# Patient Record
Sex: Male | Born: 1950
Health system: Southern US, Community
[De-identification: ages and names within clinical notes are randomized; demographics above are authoritative.]

## PROBLEM LIST (undated history)

## (undated) DIAGNOSIS — E785 Hyperlipidemia, unspecified: Secondary | ICD-10-CM

## (undated) DIAGNOSIS — G4733 Obstructive sleep apnea (adult) (pediatric): Secondary | ICD-10-CM

## (undated) DIAGNOSIS — Z9989 Dependence on other enabling machines and devices: Secondary | ICD-10-CM

## (undated) DIAGNOSIS — M5030 Other cervical disc degeneration, unspecified cervical region: Secondary | ICD-10-CM

## (undated) DIAGNOSIS — R739 Hyperglycemia, unspecified: Secondary | ICD-10-CM

## (undated) DIAGNOSIS — M1990 Unspecified osteoarthritis, unspecified site: Secondary | ICD-10-CM

## (undated) DIAGNOSIS — M48061 Spinal stenosis, lumbar region without neurogenic claudication: Secondary | ICD-10-CM

## (undated) DIAGNOSIS — I10 Essential (primary) hypertension: Secondary | ICD-10-CM

## (undated) DIAGNOSIS — R7303 Prediabetes: Secondary | ICD-10-CM

## (undated) HISTORY — DX: Unspecified osteoarthritis, unspecified site: M19.90

## (undated) HISTORY — DX: Hyperglycemia, unspecified: R73.9

## (undated) HISTORY — DX: Essential (primary) hypertension: I10

## (undated) HISTORY — DX: Spinal stenosis, lumbar region without neurogenic claudication: M48.061

## (undated) HISTORY — DX: Obstructive sleep apnea (adult) (pediatric): G47.33

## (undated) HISTORY — DX: Obstructive sleep apnea (adult) (pediatric): Z99.89

## (undated) HISTORY — DX: Hyperlipidemia, unspecified: E78.5

## (undated) HISTORY — DX: Other cervical disc degeneration, unspecified cervical region: M50.30

---
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
## 1995-08-17 HISTORY — PX: CT HEAD LIMITED W/CM: HXRAD128

## 1995-08-17 HISTORY — PX: OTHER SURGICAL HISTORY: SHX169

## 1996-08-16 HISTORY — PX: ESOPHAGOGASTRODUODENOSCOPY: SHX1529

## 1996-08-16 HISTORY — PX: COLONOSCOPY: SHX174

## 1998-08-16 HISTORY — PX: OTHER SURGICAL HISTORY: SHX169

## 2001-07-22 ENCOUNTER — Emergency Department (HOSPITAL_COMMUNITY): Admission: EM | Admit: 2001-07-22 | Discharge: 2001-07-22 | Payer: Self-pay | Admitting: Emergency Medicine

## 2004-04-16 ENCOUNTER — Encounter: Payer: Self-pay | Admitting: Family Medicine

## 2004-04-16 DIAGNOSIS — E118 Type 2 diabetes mellitus with unspecified complications: Secondary | ICD-10-CM

## 2004-04-16 DIAGNOSIS — R7303 Prediabetes: Secondary | ICD-10-CM | POA: Insufficient documentation

## 2004-04-16 DIAGNOSIS — E1169 Type 2 diabetes mellitus with other specified complication: Secondary | ICD-10-CM | POA: Insufficient documentation

## 2004-04-16 LAB — CONVERTED CEMR LAB: PSA: 0.5 ng/mL

## 2004-06-19 ENCOUNTER — Ambulatory Visit: Payer: Self-pay | Admitting: Family Medicine

## 2004-06-23 ENCOUNTER — Ambulatory Visit: Payer: Self-pay | Admitting: Family Medicine

## 2005-09-02 ENCOUNTER — Ambulatory Visit: Payer: Self-pay | Admitting: Family Medicine

## 2006-02-02 ENCOUNTER — Ambulatory Visit: Payer: Self-pay | Admitting: Family Medicine

## 2007-04-25 ENCOUNTER — Ambulatory Visit: Payer: Self-pay | Admitting: Family Medicine

## 2007-04-25 DIAGNOSIS — I10 Essential (primary) hypertension: Secondary | ICD-10-CM | POA: Insufficient documentation

## 2007-05-03 ENCOUNTER — Encounter: Payer: Self-pay | Admitting: Family Medicine

## 2007-05-04 DIAGNOSIS — R42 Dizziness and giddiness: Secondary | ICD-10-CM | POA: Insufficient documentation

## 2007-06-01 ENCOUNTER — Ambulatory Visit: Payer: Self-pay | Admitting: Family Medicine

## 2007-11-23 ENCOUNTER — Ambulatory Visit: Payer: Self-pay | Admitting: Family Medicine

## 2007-11-23 LAB — CONVERTED CEMR LAB
ALT: 36 units/L (ref 0–53)
AST: 28 units/L (ref 0–37)
Alkaline Phosphatase: 56 units/L (ref 39–117)
Basophils Absolute: 0 10*3/uL (ref 0.0–0.1)
Basophils Relative: 0 % (ref 0.0–1.0)
Bilirubin, Direct: 0.1 mg/dL (ref 0.0–0.3)
CO2: 30 meq/L (ref 19–32)
Chloride: 107 meq/L (ref 96–112)
Cholesterol: 170 mg/dL (ref 0–200)
LDL Cholesterol: 121 mg/dL — ABNORMAL HIGH (ref 0–99)
Lymphocytes Relative: 30.5 % (ref 12.0–46.0)
MCHC: 33.4 g/dL (ref 30.0–36.0)
Neutrophils Relative %: 51.7 % (ref 43.0–77.0)
Potassium: 4.3 meq/L (ref 3.5–5.1)
RBC: 4.65 M/uL (ref 4.22–5.81)
RDW: 12.5 % (ref 11.5–14.6)
Sodium: 142 meq/L (ref 135–145)
Total Bilirubin: 0.8 mg/dL (ref 0.3–1.2)
VLDL: 23 mg/dL (ref 0–40)

## 2007-11-27 ENCOUNTER — Ambulatory Visit: Payer: Self-pay | Admitting: Family Medicine

## 2008-05-02 ENCOUNTER — Ambulatory Visit: Payer: Self-pay | Admitting: Family Medicine

## 2008-09-03 ENCOUNTER — Ambulatory Visit: Payer: Self-pay | Admitting: Family Medicine

## 2008-09-03 DIAGNOSIS — M25579 Pain in unspecified ankle and joints of unspecified foot: Secondary | ICD-10-CM | POA: Insufficient documentation

## 2008-11-18 ENCOUNTER — Emergency Department (HOSPITAL_COMMUNITY): Admission: EM | Admit: 2008-11-18 | Discharge: 2008-11-18 | Payer: Self-pay | Admitting: Emergency Medicine

## 2008-11-18 ENCOUNTER — Encounter: Payer: Self-pay | Admitting: Family Medicine

## 2008-11-19 ENCOUNTER — Ambulatory Visit: Payer: Self-pay | Admitting: Family Medicine

## 2008-11-22 ENCOUNTER — Telehealth: Payer: Self-pay | Admitting: Family Medicine

## 2008-11-25 ENCOUNTER — Ambulatory Visit: Payer: Self-pay | Admitting: Family Medicine

## 2008-11-27 ENCOUNTER — Telehealth: Payer: Self-pay | Admitting: Family Medicine

## 2008-11-28 ENCOUNTER — Ambulatory Visit: Payer: Self-pay | Admitting: Family Medicine

## 2008-11-28 LAB — CONVERTED CEMR LAB: HDL goal, serum: 40 mg/dL

## 2008-12-03 ENCOUNTER — Encounter: Payer: Self-pay | Admitting: Family Medicine

## 2009-01-07 ENCOUNTER — Telehealth: Payer: Self-pay | Admitting: Family Medicine

## 2009-01-20 ENCOUNTER — Encounter: Admission: RE | Admit: 2009-01-20 | Discharge: 2009-04-20 | Payer: Self-pay | Admitting: Neurology

## 2009-03-03 ENCOUNTER — Ambulatory Visit: Payer: Self-pay | Admitting: Family Medicine

## 2009-04-01 ENCOUNTER — Encounter: Payer: Self-pay | Admitting: Family Medicine

## 2009-09-24 ENCOUNTER — Ambulatory Visit: Payer: Self-pay | Admitting: Family Medicine

## 2009-09-24 DIAGNOSIS — M109 Gout, unspecified: Secondary | ICD-10-CM | POA: Insufficient documentation

## 2009-09-26 ENCOUNTER — Encounter: Payer: Self-pay | Admitting: Family Medicine

## 2010-01-22 ENCOUNTER — Telehealth: Payer: Self-pay | Admitting: Family Medicine
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
## 2010-04-22 ENCOUNTER — Ambulatory Visit (HOSPITAL_BASED_OUTPATIENT_CLINIC_OR_DEPARTMENT_OTHER): Admission: RE | Admit: 2010-04-22 | Discharge: 2010-04-22 | Payer: Self-pay | Admitting: Otolaryngology

## 2010-04-25 ENCOUNTER — Ambulatory Visit: Payer: Self-pay | Admitting: Internal Medicine

## 2010-05-12 ENCOUNTER — Ambulatory Visit: Payer: Self-pay | Admitting: Internal Medicine

## 2010-05-12 DIAGNOSIS — R609 Edema, unspecified: Secondary | ICD-10-CM | POA: Insufficient documentation

## 2010-05-25 ENCOUNTER — Ambulatory Visit: Payer: Self-pay | Admitting: Internal Medicine

## 2010-05-25 ENCOUNTER — Encounter: Payer: Self-pay | Admitting: Family Medicine

## 2010-05-25 LAB — CONVERTED CEMR LAB
ALT: 40 units/L (ref 0–53)
Alkaline Phosphatase: 60 units/L (ref 39–117)
Bilirubin, Direct: 0.1 mg/dL (ref 0.0–0.3)
CO2: 28 meq/L (ref 19–32)
Chloride: 103 meq/L (ref 96–112)
Direct LDL: 108.4 mg/dL
Sodium: 140 meq/L (ref 135–145)
Total Bilirubin: 0.8 mg/dL (ref 0.3–1.2)
Total CHOL/HDL Ratio: 7
Total Protein: 6.7 g/dL (ref 6.0–8.3)
Triglycerides: 218 mg/dL — ABNORMAL HIGH (ref 0.0–149.0)
VLDL: 43.6 mg/dL — ABNORMAL HIGH (ref 0.0–40.0)

## 2010-05-26 ENCOUNTER — Ambulatory Visit: Payer: Self-pay | Admitting: Family Medicine

## 2010-06-15 ENCOUNTER — Ambulatory Visit: Payer: Self-pay | Admitting: Family Medicine

## 2010-06-15 ENCOUNTER — Telehealth: Payer: Self-pay | Admitting: Family Medicine

## 2010-06-15 LAB — CONVERTED CEMR LAB
Basophils Absolute: 0 10*3/uL (ref 0.0–0.1)
Basophils Relative: 0.5 % (ref 0.0–3.0)
Eosinophils Absolute: 0.2 10*3/uL (ref 0.0–0.7)
Lymphocytes Relative: 21.6 % (ref 12.0–46.0)
MCHC: 34.7 g/dL (ref 30.0–36.0)
Monocytes Absolute: 0.8 10*3/uL (ref 0.1–1.0)
Neutrophils Relative %: 62.3 % (ref 43.0–77.0)
Platelets: 190 10*3/uL (ref 150.0–400.0)
RBC: 4.39 M/uL (ref 4.22–5.81)
Uric Acid, Serum: 9.7 mg/dL — ABNORMAL HIGH (ref 4.0–7.8)
WBC: 6.8 10*3/uL (ref 4.5–10.5)

## 2010-06-16 HISTORY — PX: COLONOSCOPY: SHX174

## 2010-06-19 ENCOUNTER — Telehealth: Payer: Self-pay | Admitting: Family Medicine

## 2010-06-25 ENCOUNTER — Encounter: Payer: Self-pay | Admitting: Internal Medicine

## 2010-06-26 ENCOUNTER — Encounter (INDEPENDENT_AMBULATORY_CARE_PROVIDER_SITE_OTHER): Payer: Self-pay

## 2010-06-30 ENCOUNTER — Ambulatory Visit: Payer: Self-pay | Admitting: Internal Medicine

## 2010-07-14 ENCOUNTER — Ambulatory Visit: Payer: Self-pay | Admitting: Internal Medicine

## 2010-07-14 LAB — HM COLONOSCOPY

## 2010-07-27 ENCOUNTER — Telehealth: Payer: Self-pay | Admitting: Family Medicine
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
## 2010-09-15 NOTE — Progress Notes (Signed)
Summary: colcrys is causing diarrhea  Phone Note Call from Patient   Caller: Patient Call For:   Summary of Call: Pt states he has had diarrhea for the last few days, thinks caused by colcrys.  He has not had this in last 2 days but was up 4 times last night with diarrhea.  Please advise on what he should do.  Gout flare up is better, but still there.  [facility]. Initial call taken by: Lowella Petties CMA, AAMA,  June 19, 2010 10:57 AM  Follow-up for Phone Call        likely from colchicine - can cause GI upset.  would recommend back off.  stop colchicine if able to, otherwise if still with gout flare would recommend back down to daily colchicine.  could try immodium for diarrhea as long as no blood.  back off colchicine.  ensure stays well hydrated.  called and left message to call us back. Follow-up by: [name]  MD,  June 19, 2010 11:07 AM  Additional Follow-up for Phone Call Additional follow up Details #1::        Spoke with patient. He will back down to 1 pill once daily over the weekend due to still having some redness and swelling and then stop altogether on Monday. He was instructed to take immodium if needed and to stay well hydrated. He said he has not noticed any blood in stool.  Additional Follow-up by: [name] CMA Duncan Dull),  June 19, 2010 3:13 PM

## 2010-09-15 NOTE — Consult Note (Signed)
Summary: Ronald Bullock,DPM,[facility],Note  Ronald Bullock,DPM,[facility],Note   Imported By: Beau Fanny 10/01/2009 16:12:46  _____________________________________________________________________  External Attachment:    Type:   Image     Comment:   External Document
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
## 2010-09-15 NOTE — Assessment & Plan Note (Signed)
Summary: HURT FOOT, BAD PAIN/JRR   Vital Signs:  Patient profile:   60 year old male Weight:      250.50 pounds Temp:     97.8 degrees F oral Pulse rate:   88 / minute Pulse rhythm:   regular BP sitting:   134 / 70  (left arm) Cuff size:   large  Vitals Entered By: Selena Batten Dance CMA (AAMA) (June 15, 2010 9:02 AM) CC: Left foot pain   History of Present Illness: CC: L foot pain  3wk h/o L foot pain.  Sore all over.  pain concentrated at head of 1st MT.  + severe pain even to touch, heat, swelling.  Voltaren gel sometimes helps, not often.  Elevating leg doesn't help.  Last night was first night couldn't sleep.  When walking, walks on heel or side of foot, this causing knee and hip to hurt as well.  taking advil which helps some but ankles swell.  Tylenol doesn't help.  Denies injury, trauma.  no fevers/chills.  Has had foot problems in past - needed injections in both feet in past by podiatrist in [place] and another in [place].  Injections helped.  Saw Dr. Patsy Lager who [facility] feet and told bursitis in past, prescribed voltaren gel.  h/o gout 12 years ago.  In R hand and R foot.  h/o bad feet.  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  no ankle pain.  + loss of longitudinal arch, some loss of  transverse arch, 2nd toe very straight and stiff Pulses:  2+ DP/PT pulses Extremities:  minimal edema at ankles   Impression & Recommendations:  Problem # 1:  FOOT PAIN, LEFT, PLANTAR MTP AREA (ICD-729.5) suspicious for podagra although duration of pain points against this.  treat as such with colchicine and NSAIDs, ice, check uric acid level, CBC.  RTC 1 wk, sooner if not improving.  To call if continued pain despite colchicine, would consider vicodin script and referral to podiatry.  Orders: TLB-Uric Acid, Blood (84550-URIC) TLB-CBC Platelet - w/Differential (85025-CBCD)  Complete Medication List: 1)  Omega 3-6-9/fish Oil/flax Seed 1200 Mg  .... 2 by mouth once daily 2)  Tylenol Extra Strength 500 Mg Tabs (Acetaminophen) .... 2 by mouth three times a day 3)  Ibuprofen 800 Mg Tabs (Ibuprofen) .... Take one three times a day x 3 days then as needed 4)  Colcrys 0.6 Mg Tabs (Colchicine) .... Take as directed, may take 2 at first  Patient Instructions: 1)  For foot - ice foot at leat 3 times a day, max 20 min a day. 2)  Take colchicine 2 pills first then one every 6 hours as needed for pain. 3)  Take ibuprofen 800mg  tid for next few days then as needed.  continue voltaren.  4)  Return in 1-2 weeks for  follow up.  call us sooner if not helping. Prescriptions: COLCRYS 0.6 MG TABS (COLCHICINE) take as directed, may take 2 at first  #30 x 0   Entered and Authorized by:   [name]  MD   Signed by:   [name]  MD on 06/15/2010   Method used:   [facility]        [id number]       [address]       [place], [place]  [address]       Ph: [phone] or [phone]       Fax: [phone]   RxID:   [id number] IBUPROFEN 800 MG TABS (IBUPROFEN) take one three times a day x 3 days then as needed  #30 x 0   Entered and Authorized by:   [name]  MD   Signed by:   [name]  MD on 06/15/2010   Method used:   [facility]         [id number]       [address]       [place], [place]  [address]       Ph: [phone] or [phone]       Fax: [phone]   RxID:   [id number]    Orders Added: 1)  TLB-Uric Acid, Blood [84550-URIC] 2)  TLB-CBC Platelet - w/Differential [85025-CBCD] 3)  Est. Patient Level III [42706]    Current Allergies (reviewed today): No known allergies

## 2010-09-15 NOTE — Assessment & Plan Note (Signed)
Summary: CPX/W LABS/[facility]   Vital Signs:  Patient profile:   60 year old male Weight:      246.75 pounds Temp:     98.4 degrees F oral Pulse rate:   78 / minute Pulse rhythm:   regular BP sitting:   130 / 80  (left arm) Cuff size:   large  Vitals Entered By: Selena Batten Dance CMA Duncan Dull) (May 25, 2010 8:32 AM) CC: CPx, Lipid Management   History of Present Illness: CC: CPE  ankle swelling improving when changed from ibuprofen/advil to tylenol.  hip pain and knee pain not as well controlled on tylenol.  h/o arthritis in past, mainly in hip and knee.  to get replacement, awaiting for time and mony for surgery, also considering changing ortho - currently sees Dr. Yisroel Ramming.  declines other pain med for the time being.  down 9 lbs since last visit since change off ibuprofen and watching salt, more water.  had sleep study, found to have sleep apnea.  to have company come home to do mask fitting.  UTD tetanus, declines flu.  requests iFOB today.  s/p colonoscopy  ~1998.  prostate WNL, last checked 2009, strong stream, no nocturia.  Lipid Management History:      Positive NCEP/ATP III risk factors include male age 36 years old or older and HDL cholesterol less than 40.  Negative NCEP/ATP III risk factors include non-diabetic, no family history for ischemic heart disease, non-tobacco-user status, non-hypertensive, no ASHD (atherosclerotic heart disease), no prior stroke/TIA, no peripheral vascular disease, and no history of aortic aneurysm.    -  Date:  11/18/2008    TD booster Td  Current Medications (verified): 1)  Omega 3-6-9/fish Oil/flax Seed 1200 Mg .... 2 By Mouth Once Daily 2)  Tylenol Extra Strength 500 Mg Tabs (Acetaminophen) .... 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 ( Lives with wife)  4 children  Past Medical History: Hyperlipidemia (low HDL) bad arthritis (L knee cartilage gone) (cracked R hip 2006) OSA  Past Surgical History: arthroscopy L knee, cartilage removal 1968 episodes of dizzyness/lightheadedness 2/97 episodes CP nml cardiolyte head CT nml 4/97 cardiolite nml x/ increased BP 6/97 carotid US nml 9/97 endoscopy gastric polyps benign 9/98 colonoscopy nml 9/98 MRI l/s mild bulge L3-4, mild foraminal narrowing 4-5, L5-S1small disc herniation 05/14/99 (x-rays knees R) hip L/S degen changes throughout 02/02/06 PMH-FH-SH reviewed for relevance  Social History: No smoking, rare EtOH, no rec drugs Occupation: Music therapist, [employer]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  no bruits Lungs:  Normal respiratory effort, chest expands symmetrically. Lungs are clear to auscultation, no crackles or wheezes. Heart:  Normal rate and regular rhythm. S1 and S2 normal without gallop, murmur, click, rub or other extra sounds. Abdomen:  Bowel sounds positive,abdomen soft and non-tender without masses, organomegaly or hernias noted. Rectal:  No external abnormalities noted. Normal sphincter tone. No rectal masses or tenderness.  guaiac neg Prostate:  Prostate gland firm and smooth, no enlargement, nodularity, tenderness, mass, asymmetry or induration. 30gm Pulses:  2+ radial  pulses Extremities:  improved edema, still mild pitting   Impression & Recommendations:  Problem # 1:  HEALTH MAINTENANCE EXAM (ICD-V70.0) Reviewed preventive care protocols, scheduled due services, and updated immunizations.  declines flu shot.  utd tetanus.  iFOB and  PSA today.  Problem # 2:  SPECIAL SCREENING MALIGNANT NEOPLASM OF PROSTATE (ICD-V76.44) DRE reassuring, await PSA.  Orders: TLB-PSA (Prostate Specific Antigen) (84153-PSA)  Problem # 3:  HYPERLIPIDEMIA (ICD-272.4) goal LDL would be 130.  recheck.  h/o low HDL.  on fish oil.  Orders: TLB-BMP (Basic Metabolic Panel-BMET) (80048-METABOL) TLB-Hepatic/Liver Function Pnl (80076-HEPATIC) TLB-Lipid Panel (80061-LIPID)  Labs Reviewed: SGOT: 28 (11/23/2007)   SGPT: 36 (11/23/2007)  Lipid Goals: Chol Goal: 200 (11/28/2008)   HDL Goal: 40 (11/28/2008)   LDL Goal: 130 (11/28/2008)   TG Goal: 150 (11/28/2008)  Prior 10 Yr Risk Heart Disease: 18 % (11/28/2008)   HDL:26.3 (11/23/2007)  LDL:121 (11/23/2007)  Chol:170 (11/23/2007)  Trig:116 (11/23/2007)  Problem # 4:  HYPERGLYCEMIA (ICD-790.29) check glu fasting today.  stable.  Labs Reviewed: Creat: 1.2 (11/23/2007)     Problem # 5:  PERIPHERAL EDEMA (ICD-782.3) improved since decreased salt, actually lost 9 lbs.  congratulated.  Problem # 6:  ELEVATED BLOOD PRESSURE WITHOUT DIAGNOSIS OF HYPERTENSION (ICD-796.2) improved since off NSAIDs and watching salt.  BP today: 130/80 Prior BP: 142/80 (05/12/2010)  Prior 10 Yr Risk Heart Disease: 18 % (11/28/2008)  Labs Reviewed: Creat: 1.2 (11/23/2007) Chol: 170 (11/23/2007)   HDL: 26.3 (11/23/2007)   LDL: 121 (11/23/2007)   TG: 116 (11/23/2007)  Instructed in low sodium diet (DASH Handout) and behavior modification.    Complete Medication List: 1)  Omega 3-6-9/fish Oil/flax Seed 1200 Mg  .... 2 by mouth once daily 2)  Tylenol Extra Strength 500 Mg Tabs (Acetaminophen) .... 2 by mouth three times a day  Other Orders: Hemoccult Guaiac-1 spec.(in office) (82270)  Lipid Assessment/Plan:      Based on NCEP/ATP III, the patient's risk factor category is "2 or more risk factors and a calculated 10 year CAD risk of < 20%".  The patient's lipid goals are as follows: Total cholesterol goal is  200; LDL cholesterol goal is 130; HDL cholesterol goal is 40; Triglyceride goal is 150.  His LDL cholesterol goal has been met.     Patient Instructions: 1)  stool study sent home today. 2)  blood work today. 3)  Call clinic with questions.  Good to see you today.  Current Allergies (reviewed today): No known allergies    Prevention & Chronic Care Immunizations   Influenza vaccine: Not documented   Influenza vaccine deferral: Refused  (05/25/2010)    Tetanus booster: 11/18/2008: Td    Pneumococcal vaccine: Not documented    H. zoster vaccine: Not documented  Colorectal Screening   Hemoccult: Negative  (04/16/2004)    Colonoscopy: Not documented  Other Screening   PSA: 0.61  (11/23/2007)   PSA ordered.   Smoking status: never  (11/28/2008)  Lipids   Total Cholesterol: 170  (11/23/2007)   LDL: 121  (11/23/2007)   LDL Direct: Not documented   HDL: 26.3  (11/23/2007)   Triglycerides: 116  (11/23/2007)    SGOT (AST): 28  (11/23/2007)   SGPT (ALT): 36  (11/23/2007)   Alkaline phosphatase: 56  (11/23/2007)   Total bilirubin: 0.8  (11/23/2007)  Self-Management Support :    Lipid self-management support: Not documented

## 2010-09-15 NOTE — Progress Notes (Signed)
Summary: ? About medication  Phone Note Call from Patient Call back at [phone]   Caller: Patient Call For: Ronald Boyden  MD Summary of Call: Patient was in earlier today. Patient wants to know if he is to continue to take the Tylenol along with the  medications that he was given today? Initial call taken by: Sydell Axon LPN,  June 15, 2010 10:34 AM  Follow-up for Phone Call        yes he can take tylenol with ibuprofen and colchicine Follow-up by: Ronald Boyden  MD,  June 15, 2010 11:12 AM  Additional Follow-up for Phone Call Additional follow up Details #1::        PAtient notified Additional Follow-up by: [name] CMA Duncan Dull),  June 15, 2010 11:55 AM

## 2010-09-15 NOTE — Miscellaneous (Signed)
Summary: Lec previsit  Clinical Lists Changes  Medications: Added new medication of MOVIPREP 100 GM  SOLR (PEG-KCL-NACL-NASULF-NA ASC-C) As per prep instructions. - Signed Rx of MOVIPREP 100 GM  SOLR (PEG-KCL-NACL-NASULF-NA ASC-C) As per prep instructions.;  #1 x 0;  Signed;  Entered by: Ulis Rias RN;  Authorized by: Hilarie Fredrickson MD;  Method used: [facility] [place], [place]  [address], Ph: [phone] or [phone], Fax: [phone] Observations: Added new observation of NKA: T (06/30/2010 9:39)    Prescriptions: MOVIPREP 100 GM  SOLR (PEG-KCL-NACL-NASULF-NA ASC-C) As per prep instructions.  #1 x 0   Entered by:   Ulis Rias RN   Authorized by:   Hilarie Fredrickson MD   Signed by:   Ulis Rias RN on 06/30/2010   Method used:   [facility]        [facility] (retail)       [address]       [place], [place]  [address]       Ph: [phone] or [phone]       Fax: [phone]   RxID:   [id number]
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
## 2010-09-15 NOTE — Letter (Signed)
Summary: Moviprep Instructions  [facility]  [address]   [place], [place] [address]   Phone: [phone]  Fax: [phone]       Ronald Bullock    [birth year]    MRN: [id number]        Procedure Day Dorna Bloom: Tuesday, 07-14-10     Arrival Time: 9:00 a.m.     Procedure Time: 10:00 a.m.     Location of Procedure:                    x   [facility] (4th Floor)                        PREPARATION FOR COLONOSCOPY WITH MOVIPREP   Starting 5 days prior to your procedure 07-09-10 do not eat nuts, seeds, popcorn, corn, beans, peas,  salads, or any raw vegetables.  Do not take any fiber supplements (e.g. Metamucil, Citrucel, and Benefiber).  THE DAY BEFORE YOUR PROCEDURE         DATE: 07-13-10  DAY: Monday  1.  Drink clear liquids the entire day-NO SOLID FOOD  2.  Do not drink anything colored red or purple.  Avoid juices with pulp.  No orange juice.  3.  Drink at least 64 oz. (8 glasses) of fluid/clear liquids during the day to prevent dehydration and help the prep work efficiently.  CLEAR LIQUIDS INCLUDE: Water Jello Ice Popsicles Tea (sugar ok, no milk/cream) Powdered fruit flavored drinks Coffee (sugar ok, no milk/cream) Gatorade Juice: apple, white grape, white cranberry  Lemonade Clear bullion, consomm, broth Carbonated beverages (any kind) Strained chicken noodle soup Hard Candy                             4.  In the morning, mix first dose of MoviPrep solution:    Empty 1 Pouch A and 1 Pouch B into the disposable container    Add lukewarm drinking water to the top line of the container. Mix to dissolve    Refrigerate (mixed solution should be used within 24 hrs)  5.  Begin drinking the prep at 5:00 p.m. The MoviPrep container is divided by 4 marks.   Every 15 minutes drink the solution down to the next mark (approximately 8 oz) until the full liter is complete.   6.  Follow completed prep with 16 oz of clear liquid of your  choice (Nothing red or purple).  Continue to drink clear liquids until bedtime.  7.  Before going to bed, mix second dose of MoviPrep solution:    Empty 1 Pouch A and 1 Pouch B into the disposable container    Add lukewarm drinking water to the top line of the container. Mix to dissolve    Refrigerate  THE DAY OF YOUR PROCEDURE      DATE: 07-14-10  DAY: Tuesday  Beginning at 5:00 a.m. (5 hours before procedure):         1. Every 15 minutes, drink the solution down to the next mark (approx 8 oz) until the full liter is complete.  2. Follow completed prep with 16 oz. of clear liquid of your choice.    3. You may drink clear liquids until  8:00 a.m.  (2 HOURS BEFORE PROCEDURE).   MEDICATION INSTRUCTIONS  Unless otherwise instructed, you should take regular prescription medications with a small sip of water   as early as possible  the morning of your procedure.         OTHER INSTRUCTIONS  You will need a responsible adult at least 62 years of age to accompany you and drive you home.   This person must remain in the waiting room during your procedure.  Wear loose fitting clothing that is easily removed.  Leave jewelry and other valuables at home.  However, you may wish to bring a book to read or  an iPod/MP3 player to listen to music as you wait for your procedure to start.  Remove all body piercing jewelry and leave at home.  Total time from sign-in until discharge is approximately 2-3 hours.  You should go home directly after your procedure and rest.  You can resume normal activities the  day after your procedure.  The day of your procedure you should not:   Drive   Make legal decisions   Operate machinery   Drink alcohol   Return to work  You will receive specific instructions about eating, activities and medications before you leave.    The above instructions have been reviewed and explained to me by   Ulis Rias RN  June 30, 2010 10:12 AM     I  fully understand and can verbalize these instructions _____________________________ Date _________

## 2010-09-15 NOTE — Letter (Signed)
Summary: Pre Visit Letter Revised  [facility]  [address] [place], [place] [address]   Phone: [phone]  Fax: [phone]        06/25/2010 MRN: [id number]  Ronald Bullock [address] [place], [place]  [address]             Procedure Date:  11-29 at 10am           Dr Justin Mend to the Gastroenterology Division at [facility].    You are scheduled to see a nurse for your pre-procedure visit on 06-30-10 at 8am on the 3rd floor at [facility], [address]  We ask that you try to arrive at our office 15 minutes prior to your appointment time to allow for check-in.  Please take a minute to review the attached form.  If you answer "Yes" to one or more of the questions on the first page, we ask that you call the person listed at your earliest opportunity.  If you answer "No" to all of the questions, please complete the rest of the form and bring it to your appointment.    Your nurse visit will consist of discussing your medical and surgical history, your immediate family medical history, and your medications.   If you are unable to list all of your medications on the form, please bring the medication bottles to your appointment and we will list them.  We will need to be aware of both prescribed and over the counter drugs.  We will need to know exact dosage information as well.    Please be prepared to read and sign documents such as consent forms, a financial agreement, and acknowledgement forms.  If necessary, and with your consent, a friend or relative is welcome to sit-in on the nurse visit with you.  Please bring your insurance card so that we may make a copy of it.  If your insurance requires a referral to see a specialist, please bring your referral form from your primary care physician.  No co-pay is required for this nurse visit.     If you cannot keep your appointment, please call [phone] to cancel or reschedule prior to your  appointment date.  This allows [place] the opportunity to schedule an appointment for another patient in need of care.    Thank you for choosing [facility] for your medical needs.  We appreciate the opportunity to care for you.  Please visit [place] at our website  to learn more about our practice.  Sincerely, The Gastroenterology Division

## 2010-09-15 NOTE — Letter (Signed)
Summary: [facility]: Immunoassay Fecal Occult Blood (iFOB) Order Form  [facility]  [address] [place], [place] [address]   Phone: [phone]  Fax: [phone]      [facility]: Immunoassay Fecal Occult Blood (iFOB) Order Form   May 25, 2010 MRN: [id number]   Ronald Bullock Sep 02, 1950   Physicican Name:_________________________  Diagnosis Code:_______V76.49___________________      [name]  MD
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
## 2010-09-15 NOTE — Procedures (Signed)
Summary: Colonoscopy = WNL, rpt 10 years  Patient: Ronald Bullock Note: All result statuses are Final unless otherwise noted.  Tests: (1) Colonoscopy (COL)   COL Colonoscopy           DONE     [facility]     [address]     [place], [place]  [address]           COLONOSCOPY PROCEDURE REPORT           PATIENT:  [name], [name]  MR#:  [id number]     BIRTHDATE:  Dec 19, 1950, 58 yrs. old  GENDER:  male     ENDOSCOPIST:  Wilhemina Bonito. Eda Keys, MD     REF. BY:  [name], M.D.     PROCEDURE DATE:  07/14/2010     PROCEDURE:  Average-risk screening colonoscopy     [id number]     ASA CLASS:  Class II     INDICATIONS:  screening, heme positive stool ; NORMAL COLONOSCOPY     1998 (RK), NORMAL HG 06-15-10 = 13.9     MEDICATIONS:   Fentanyl 75 mcg IV, Versed 8 mg IV           DESCRIPTION OF PROCEDURE:   After the risks benefits and     alternatives of the procedure were thoroughly explained, informed     consent was obtained.  Digital rectal exam was performed and     revealed no abnormalities.   The LB 180AL [id number] endoscope was     introduced through the anus and advanced to the cecum, which was     identified by both the appendix and ileocecal valve, without     limitations.Time to cecum = 2:32 min.  The quality of the prep was     excellent, using Nulytley.  The instrument was then slowly     withdrawn (time = 10:36 min) as the colon was fully examined.     <<PROCEDUREIMAGES>>           FINDINGS:  A normal appearing cecum, ileocecal valve, and     appendiceal orifice were identified. The ascending, hepatic     flexure, transverse, splenic flexure, descending, sigmoid colon,     and rectum appeared unremarkable.  No polyps or cancers were seen.     Retroflexed views in the rectum revealed internal hemorrhoids.     The scope was then withdrawn from the patient and the procedure     completed.           COMPLICATIONS:  None     ENDOSCOPIC IMPRESSION:     1) Normal  colon     2) No polyps or cancers     3) Internal hemorrhoids     RECOMMENDATIONS:     1) Continue current colorectal screening recommendations for     "routine risk" patients with a repeat colonoscopy in 10 years.     2) Return to the care of Dr. Sharen Hones           ______________________________     Wilhemina Bonito. Eda Keys, MD           CC:  [name] MD; The Patient           n.     eSIGNED:   Wilhemina Bonito. Eda Keys at 07/14/2010 10:46 AM           Arley Phenix, [id number]  Note: An exclamation mark (!) indicates a result that was not dispersed  into the flowsheet. Document Creation Date: 07/14/2010 10:46 AM _______________________________________________________________________  (1) Order result status: Final Collection or observation date-time: 07/14/2010 10:39 Requested date-time:  Receipt date-time:  Reported date-time:  Referring Physician:   Ordering Physician: Fransico Setters [id number]) Specimen Source:  Source: Launa Grill Order Number: [id number] Lab site:   Appended Document: Colonoscopy    Clinical Lists Changes  Observations: Added new observation of COLONNXTDUE: 06/2020 (07/14/2010 13:26)

## 2010-09-17 NOTE — Progress Notes (Signed)
Summary: should pt continue mobic  Phone Note Call from Patient Call back at Home Phone [phone]   Caller: Patient Call For: Ronald Boyden  MD Summary of Call: Pt was told to call you after he had his colonoscopy.  He has finished colchicine and is almost finished with mobic.  He says the mobic has helped the pain in his knees and hips.  He is asking if you want him to continue with the mobic.  Uses [facility]. Initial call taken by: Lowella Petties CMA, AAMA,  July 27, 2010 3:44 PM  Follow-up for Phone Call        may continue mobic, try to use sparingly if possible.  will need to monitor blood pressure and swelling. Follow-up by: Ronald Boyden  MD,  July 27, 2010 4:59 PM  Additional Follow-up for Phone Call Additional follow up Details #1::        Left detailed message notifying patient. Advised him of refills sent to pharmacy. Instructed him to call with any questions. Additional Follow-up by: [name] CMA Duncan Dull),  July 28, 2010 8:13 AM    New/Updated Medications: MOBIC 15 MG TABS (MELOXICAM) take one daily as needed pain, take with food Prescriptions: MOBIC 15 MG TABS (MELOXICAM) take one daily as needed pain, take with food  #30 x 1   Entered and Authorized by:   Ronald Boyden  MD   Signed by:   Ronald Boyden  MD on 07/27/2010   Method used:   [facility]  [address] (retail)       [address]       [place], [place]  [address]       Ph: [phone] or [phone]       Fax: [phone]   RxID:   [id number]

## 2010-09-30 ENCOUNTER — Ambulatory Visit (INDEPENDENT_AMBULATORY_CARE_PROVIDER_SITE_OTHER): Payer: BC Managed Care – PPO | Admitting: Family Medicine

## 2010-09-30 ENCOUNTER — Encounter: Payer: Self-pay | Admitting: Family Medicine

## 2010-09-30 DIAGNOSIS — E663 Overweight: Secondary | ICD-10-CM

## 2010-09-30 DIAGNOSIS — R030 Elevated blood-pressure reading, without diagnosis of hypertension: Secondary | ICD-10-CM

## 2010-09-30 DIAGNOSIS — E669 Obesity, unspecified: Secondary | ICD-10-CM | POA: Insufficient documentation

## 2010-09-30 DIAGNOSIS — R609 Edema, unspecified: Secondary | ICD-10-CM

## 2010-09-30 DIAGNOSIS — M109 Gout, unspecified: Secondary | ICD-10-CM

## 2010-10-07 NOTE — Assessment & Plan Note (Signed)
Summary: 3 month f/u LFW   Vital Signs:  Patient profile:   60 year old male Weight:      252.25 pounds Temp:     99.0 degrees F oral Pulse rate:   72 / minute Pulse rhythm:   regular BP sitting:   148 / 80  (left arm) Cuff size:   large  Vitals Entered By: Selena Batten Dance CMA (AAMA) (September 30, 2010 8:25 AM) CC: 3 month follow up   History of Present Illness: CC: 3 mo f/u  1. HTN - staying elevated.  swelling better but still present.  no HA, vision changes, chest pain, tightness, urinary changes.  off ibuprofen but taking mobic intermittently.  2. obesity - weight up 6 lbs. trying to watch diet.  walking on treadmill.  not drinking much water.    3. hip and ankle/knee pains - no gout problems since finishing medicine in January.  still having pain (h/o L knee arthriits "bone on bone" and R hip cracked), but feels manageable on mobic.  also uses tylenol sparingly.  cortisone shot didn't help knee in past.  considering second opinion for L knee surgery.  4. stye - noticed for last 1-2 days.  using warm compresses for this.  5. OSA - on CPAP nightly.  feeling better on this and was hoping for weight loss but hasn't noticed.  Current Medications (verified): 1)  Omega 3-6-9/fish Oil/flax Seed 1200 Mg .... 2 By Mouth Once Daily 2)  Tylenol Extra Strength 500 Mg Tabs (Acetaminophen) .... 2 By Mouth Three Times A Day 3)  Mobic 15 Mg Tabs (Meloxicam) .... 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  Teeth in good repair. Neck:  No deformities, masses, or tenderness noted.  no bruits Lungs:  Normal respiratory effort, chest expands symmetrically. Lungs are clear to auscultation, no crackles or wheezes. Heart:  Normal rate and regular rhythm. S1 and S2 normal without gallop, murmur, click, rub or other extra sounds. Abdomen:  Bowel sounds positive,abdomen soft and non-tender without masses, organomegaly or hernias noted.  no abd/renal bruits Pulses:  2+ DP/PT pulses Extremities:  mild edema Skin:  Intact without suspicious lesions or rashes   Impression & Recommendations:  Problem # 1:  ELEVATED BLOOD PRESSURE WITHOUT DIAGNOSIS OF HYPERTENSION (ICD-796.2) likely does have HTN - held off on BP meds for now.  self proclaimed white coat hypertensive.  advised to update me if running high at home for consideration of new med (no HCTZ given h/o gout).  Problem # 2:  PERIPHERAL EDEMA (ICD-782.3) likely weight gain and less water contributing.  Problem # 3:  OVERWEIGHT (ICD-278.02) wt up, discussed healthy eating.  Ht: 71.75 (06/30/2010)   Wt: 252.25 (09/30/2010)   BMI: 33.96 (06/30/2010)  Problem # 4:  SPECIAL SCREENING FOR MALIGNANT NEOPLASMS COLON (ICD-V76.51) colonoscopy 2011 WNL after positive hemoccult.  rec rpt  10 years  Problem # 5:  GOUT, UNSPECIFIED (ICD-274.9)  stable off urate lowering meds.  His updated medication list for this problem includes:    Mobic 15 Mg Tabs (Meloxicam) .Marland Kitchen... Take one daily as needed pain, take with food  Problem # 6:  STYE, INTERNAL (ICD-373.12) warm compresses.  Complete Medication List: 1)  Omega 3-6-9/fish Oil/flax Seed 1200 Mg  .... 2 by mouth once daily 2)  Tylenol Extra Strength 500 Mg Tabs (Acetaminophen) .... 2 by mouth three times a day 3)  Mobic 15 Mg Tabs (Meloxicam) .... Take  one daily as needed pain, take with food  Patient Instructions: 1)  Increase water daily to help with stools. 2)  Call Dr. Yisroel Ramming to set up appointment to evaluate the L knee and R hip. 3)  Good to see you today, return in 6 months.  Keep an eye on blood pressure, if staying elevated >140/80 let us know.   Orders Added: 1)  Est. Patient Level IV [[address]]    Current Allergies (reviewed today): No known allergies

## 2010-10-21 ENCOUNTER — Telehealth: Payer: Self-pay | Admitting: Family Medicine

## 2010-10-23 ENCOUNTER — Encounter: Payer: Self-pay | Admitting: Family Medicine

## 2010-10-23 ENCOUNTER — Ambulatory Visit (INDEPENDENT_AMBULATORY_CARE_PROVIDER_SITE_OTHER): Payer: BC Managed Care – PPO | Admitting: Family Medicine

## 2010-10-23 DIAGNOSIS — I10 Essential (primary) hypertension: Secondary | ICD-10-CM

## 2010-10-27 NOTE — Assessment & Plan Note (Signed)
Summary: F/U ON B/P MEDICATION   Vital Signs:  Patient profile:   60 year old male Weight:      248.50 pounds Temp:     98.4 degrees F oral Pulse rate:   68 / minute Pulse rhythm:   regular BP sitting:   128 / 80  (left arm) Cuff size:   large  Vitals Entered By: Selena Batten Dance CMA Duncan Dull) (October 23, 2010 8:42 AM) CC: Followup on BP med/?HTN   History of Present Illness: CC: HTN  BP elevated recently, brings log with bp ranging from 140-179/80-104.    Pulse staying low 60s.   Notices pain and pork and salted cashews increase blood pressure.  bp better today, but did take lisinopril (started a few days ago).  Talked with ortho doctor - looking at November 2012 for possible replacement.  continue L hip pain, needs replaced, takes mobic.  no HA, vision changes, chest pain, tightness, urinary changes, LE swelling.    Current Medications (verified): 1)  Omega 3-6-9/fish Oil/flax Seed 1200 Mg .... 2 By Mouth Once Daily 2)  Tylenol Extra Strength 500 Mg Tabs (Acetaminophen) .... 2 By Mouth Three Times A Day 3)  Mobic 15 Mg Tabs (Meloxicam) .... Take One Daily As Needed Pain, Take With Food 4)  Lisinopril 10 Mg Tabs (Lisinopril) .... Take One Tabe By Mouth Daily  Allergies (verified): No Known Drug Allergies  Past History:  Past Medical History: Last updated: 06/30/2010 Hyperlipidemia (low HDL) bad arthritis (L knee cartilage gone) (cracked R hip 2006) OSA Gout hyperglycemia  Past Surgical History: Last updated: 05/25/2010 arthroscopy L knee, cartilage removal 1968 episodes of dizzyness/lightheadedness 2/97 episodes CP nml cardiolyte head CT nml 4/97 cardiolite nml x/ increased BP 6/97 carotid US nml 9/97 endoscopy gastric polyps benign 9/98 colonoscopy nml 9/98 MRI l/s mild bulge L3-4, mild foraminal narrowing 4-5, L5-S1small disc herniation 05/14/99 (x-rays knees R) hip L/S degen changes throughout 02/02/06  Social History: Last updated: 05/25/2010 No smoking, rare  EtOH, no rec drugs Occupation: Music therapist, [employer] in [employer] Married Estranged ( Lives with wife)  4 children PMH-FH-SH reviewed for relevance  Review of Systems       per HPI  Physical Exam  General:  WDWN, NAD Mouth:  Oral mucosa and oropharynx without lesions or exudates.  Teeth in good repair. Neck:  No deformities, masses, or tenderness noted.  no bruits Lungs:  Normal respiratory effort, chest expands symmetrically. Lungs are clear to auscultation, no crackles or wheezes. Heart:  Normal rate and regular rhythm. S1 and S2 normal without gallop, murmur, click, rub or other extra sounds. Abdomen:  Bowel sounds positive,abdomen soft and non-tender without masses, organomegaly or hernias noted.  no abd/renal bruits Pulses:  2+ DP/PT pulses Extremities:  no pedal edema   Impression & Recommendations:  Problem # 1:  HYPERTENSION, ESSENTIAL (ICD-401.9) start ACEI.  return in 2-3 wks for Cr and 1 mo for f/u.  discussed dietary changes including low sodium, increased potassium and water.  discussed stay away fro pork as much as able.  states does get good vegetables in.  His updated medication list for this problem includes:    Lisinopril 10 Mg Tabs (Lisinopril) .Marland Kitchen... Take one tabe by mouth daily  BP today: 128/80 Prior BP: 148/80 (09/30/2010)  Prior 10 Yr Risk Heart Disease: 18 % (11/28/2008)  Labs Reviewed: K+: 4.7 (05/25/2010) Creat: : 1.1 (05/25/2010)   Chol: 171 (05/25/2010)   HDL: 26.00 (05/25/2010)   LDL: 121 (  11/23/2007)   TG: 218.0 (05/25/2010)  Complete Medication List: 1)  Omega 3-6-9/fish Oil/flax Seed 1200 Mg  .... 2 by mouth once daily 2)  Tylenol Extra Strength 500 Mg Tabs (Acetaminophen) .... 2 by mouth three times a day 3)  Mobic 15 Mg Tabs (Meloxicam) .... Take one daily as needed pain, take with food 4)  Lisinopril 10 Mg Tabs (Lisinopril) .... Take one tabe by mouth daily  Patient Instructions: 1)  Ok to take Mobic when need  to. 2)  Pork is high in sodium/salt.  pain will also raise blood pressure. 3)  minimize salt (goal 1500mg /day) and continue to push water. 4)  Continue blood pressure medicine.  return in 2-3 weeks for blood work [BMP, 401.9]. 5)  Good job with weight! 6)  Return to see me in 1 month for follow up. Prescriptions: MOBIC 15 MG TABS (MELOXICAM) take one daily as needed pain, take with food  #30 x 3   Entered and Authorized by:   [name]  MD   Signed by:   [name]  MD on 10/23/2010   Method used:   [facility]        [facility]  [address] [id number]       [address]       [place], [place]  [address]       Ph: [phone] or [phone]       Fax: [phone]   RxID:   [id number]    Orders Added: 1)  Est. Patient Level III [36644]    Current Allergies (reviewed today): No known allergies

## 2010-10-27 NOTE — Progress Notes (Signed)
Summary: elevated blood pressure   Phone Note Call from Patient Call back at Home Phone [phone]   Caller: Patient Call For: Ronald Boyden  MD / Dr. Milinda Antis  Summary of Call: Patient is out of town for work and will not be back in until tomorrow afternoon. He says that his BP has been elevated for the past three days or so. He feels completely fine, has not feld dizzy, no headache, etc. Today when he [facility] where [facility] it his BP was 170/100. He has scheduled an appt to see DR. Doryce Mcgregory for Firday. He also says that if something needs to be called in, to call it in to [facility] on [address]. [place].  Initial call taken by: Melody Comas,  October 21, 2010 4:17 PM  Follow-up for Phone Call        plz call in lisinopril 10mg  one by mouth once daily for htn #30, RF :0 to above pharmacy. Follow-up by: Ronald Boyden  MD,  October 21, 2010 4:48 PM    New/Updated Medications: LISINOPRIL 10 MG TABS (LISINOPRIL) take one tabe by mouth daily Prescriptions: LISINOPRIL 10 MG TABS (LISINOPRIL) take one tabe by mouth daily  #30 x 0   Entered by:   Melody Comas   Authorized by:   Ronald Boyden  MD   Signed by:   Melody Comas on 10/21/2010   Method used:   Telephoned to ...       [facility]  [address]       [address]       [place], [place]  [address]       Ph: [phone] or [phone]       Fax: [phone]   RxID:   [id number]   Rx phoned to pharmacy to [facility] on [address] [place]. [phone]. Melody Comas  October 21, 2010 5:10 PM       Prior Medications: OMEGA 3-6-9/FISH OIL/FLAX SEED 1200 MG () 2 by mouth once daily TYLENOL EXTRA STRENGTH 500 MG TABS (ACETAMINOPHEN) 2 by mouth three times a day MOBIC 15 MG TABS (MELOXICAM) take one daily as needed pain, take with food LISINOPRIL 10 MG TABS (LISINOPRIL) take one tabe by mouth daily Current Allergies: No known allergies

## 2010-10-29 ENCOUNTER — Encounter: Payer: Self-pay | Admitting: Family Medicine

## 2010-10-29 DIAGNOSIS — M1990 Unspecified osteoarthritis, unspecified site: Secondary | ICD-10-CM

## 2010-10-29 DIAGNOSIS — G4733 Obstructive sleep apnea (adult) (pediatric): Secondary | ICD-10-CM | POA: Insufficient documentation

## 2010-10-29 DIAGNOSIS — E785 Hyperlipidemia, unspecified: Secondary | ICD-10-CM | POA: Insufficient documentation

## 2010-10-29 DIAGNOSIS — M159 Polyosteoarthritis, unspecified: Secondary | ICD-10-CM | POA: Insufficient documentation

## 2010-10-29 DIAGNOSIS — R739 Hyperglycemia, unspecified: Secondary | ICD-10-CM

## 2010-10-30 ENCOUNTER — Ambulatory Visit (INDEPENDENT_AMBULATORY_CARE_PROVIDER_SITE_OTHER): Payer: BC Managed Care – PPO | Admitting: Family Medicine

## 2010-10-30 ENCOUNTER — Encounter: Payer: Self-pay | Admitting: Family Medicine

## 2010-10-30 ENCOUNTER — Other Ambulatory Visit (INDEPENDENT_AMBULATORY_CARE_PROVIDER_SITE_OTHER): Payer: BC Managed Care – PPO

## 2010-10-30 ENCOUNTER — Other Ambulatory Visit: Payer: Self-pay | Admitting: Family Medicine

## 2010-10-30 ENCOUNTER — Encounter: Payer: Self-pay | Admitting: *Deleted

## 2010-10-30 DIAGNOSIS — I10 Essential (primary) hypertension: Secondary | ICD-10-CM

## 2010-10-30 LAB — BASIC METABOLIC PANEL
BUN: 23 mg/dL (ref 6–23)
Chloride: 105 mEq/L (ref 96–112)
GFR: 65.36 mL/min (ref >60.00)
Glucose, Bld: 113 mg/dL — ABNORMAL HIGH (ref 70–99)
Potassium: 4.8 mEq/L (ref 3.5–5.1)
Sodium: 138 mEq/L (ref 135–145)

## 2010-11-03 NOTE — Assessment & Plan Note (Signed)
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  bp stable here.  what has it been running besides elevated reading yesterday?  if other readings have been sable, likely just continue to monitor.   [name]  MD  October 30, 2010 12:01 PM    Allergies (verified): No Known Drug Allergies  Orders Added: 1)  Est. Patient Level I [54098]  Current Allergies (reviewed today): No known allergies

## 2010-11-06 ENCOUNTER — Other Ambulatory Visit: Payer: BC Managed Care – PPO

## 2010-11-17 ENCOUNTER — Other Ambulatory Visit: Payer: Self-pay | Admitting: *Deleted

## 2010-11-17 MED ORDER — LISINOPRIL 10 MG PO TABS: 10.0000 mg | ORAL_TABLET | Freq: Every day | ORAL | Status: DC

## 2010-11-23 ENCOUNTER — Encounter: Payer: Self-pay | Admitting: Family Medicine

## 2010-11-23 ENCOUNTER — Ambulatory Visit (INDEPENDENT_AMBULATORY_CARE_PROVIDER_SITE_OTHER): Payer: BC Managed Care – PPO | Admitting: Family Medicine

## 2010-11-23 DIAGNOSIS — I10 Essential (primary) hypertension: Secondary | ICD-10-CM

## 2010-11-23 NOTE — Assessment & Plan Note (Signed)
Improved control on lisinopril. Continue.  No thiazide 2/2 h/o gout.  Return 6 mo for f/u, CPE.

## 2010-11-23 NOTE — Progress Notes (Signed)
  Subjective:    Patient ID: Ronald Bullock, male    DOB: March 20, 1951, 60 y.o.   MRN: [id number]  HPI CC: HTN recheck  Brings log of bp from last month showing bp 120-140s/60-80s, but notes that home cuff does record bp a bit higher than in past.  No HA, vision changes, CP/tightness, SOB, leg swelling.   Tolerating lisinopril fine.  Requests 3 mo refill next refill.  Review of Systems Per HPI    Objective:   Physical Exam  Vitals reviewed. Constitutional: He appears well-developed and well-nourished. No distress.  HENT:  Head: Normocephalic and atraumatic.  Mouth/Throat: Oropharynx is clear and moist. No oropharyngeal exudate.  Eyes: Conjunctivae and EOM are normal. Pupils are equal, round, and reactive to light. No scleral icterus.  Neck: Normal range of motion. Neck supple. Carotid bruit is not present.  Cardiovascular: Normal rate, regular rhythm, normal heart sounds and intact distal pulses.   No murmur heard. Pulmonary/Chest: Breath sounds normal. No respiratory distress. He has no wheezes. He has no rales.  Abdominal:       No abd/renal bruits.  Skin: Skin is warm and dry. No rash noted.          Assessment & Plan:

## 2010-11-23 NOTE — Patient Instructions (Signed)
Good to see you today, blood pressure looking better than prior. Call us with questions. Return in 6 months for follow up, sooner if needed.  Return then for physical (after October 10th).

## 2010-11-25 LAB — CBC
MCHC: 35.1 g/dL (ref 30.0–36.0)
MCV: 90.6 fL (ref 78.0–100.0)
RBC: 4.41 MIL/uL (ref 4.22–5.81)

## 2010-11-25 LAB — PROTIME-INR: Prothrombin Time: 13.4 seconds (ref 11.6–15.2)

## 2011-04-02 ENCOUNTER — Ambulatory Visit (INDEPENDENT_AMBULATORY_CARE_PROVIDER_SITE_OTHER): Payer: BC Managed Care – PPO | Admitting: Family Medicine

## 2011-04-02 ENCOUNTER — Encounter: Payer: Self-pay | Admitting: Family Medicine

## 2011-04-02 DIAGNOSIS — R609 Edema, unspecified: Secondary | ICD-10-CM

## 2011-04-02 DIAGNOSIS — R7309 Other abnormal glucose: Secondary | ICD-10-CM

## 2011-04-02 DIAGNOSIS — M109 Gout, unspecified: Secondary | ICD-10-CM

## 2011-04-02 DIAGNOSIS — E785 Hyperlipidemia, unspecified: Secondary | ICD-10-CM

## 2011-04-02 DIAGNOSIS — I10 Essential (primary) hypertension: Secondary | ICD-10-CM

## 2011-04-02 MED ORDER — MELOXICAM 15 MG PO TABS: 15.0000 mg | ORAL_TABLET | Freq: Every day | ORAL | Status: DC | PRN

## 2011-04-02 MED ORDER — LISINOPRIL 10 MG PO TABS: 10.0000 mg | ORAL_TABLET | Freq: Every day | ORAL | Status: DC

## 2011-04-02 NOTE — Progress Notes (Signed)
  Subjective:    Patient ID: Ronald Bullock, male    DOB: [birth year], 60 y.o.   MRN: [id number]  HPI CC: 6 mo f/u  1. HTN - bought new cuff.  Checks at home 110-120sbp.  No HA, vision changes, CP/tightness, SOB, leg swelling.  Compliant with lisinopril.  2. Gout - takes mobic prn.  No recent attacks.  Last gout flare was >8 mo ago.  UA 9.7.  Not on gout lowering meds.  H/o L knee and hip pain, waiting on insurance change to have replacements, followed by Dr. Margreta Journey.  On CPAP for OSA.  Takes equate as needed.  H/o deviated septum.  Tries to stay active but sometimes left knee and hip pain restrict this.  Wt Readings from Last 3 Encounters:  04/02/11 247 lb 4 oz (112.152 kg)  11/23/10 251 lb 1.3 oz (113.889 kg)  10/30/10 248 lb 8 oz (112.719 kg)   Review of Systems Per HPI    Objective:   Physical Exam  Vitals reviewed. Constitutional: He appears well-developed and well-nourished. No distress.  HENT:  Head: Normocephalic and atraumatic.  Mouth/Throat: Oropharynx is clear and moist. No oropharyngeal exudate.  Eyes: Conjunctivae and EOM are normal. Pupils are equal, round, and reactive to light. No scleral icterus.  Neck: Normal range of motion. Neck supple. Carotid bruit is not present.  Cardiovascular: Normal rate, regular rhythm, normal heart sounds and intact distal pulses.   No murmur heard. Pulmonary/Chest: Breath sounds normal. No respiratory distress. He has no wheezes. He has no rales.  Abdominal: Soft. There is no tenderness.       No abd/renal bruits  Skin: Skin is warm and dry. No rash noted.          Assessment & Plan:

## 2011-04-02 NOTE — Patient Instructions (Signed)
Return in 3 months for physical. Return a few days prior fasting for blood work. Good to see you today, blood pressure is looking great! We will keep an eye on your gout.  If having another flare, may start you on gout lowering medicine.

## 2011-04-03 ENCOUNTER — Encounter: Payer: Self-pay | Admitting: Family Medicine

## 2011-04-03 NOTE — Assessment & Plan Note (Signed)
Discussed dx and importance of lowering urate level. Pt would like to avoid gout lowering meds for now. No flare recently, discussed if flare returns, may consider starting allopurinol. Lab Results  Component Value Date   CREATININE 1.2 10/30/2010

## 2011-04-03 NOTE — Assessment & Plan Note (Signed)
Much improved with increasing water, watching salt intake.

## 2011-04-03 NOTE — Assessment & Plan Note (Signed)
Good control, continue meds. Neg ROS.

## 2011-06-28 ENCOUNTER — Other Ambulatory Visit (INDEPENDENT_AMBULATORY_CARE_PROVIDER_SITE_OTHER): Payer: BC Managed Care – PPO

## 2011-06-28 DIAGNOSIS — M109 Gout, unspecified: Secondary | ICD-10-CM

## 2011-06-28 DIAGNOSIS — R7309 Other abnormal glucose: Secondary | ICD-10-CM

## 2011-06-28 DIAGNOSIS — I10 Essential (primary) hypertension: Secondary | ICD-10-CM

## 2011-06-28 DIAGNOSIS — E785 Hyperlipidemia, unspecified: Secondary | ICD-10-CM

## 2011-06-29 LAB — COMPREHENSIVE METABOLIC PANEL
Albumin: 3.8 g/dL (ref 3.5–5.2)
Alkaline Phosphatase: 55 U/L (ref 39–117)
Glucose, Bld: 106 mg/dL — ABNORMAL HIGH (ref 70–99)
Potassium: 4.6 mEq/L (ref 3.5–5.1)
Sodium: 141 mEq/L (ref 135–145)
Total Protein: 6.8 g/dL (ref 6.0–8.3)

## 2011-06-29 LAB — URIC ACID: Uric Acid, Serum: 10.3 mg/dL — ABNORMAL HIGH (ref 4.0–7.8)

## 2011-06-29 LAB — HEMOGLOBIN A1C: Hgb A1c MFr Bld: 6.1 % (ref 4.6–6.5)

## 2011-06-29 LAB — LIPID PANEL: VLDL: 27.8 mg/dL (ref 0.0–40.0)

## 2011-07-05 ENCOUNTER — Encounter: Payer: BC Managed Care – PPO | Admitting: Family Medicine

## 2011-07-07 ENCOUNTER — Encounter: Payer: Self-pay | Admitting: Family Medicine

## 2011-07-07 ENCOUNTER — Ambulatory Visit (INDEPENDENT_AMBULATORY_CARE_PROVIDER_SITE_OTHER): Payer: BC Managed Care – PPO | Admitting: Family Medicine

## 2011-07-07 DIAGNOSIS — Z0000 Encounter for general adult medical examination without abnormal findings: Secondary | ICD-10-CM | POA: Insufficient documentation

## 2011-07-07 DIAGNOSIS — M109 Gout, unspecified: Secondary | ICD-10-CM

## 2011-07-07 DIAGNOSIS — I10 Essential (primary) hypertension: Secondary | ICD-10-CM

## 2011-07-07 DIAGNOSIS — M129 Arthropathy, unspecified: Secondary | ICD-10-CM

## 2011-07-07 DIAGNOSIS — M1990 Unspecified osteoarthritis, unspecified site: Secondary | ICD-10-CM

## 2011-07-07 DIAGNOSIS — R7309 Other abnormal glucose: Secondary | ICD-10-CM

## 2011-07-07 NOTE — Patient Instructions (Addendum)
Good to see you today. Keep thinking about flu shot. Keep eye on sugar.  More water.  Limit red meat to 2x/wk. Return as needed or in 1 year for next physical.

## 2011-07-07 NOTE — Assessment & Plan Note (Signed)
Prediabetes range. Discussed limiting carbs.

## 2011-07-07 NOTE — Assessment & Plan Note (Signed)
Chronic, stable 

## 2011-07-07 NOTE — Progress Notes (Signed)
Subjective:    Patient ID: Ronald Bullock, male    DOB: 1951/07/31, 60 y.o.   MRN: [id number]  HPI CC: CPE  Having L knee/R hip issues.  Hopeful to get left knee replaced this year then right hip.  Followed by ortho  Preventative: UTD tetanus 2010 declines flu colonoscopy 06/2010 wnl, rpt due 10 yr.  H/o internal hemorrhoids.  Noticing some blood after straining.   prostate last checked 2011 WNL and PSA <1, maybe slightly weakening of stream, no nocturia.  No fmhx.  Would like to decline today.  Would like to check next year.  Wt Readings from Last 3 Encounters:  07/07/11 252 lb (114.306 kg)  04/02/11 247 lb 4 oz (112.152 kg)  11/23/10 251 lb 1.3 oz (113.889 kg)   Medications and allergies reviewed and updated in chart.  Past histories reviewed and updated if relevant as below. Patient Active Problem List  Diagnoses  . Gout, unspecified  . ARTHROPATHY, TRANSIENT, MULTIPLE SITES  . ANKLE PAIN, LEFT  . DIZZINESS, CHRONIC  . PERIPHERAL EDEMA  . HYPERGLYCEMIA  . OVERWEIGHT  . HYPERTENSION, ESSENTIAL  . HLD (hyperlipidemia)  . Arthritis  . OSA (obstructive sleep apnea)   Past Medical History  Diagnosis Date  . HLD (hyperlipidemia)     Low HDL  . Arthritis     Left knee cartilage gone; cracked hip 2006  . OSA (obstructive sleep apnea)   . Gout   . Hyperglycemia   . Hypertension    Past Surgical History  Procedure Date  . Knee arthroscopy 1968    cartilage removal  . Ct head limited w/cm 1997    WNL  . Cardiolyte 1997    WNL  . Carotid US 1997    WNL  . Esophagogastroduodenoscopy 1998    gastric polyps, benign  . Colonoscopy 1998    WNL  . Mri lumbar 2000    mild bulge L3/4, mild foraminal narrowing L4/5, L5/S2, small disk herniation  . Colonoscopy 06/2010    WNL, internal hemorrhoids, rec rpt 10 yrs   History  Substance Use Topics  . Smoking status: Never Smoker   . Smokeless tobacco: Never Used  . Alcohol Use: Yes     Rare   Family History    Problem Relation Age of Onset  . Stroke Father 23  . Diabetes Father   . Asthma Mother   . Cancer Mother     Vaginal; radiation dz of bowel  . Obesity Sister    No Known Allergies Current Outpatient Prescriptions on File Prior to Visit  Medication Sig Dispense Refill  . lisinopril (PRINIVIL,ZESTRIL) 10 MG tablet Take 1 tablet (10 mg total) by mouth daily.  90 tablet  3  . meloxicam (MOBIC) 15 MG tablet Take 1 tablet (15 mg total) by mouth daily as needed.  30 tablet  3  . Omega-3-6-9 CAPS Take 2 capsules by mouth daily.         Review of Systems  Constitutional: Negative for fever, chills, activity change, appetite change, fatigue and unexpected weight change.  HENT: Negative for hearing loss and neck pain.   Eyes: Negative for visual disturbance.  Respiratory: Negative for cough, chest tightness, shortness of breath and wheezing.   Cardiovascular: Negative for chest pain, palpitations and leg swelling.  Gastrointestinal: Positive for blood in stool. Negative for nausea, vomiting, abdominal pain, diarrhea, constipation and abdominal distention.  Genitourinary: Negative for hematuria and difficulty urinating.  Musculoskeletal: Negative for myalgias and arthralgias.  Skin:  Negative for rash.  Neurological: Negative for dizziness, seizures, syncope and headaches.  Hematological: Does not bruise/bleed easily.  Psychiatric/Behavioral: Negative for dysphoric mood. The patient is not nervous/anxious.        Objective:   Physical Exam  Nursing note and vitals reviewed. Constitutional: He is oriented to person, place, and time. He appears well-developed and well-nourished. No distress.  HENT:  Head: Normocephalic and atraumatic.  Right Ear: External ear normal.  Left Ear: External ear normal.  Nose: Nose normal.  Mouth/Throat: Oropharynx is clear and moist. No oropharyngeal exudate.  Eyes: Conjunctivae and EOM are normal. Pupils are equal, round, and reactive to light. No scleral  icterus.  Neck: Normal range of motion. Neck supple.  Cardiovascular: Normal rate, regular rhythm, normal heart sounds and intact distal pulses.   No murmur heard. Pulses:      Radial pulses are 2+ on the right side, and 2+ on the left side.  Pulmonary/Chest: Effort normal and breath sounds normal. No respiratory distress. He has no wheezes. He has no rales.  Abdominal: Soft. Bowel sounds are normal. He exhibits no distension and no mass. There is no tenderness. There is no rebound and no guarding.  Genitourinary: Rectum normal and prostate normal. Rectal exam shows no external hemorrhoid, no fissure, no mass, no tenderness and anal tone normal. Guaiac negative stool. Prostate is not enlarged and not tender.  Musculoskeletal: Normal range of motion. He exhibits edema (trace pitting edema).  Lymphadenopathy:    He has no cervical adenopathy.  Neurological: He is alert and oriented to person, place, and time.       CN grossly intact, station and gait intact  Skin: Skin is warm and dry. No rash noted.  Psychiatric: He has a normal mood and affect. His behavior is normal. Judgment and thought content normal.      Assessment & Plan:

## 2011-07-07 NOTE — Assessment & Plan Note (Signed)
Not having recurrent attacks. Doesn't want to start urate lowering med.

## 2011-07-07 NOTE — Assessment & Plan Note (Signed)
Hopeful for surgery this year.

## 2011-07-07 NOTE — Assessment & Plan Note (Signed)
Reviewed preventative protocols and updated unless pt declined. Declines flu shot. Discussed healthy lifestyle,diet,exercise.

## 2011-07-19 ENCOUNTER — Other Ambulatory Visit: Payer: Self-pay | Admitting: *Deleted

## 2011-07-19 NOTE — Telephone Encounter (Signed)
Spoke with patient. He said he did not go to UC/ER. He said he iced and elevated his feet and that seemed to help some. He says they are still painful, but he's dealing with it. I offered him an appt and he said [place] was [place], so he may call when he gets back in town if he needs to. I advised to go to UC where he is at if he feels he needs to be seen sooner so he is not suffering. He verbalized understanding.

## 2011-07-19 NOTE — Telephone Encounter (Signed)
Noted.  Could send voltaren gel to pharmacy of his convenience.  Have placed order in chart.  But agree with [facility] if not improving.  (he may have to wait for prior auth though)

## 2011-07-19 NOTE — Telephone Encounter (Signed)
Noted. Can we call for update?

## 2011-07-19 NOTE — Telephone Encounter (Signed)
Reason for Call: Caller: Wali/Patient; PCP: [name]; CB#: [phone]; Call Reason: Pain; Sx Onset: 07/17/2011; Sx Notes: ; ; Guideline Used:heel pain ; Disp:; Appt Scheduled?: N Pt is calling with severe heel pain. Onset this am. Pt is afeb. Pt states he had similar issue 2 years ago and was prescribed Volatren cream. Pt takes Meloxicam and can't take NSAIDs. RN advised UC/office closed. Protocol(s) Used: Foot Non-Injury Recommended Outcome per Protocol: See ED Immediately Reason for Outcome: Unbearable pain Care Advice: ~ Protect the patient from falling or other harm. ~ IMMEDIATE ACTION ~ DO NOT attempt to place any weight on the affected extremity until evaluated by provider. 12/

## 2011-07-20 NOTE — Telephone Encounter (Signed)
Spoke with patient and he said he didn't want the gel. He said he didn't feel like it would help the type of pain he has. He scheduled an appt for Friday when he would be back in town.

## 2011-07-23 ENCOUNTER — Encounter: Payer: Self-pay | Admitting: Family Medicine

## 2011-07-23 ENCOUNTER — Ambulatory Visit (INDEPENDENT_AMBULATORY_CARE_PROVIDER_SITE_OTHER): Payer: BC Managed Care – PPO | Admitting: Family Medicine

## 2011-07-23 VITALS — BP 118/70 | HR 80 | Temp 98.5°F | Wt 251.8 lb

## 2011-07-23 DIAGNOSIS — M79671 Pain in right foot: Secondary | ICD-10-CM | POA: Insufficient documentation

## 2011-07-23 DIAGNOSIS — M79609 Pain in unspecified limb: Secondary | ICD-10-CM

## 2011-07-23 MED ORDER — ALLOPURINOL 300 MG PO TABS: 150.0000 mg | ORAL_TABLET | Freq: Every day | ORAL | Status: DC

## 2011-07-23 MED ORDER — PREDNISONE 20 MG PO TABS: 40.0000 mg | ORAL_TABLET | Freq: Every day | ORAL | Status: AC

## 2011-07-23 NOTE — Assessment & Plan Note (Signed)
In h/o gout.  Will treat as such with starting allopurinol as last uric acid 10.3. Start prednisone course while starting allopurinol, rtc 3-4 wks for blood work. Update [place] if worsening instead of improving on this regimen.

## 2011-07-23 NOTE — Progress Notes (Signed)
Subjective:    Patient ID: Ronald Bullock, male    DOB: [birth year], 60 y.o.   MRN: [id number]  HPI CC: bilateral foot ache  60yo with h/o gout and uric acid 10.3 not on gout controlling meds presents with foot problems.  This feels different from typical gout flares.  Saturday had bad achilles pain on left side, then that resolved but started having right foot pain, worse at ball of foot.  Trouble walking, walking with limp and on outside of foot.  Ankle staying swollen, tender and swollen entire foot.  Now feeling wrist pain/weakness.    No fevers/chills, spreading redness.  Medications and allergies reviewed and updated in chart.  Past histories reviewed and updated if relevant as below. Patient Active Problem List  Diagnoses  . Gout, unspecified  . ARTHROPATHY, TRANSIENT, MULTIPLE SITES  . ANKLE PAIN, LEFT  . DIZZINESS, CHRONIC  . PERIPHERAL EDEMA  . HYPERGLYCEMIA  . OVERWEIGHT  . HYPERTENSION, ESSENTIAL  . HLD (hyperlipidemia)  . Arthritis  . OSA (obstructive sleep apnea)  . Healthcare maintenance  . Right foot pain   Past Medical History  Diagnosis Date  . HLD (hyperlipidemia)     Low HDL  . Arthritis     Left knee cartilage gone; cracked hip 2006  . OSA (obstructive sleep apnea)   . Gout   . Hyperglycemia   . Hypertension    Past Surgical History  Procedure Date  . Knee arthroscopy 1968    cartilage removal  . Ct head limited w/cm 1997    WNL  . Cardiolyte 1997    WNL  . Carotid US 1997    WNL  . Esophagogastroduodenoscopy 1998    gastric polyps, benign  . Colonoscopy 1998    WNL  . Mri lumbar 2000    mild bulge L3/4, mild foraminal narrowing L4/5, L5/S2, small disk herniation  . Colonoscopy 06/2010    WNL, internal hemorrhoids, rec rpt 10 yrs   History  Substance Use Topics  . Smoking status: Never Smoker   . Smokeless tobacco: Never Used  . Alcohol Use: Yes     Rare   Family History  Problem Relation Age of Onset  . Stroke Father 70    . Diabetes Father   . Asthma Mother   . Cancer Mother     Vaginal; radiation dz of bowel  . Obesity Sister    No Known Allergies Current Outpatient Prescriptions on File Prior to Visit  Medication Sig Dispense Refill  . lisinopril (PRINIVIL,ZESTRIL) 10 MG tablet Take 1 tablet (10 mg total) by mouth daily.  90 tablet  3  . meloxicam (MOBIC) 15 MG tablet Take 1 tablet (15 mg total) by mouth daily as needed.  30 tablet  3  . Omega-3-6-9 CAPS Take 2 capsules by mouth daily.         Lives with wife, has grown son Activity: no regular exercise Diet: good water, some fish, good vegetables, red meat 3x/wk  Review of Systems Per HPI    Objective:   Physical Exam  Nursing note and vitals reviewed. Constitutional: He appears well-developed and well-nourished. No distress.  Cardiovascular:  Pulses:      Dorsalis pedis pulses are 2+ on the right side, and 2+ on the left side.       Posterior tibial pulses are 2+ on the right side, and 2+ on the left side.  Musculoskeletal:       No ankle pain, FROM at ankles. No heel  or achilles tendon pain. Right 1st MTP slight erythema/edema, tender to palpation plantar surface.  No pain with axial loading of big toe.  Skin: Skin is warm and dry. No rash noted.  Psychiatric: He has a normal mood and affect.      Assessment & Plan:

## 2011-07-23 NOTE — Patient Instructions (Signed)
Take short course of prednisone while we get you started on allopurinol.  Don't take prednisone with meloxicam. Return for lab visit in 3-4 weeks to check kidneys and uric acid.  Gout Gout is an inflammatory condition (arthritis) caused by a buildup of uric acid crystals in the joints. Uric acid is a chemical that is normally present in the blood. Under some circumstances, uric acid can form into crystals in your joints. This causes joint redness, soreness, and swelling (inflammation). Repeat attacks are common. Over time, uric acid crystals can form into masses (tophi) near a joint, causing disfigurement. Gout is treatable and often preventable. CAUSES  The disease begins with elevated levels of uric acid in the blood. Uric acid is produced by your body when it breaks down a naturally found substance called purines. This also happens when you eat certain foods such as meats and fish. Causes of an elevated uric acid level include:  Being passed down from parent to child (heredity).   Diseases that cause increased uric acid production (obesity, psoriasis, some cancers).   Excessive alcohol use.   Diet, especially diets rich in meat and seafood.   Medicines, including certain cancer-fighting drugs (chemotherapy), diuretics, and aspirin.   Chronic kidney disease. The kidneys are no longer able to remove uric acid well.   Problems with metabolism.  Conditions strongly associated with gout include:  Obesity.   High blood pressure.   High cholesterol.   Diabetes.  Not everyone with elevated uric acid levels gets gout. It is not understood why some people get gout and others do not. Surgery, joint injury, and eating too much of certain foods are some of the factors that can lead to gout. SYMPTOMS   An attack of gout comes on quickly. It causes intense pain with redness, swelling, and warmth in a joint.   Fever can occur.   Often, only one joint is involved. Certain joints are more  commonly involved:   Base of the big toe.   Knee.   Ankle.   Wrist.   Finger.  Without treatment, an attack usually goes away in a few days to weeks. Between attacks, you usually will not have symptoms, which is different from many other forms of arthritis. DIAGNOSIS  Your caregiver will suspect gout based on your symptoms and exam. Removal of fluid from the joint (arthrocentesis) is done to check for uric acid crystals. Your caregiver will give you a medicine that numbs the area (local anesthetic) and use a needle to remove joint fluid for exam. Gout is confirmed when uric acid crystals are seen in joint fluid, using a special microscope. Sometimes, blood, urine, and X-ray tests are also used. TREATMENT  There are 2 phases to gout treatment: treating the sudden onset (acute) attack and preventing attacks (prophylaxis). Treatment of an Acute Attack  Medicines are used. These include anti-inflammatory medicines or steroid medicines.   An injection of steroid medicine into the affected joint is sometimes necessary.   The painful joint is rested. Movement can worsen the arthritis.   You may use warm or cold treatments on painful joints, depending which works best for you.   Discuss the use of coffee, vitamin C, or cherries with your caregiver. These may be helpful treatment options.  Treatment to Prevent Attacks After the acute attack subsides, your caregiver may advise prophylactic medicine. These medicines either help your kidneys eliminate uric acid from your body or decrease your uric acid production. You may need to stay on these  medicines for a very long time. The early phase of treatment with prophylactic medicine can be associated with an increase in acute gout attacks. For this reason, during the first few months of treatment, your caregiver may also advise you to take medicines usually used for acute gout treatment. Be sure you understand your caregiver's directions. You should  also discuss dietary treatment with your caregiver. Certain foods such as meats and fish can increase uric acid levels. Other foods such as dairy can decrease levels. Your caregiver can give you a list of foods to avoid. HOME CARE INSTRUCTIONS   Do not take aspirin to relieve pain. This raises uric acid levels.   Only take over-the-counter or prescription medicines for pain, discomfort, or fever as directed by your caregiver.   Rest the joint as much as possible. When in bed, keep sheets and blankets off painful areas.   Keep the affected joint raised (elevated).   Use crutches if the painful joint is in your leg.   Drink enough water and fluids to keep your urine clear or pale yellow. This helps your body get rid of uric acid. Do not drink alcoholic beverages. They slow the passage of uric acid.   Follow your caregiver's dietary instructions. Pay careful attention to the amount of protein you eat. Your daily diet should emphasize fruits, vegetables, whole grains, and fat-free or low-fat milk products.   Maintain a healthy body weight.  SEEK MEDICAL CARE IF:   You have an oral temperature above 102 F (38.9 C).   You develop diarrhea, vomiting, or any side effects from medicines.   You do not feel better in 24 hours, or you are getting worse.  SEEK IMMEDIATE MEDICAL CARE IF:   Your joint becomes suddenly more tender and you have:   Chills.   An oral temperature above 102 F (38.9 C), not controlled by medicine.  MAKE SURE YOU:   Understand these instructions.   Will watch your condition.   Will get help right away if you are not doing well or get worse.  Document Released: 07/30/2000 Document Revised: 04/14/2011 Document Reviewed: 11/10/2009 [facility] Patient Information 2012 [place], [place].

## 2011-08-04 ENCOUNTER — Telehealth: Payer: Self-pay | Admitting: Family Medicine

## 2011-08-04 NOTE — Telephone Encounter (Signed)
Dr. Sharen Hones prescribed Prednisone with gout medicine for his gout and he said it did clear it up in his feet, knees and hips after 1st day on it however after he had completed the dose and no longer taking prednisone, the symptoms returned and still having foot problems and pain in ball of foot.  Please call back at [phone]

## 2011-08-04 NOTE — Telephone Encounter (Signed)
Left message for patient notifying him that I would call him back and let him know if you had any other suggestions for him.

## 2011-08-05 MED ORDER — COLCHICINE 0.6 MG PO TABS: 0.6000 mg | ORAL_TABLET | Freq: Every day | ORAL | Status: DC

## 2011-08-05 NOTE — Telephone Encounter (Signed)
Patient notified and will update if no improvement.

## 2011-08-05 NOTE — Telephone Encounter (Addendum)
Is he taking meloxicam daily? I will send in colchicine for him to use as needed, may do 2 on day one followed by 1 pill daily for 1 wk to see if foot pain improves.

## 2011-08-13 ENCOUNTER — Other Ambulatory Visit (INDEPENDENT_AMBULATORY_CARE_PROVIDER_SITE_OTHER): Payer: BC Managed Care – PPO

## 2011-08-13 DIAGNOSIS — M79609 Pain in unspecified limb: Secondary | ICD-10-CM

## 2011-08-13 DIAGNOSIS — M79671 Pain in right foot: Secondary | ICD-10-CM

## 2011-08-13 LAB — BASIC METABOLIC PANEL
CO2: 27 mEq/L (ref 19–32)
Calcium: 8.8 mg/dL (ref 8.4–10.5)
Chloride: 107 mEq/L (ref 96–112)
Glucose, Bld: 119 mg/dL — ABNORMAL HIGH (ref 70–99)
Potassium: 4.6 mEq/L (ref 3.5–5.1)
Sodium: 141 mEq/L (ref 135–145)

## 2011-08-30 ENCOUNTER — Other Ambulatory Visit: Payer: Self-pay | Admitting: *Deleted

## 2011-08-30 MED ORDER — MELOXICAM 15 MG PO TABS: 15.0000 mg | ORAL_TABLET | Freq: Every day | ORAL | Status: DC | PRN

## 2011-11-15 ENCOUNTER — Other Ambulatory Visit: Payer: Self-pay | Admitting: Family Medicine

## 2011-11-15 MED ORDER — LISINOPRIL 10 MG PO TABS: 10.0000 mg | ORAL_TABLET | Freq: Every day | ORAL | Status: DC

## 2012-01-04 ENCOUNTER — Other Ambulatory Visit: Payer: Self-pay | Admitting: Family Medicine

## 2012-02-28 ENCOUNTER — Ambulatory Visit (INDEPENDENT_AMBULATORY_CARE_PROVIDER_SITE_OTHER): Payer: BC Managed Care – PPO | Admitting: Family Medicine

## 2012-02-28 ENCOUNTER — Encounter: Payer: Self-pay | Admitting: Family Medicine

## 2012-02-28 VITALS — BP 122/68 | HR 72 | Temp 97.7°F | Wt 241.5 lb

## 2012-02-28 DIAGNOSIS — R42 Dizziness and giddiness: Secondary | ICD-10-CM | POA: Insufficient documentation

## 2012-02-28 LAB — CBC WITH DIFFERENTIAL/PLATELET
Basophils Relative: 0.5 % (ref 0.0–3.0)
Eosinophils Relative: 3.3 % (ref 0.0–5.0)
HCT: 41.1 % (ref 39.0–52.0)
Hemoglobin: 13.8 g/dL (ref 13.0–17.0)
Lymphs Abs: 1.6 10*3/uL (ref 0.7–4.0)
MCV: 91.6 fl (ref 78.0–100.0)
Monocytes Absolute: 0.7 10*3/uL (ref 0.1–1.0)
Monocytes Relative: 12.7 % — ABNORMAL HIGH (ref 3.0–12.0)
Neutro Abs: 2.7 10*3/uL (ref 1.4–7.7)
Platelets: 162 10*3/uL (ref 150.0–400.0)
RBC: 4.49 Mil/uL (ref 4.22–5.81)
WBC: 5.2 10*3/uL (ref 4.5–10.5)

## 2012-02-28 LAB — BASIC METABOLIC PANEL
BUN: 22 mg/dL (ref 6–23)
CO2: 27 mEq/L (ref 19–32)
Calcium: 9.2 mg/dL (ref 8.4–10.5)
GFR: 64.45 mL/min (ref >60.00)
Glucose, Bld: 110 mg/dL — ABNORMAL HIGH (ref 70–99)
Sodium: 139 mEq/L (ref 135–145)

## 2012-02-28 LAB — TSH: TSH: 1.8 u[IU]/mL (ref 0.35–5.50)

## 2012-02-28 MED ORDER — MECLIZINE HCL 25 MG PO TABS: 25.0000 mg | ORAL_TABLET | Freq: Three times a day (TID) | ORAL | Status: AC | PRN

## 2012-02-28 NOTE — Assessment & Plan Note (Signed)
Recent worsening of dizziness in h/o chronic dizziness in past. Does not describe typical vertigo episodes but has h/o same. Neg dix hallpike, nonfocal neuro exam, no carotid bruit on exam today. ? BPPV - treat as such => sent home with modified epley handout as well as meclizine trial. Check blood work today to eval other causes of dizziness - CBC, TSH, BMP. If not improving, consider referral to vestibular training vs consider vertebrobasilar ischemia.
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
## 2012-02-28 NOTE — Patient Instructions (Signed)
Try exercises provided today.  May use meclizine as needed.  if not better let me know. Blood work today to check on other causes of dizziness

## 2012-03-10 ENCOUNTER — Telehealth: Payer: Self-pay

## 2012-03-10 NOTE — Telephone Encounter (Signed)
Pt still having dizziness on and off; Meclizine causes pt to be sleepy; pt request med for dizziness that will not make him sleepy for daytime use. (Dizziness is on and off.)[facility]

## 2012-03-10 NOTE — Telephone Encounter (Addendum)
Discussed with pt.  Recommend do BPPV exercises, call me if not improved and we will refer to vestibular rehab.  Also recommended cutting meclizine in 1/2.

## 2012-05-05 ENCOUNTER — Other Ambulatory Visit: Payer: Self-pay | Admitting: Family Medicine

## 2012-07-11 ENCOUNTER — Ambulatory Visit: Payer: BC Managed Care – PPO | Admitting: Family Medicine

## 2012-07-11 ENCOUNTER — Ambulatory Visit (INDEPENDENT_AMBULATORY_CARE_PROVIDER_SITE_OTHER): Payer: BC Managed Care – PPO | Admitting: Family Medicine

## 2012-07-11 ENCOUNTER — Encounter: Payer: Self-pay | Admitting: Family Medicine

## 2012-07-11 VITALS — BP 116/70 | HR 72 | Temp 98.1°F | Ht 71.0 in | Wt 240.8 lb

## 2012-07-11 DIAGNOSIS — G5620 Lesion of ulnar nerve, unspecified upper limb: Secondary | ICD-10-CM

## 2012-07-11 NOTE — Progress Notes (Signed)
[facility] conservation officer at [facility] [address] [place] [address] Phone: [phone] Fax: [phone]  Date:  07/11/2012   Name:  Ronald Bullock   DOB:  1950/10/25   MRN:  [id number] Gender: male Age: 61 y.o.  PCP:  Ronald Boyden, MD  Evaluating MD: Ronald Beat, MD   Chief Complaint: right hand going to sleep   History of Present Illness:  Ronald Bullock is a 61 y.o. pleasant patient who presents with the following:  Problems over the last couple of months, when lying down hand will go to sleep, when sleeping on the R hand, getting up yesterday - a lot worse yest.  Over about the last 6 months.   Intermittently over decades, the patient has had problems with hand going to sleep after sleeping on arm or with arms bent. He actually had a problem as a child with numbness with throwing a baseball, so he had to quit this sport. 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 It generally will feel better when sleeps in extension.   Distal parasthesias, but not including the thumb.  Patient Active Problem List  Diagnosis  . Gout, unspecified  . ARTHROPATHY, TRANSIENT, MULTIPLE SITES  . ANKLE PAIN, LEFT  . DIZZINESS, CHRONIC  . PERIPHERAL EDEMA  . HYPERGLYCEMIA  . OVERWEIGHT  . HYPERTENSION, ESSENTIAL  . HLD (hyperlipidemia)  . Arthritis  . OSA (obstructive sleep apnea)  . Healthcare maintenance  . Right foot pain  . Dizziness    Past Medical History  Diagnosis Date  . HLD (hyperlipidemia)     Low HDL  . Arthritis     Left knee cartilage gone; cracked hip 2006  . OSA on CPAP   . Gout     rare flares  . Hyperglycemia   . Hypertension     Past Surgical History  Procedure Date  . Knee arthroscopy 1968    cartilage removal  . Ct head limited w/cm 1997    WNL  . Cardiolyte 1997    WNL  . Carotid US 1997    WNL  . Esophagogastroduodenoscopy 1998    gastric polyps, benign  . Colonoscopy 1998    WNL  .  Mri lumbar 2000    mild bulge L3/4, mild foraminal narrowing L4/5, L5/S2, small disk herniation  . Colonoscopy 06/2010    WNL, internal hemorrhoids, rec rpt 10 yrs    History  Substance Use Topics  . Smoking status: Never Smoker   . Smokeless tobacco: Never Used  . Alcohol Use: Yes     Comment: Rare    Family History  Problem Relation Age of Onset  . Stroke Father 60  . Diabetes Father   . Asthma Mother   . Cancer Mother     Vaginal; radiation dz of bowel  . Obesity Sister     No Known Allergies  Medication list has been reviewed and updated.  Outpatient Prescriptions Prior to Visit  Medication Sig Dispense Refill  . acetaminophen (TYLENOL) 500 MG tablet Take 1,000 mg by mouth 2 (two) times daily as needed.        Marland Kitchen lisinopril (PRINIVIL,ZESTRIL) 10 MG tablet Take 1 tablet (10 mg total) by mouth daily.  90 tablet  3  . meloxicam (MOBIC) 15 MG tablet TAKE 1 TABLET (15 MG TOTAL) BY MOUTH DAILY AS NEEDED.  30 tablet  3  . Omega-3-6-9 CAPS Take 2 capsules by mouth daily.        Marland Kitchen  allopurinol (ZYLOPRIM) 300 MG tablet Take 0.5 tablets (150 mg total) by mouth daily.  30 tablet  3  . colchicine 0.6 MG tablet Take 1 tablet (0.6 mg total) by mouth daily.  30 tablet  0   Last reviewed on 02/28/2012  9:39 AM by Ronald Boyden, MD  Review of Systems:   GEN: No fevers, chills. Nontoxic. Primarily MSK c/o today. MSK: Detailed in the HPI GI: tolerating PO intake without difficulty Neuro: detailed above Otherwise the pertinent positives of the ROS are noted above.    Physical Examination: Filed Vitals:   07/11/12 1024  BP: 116/70  Pulse: 72  Temp: 98.1 F (36.7 C)  TempSrc: Oral  Height: 5\' 11"  (1.803 m)  Weight: 240 lb 12 oz (109.203 kg)  SpO2: 98%    Body mass index is 33.58 kg/(m^2). Ideal Body Weight: Weight in (lb) to have BMI = 25: 178.9    GEN: WDWN, NAD, Non-toxic, Alert & Oriented x 3 HEENT: Atraumatic, Normocephalic.  Ears and Nose: No external  deformity. EXTR: No clubbing/cyanosis/edema NEURO: Normal gait.  PSYCH: Normally interactive. Conversant. Not depressed or anxious appearing.  Calm demeanor.   R elbow Ecchymosis or edema: neg ROM: full flexion, extension, pronation, supination Shoulder ROM: Full Flexion: 5/5 Extension: 5/5 Supination: 5/5  Pronation: 5/5 Wrist ext: 5/5 Wrist flexion: 5/5 No gross bony abnormality Varus and Valgus stress: stable ECRB tenderness: neg Medial epicondyle: NT Lateral epicondyle, resisted wrist extension from wrist full pronation and flexion: NT grip: 5/5  sensation intact Tinel's, Elbow: minimally pos  Wrist, tinnel's mildly pos phalen's negative   Assessment and Plan:  1. Ulnar neuropathy at elbow    Historically, classic ulnar neuropathy at elbow.  May have some degree of entrapment at wrist.  Medial nerve seems minimally involved.  Elbow pad for driving. For sleep, sleep in extension can try towel with duct tape  Basic rehab reviewed.   If worsens, formal NCV and EMG could be done, but he is no where near considering surgery at this point.  Orders Today:  No orders of the defined types were placed in this encounter.    Updated Medication List: (Includes new medications, updates to list, dose adjustments) No orders of the defined types were placed in this encounter.    Medications Discontinued: There are no discontinued medications.   Ronald Beat, MD

## 2012-08-24 ENCOUNTER — Encounter: Payer: Self-pay | Admitting: Family Medicine

## 2012-09-12 ENCOUNTER — Other Ambulatory Visit: Payer: Self-pay | Admitting: Orthopaedic Surgery

## 2012-09-13 ENCOUNTER — Encounter (HOSPITAL_COMMUNITY): Payer: Self-pay | Admitting: Pharmacy Technician

## 2012-09-15 NOTE — Pre-Procedure Instructions (Signed)
Ronald Bullock  09/15/2012   Your procedure is scheduled on:  Tuesday Sep 26, 2012  Report to [facility] at 9:00 AM.  Call this number if you have problems the morning of surgery: [phone]   Remember:   Do not eat food or drink liquids after midnight.   Take these medicines the morning of surgery with A SIP OF WATER: allopurinol, colchicine, tylenol (if needed)   Do not wear jewelry, make-up or nail polish.  Do not wear lotions, powders, or perfumes.  Do not shave 48 hours prior to surgery. Men may shave face and neck.  Do not bring valuables to the hospital.  Contacts, dentures or bridgework may not be worn into surgery.  Leave suitcase in the car. After surgery it may be brought to your room.     Patients discharged the day of surgery will not be allowed to drive  home.  Name and phone number of your driver: family / friend  Special Instructions: Shower using CHG 2 nights before surgery and the night before surgery.  If you shower the day of surgery use CHG.  Use special wash - you have one bottle of CHG for all showers.  You should use approximately 1/3 of the bottle for each shower.   Please read over the following fact sheets that you were given: Pain Booklet, Coughing and Deep Breathing, Blood Transfusion Information, Total Joint Packet, MRSA Information and Surgical Site Infection Prevention

## 2012-09-18 ENCOUNTER — Encounter (HOSPITAL_COMMUNITY)
Admission: RE | Admit: 2012-09-18 | Discharge: 2012-09-18 | Disposition: A | Discharge status: Home / Self Care | Payer: BC Managed Care – PPO | Source: Ambulatory Visit | Attending: Orthopaedic Surgery | Admitting: Orthopaedic Surgery

## 2012-09-18 ENCOUNTER — Encounter (HOSPITAL_COMMUNITY): Payer: Self-pay

## 2012-09-18 LAB — TYPE AND SCREEN

## 2012-09-18 LAB — CBC WITH DIFFERENTIAL/PLATELET
Basophils Absolute: 0 10*3/uL (ref 0.0–0.1)
Eosinophils Absolute: 0.3 10*3/uL (ref 0.0–0.7)
Lymphocytes Relative: 29 % (ref 12–46)
Lymphs Abs: 1.6 10*3/uL (ref 0.7–4.0)
MCH: 30.2 pg (ref 26.0–34.0)
Neutrophils Relative %: 53 % (ref 43–77)
Platelets: 168 10*3/uL (ref 150–400)
RBC: 4.34 MIL/uL (ref 4.22–5.81)
WBC: 5.5 10*3/uL (ref 4.0–10.5)

## 2012-09-18 LAB — ABO/RH: ABO/RH(D): B POS

## 2012-09-18 LAB — PROTIME-INR
INR: 1 (ref 0.00–1.49)
Prothrombin Time: 13.1 seconds (ref 11.6–15.2)

## 2012-09-18 LAB — URINALYSIS, ROUTINE W REFLEX MICROSCOPIC
Bilirubin Urine: NEGATIVE
Hgb urine dipstick: NEGATIVE
Ketones, ur: NEGATIVE mg/dL
Nitrite: NEGATIVE
pH: 6.5 (ref 5.0–8.0)

## 2012-09-18 LAB — BASIC METABOLIC PANEL
Calcium: 9.3 mg/dL (ref 8.4–10.5)
GFR calc non Af Amer: 67 mL/min — ABNORMAL LOW (ref >90)
Sodium: 139 mEq/L (ref 135–145)

## 2012-09-18 LAB — APTT: aPTT: 28 seconds (ref 24–37)

## 2012-09-18 NOTE — Progress Notes (Signed)
Pt here for PAT.  Sleep Apnea/Study: positive; Sleep Study 04/2010( in EPIC);  Unsure of CPAP settings, believe 9-12; Instructed to bring   mask DOS. Heart Cath: Denies Stress: > 5 years; no cardiac issues. ECHO- Denies.

## 2012-09-20 NOTE — H&P (Signed)
TOTAL KNEE ADMISSION H&P  Patient is being admitted for left total knee arthroplasty.  Subjective:  Chief Complaint:left knee pain.  HPI: Ronald Bullock, 62 y.o. male, has a history of pain and functional disability in the left knee due to arthritis and has failed non-surgical conservative treatments for greater than 12 weeks to includeNSAID's and/or analgesics, corticosteriod injections, flexibility and strengthening excercises, weight reduction as appropriate and activity modification.  Onset of symptoms was gradual, starting >10 years ago with gradually worsening course since that time. The patient noted prior procedures on the knee to include  menisectomy on the left knee(s).  Patient currently rates pain in the left knee(s) at 8 out of 10 with activity. Patient has night pain, worsening of pain with activity and weight bearing, pain that interferes with activities of daily living, pain with passive range of motion, crepitus and joint swelling.  Patient has evidence of subchondral sclerosis, periarticular osteophytes and joint space narrowing by imaging studies. This patient has had previous meniscectomy. There is no active infection.  Patient Active Problem List   Diagnosis Date Noted  . Ulnar neuropathy at wrist 07/11/2012  . Ulnar neuropathy at elbow 07/11/2012  . Dizziness 02/28/2012  . Right foot pain 07/23/2011  . Healthcare maintenance 07/07/2011  . HLD (hyperlipidemia)   . Arthritis   . OSA (obstructive sleep apnea)   . OVERWEIGHT 09/30/2010  . PERIPHERAL EDEMA 05/12/2010  . Gout, unspecified 09/24/2009  . ANKLE PAIN, LEFT 09/03/2008  . DIZZINESS, CHRONIC 05/04/2007  . HYPERTENSION, ESSENTIAL 04/25/2007  . HYPERGLYCEMIA 04/16/2004  . ARTHROPATHY, TRANSIENT, MULTIPLE SITES 11/14/2001   Past Medical History  Diagnosis Date  . HLD (hyperlipidemia)     Low HDL  . Arthritis     L end stage knee DKD, mild R knee DJD, R hip end stage DJD  . OSA on CPAP   . Gout     rare  flares  . Hyperglycemia   . Hypertension     Past Surgical History  Procedure Date  . Knee arthroscopy 1968    cartilage removal  . Ct head limited w/cm 1997    WNL  . Cardiolyte 1997    WNL  . Carotid US 1997    WNL  . Esophagogastroduodenoscopy 1998    gastric polyps, benign  . Colonoscopy 1998    WNL  . Mri lumbar 2000    mild bulge L3/4, mild foraminal narrowing L4/5, L5/S2, small disk herniation  . Colonoscopy 06/2010    WNL, internal hemorrhoids, rec rpt 10 yrs  . Joint replacement 1968    Left Knee    No prescriptions prior to admission   No Known Allergies  History  Substance Use Topics  . Smoking status: Never Smoker   . Smokeless tobacco: Never Used  . Alcohol Use: No     Comment: Rare    Family History  Problem Relation Age of Onset  . Stroke Father 77  . Diabetes Father   . Asthma Mother   . Cancer Mother     Vaginal; radiation dz of bowel  . Obesity Sister      Review of Systems  Constitutional: Negative.   HENT: Negative.   Eyes: Negative.   Respiratory: Negative.   Cardiovascular: Negative.   Gastrointestinal: Negative.   Genitourinary: Negative.   Musculoskeletal: Positive for joint pain.  Skin: Negative.   Neurological: Negative.   Endo/Heme/Allergies: Negative.   Psychiatric/Behavioral: Negative.     Objective:  Physical Exam  Constitutional: He is  oriented to person, place, and time. He appears well-nourished.  HENT:  Head: Normocephalic.  Eyes: Pupils are equal, round, and reactive to light.  Neck: Normal range of motion.  Cardiovascular: Normal rate and regular rhythm.   Respiratory: Breath sounds normal.  GI: Bowel sounds are normal.  Musculoskeletal:       Left knee exam: Well-healed surgical incision.  Range of motion 0-9 5.  Pain along the medial joint line and crepitation with motion.  He does walk with an altered gait.  Good neurovascular status distally.  Neurological: He is oriented to person, place, and time.   Skin: Skin is dry.  Psychiatric: He has a normal mood and affect.    Vital signs in last 24 hours:    Labs:   Estimated Body mass index is 33.58 kg/(m^2) as calculated from the following:   Height as of 07/11/12: 5\' 11" (1.803 m).   Weight as of 07/11/12: 240 lb 12 oz(109.203 kg).   Imaging Review Plain radiographs demonstrate severe degenerative joint disease of the left knee(s). The overall alignment isneutral. The bone quality appears to be good for age and reported activity level.  Assessment/Plan:  End stage arthritis, left knee   The patient history, physical examination, clinical judgment of the provider and imaging studies are consistent with end stage degenerative joint disease of the left knee(s) and total knee arthroplasty is deemed medically necessary. The treatment options including medical management, injection therapy arthroscopy and arthroplasty were discussed at length. The risks and benefits of total knee arthroplasty were presented and reviewed. The risks due to aseptic loosening, infection, stiffness, patella tracking problems, thromboembolic complications and other imponderables were discussed. The patient acknowledged the explanation, agreed to proceed with the plan and consent was signed. Patient is being admitted for inpatient treatment for surgery, pain control, PT, OT, prophylactic antibiotics, VTE prophylaxis, progressive ambulation and ADL's and discharge planning. The patient is planning to be discharged home with home health services

## 2012-09-22 ENCOUNTER — Other Ambulatory Visit: Payer: Self-pay | Admitting: Family Medicine

## 2012-09-25 MED ORDER — CHLORHEXIDINE GLUCONATE 4 % EX LIQD: 60.0000 mL | Freq: Once | CUTANEOUS | Status: DC

## 2012-09-25 MED ORDER — CEFAZOLIN SODIUM-DEXTROSE 2-3 GM-% IV SOLR
2.0000 g | INTRAVENOUS | Status: AC
  Administered 2012-09-26: 2 g via INTRAVENOUS
  Filled 2012-09-25: qty 50

## 2012-09-26 ENCOUNTER — Encounter (HOSPITAL_COMMUNITY): Payer: Self-pay | Admitting: Anesthesiology

## 2012-09-26 ENCOUNTER — Encounter (HOSPITAL_COMMUNITY): Payer: Self-pay

## 2012-09-26 ENCOUNTER — Ambulatory Visit (HOSPITAL_COMMUNITY): Payer: BC Managed Care – PPO | Admitting: Anesthesiology

## 2012-09-26 ENCOUNTER — Encounter (HOSPITAL_COMMUNITY)
Admission: RE | Disposition: A | Discharge status: Home Health | Payer: Self-pay | Source: Ambulatory Visit | Attending: Orthopaedic Surgery

## 2012-09-26 ENCOUNTER — Inpatient Hospital Stay (HOSPITAL_COMMUNITY)
Admission: RE | Admit: 2012-09-26 | Discharge: 2012-09-29 | DRG: 209 | Disposition: A | Discharge status: Home Health | Payer: BC Managed Care – PPO | Source: Ambulatory Visit | Attending: Orthopaedic Surgery | Admitting: Orthopaedic Surgery

## 2012-09-26 DIAGNOSIS — M109 Gout, unspecified: Secondary | ICD-10-CM | POA: Diagnosis present

## 2012-09-26 DIAGNOSIS — R42 Dizziness and giddiness: Secondary | ICD-10-CM | POA: Diagnosis present

## 2012-09-26 DIAGNOSIS — Z8719 Personal history of other diseases of the digestive system: Secondary | ICD-10-CM

## 2012-09-26 DIAGNOSIS — M1710 Unilateral primary osteoarthritis, unspecified knee: Principal | ICD-10-CM | POA: Diagnosis present

## 2012-09-26 DIAGNOSIS — Z6833 Body mass index (BMI) 33.0-33.9, adult: Secondary | ICD-10-CM

## 2012-09-26 DIAGNOSIS — E663 Overweight: Secondary | ICD-10-CM | POA: Diagnosis present

## 2012-09-26 DIAGNOSIS — I10 Essential (primary) hypertension: Secondary | ICD-10-CM | POA: Diagnosis present

## 2012-09-26 DIAGNOSIS — G5620 Lesion of ulnar nerve, unspecified upper limb: Secondary | ICD-10-CM | POA: Diagnosis present

## 2012-09-26 DIAGNOSIS — M1712 Unilateral primary osteoarthritis, left knee: Secondary | ICD-10-CM | POA: Diagnosis present

## 2012-09-26 DIAGNOSIS — G4733 Obstructive sleep apnea (adult) (pediatric): Secondary | ICD-10-CM | POA: Diagnosis present

## 2012-09-26 DIAGNOSIS — E785 Hyperlipidemia, unspecified: Secondary | ICD-10-CM | POA: Diagnosis present

## 2012-09-26 DIAGNOSIS — Z96659 Presence of unspecified artificial knee joint: Secondary | ICD-10-CM

## 2012-09-26 DIAGNOSIS — M1610 Unilateral primary osteoarthritis, unspecified hip: Secondary | ICD-10-CM | POA: Diagnosis present

## 2012-09-26 DIAGNOSIS — M169 Osteoarthritis of hip, unspecified: Secondary | ICD-10-CM | POA: Diagnosis present

## 2012-09-26 HISTORY — PX: TOTAL KNEE ARTHROPLASTY: SHX125

## 2012-09-26 SURGERY — ARTHROPLASTY, KNEE, TOTAL
Anesthesia: General | Site: Knee | Laterality: Left | Wound class: Clean

## 2012-09-26 MED ORDER — ONDANSETRON HCL 4 MG/2ML IJ SOLN
INTRAMUSCULAR | Status: DC | PRN
  Administered 2012-09-26: 4 mg via INTRAVENOUS

## 2012-09-26 MED ORDER — ACETAMINOPHEN 325 MG PO TABS: 650.0000 mg | ORAL_TABLET | Freq: Four times a day (QID) | ORAL | Status: DC | PRN

## 2012-09-26 MED ORDER — SODIUM CHLORIDE 0.9 % IR SOLN
Status: DC | PRN
  Administered 2012-09-26: 3000 mL

## 2012-09-26 MED ORDER — LACTATED RINGERS IV SOLN: INTRAVENOUS | Status: DC

## 2012-09-26 MED ORDER — GLYCOPYRROLATE 0.2 MG/ML IJ SOLN
INTRAMUSCULAR | Status: DC | PRN
  Administered 2012-09-26: 0.4 mg via INTRAVENOUS

## 2012-09-26 MED ORDER — ALBUMIN HUMAN 5 % IV SOLN
INTRAVENOUS | Status: DC | PRN
  Administered 2012-09-26: 13:00:00 via INTRAVENOUS

## 2012-09-26 MED ORDER — SENNOSIDES-DOCUSATE SODIUM 8.6-50 MG PO TABS: 1.0000 | ORAL_TABLET | Freq: Every evening | ORAL | Status: DC | PRN

## 2012-09-26 MED ORDER — FENTANYL CITRATE 0.05 MG/ML IJ SOLN
50.0000 ug | Freq: Once | INTRAMUSCULAR | Status: AC
  Administered 2012-09-26: 100 ug via INTRAVENOUS

## 2012-09-26 MED ORDER — ACETAMINOPHEN 10 MG/ML IV SOLN
1000.0000 mg | Freq: Four times a day (QID) | INTRAVENOUS | Status: AC
  Administered 2012-09-26 – 2012-09-27 (×3): 1000 mg via INTRAVENOUS
  Filled 2012-09-26 (×3): qty 100

## 2012-09-26 MED ORDER — LISINOPRIL 10 MG PO TABS
10.0000 mg | ORAL_TABLET | Freq: Every day | ORAL | Status: DC
  Administered 2012-09-26 – 2012-09-29 (×4): 10 mg via ORAL
  Filled 2012-09-26 (×4): qty 1

## 2012-09-26 MED ORDER — HYDROMORPHONE HCL PF 1 MG/ML IJ SOLN
0.5000 mg | INTRAMUSCULAR | Status: DC | PRN
  Administered 2012-09-27: 0.5 mg via INTRAVENOUS
  Filled 2012-09-26 (×2): qty 1

## 2012-09-26 MED ORDER — LIDOCAINE HCL (CARDIAC) 20 MG/ML IV SOLN
INTRAVENOUS | Status: DC | PRN
  Administered 2012-09-26: 50 mg via INTRAVENOUS

## 2012-09-26 MED ORDER — PHENYLEPHRINE HCL 10 MG/ML IJ SOLN
INTRAMUSCULAR | Status: DC | PRN
  Administered 2012-09-26: 40 ug via INTRAVENOUS
  Administered 2012-09-26: 80 ug via INTRAVENOUS
  Administered 2012-09-26: 40 ug via INTRAVENOUS

## 2012-09-26 MED ORDER — CEFAZOLIN SODIUM-DEXTROSE 2-3 GM-% IV SOLR
2.0000 g | Freq: Four times a day (QID) | INTRAVENOUS | Status: AC
  Administered 2012-09-26 – 2012-09-27 (×2): 2 g via INTRAVENOUS
  Filled 2012-09-26 (×2): qty 50

## 2012-09-26 MED ORDER — ACETAMINOPHEN 10 MG/ML IV SOLN
1000.0000 mg | Freq: Once | INTRAVENOUS | Status: AC
  Administered 2012-09-26: 1000 mg via INTRAVENOUS
  Filled 2012-09-26: qty 100

## 2012-09-26 MED ORDER — DIPHENHYDRAMINE HCL 12.5 MG/5ML PO ELIX: 12.5000 mg | ORAL_SOLUTION | ORAL | Status: DC | PRN

## 2012-09-26 MED ORDER — ACETAMINOPHEN 10 MG/ML IV SOLN
INTRAVENOUS | Status: AC
  Filled 2012-09-26: qty 100

## 2012-09-26 MED ORDER — LACTATED RINGERS IV SOLN
INTRAVENOUS | Status: DC
  Administered 2012-09-26: 11:00:00 via INTRAVENOUS

## 2012-09-26 MED ORDER — ALLOPURINOL 150 MG HALF TABLET
150.0000 mg | ORAL_TABLET | Freq: Every day | ORAL | Status: DC
  Administered 2012-09-29: 150 mg via ORAL
  Filled 2012-09-26 (×4): qty 1

## 2012-09-26 MED ORDER — METOCLOPRAMIDE HCL 10 MG PO TABS: 5.0000 mg | ORAL_TABLET | Freq: Three times a day (TID) | ORAL | Status: DC | PRN

## 2012-09-26 MED ORDER — PHENOL 1.4 % MT LIQD: 1.0000 | OROMUCOSAL | Status: DC | PRN

## 2012-09-26 MED ORDER — NEOSTIGMINE METHYLSULFATE 1 MG/ML IJ SOLN
INTRAMUSCULAR | Status: DC | PRN
  Administered 2012-09-26: 3 mg via INTRAVENOUS

## 2012-09-26 MED ORDER — MENTHOL 3 MG MT LOZG: 1.0000 | LOZENGE | OROMUCOSAL | Status: DC | PRN

## 2012-09-26 MED ORDER — METHOCARBAMOL 100 MG/ML IJ SOLN
500.0000 mg | Freq: Four times a day (QID) | INTRAVENOUS | Status: DC | PRN
  Filled 2012-09-26: qty 5

## 2012-09-26 MED ORDER — FERROUS SULFATE 325 (65 FE) MG PO TABS
325.0000 mg | ORAL_TABLET | Freq: Every day | ORAL | Status: DC
  Administered 2012-09-27 – 2012-09-29 (×3): 325 mg via ORAL
  Filled 2012-09-26 (×4): qty 1

## 2012-09-26 MED ORDER — LIDOCAINE HCL 4 % MT SOLN
OROMUCOSAL | Status: DC | PRN
  Administered 2012-09-26: 4 mL via TOPICAL

## 2012-09-26 MED ORDER — ARTIFICIAL TEARS OP OINT
TOPICAL_OINTMENT | OPHTHALMIC | Status: DC | PRN
  Administered 2012-09-26: 1 via OPHTHALMIC

## 2012-09-26 MED ORDER — LACTATED RINGERS IV SOLN
INTRAVENOUS | Status: DC | PRN
  Administered 2012-09-26 (×3): via INTRAVENOUS

## 2012-09-26 MED ORDER — COLCHICINE 0.6 MG PO TABS
0.6000 mg | ORAL_TABLET | Freq: Every day | ORAL | Status: DC
  Administered 2012-09-29: 0.6 mg via ORAL
  Filled 2012-09-26 (×4): qty 1

## 2012-09-26 MED ORDER — OXYCODONE HCL 5 MG PO TABS
5.0000 mg | ORAL_TABLET | ORAL | Status: DC | PRN
  Administered 2012-09-27 – 2012-09-29 (×5): 10 mg via ORAL
  Filled 2012-09-26 (×5): qty 2

## 2012-09-26 MED ORDER — METHOCARBAMOL 500 MG PO TABS
500.0000 mg | ORAL_TABLET | Freq: Four times a day (QID) | ORAL | Status: DC | PRN
  Administered 2012-09-27 (×2): 500 mg via ORAL
  Filled 2012-09-26 (×2): qty 1

## 2012-09-26 MED ORDER — FLEET ENEMA 7-19 GM/118ML RE ENEM: 1.0000 | ENEMA | Freq: Once | RECTAL | Status: AC | PRN

## 2012-09-26 MED ORDER — HYDROMORPHONE HCL PF 1 MG/ML IJ SOLN: 0.2500 mg | INTRAMUSCULAR | Status: DC | PRN

## 2012-09-26 MED ORDER — METOCLOPRAMIDE HCL 5 MG/ML IJ SOLN: 5.0000 mg | Freq: Three times a day (TID) | INTRAMUSCULAR | Status: DC | PRN

## 2012-09-26 MED ORDER — BUPIVACAINE-EPINEPHRINE PF 0.5-1:200000 % IJ SOLN
INTRAMUSCULAR | Status: DC | PRN
  Administered 2012-09-26: 20 mL

## 2012-09-26 MED ORDER — PROPOFOL 10 MG/ML IV BOLUS
INTRAVENOUS | Status: DC | PRN
  Administered 2012-09-26: 200 mg via INTRAVENOUS

## 2012-09-26 MED ORDER — ROCURONIUM BROMIDE 100 MG/10ML IV SOLN
INTRAVENOUS | Status: DC | PRN
  Administered 2012-09-26: 30 mg via INTRAVENOUS

## 2012-09-26 MED ORDER — DOCUSATE SODIUM 100 MG PO CAPS
100.0000 mg | ORAL_CAPSULE | Freq: Two times a day (BID) | ORAL | Status: DC
  Administered 2012-09-26 – 2012-09-29 (×6): 100 mg via ORAL
  Filled 2012-09-26 (×7): qty 1

## 2012-09-26 MED ORDER — ONDANSETRON HCL 4 MG/2ML IJ SOLN: 4.0000 mg | Freq: Once | INTRAMUSCULAR | Status: DC | PRN

## 2012-09-26 MED ORDER — FENTANYL CITRATE 0.05 MG/ML IJ SOLN
INTRAMUSCULAR | Status: DC | PRN
  Administered 2012-09-26 (×6): 50 ug via INTRAVENOUS

## 2012-09-26 MED ORDER — ALUM & MAG HYDROXIDE-SIMETH 200-200-20 MG/5ML PO SUSP: 30.0000 mL | ORAL | Status: DC | PRN

## 2012-09-26 MED ORDER — FENTANYL CITRATE 0.05 MG/ML IJ SOLN
INTRAMUSCULAR | Status: AC
  Administered 2012-09-26: 100 ug via INTRAVENOUS
  Filled 2012-09-26: qty 2

## 2012-09-26 MED ORDER — ASPIRIN EC 325 MG PO TBEC
325.0000 mg | DELAYED_RELEASE_TABLET | Freq: Two times a day (BID) | ORAL | Status: DC
  Administered 2012-09-27 – 2012-09-29 (×5): 325 mg via ORAL
  Filled 2012-09-26 (×7): qty 1

## 2012-09-26 MED ORDER — ACETAMINOPHEN 650 MG RE SUPP: 650.0000 mg | Freq: Four times a day (QID) | RECTAL | Status: DC | PRN

## 2012-09-26 SURGICAL SUPPLY — 60 items
BANDAGE ELASTIC 4 VELCRO ST LF (GAUZE/BANDAGES/DRESSINGS) ×2 IMPLANT
BANDAGE ESMARK 6X9 LF (GAUZE/BANDAGES/DRESSINGS) ×1 IMPLANT
BANDAGE GAUZE ELAST BULKY 4 IN (GAUZE/BANDAGES/DRESSINGS) ×4 IMPLANT
BLADE SAGITTAL 25.0X1.19X90 (BLADE) ×2 IMPLANT
BLADE SURG ROTATE 9660 (MISCELLANEOUS) IMPLANT
BNDG CMPR 9X6 STRL LF SNTH (GAUZE/BANDAGES/DRESSINGS) ×1
BNDG CMPR MED 10X6 ELC LF (GAUZE/BANDAGES/DRESSINGS) ×1
BNDG ELASTIC 6X10 VLCR STRL LF (GAUZE/BANDAGES/DRESSINGS) ×2 IMPLANT
BNDG ESMARK 6X9 LF (GAUZE/BANDAGES/DRESSINGS) ×2
BOWL SMART MIX CTS (DISPOSABLE) ×2 IMPLANT
CEMENT HV SMART SET (Cement) ×4 IMPLANT
CLOTH BEACON ORANGE TIMEOUT ST (SAFETY) ×2 IMPLANT
COVER SURGICAL LIGHT HANDLE (MISCELLANEOUS) ×2 IMPLANT
CUFF TOURNIQUET SINGLE 34IN LL (TOURNIQUET CUFF) ×2 IMPLANT
CUFF TOURNIQUET SINGLE 44IN (TOURNIQUET CUFF) IMPLANT
DRAPE EXTREMITY T 121X128X90 (DRAPE) ×2 IMPLANT
DRAPE PROXIMA HALF (DRAPES) ×2 IMPLANT
DRAPE U-SHAPE 47X51 STRL (DRAPES) ×2 IMPLANT
DRSG ADAPTIC 3X8 NADH LF (GAUZE/BANDAGES/DRESSINGS) ×2 IMPLANT
DRSG PAD ABDOMINAL 8X10 ST (GAUZE/BANDAGES/DRESSINGS) ×2 IMPLANT
DURAPREP 26ML APPLICATOR (WOUND CARE) ×2 IMPLANT
ELECT REM PT RETURN 9FT ADLT (ELECTROSURGICAL) ×2
ELECTRODE REM PT RTRN 9FT ADLT (ELECTROSURGICAL) ×1 IMPLANT
FACESHIELD LNG OPTICON STERILE (SAFETY) ×4 IMPLANT
GLOVE BIO SURGEON STRL SZ8.5 (GLOVE) ×2 IMPLANT
GLOVE BIOGEL PI IND STRL 8 (GLOVE) ×1 IMPLANT
GLOVE BIOGEL PI IND STRL 8.5 (GLOVE) ×1 IMPLANT
GLOVE BIOGEL PI INDICATOR 8 (GLOVE) ×1
GLOVE BIOGEL PI INDICATOR 8.5 (GLOVE) ×1
GLOVE SS BIOGEL STRL SZ 8 (GLOVE) ×1 IMPLANT
GLOVE SUPERSENSE BIOGEL SZ 8 (GLOVE) ×1
GOWN PREVENTION PLUS XLARGE (GOWN DISPOSABLE) ×2 IMPLANT
GOWN PREVENTION PLUS XXLARGE (GOWN DISPOSABLE) ×2 IMPLANT
GOWN STRL NON-REIN LRG LVL3 (GOWN DISPOSABLE) ×2 IMPLANT
HANDPIECE INTERPULSE COAX TIP (DISPOSABLE) ×2
HOOD PEEL AWAY FACE SHEILD DIS (HOOD) ×2 IMPLANT
IMMOBILIZER KNEE 20 (SOFTGOODS)
IMMOBILIZER KNEE 20 THIGH 36 (SOFTGOODS) IMPLANT
IMMOBILIZER KNEE 22 UNIV (SOFTGOODS) ×2 IMPLANT
IMMOBILIZER KNEE 24 THIGH 36 (MISCELLANEOUS) IMPLANT
IMMOBILIZER KNEE 24 UNIV (MISCELLANEOUS)
KIT BASIN OR (CUSTOM PROCEDURE TRAY) ×2 IMPLANT
KIT ROOM TURNOVER OR (KITS) ×2 IMPLANT
MANIFOLD NEPTUNE II (INSTRUMENTS) ×2 IMPLANT
NS IRRIG 1000ML POUR BTL (IV SOLUTION) ×2 IMPLANT
PACK TOTAL JOINT (CUSTOM PROCEDURE TRAY) ×2 IMPLANT
PAD ARMBOARD 7.5X6 YLW CONV (MISCELLANEOUS) ×4 IMPLANT
SET HNDPC FAN SPRY TIP SCT (DISPOSABLE) ×1 IMPLANT
SPONGE GAUZE 4X4 12PLY (GAUZE/BANDAGES/DRESSINGS) ×2 IMPLANT
STAPLER VISISTAT 35W (STAPLE) ×2 IMPLANT
SUCTION FRAZIER TIP 10 FR DISP (SUCTIONS) IMPLANT
SUT VIC AB 0 CT1 27 (SUTURE) ×4
SUT VIC AB 0 CT1 27XBRD ANBCTR (SUTURE) ×2 IMPLANT
SUT VIC AB 2-0 CT1 27 (SUTURE) ×4
SUT VIC AB 2-0 CT1 TAPERPNT 27 (SUTURE) ×2 IMPLANT
SUT VLOC 180 0 24IN GS25 (SUTURE) ×2 IMPLANT
TOWEL OR 17X24 6PK STRL BLUE (TOWEL DISPOSABLE) ×2 IMPLANT
TOWEL OR 17X26 10 PK STRL BLUE (TOWEL DISPOSABLE) ×2 IMPLANT
TRAY FOLEY CATH 14FR (SET/KITS/TRAYS/PACK) ×2 IMPLANT
WATER STERILE IRR 1000ML POUR (IV SOLUTION) ×4 IMPLANT

## 2012-09-26 NOTE — Preoperative (Signed)
Beta Blockers   Reason not to administer Beta Blockers:Not Applicable 

## 2012-09-26 NOTE — Progress Notes (Signed)
Orthopedic Tech Progress Note Patient Details:  Ronald Bullock 11/18/1950 [id number] CPM applied to Left  LE with appropriate settings. OHF applied to bed.  CPM Left Knee CPM Left Knee: On Left Knee Flexion (Degrees): 60 Left Knee Extension (Degrees): 0   Asia R Thompson 09/26/2012, 2:42 PM

## 2012-09-26 NOTE — Anesthesia Procedure Notes (Addendum)
Anesthesia Regional Block:  Femoral nerve block  Pre-Anesthetic Checklist: ,, timeout performed, Correct Patient, Correct Site, Correct Laterality, Correct Procedure, Correct Position, site marked, Risks and benefits discussed,  Surgical consent,  Pre-op evaluation,  At surgeon's request and post-op pain management  Laterality: Left  Prep: Maximum Sterile Barrier Precautions used, chloraprep and alcohol swabs       Needles:  Injection technique: Single-shot  Needle Type: Stimulator Needle - 80        Needle insertion depth: 7 cm   Additional Needles:  Procedures: nerve stimulator Femoral nerve block  Nerve Stimulator or Paresthesia:  Response: 5 mA, 1 ms, 7 cm  Additional Responses:   Narrative:  Start time: 09/26/2012 11:00 AM End time: 09/26/2012 11:05 AM Injection made incrementally with aspirations every 5 mL.  Performed by: Personally  Anesthesiologist: Maren Beach  Additional Notes: Pt accepts p(rocedure and risks. 20cc 0.5% Marcaine w/ epi w/o difficulty or discomfort. GES   Procedure Name: Intubation Date/Time: 09/26/2012 11:59 AM Performed by: Romie Minus K Pre-anesthesia Checklist: Patient identified, Emergency Drugs available, Suction available, Patient being monitored and Timeout performed Patient Re-evaluated:Patient Re-evaluated prior to inductionOxygen Delivery Method: Circle system utilized Preoxygenation: Pre-oxygenation with 100% oxygen Intubation Type: IV induction Ventilation: Mask ventilation without difficulty and Oral airway inserted - appropriate to patient size Laryngoscope Size: Miller and 2 Grade View: Grade I Tube type: Oral Tube size: 7.5 mm Number of attempts: 1 Airway Equipment and Method: Stylet and LTA kit utilized Placement Confirmation: positive ETCO2,  ETT inserted through vocal cords under direct vision,  CO2 detector and breath sounds checked- equal and bilateral Secured at: 21 cm Tube secured with: Tape Dental  Injury: Teeth and Oropharynx as per pre-operative assessment

## 2012-09-26 NOTE — Progress Notes (Signed)
Patient had CPAP ordered.  Patient wears an Auto Titration CPAP at home with a full face mask.  RT placed patient on hospital machine set to Auto Titration mode and a full face mask.  Patient instructed on how to take mask off and how to turn machine off if needed.  RT also made patient aware that if he needs any assistance with the machine throughout the night to let his RN know and they will call RT and we will come assist.  RT will continue to monitor patient.

## 2012-09-26 NOTE — Anesthesia Preprocedure Evaluation (Addendum)
Anesthesia Evaluation  Patient identified by MRN, date of birth, ID band Patient awake    Reviewed: Allergy & Precautions, H&P , NPO status , Patient's Chart, lab work & pertinent test results  History of Anesthesia Complications Negative for: history of anesthetic complications  Airway Mallampati: II TM Distance: >3 FB Neck ROM: full    Dental  (+) Teeth Intact and Dental Advisory Given   Pulmonary sleep apnea and Continuous Positive Airway Pressure Ventilation ,  breath sounds clear to auscultation        Cardiovascular hypertension, Pt. on medications Rhythm:regular Rate:Normal     Neuro/Psych  Neuromuscular disease negative psych ROS   GI/Hepatic negative GI ROS, Neg liver ROS,   Endo/Other  negative endocrine ROS  Renal/GU      Musculoskeletal  (+) Arthritis -, Osteoarthritis,    Abdominal   Peds  Hematology negative hematology ROS (+)   Anesthesia Other Findings   Reproductive/Obstetrics negative OB ROS                          Anesthesia Physical Anesthesia Plan  ASA: III  Anesthesia Plan: General   Post-op Pain Management:    Induction: Intravenous  Airway Management Planned: Oral ETT  Additional Equipment:   Intra-op Plan:   Post-operative Plan: Extubation in OR  Informed Consent: I have reviewed the patients History and Physical, chart, labs and discussed the procedure including the risks, benefits and alternatives for the proposed anesthesia with the patient or authorized representative who has indicated his/her understanding and acceptance.     Plan Discussed with: CRNA, Anesthesiologist and Surgeon  Anesthesia Plan Comments:         Anesthesia Quick Evaluation

## 2012-09-26 NOTE — Interval H&P Note (Signed)
History and Physical Interval Note:  09/26/2012 11:25 AM  Ronald Bullock  has presented today for surgery, with the diagnosis of LEFT KNEE DEGENERATIVE JOINT DISEASE  The various methods of treatment have been discussed with the patient and family. After consideration of risks, benefits and other options for treatment, the patient has consented to  Procedure(s): TOTAL KNEE ARTHROPLASTY (Left) as a surgical intervention .  The patient's history has been reviewed, patient examined, no change in status, stable for surgery.  I have reviewed the patient's chart and labs.  Questions were answered to the patient's satisfaction.     Lorina Duffner G

## 2012-09-26 NOTE — Progress Notes (Signed)
[facility]  Patient Status: New  [facility] is providing the following services: PT - referral from MD office. Thank you  If patient discharges after hours, please call [phone].   Jodene Nam 09/26/2012, 3:48 PM

## 2012-09-26 NOTE — Progress Notes (Signed)
Utilization review completed. Toye Rouillard, RN, BSN. 

## 2012-09-26 NOTE — Anesthesia Postprocedure Evaluation (Signed)
  Anesthesia Post-op Note  Patient: Ronald Bullock  Procedure(s) Performed: Procedure(s): TOTAL KNEE ARTHROPLASTY (Left)  Patient Location: PACU  Anesthesia Type:General  Level of Consciousness: awake  Airway and Oxygen Therapy: Patient Spontanous Breathing  Post-op Pain: mild  Post-op Assessment: Post-op Vital signs reviewed  Post-op Vital Signs: Reviewed  Complications: No apparent anesthesia complications

## 2012-09-26 NOTE — Op Note (Signed)
PREOP DIAGNOSIS: DJD LEFT KNEE POSTOP DIAGNOSIS: DJD LEFT KNEE PROCEDURE: LEFT TKR ANESTHESIA: General and block ATTENDING SURGEON: Kaylene Dawn Bullock ASSISTANT: Lindwood Qua PA  INDICATIONS FOR PROCEDURE: Ronald Bullock is a 62 y.o. male who has struggled for a long time with pain due to degenerative arthritis of the left knee.  The patient has failed many conservative non-operative measures and at this point has pain which limits the ability to sleep and walk.  The patient is offered total knee replacement.  Informed operative consent was obtained after discussion of possible risks of anesthesia, infection, neurovascular injury, DVT, and death.  The importance of the post-operative rehabilitation protocol to optimize result was stressed extensively with the patient.  SUMMARY OF FINDINGS AND PROCEDURE:  Ronald Bullock was taken to the operative suite where under the above anesthesia a left knee replacement was performed.  There were advanced degenerative changes and the bone quality was excellent.  We used the DePuy system and placed size large femur, 6 tibia, 41 mm all polyethylene patella, and a size 10 mm spacer.  The patient was admitted for appropriate post-op care to include perioperative antibiotics and mechanical and pharmacologic measures for DVT prophylaxis.  DESCRIPTION OF PROCEDURE:  NORRIS BRUMBACH was taken to the operative suite where the above anesthesia was applied.  The patient was positioned supine and prepped and draped in normal sterile fashion.  An appropriate time out was performed.  After the administration of Kefzol pre-op antibiotic the leg was elevated and exsanguinated and a tourniquet inflated.  A standard longitudinal incision was made on the anterior knee.  Dissection was carried down to the extensor mechanism.  All appropriate anti-infective measures were used including the pre-operative antibiotic, betadine impregnated drape, and closed hooded exhaust  systems for each member of the surgical team.  A medial parapatellar incision was made in the extensor mechanism and the knee cap flipped and the knee flexed.  Some residual meniscal tissues were removed along with any remaining ACL/PCL tissue.  A guide was placed on the tibia and a flat cut was made on it's superior surface.  An intramedullary guide was placed in the femur and was utilized to make anterior and posterior cuts creating an appropriate flexion gap.  A second intramedullary guide was placed in the femur to make a distal cut properly balancing the knee with an extension gap equal to the flexion gap.  The three bones sized to the above mentioned sizes and the appropriate guides were placed and utilized.  A trial reduction was done and the knee easily came to full extension and the patella tracked well on flexion.  The trial components were removed and all bones were cleaned with pulsatile lavage and then dried thoroughly.  Cement was mixed and was pressurized onto the bones followed by placement of the aforementioned components.  Excess cement was trimmed and pressure was held on the components until the cement had hardened.  The tourniquet was deflated and a small amount of bleeding was controlled with cautery and pressure.  The knee was irrigated thoroughly.  The extensor mechanism was re-approximated with V-loc suture in running fashion.  The knee was flexed and the repair was solid.  The subcutaneous tissues were re-approximated with #0 and #2-0 vicryl and the skin closed with a subcuticular stitch and steristrips.  A sterile dressing was applied.  Intraoperative fluids, EBL, and tourniquet time can be obtained from anesthesia records.  DISPOSITION:  The patient was taken to recovery room in  stable condition and admitted for appropriate post-op care to include peri-operative antibiotic and DVT prophylaxis with mechanical and pharmacologic measures.  Ronald Bullock 09/26/2012, 1:51 PM

## 2012-09-26 NOTE — Transfer of Care (Signed)
Immediate Anesthesia Transfer of Care Note  Patient: Ronald Bullock  Procedure(s) Performed: Procedure(s): TOTAL KNEE ARTHROPLASTY (Left)  Patient Location: PACU  Anesthesia Type:General  Level of Consciousness: alert  and patient cooperative  Airway & Oxygen Therapy: Patient Spontanous Breathing and Patient connected to face mask oxygen  Post-op Assessment: Report given to PACU RN and Post -op Vital signs reviewed and stable  Post vital signs: Reviewed and stable  Complications: No apparent anesthesia complications

## 2012-09-27 LAB — BASIC METABOLIC PANEL
CO2: 27 mEq/L (ref 19–32)
Calcium: 8.7 mg/dL (ref 8.4–10.5)
GFR calc non Af Amer: 68 mL/min — ABNORMAL LOW (ref >90)
Sodium: 132 mEq/L — ABNORMAL LOW (ref 135–145)

## 2012-09-27 LAB — CBC
MCH: 31 pg (ref 26.0–34.0)
Platelets: 175 10*3/uL (ref 150–400)
RBC: 3.87 MIL/uL — ABNORMAL LOW (ref 4.22–5.81)

## 2012-09-27 MED ORDER — METHOCARBAMOL 500 MG PO TABS: 500.0000 mg | ORAL_TABLET | Freq: Four times a day (QID) | ORAL | Status: DC | PRN

## 2012-09-27 MED ORDER — ASPIRIN 325 MG PO TBEC: 325.0000 mg | DELAYED_RELEASE_TABLET | Freq: Two times a day (BID) | ORAL | Status: DC

## 2012-09-27 MED ORDER — OXYCODONE HCL 5 MG PO TABS: 5.0000 mg | ORAL_TABLET | ORAL | Status: DC | PRN

## 2012-09-27 NOTE — Evaluation (Signed)
Physical Therapy Evaluation Patient Details Name: Ronald Bullock MRN: [id number] DOB: 28-Nov-1950 Today's Date: 09/27/2012 Time: 0865-7846 PT Time Calculation (min): 23 min  PT Assessment / Plan / Recommendation Clinical Impression  Pt is a 62 y/o male s/p LTKA.  Pt doing well with mobiity and should progress quickly for d/c home with HHPT.      PT Assessment  Patient needs continued PT services    Follow Up Recommendations  Home health PT    Does the patient have the potential to tolerate intense rehabilitation      Barriers to Discharge None      Equipment Recommendations  Rolling walker with 5" wheels (3 in 1)    Recommendations for Other Services     Frequency 7X/week    Precautions / Restrictions Precautions Precautions: Fall Required Braces or Orthoses: Knee Immobilizer - Left Knee Immobilizer - Left: Discontinue once straight leg raise with < 10 degree lag Restrictions Weight Bearing Restrictions: No LLE Weight Bearing: Weight bearing as tolerated   Pertinent Vitals/Pain 2/10 pain in knee. Pt medicated prior to session.       Mobility  Bed Mobility Bed Mobility: Supine to Sit;Sit to Supine Supine to Sit: 4: Min guard;HOB flat Sit to Supine: 4: Min guard;HOB flat Details for Bed Mobility Assistance: VCs for technique.  Transfers Transfers: Sit to Stand;Stand to Sit Sit to Stand: 4: Min guard;From bed;With upper extremity assist Stand to Sit: 4: Min guard;To chair/3-in-1;With armrests Details for Transfer Assistance: Cues for technique.   Ambulation/Gait Ambulation/Gait Assistance: 4: Min guard Ambulation Distance (Feet): 80 Feet Assistive device: Rolling walker Ambulation/Gait Assistance Details: Cues for gait sequencing and WBAT on LLE Gait Pattern: Step-to pattern Stairs: No    Exercises     PT Diagnosis: Difficulty walking;Generalized weakness;Acute pain  PT Problem List: Decreased strength;Decreased range of motion;Decreased activity  tolerance;Decreased mobility;Pain;Decreased knowledge of use of DME PT Treatment Interventions: DME instruction;Gait training;Stair training;Functional mobility training;Therapeutic activities;Therapeutic exercise;Neuromuscular re-education;Patient/family education   PT Goals Acute Rehab PT Goals PT Goal Formulation: With patient Time For Goal Achievement: 10/04/12 Potential to Achieve Goals: Good Pt will go Supine/Side to Sit: Independently PT Goal: Supine/Side to Sit - Progress: Goal set today Pt will go Sit to Supine/Side: Independently PT Goal: Sit to Supine/Side - Progress: Goal set today Pt will Transfer Bed to Chair/Chair to Bed: Independently PT Transfer Goal: Bed to Chair/Chair to Bed - Progress: Goal set today Pt will Ambulate: >150 feet;with modified independence;with rolling walker PT Goal: Ambulate - Progress: Goal set today Pt will Go Up / Down Stairs: 1-2 stairs;with supervision;with least restrictive assistive device PT Goal: Up/Down Stairs - Progress: Goal set today Pt will Perform Home Exercise Program: Independently PT Goal: Perform Home Exercise Program - Progress: Goal set today  Visit Information  Last PT Received On: 09/27/12 Assistance Needed: +1    Subjective Data  Subjective: agree to PT eval.  Patient Stated Goal: Play golf and bike.     Prior Functioning  Home Living Lives With: Spouse Available Help at Discharge: Available 24 hours/day Type of Home: House Home Access: Stairs to enter [employer] of Steps: 2 Entrance Stairs-Rails: None Home Layout: One level Bathroom Shower/Tub: Walk-in Contractor: Standard Bathroom Accessibility: Yes How Accessible: Accessible via walker Home Adaptive Equipment: Bedside commode/3-in-1;Walker - rolling Prior Function Level of Independence: Independent Able to Take Stairs?: Yes Driving: Yes Vocation: Full time employment Communication Communication: No difficulties Dominant  Hand: Right    Cognition  Cognition Overall  Cognitive Status: Appears within functional limits for tasks assessed/performed Arousal/Alertness: Awake/alert Orientation Level: Appears intact for tasks assessed Behavior During Session: [name] for tasks performed    Extremity/Trunk Assessment Right Lower Extremity Assessment RLE ROM/Strength/Tone: [name] for tasks assessed Left Lower Extremity Assessment LLE ROM/Strength/Tone: Deficits LLE ROM/Strength/Tone Deficits: ROM and strength limited secondary to surgery.    Balance    End of Session PT - End of Session Equipment Utilized During Treatment: Gait belt;Left knee immobilizer Activity Tolerance: Patient tolerated treatment well Patient left: in chair;with call bell/phone within reach Nurse Communication: Mobility status CPM Left Knee CPM Left Knee: Off  GP     Ronald Bullock 09/27/2012, 4:08 PM Ronald Bullock L. [name] DPT [phone]

## 2012-09-27 NOTE — Evaluation (Signed)
Occupational Therapy Evaluation Patient Details Name: Ronald Bullock MRN: [id number] DOB: October 04, 1950 Today's Date: 09/27/2012 Time: 0865-7846 OT Time Calculation (min): 35 min  OT Assessment / Plan / Recommendation Clinical Impression  Pleasant 62 yr old male admitted for elective LTKA.  Pt overall supervision for mobility and selfcare with use of AE and DME.  Will have assistance from wife at discharge.  No further OT needs.  Pt will need 3:1 for home.    OT Assessment  Patient needs continued OT Services;Patient does not need any further OT services    Follow Up Recommendations  No OT follow up       Equipment Recommendations  3 in 1 bedside comode          Precautions / Restrictions Precautions Precautions: Fall Required Braces or Orthoses: Knee Immobilizer - Left Knee Immobilizer - Left: Discontinue once straight leg raise with < 10 degree lag Restrictions Weight Bearing Restrictions: No LLE Weight Bearing: Weight bearing as tolerated   Pertinent Vitals/Pain Vitals stable    ADL  Eating/Feeding: Simulated;Independent Grooming: Simulated;Supervision/safety Where Assessed - Grooming: Supported standing Upper Body Bathing: Simulated;Set up Where Assessed - Upper Body Bathing: Unsupported sitting Lower Body Bathing: Simulated;Minimal assistance Where Assessed - Lower Body Bathing: Supported sit to stand Upper Body Dressing: Simulated;Supervision/safety Where Assessed - Upper Body Dressing: Unsupported sitting Lower Body Dressing: Simulated;Moderate assistance Where Assessed - Lower Body Dressing: Supported sit to Pharmacist, hospital: Therapist, sports: Materials engineer and Hygiene: Simulated;Supervision/safety Where Assessed - Engineer, mining and Hygiene: Sit to stand from 3-in-1 or toilet Tub/Shower Transfer: Simulated;Supervision/safety Tub/Shower Transfer Method:  Ambulating;Other (comment) (step over edge of the walk-in shower) Equipment Used: Knee Immobilizer;Rolling walker Transfers/Ambulation Related to ADLs: Pt overall close supervision for mobility using the RW and KI ADL Comments: Pt with history of decreased flexibility for reaching his feet.  Demonstrated use of sockaide as well as educating pt on use of reacher as well.  Will have assistance from wife at discharge.  Will need 3:1 for use over the toilet and walk-in shower.         Visit Information  Last OT Received On: 09/27/12 Assistance Needed: +1    Subjective Data  Subjective: I wanted to go home today but they can't get my equipment ready Patient Stated Goal: Wants to get done with the need and have the hip done later this year.   Prior Functioning     Home Living Lives With: Spouse Available Help at Discharge: Available 24 hours/day Type of Home: House Home Access: Stairs to enter [employer] of Steps: 2 Entrance Stairs-Rails: None Home Layout: One level Bathroom Shower/Tub: Walk-in Contractor: Standard Bathroom Accessibility: Yes How Accessible: Accessible via walker Home Adaptive Equipment: Bedside commode/3-in-1;Walker - rolling Prior Function Level of Independence: Independent Able to Take Stairs?: Yes Driving: Yes Vocation: Full time employment Communication Communication: No difficulties Dominant Hand: Right         Vision/Perception Vision - History Baseline Vision: No visual deficits Patient Visual Report: No change from baseline Vision - Assessment Eye Alignment: Within Functional Limits Perception Perception: Within Functional Limits Praxis Praxis: Intact   Cognition  Cognition Overall Cognitive Status: Appears within functional limits for tasks assessed/performed Arousal/Alertness: Awake/alert Orientation Level: Appears intact for tasks assessed Behavior During Session: [facility] for tasks performed     Extremity/Trunk Assessment Right Upper Extremity Assessment RUE ROM/Strength/Tone: Within functional levels Left Upper Extremity Assessment LUE ROM/Strength/Tone: Within functional levels Right Lower  Extremity Assessment RLE ROM/Strength/Tone: [facility] for tasks assessed Left Lower Extremity Assessment LLE ROM/Strength/Tone: Deficits LLE ROM/Strength/Tone Deficits: ROM and strength limited secondary to surgery.  Trunk Assessment Trunk Assessment: Normal     Mobility Bed Mobility Bed Mobility: Supine to Sit Supine to Sit: 5: Supervision Sit to Supine: 5: Supervision Details for Bed Mobility Assistance: VCs for technique.  Transfers Transfers: Sit to Stand Sit to Stand: 5: Supervision;With upper extremity assist;From bed Stand to Sit: 5: Supervision;With upper extremity assist Details for Transfer Assistance: Cues for technique.       Exercise Total Joint Exercises Ankle Circles/Pumps: 10 reps Quad Sets: 10 reps Heel Slides: 10 reps Straight Leg Raises: 10 reps;Left (WIth KI on. )   Balance Balance Balance Assessed: Yes Dynamic Standing Balance Dynamic Standing - Level of Assistance: 5: Stand by assistance   End of Session OT - End of Session Equipment Utilized During Treatment: Right knee immobilizer CPM Left Knee CPM Left Knee: Off Left Knee Flexion (Degrees): 60 Left Knee Extension (Degrees): 0     Doll Frazee OTR/L 09/27/2012, 4:14 PM

## 2012-09-27 NOTE — Progress Notes (Signed)
Subjective: 1 Day Post-Op Procedure(s) (LRB): TOTAL KNEE ARTHROPLASTY (Left) Using cpap  Activity level:  wbat Diet tolerance:  eating Voiding:  ok Patient reports pain as 3 on 0-10 scale.    Objective: Vital signs in last 24 hours: Temp:  [97.7 F (36.5 C)-99.1 F (37.3 C)] 99.1 F (37.3 C) (02/12 0504) Pulse Rate:  [69-82] 78 (02/12 0504) Resp:  [16-20] 16 (02/12 0504) BP: (129-151)/(61-75) 129/63 mmHg (02/12 0504) SpO2:  [96 %-100 %] 100 % (02/12 0504)  Labs:  Recent Labs  09/27/12 0415  HGB 12.0*    Recent Labs  09/27/12 0415  WBC 8.1  RBC 3.87*  HCT 34.2*  PLT 175    Recent Labs  09/27/12 0415  NA 132*  K 5.0  CL 98  CO2 27  BUN 20  CREATININE 1.12  GLUCOSE 140*  CALCIUM 8.7   No results found for this basename: LABPT, INR,  in the last 72 hours  Physical Exam:  Neurologically intact ABD soft Neurovascular intact Sensation intact distally Intact pulses distally Dorsiflexion/Plantar flexion intact Incision: dressing C/D/I No cellulitis present Compartment soft  Assessment/Plan:  1 Day Post-Op Procedure(s) (LRB): TOTAL KNEE ARTHROPLASTY (Left) Advance diet Up with therapy D/C IV fluids Discharge home with home health on thurs or fri D/c iv   ASA 325 1 po BID WBAT    Ronald Bullock 09/27/2012, 8:11 AM

## 2012-09-27 NOTE — Progress Notes (Addendum)
Subjective: 1 Day Post-Op Procedure(s) (LRB): TOTAL KNEE ARTHROPLASTY (Left) Wants to go home to try and beat the snow. Also ok with therapy to do so .HH can not get equipment to house for s day or two. [facility] till equip. Is delivered.Probably thu or fri Activity level:  wbat Diet tolerance:  eating Voiding:  ok Patient reports pain as 2 on 0-10 scale.    Objective: Vital signs in last 24 hours: Temp:  [97.7 F (36.5 C)-99.1 F (37.3 C)] 99.1 F (37.3 C) (02/12 0504) Pulse Rate:  [69-80] 78 (02/12 0504) Resp:  [16-18] 16 (02/12 0504) BP: (129-140)/(61-65) 129/63 mmHg (02/12 0504) SpO2:  [98 %-100 %] 100 % (02/12 0504)  Labs:  Recent Labs  09/27/12 0415  HGB 12.0*    Recent Labs  09/27/12 0415  WBC 8.1  RBC 3.87*  HCT 34.2*  PLT 175    Recent Labs  09/27/12 0415  NA 132*  K 5.0  CL 98  CO2 27  BUN 20  CREATININE 1.12  GLUCOSE 140*  CALCIUM 8.7   No results found for this basename: LABPT, INR,  in the last 72 hours  Physical Exam:  Neurologically intact ABD soft Neurovascular intact Sensation intact distally Intact pulses distally Dorsiflexion/Plantar flexion intact Incision: dressing C/D/I No cellulitis present Compartment soft  Assessment/Plan:  1 Day Post-Op Procedure(s) (LRB): TOTAL KNEE ARTHROPLASTY (Left) Advance diet Up with therapy D/C IV fluids Discharge home with home health thurs or fri ASA 325 bid x 2 weeks    Dwain Huhn R 09/27/2012, 1:37 PM

## 2012-09-27 NOTE — Progress Notes (Signed)
PT PROGRESS NOTE:  09/27/12 1400  PT Visit Information  Last PT Received On 09/27/12  PT Time Calculation  PT Start Time 1400  PT Stop Time 1420  PT Time Calculation (min) 20 min  Restrictions  Weight Bearing Restrictions Yes  LLE Weight Bearing WBAT  Cognition  Overall Cognitive Status Appears within functional limits for tasks assessed/performed  Arousal/Alertness Awake/alert  Orientation Level Appears intact for tasks assessed  Behavior During Session [facility] for tasks performed  Bed Mobility  Bed Mobility Not assessed  Transfers  Transfers Not assessed  Ambulation/Gait  Ambulation/Gait Assistance Not tested (comment)  Exercises  Exercises Total Joint  Total Joint Exercises  Quad Sets 10 reps  Ankle Circles/Pumps 10 reps  Heel Slides 10 reps  Straight Leg Raises 10 reps;Left (WIth KI on. )  PT - End of Session  Equipment Utilized During Treatment Right knee immobilizer  Activity Tolerance Patient tolerated treatment well  Patient left in bed;in CPM;with call bell/phone within reach  Nurse Communication Mobility status  PT - Assessment/Plan  Comments on Treatment Session Pt tolerating well.  PT Plan Frequency remains appropriate;Discharge plan remains appropriate  PT Frequency 7X/week  Follow Up Recommendations Home health PT  PT equipment Rolling walker with 5" wheels;Other (comment) (3 in 1)  Acute Rehab PT Goals  PT Goal Formulation With patient  Time For Goal Achievement 10/04/12  Potential to Achieve Goals Good  Pt will Perform Home Exercise Program Independently  PT Goal: Perform Home Exercise Program - Progress Progressing toward goal  PT General Charges  $$ ACUTE PT VISIT 1 Procedure  PT Treatments  $Therapeutic Exercise 8-22 mins  Shuna Tabor L. Praise Stennett DPT [phone]

## 2012-09-27 NOTE — Progress Notes (Signed)
Physical Therapy Treatment Patient Details Name: Ronald Bullock MRN: [id number] DOB: [birth year] Today's Date: 09/27/2012 Time: 4098-1191 PT Time Calculation (min): 53 min  PT Assessment / Plan / Recommendation Comments on Treatment Session  Pt presents with weakness L knee extensors.Will focus next session on strengthening.  Pt ROM improving 0-90 degrees this session.      Follow Up Recommendations  Home health PT     Does the patient have the potential to tolerate intense rehabilitation     Barriers to Discharge None      Equipment Recommendations  Rolling walker with 5" wheels    Recommendations for Other Services    Frequency 7X/week   Plan Frequency remains appropriate;Discharge plan remains appropriate    Precautions / Restrictions Precautions Precautions: Fall Required Braces or Orthoses: Knee Immobilizer - Left Knee Immobilizer - Left: Discontinue once straight leg raise with < 10 degree lag Restrictions Weight Bearing Restrictions: No LLE Weight Bearing: Weight bearing as tolerated   Pertinent Vitals/Pain 3/10 pain in knee. Pt medicated prior to session.    Mobility  Bed Mobility Bed Mobility: Supine to Sit;Sit to Supine Supine to Sit: 5: Supervision;HOB flat Sit to Supine: 4: Min assist;HOB flat Details for Bed Mobility Assistance: Min assist for LLE with no KI moving to the Left side of the bed which is the side he sleeps on at home. Instructed pt to use leg lifter. Pt presents with poor quadraceps control unable to maintain knee extension.  Transfers Transfers: Sit to Stand;Stand to Sit Sit to Stand: 6: Modified independent (Device/Increase time);From bed;From chair/3-in-1;With upper extremity assist Stand to Sit: 6: Modified independent (Device/Increase time);To bed;To chair/3-in-1 Details for Transfer Assistance: Cues for technique.   Ambulation/Gait Ambulation/Gait Assistance: 5: Supervision Ambulation Distance (Feet): 100 Feet (100 feet x  2) Assistive device: Rolling walker Ambulation/Gait Assistance Details: Cues for gait sequencing and WBAT on LLE Gait Pattern: Step-to pattern Stairs: No    Exercises Total Joint Exercises Ankle Circles/Pumps: 10 reps Quad Sets: 10 reps Short Arc Quad: AAROM;10 reps;Supine Heel Slides: Left;10 reps;Supine;AAROM Knee Flexion: Left;5 reps;Seated Goniometric ROM: AAROM of L knee 0-90 degrees in sitting.    PT Diagnosis: Difficulty walking;Generalized weakness;Acute pain  PT Problem List: Decreased strength;Decreased range of motion;Decreased activity tolerance;Decreased mobility;Pain;Decreased knowledge of use of DME PT Treatment Interventions: DME instruction;Gait training;Stair training;Functional mobility training;Therapeutic activities;Therapeutic exercise;Neuromuscular re-education;Patient/family education   PT Goals Acute Rehab PT Goals PT Goal Formulation: With patient Time For Goal Achievement: 10/04/12 Potential to Achieve Goals: Good Pt will go Supine/Side to Sit: Independently PT Goal: Supine/Side to Sit - Progress: Progressing toward goal Pt will go Sit to Supine/Side: Independently PT Goal: Sit to Supine/Side - Progress: Progressing toward goal Pt will Transfer Bed to Chair/Chair to Bed: Independently PT Transfer Goal: Bed to Chair/Chair to Bed - Progress: Progressing toward goal Pt will Ambulate: >150 feet;with modified independence;with rolling walker PT Goal: Ambulate - Progress: Progressing toward goal Pt will Go Up / Down Stairs: 1-2 stairs;with supervision;with least restrictive assistive device PT Goal: Up/Down Stairs - Progress: Progressing toward goal Pt will Perform Home Exercise Program: Independently PT Goal: Perform Home Exercise Program - Progress: Progressing toward goal  Visit Information  Last PT Received On: 09/27/12 Assistance Needed: +1    Subjective Data  Subjective: I feel pretty good.  Patient Stated Goal: Play golf and bike.      Cognition  Cognition Overall Cognitive Status: Appears within functional limits for tasks assessed/performed Arousal/Alertness: Awake/alert Orientation Level: Appears intact for tasks  assessed Behavior During Session: [facility] for tasks performed    Balance  Balance Balance Assessed: No Dynamic Standing Balance Dynamic Standing - Level of Assistance: 5: Stand by assistance  End of Session PT - End of Session Equipment Utilized During Treatment: Gait belt;Left knee immobilizer Activity Tolerance: Patient tolerated treatment well Patient left: in chair;with call bell/phone within reach Nurse Communication: Mobility status CPM Left Knee CPM Left Knee: Off   GP     Tasharra Nodine 09/27/2012, 7:24 PM Dorean Daniello L. Crissa Sowder DPT [phone]

## 2012-09-27 NOTE — Progress Notes (Signed)
CARE MANAGEMENT NOTE 09/27/2012  Patient:  Ronald Bullock, Ronald Bullock   Account Number:  [id number]  Date Initiated:  09/27/2012  Documentation initiated by:  Vance Peper  Subjective/Objective Assessment:   62 yr old male s/p left total knee arthroplasty.     Action/Plan:   CM spoke with patient and wife concerning home health and DME needs at discharge. Choice offered. Patient preoperatively setup with [facility], no changes. Patient has rolling walker, 3in1 and CPM to be delivered to his home.   Anticipated DC Date:  09/29/2012   Anticipated DC Plan:  HOME W HOME HEALTH SERVICES      DC Planning Services  CM consult      PAC Choice  DURABLE MEDICAL EQUIPMENT  HOME HEALTH   Choice offered to / List presented to:  C-1 Patient   DME arranged  3-N-1  CPM      DME agency  [employer]     HH arranged  HH-2 PT      HH agency  [facility]   Status of service:  Completed, signed off Medicare Important Message given?   (If response is "NO", the following Medicare IM given date fields will be blank) Date Medicare IM given:   Date Additional Medicare IM given:

## 2012-09-27 NOTE — Care Management (Signed)
Pt placed on cpap for the night. tol well at this time 

## 2012-09-28 ENCOUNTER — Encounter (HOSPITAL_COMMUNITY): Payer: Self-pay | Admitting: Orthopaedic Surgery

## 2012-09-28 LAB — CBC
Hemoglobin: 11 g/dL — ABNORMAL LOW (ref 13.0–17.0)
MCV: 88.6 fL (ref 78.0–100.0)
Platelets: 171 10*3/uL (ref 150–400)
RBC: 3.59 MIL/uL — ABNORMAL LOW (ref 4.22–5.81)
WBC: 9.2 10*3/uL (ref 4.0–10.5)

## 2012-09-28 NOTE — Progress Notes (Signed)
Physical Therapy Treatment Patient Details Name: Ronald Bullock MRN: [id number] DOB: [birth year] Today's Date: 09/28/2012 Time: 5784-6962 PT Time Calculation (min): 25 min  PT Assessment / Plan / Recommendation Comments on Treatment Session  Session limited by pain. Pt reports not having pain medication since last night. Placed pt in CPM with gel ice packs on knee and proximal femur.     Follow Up Recommendations  Home health PT     Does the patient have the potential to tolerate intense rehabilitation     Barriers to Discharge None      Equipment Recommendations  Rolling walker with 5" wheels    Recommendations for Other Services    Frequency 7X/week   Plan Frequency remains appropriate;Discharge plan remains appropriate    Precautions / Restrictions Precautions Precautions: Fall Required Braces or Orthoses: Knee Immobilizer - Left Knee Immobilizer - Left: Discontinue once straight leg raise with < 10 degree lag Restrictions Weight Bearing Restrictions: Yes LLE Weight Bearing: Weight bearing as tolerated   Pertinent Vitals/Pain 8/10 pain in knee.  Applied ice packs and notified RN who gave pt oral pain medication.      Mobility  Bed Mobility Bed Mobility: Sit to Supine Sit to Supine: 4: Min assist;HOB flat Details for Bed Mobility Assistance: Min assist for LLE secondary to increased pain.   Transfers Transfers: Sit to Stand;Stand to Sit Sit to Stand: 6: Modified independent (Device/Increase time);From bed;From chair/3-in-1;With upper extremity assist Stand to Sit: 6: Modified independent (Device/Increase time);To bed;To chair/3-in-1 Ambulation/Gait Ambulation/Gait Assistance: Not tested (comment)    Exercises Total Joint Exercises Heel Slides: Left;10 reps;Supine;AAROM   PT Diagnosis: Difficulty walking;Generalized weakness;Acute pain  PT Problem List: Decreased strength;Decreased range of motion;Decreased activity tolerance;Decreased mobility;Pain;Decreased  knowledge of use of DME PT Treatment Interventions: DME instruction;Gait training;Stair training;Functional mobility training;Therapeutic activities;Therapeutic exercise;Neuromuscular re-education;Patient/family education   PT Goals Acute Rehab PT Goals PT Goal Formulation: With patient Time For Goal Achievement: 10/04/12 Potential to Achieve Goals: Good Pt will go Supine/Side to Sit: Independently PT Goal: Supine/Side to Sit - Progress: Progressing toward goal Pt will go Sit to Supine/Side: Independently PT Goal: Sit to Supine/Side - Progress: Progressing toward goal Pt will Transfer Bed to Chair/Chair to Bed: Independently PT Transfer Goal: Bed to Chair/Chair to Bed - Progress: Goal set today  Visit Information  Last PT Received On: 09/28/12 Assistance Needed: +1    Subjective Data  Subjective: Pain is worse today. I have not had any pain medication.   Patient Stated Goal: Play golf and bike.     Cognition  Cognition Overall Cognitive Status: Appears within functional limits for tasks assessed/performed Arousal/Alertness: Awake/alert Orientation Level: Appears intact for tasks assessed Behavior During Session: [facility] for tasks performed    Balance     End of Session PT - End of Session Equipment Utilized During Treatment: Gait belt;Left knee immobilizer Activity Tolerance: Patient tolerated treatment well Patient left: in chair;with call bell/phone within reach Nurse Communication: Mobility status CPM Left Knee CPM Left Knee: On Left Knee Flexion (Degrees): 50 Left Knee Extension (Degrees): 0 Additional Comments: CPM on at    GP     [facility] 09/28/2012, 10:23 AM  Keymani Mclean L. Dyesha Henault DPT [phone]

## 2012-09-28 NOTE — Progress Notes (Signed)
Physical Therapy Treatment Patient Details Name: Ronald Bullock MRN: [id number] DOB: March 06, 1951 Today's Date: 09/28/2012 Time: 4098-1191 PT Time Calculation (min): 23 min  PT Assessment / Plan / Recommendation Comments on Treatment Session  pt mobility continues to improve should be able to d/c home tomorrow.     Follow Up Recommendations  Home health PT     Does the patient have the potential to tolerate intense rehabilitation     Barriers to Discharge        Equipment Recommendations  Rolling walker with 5" wheels    Recommendations for Other Services    Frequency 7X/week   Plan Frequency remains appropriate;Discharge plan remains appropriate    Precautions / Restrictions Precautions Precautions: Fall Required Braces or Orthoses: Knee Immobilizer - Left Knee Immobilizer - Left: Discontinue once straight leg raise with < 10 degree lag Restrictions Weight Bearing Restrictions: Yes LLE Weight Bearing: Weight bearing as tolerated   Pertinent Vitals/Pain 4-5/10 pain in knee.  No intervention required.     Mobility  Bed Mobility Bed Mobility: Not assessed Transfers Transfers: Not assessed Ambulation/Gait Ambulation/Gait Assistance: 6: Modified independent (Device/Increase time) Ambulation Distance (Feet): 250 Feet Assistive device: Rolling walker Gait Pattern: Step-to pattern Stairs: No    Exercises     PT Diagnosis:    PT Problem List:   PT Treatment Interventions:     PT Goals Acute Rehab PT Goals PT Goal Formulation: With patient Time For Goal Achievement: 10/04/12 Potential to Achieve Goals: Good Pt will go Supine/Side to Sit: Independently  Visit Information  Last PT Received On: 09/28/12 Assistance Needed: +1    Subjective Data  Subjective: less pain than this morning but still sore Patient Stated Goal: Play golf and bike.     Cognition  Cognition Overall Cognitive Status: Appears within functional limits for tasks  assessed/performed Arousal/Alertness: Awake/alert Orientation Level: Appears intact for tasks assessed Behavior During Session: [facility] for tasks performed    Balance  Balance Balance Assessed: No  End of Session PT - End of Session Equipment Utilized During Treatment: Gait belt;Left knee immobilizer Activity Tolerance: Patient tolerated treatment well Patient left: in chair;with call bell/phone within reach Nurse Communication: Mobility status   GP     Shaquala Broeker 09/28/2012, 2:26 PM Mykenzie Ebanks L. Story Vanvranken DPT [phone]

## 2012-09-28 NOTE — Progress Notes (Signed)
Subjective: 2 Days Post-Op Procedure(s) (LRB): TOTAL KNEE ARTHROPLASTY (Left) Doing good with therapy Activity level:  wbat Diet tolerance:  Ok  Voiding:  ok Patient reports pain as 3 on 0-10 scale.    Objective: Vital signs in last 24 hours: Temp:  [98.2 F (36.8 C)-102.5 F (39.2 C)] 99.9 F (37.7 C) (02/13 0627) Pulse Rate:  [86-101] 86 (02/13 0627) Resp:  [16-18] 16 (02/13 0800) BP: (98-119)/(51-68) 119/68 mmHg (02/13 0900) SpO2:  [96 %-99 %] 96 % (02/13 0627)  Labs:  Recent Labs  09/27/12 0415 09/28/12 0610  HGB 12.0* 11.0*    Recent Labs  09/27/12 0415 09/28/12 0610  WBC 8.1 9.2  RBC 3.87* 3.59*  HCT 34.2* 31.8*  PLT 175 171    Recent Labs  09/27/12 0415  NA 132*  K 5.0  CL 98  CO2 27  BUN 20  CREATININE 1.12  GLUCOSE 140*  CALCIUM 8.7   No results found for this basename: LABPT, INR,  in the last 72 hours  Physical Exam:  Neurologically intact ABD soft Neurovascular intact Sensation intact distally Intact pulses distally Dorsiflexion/Plantar flexion intact Incision: dressing C/D/I No cellulitis present Compartment soft Dressing changed yesterday  Assessment/Plan:  2 Days Post-Op Procedure(s) (LRB): TOTAL KNEE ARTHROPLASTY (Left) Advance diet Up with therapy D/C IV fluids Plan for discharge tomorrow if roads passable RX's in chart ASA 325 BID for 2 weeks    Ronald Bullock 09/28/2012, 12:44 PM

## 2012-09-29 MED ORDER — COLCHICINE 0.6 MG PO TABS: 0.6000 mg | ORAL_TABLET | Freq: Two times a day (BID) | ORAL | Status: DC

## 2012-09-29 NOTE — Progress Notes (Signed)
Patient discharged in stable condition via wheelchair. Discharge instructions and prescriptions were given and explained 

## 2012-09-29 NOTE — Progress Notes (Signed)
PHYSICAL THERAPY PROGRESS NOTE.    09/29/12 1000  PT Visit Information  Last PT Received On 09/29/12  Assistance Needed +1  PT Time Calculation  PT Start Time 1000  PT Stop Time 1102  PT Time Calculation (min) 62 min  Subjective Data  Subjective Knee feels a lot better today  Patient Stated Goal Play golf and bike.    Precautions  Precautions Fall  Required Braces or Orthoses Knee Immobilizer - Left  Knee Immobilizer - Left Discontinue once straight leg raise with < 10 degree lag  Restrictions  Weight Bearing Restrictions Yes  LLE Weight Bearing WBAT  Cognition  Overall Cognitive Status Appears within functional limits for tasks assessed/performed  Arousal/Alertness Awake/alert  Orientation Level Appears intact for tasks assessed  Behavior During Session [facility] for tasks performed  Bed Mobility  Bed Mobility Supine to Sit;Sit to Supine  Supine to Sit 6: Modified independent (Device/Increase time)  Sit to Supine 6: Modified independent (Device/Increase time)  Transfers  Transfers Sit to Stand;Stand to Sit  Sit to Stand 6: Modified independent (Device/Increase time)  Stand to Sit 6: Modified independent (Device/Increase time)  Ambulation/Gait  Ambulation/Gait Assistance 6: Modified independent (Device/Increase time)  Ambulation Distance (Feet) 150 Feet  Assistive device Rolling walker  Ambulation/Gait Assistance Details no assistance required.   Gait Pattern Step-to pattern  Stairs Yes  Stairs Assistance 6: Modified independent (Device/Increase time);5: Supervision  Stairs Assistance Details (indicate cue type and reason) instructed pt in forward and backward techniques  Stair Management Technique No rails;With walker  Number of Stairs 1 (2 trials)  Wheelchair Mobility  Wheelchair Mobility No  Exercises  Exercises Total Joint  Total Joint Exercises  Quad Sets 10 reps  Ankle Circles/Pumps 10 reps  Short Arc Quad AROM;10 reps;Supine;Left  Heel Slides Left;10  reps;Supine;AROM  Hip ABduction/ADduction 10 reps;AROM;Left  Straight Leg Raises AROM;10 reps;Supine;Left  Knee Flexion Left;5 reps;Seated  Goniometric ROM 0-90 degrees AAROM  PT - End of Session  Equipment Utilized During Treatment Gait belt;Left knee immobilizer  Activity Tolerance Patient tolerated treatment well  Patient left in chair;with call bell/phone within reach  Nurse Communication Mobility status  PT - Assessment/Plan  Comments on Treatment Session Pt no longer needs KI. Demonstrating good quad strength in LLE.  All acute PT goals met.  Provided pt with HEP handouts.     PT Plan All goals met and education completed, patient dischaged from PT services  PT Frequency 7X/week  Follow Up Recommendations Home health PT  PT equipment Rolling walker with 5" wheels  Acute Rehab PT Goals  PT Goal Formulation With patient  Time For Goal Achievement 10/04/12  Potential to Achieve Goals Good  Pt will go Supine/Side to Sit Independently  PT Goal: Supine/Side to Sit - Progress Met  Pt will go Sit to Supine/Side Independently  PT Goal: Sit to Supine/Side - Progress Met  Pt will Transfer Bed to Chair/Chair to Bed Independently  PT Transfer Goal: Bed to Chair/Chair to Bed - Progress Met  Pt will Ambulate >150 feet;with modified independence;with rolling walker  PT Goal: Ambulate - Progress Met  Pt will Go Up / Down Stairs 1-2 stairs;with supervision;with least restrictive assistive device  PT Goal: Up/Down Stairs - Progress Met  Pt will Perform Home Exercise Program Independently  PT Goal: Perform Home Exercise Program - Progress Met  PT General Charges  $$ ACUTE PT VISIT 1 Procedure  PT Treatments  $Gait Training 8-22 mins  $Therapeutic Exercise 23-37 mins  $Therapeutic Activity 8-22  mins  Milca Sytsma L. Quadre Bristol DPT [phone]

## 2012-09-29 NOTE — Discharge Summary (Signed)
Patient ID: Ronald Bullock MRN: [id number] DOB/AGE: 02-15-1951 62 y.o.  Admit date: 09/26/2012 Discharge date: 09/29/2012  Admission Diagnoses:  Principal Problem:   Left knee DJD   Discharge Diagnoses:  Same  Past Medical History  Diagnosis Date  . HLD (hyperlipidemia)     Low HDL  . Arthritis     L end stage knee DKD, mild R knee DJD, R hip end stage DJD  . OSA on CPAP   . Gout     rare flares  . Hyperglycemia   . Hypertension     Surgeries: Procedure(s): TOTAL KNEE ARTHROPLASTY on 09/26/2012   Consultants:    Discharged Condition: Improved  Hospital Course: JAYMON DUDEK is an 62 y.o. male who was admitted 09/26/2012 for operative treatment ofLeft knee DJD. Patient has severe unremitting pain that affects sleep, daily activities, and work/hobbies. After pre-op clearance the patient was taken to the operating room on 09/26/2012 and underwent  Procedure(s): TOTAL KNEE ARTHROPLASTY.    Patient was given perioperative antibiotics: Anti-infectives   Start     Dose/Rate Route Frequency Ordered Stop   09/26/12 1800  ceFAZolin (ANCEF) IVPB 2 g/50 mL premix     2 g 100 mL/hr over 30 Minutes Intravenous Every 6 hours 09/26/12 1604 09/27/12 0107   09/26/12 0600  ceFAZolin (ANCEF) IVPB 2 g/50 mL premix     2 g 100 mL/hr over 30 Minutes Intravenous On call to O.R. 09/25/12 1425 09/26/12 1155       Patient was given sequential compression devices, early ambulation, and chemoprophylaxis to prevent DVT.  Patient benefited maximally from hospital stay and there were no complications.    Recent vital signs: Patient Vitals for the past 24 hrs:  BP Temp Temp src Pulse Resp SpO2  09/29/12 0700 103/50 mmHg 98.3 F (36.8 C) - 87 18 98 %  09/29/12 0054 - - - - 18 -  09/28/12 2059 118/63 mmHg 99.7 F (37.6 C) - 92 18 98 %  09/28/12 1400 113/59 mmHg 100.7 F (38.2 C) Oral 94 20 99 %  09/28/12 1200 - - - - 18 -     Recent laboratory studies:  Recent Labs   09/27/12 0415 09/28/12 0610  WBC 8.1 9.2  HGB 12.0* 11.0*  HCT 34.2* 31.8*  PLT 175 171  NA 132*  --   K 5.0  --   CL 98  --   CO2 27  --   BUN 20  --   CREATININE 1.12  --   GLUCOSE 140*  --   CALCIUM 8.7  --      Discharge Medications:     Medication List    STOP taking these medications       allopurinol 300 MG tablet  Commonly known as:  ZYLOPRIM     meloxicam 15 MG tablet  Commonly known as:  MOBIC      TAKE these medications       acetaminophen 500 MG tablet  Commonly known as:  TYLENOL  Take 1,000 mg by mouth 2 (two) times daily as needed. For pain     aspirin 325 MG EC tablet  Take 1 tablet (325 mg total) by mouth 2 (two) times daily.     colchicine 0.6 MG tablet  Take 1 tablet (0.6 mg total) by mouth 2 (two) times daily.     CORICIDIN HBP COLD/FLU PO  Take 1 tablet by mouth every 4 (four) hours as needed. For cold symptoms  lisinopril 10 MG tablet  Commonly known as:  PRINIVIL,ZESTRIL  Take 10 mg by mouth daily.     methocarbamol 500 MG tablet  Commonly known as:  ROBAXIN  Take 1 tablet (500 mg total) by mouth every 6 (six) hours as needed.     Omega-3-6-9 Caps  Take 2 capsules by mouth daily.     oxyCODONE 5 MG immediate release tablet  Commonly known as:  Oxy IR/ROXICODONE  Take 1-2 tablets (5-10 mg total) by mouth every 3 (three) hours as needed.        Diagnostic Studies: Dg Chest 2 View  09/18/2012  *RADIOLOGY REPORT*  Clinical Data: Preop knee replacement  CHEST - 2 VIEW  Comparison:  Chest radiograph 11/18/2008  Findings: Normal mediastinum and heart silhouette.  Costophrenic angles are clear.  No effusion, infiltrate, or pneumothorax.  IMPRESSION: No acute cardiopulmonary process.   Original Report Authenticated By: Genevive Bi, M.D.     Disposition: Final discharge disposition not confirmed      Discharge Orders   Future Orders Complete By Expires     Call MD / Call 911  As directed     Comments:      If you  experience chest pain or shortness of breath, CALL 911 and be transported to the hospital emergency room.  If you develope a fever above 101 F, pus (white drainage) or increased drainage or redness at the wound, or calf pain, call your surgeon's office.    Constipation Prevention  As directed     Comments:      Drink plenty of fluids.  Prune juice may be helpful.  You may use a stool softener, such as Colace (over the counter) 100 mg twice a day.  Use MiraLax (over the counter) for constipation as needed.    Diet - low sodium heart healthy  As directed     Increase activity slowly as tolerated  As directed        Follow-up Information   Follow up with Velna Ochs, MD In 2 weeks.   Contact information:   [address] [place] [address] [phone]        Signed: Prince Rome 09/29/2012, 11:34 AM

## 2012-09-29 NOTE — Progress Notes (Signed)
Subjective: 3 Days Post-Op Procedure(s) (LRB): TOTAL KNEE ARTHROPLASTY (Left) Doing great Activity level:  wbat Diet tolerance:  Ok  Voiding:  ok Patient reports pain as 2 on 0-10 scale.    Objective: Vital signs in last 24 hours: Temp:  [98.3 F (36.8 C)-100.7 F (38.2 C)] 98.3 F (36.8 C) (02/14 0700) Pulse Rate:  [87-94] 87 (02/14 0700) Resp:  [18-20] 18 (02/14 0700) BP: (103-118)/(50-63) 103/50 mmHg (02/14 0700) SpO2:  [98 %-99 %] 98 % (02/14 0700)  Labs:  Recent Labs  09/27/12 0415 09/28/12 0610  HGB 12.0* 11.0*    Recent Labs  09/27/12 0415 09/28/12 0610  WBC 8.1 9.2  RBC 3.87* 3.59*  HCT 34.2* 31.8*  PLT 175 171    Recent Labs  09/27/12 0415  NA 132*  K 5.0  CL 98  CO2 27  BUN 20  CREATININE 1.12  GLUCOSE 140*  CALCIUM 8.7   No results found for this basename: LABPT, INR,  in the last 72 hours  Physical Exam:  Neurologically intact ABD soft Neurovascular intact Sensation intact distally Intact pulses distally Dorsiflexion/Plantar flexion intact Incision: dressing C/D/I No cellulitis present Compartment soft Some mild foot pain- hx of gout  Assessment/Plan:  3 Days Post-Op Procedure(s) (LRB): TOTAL KNEE ARTHROPLASTY (Left) Advance diet Up with therapy Discharge home with home health Will restart colchicine in case it is gout. This has worked in the past for him     Ivis Nicolson R 09/29/2012, 12:04 PM

## 2012-09-29 NOTE — Progress Notes (Signed)
Physical Therapy Treatment Patient Details Name: Ronald Bullock MRN: [id number] DOB: October 27, 1950 Today's Date: 09/29/2012 Time: 1000-1102 PT Time Calculation (min): 62 min  PT Assessment / Plan / Recommendation Comments on Treatment Session  Pt no longer needs KI. Demonstrating good quad strength in LLE.  All acute PT goals met.  Provided pt with HEP handouts.    Pt reporting pain in Bilateral feet worse than pain in knee. Pt presents with pain to palpation of plantar surface of Left forefoot and plantar surface of R heel.  Feet are warm to the touch and pt c/o pain with light touch to lateral side of left foot. Pt has a history of gout.  Pt has standing order for cholchicine.  Notified RN of potential gout pain.  Suggested to CNA to place pt in CPM this morning as he is likely to be discharged to home today.  CNA stated she will place pt in CPM after lunch.      Follow Up Recommendations  Home health PT     Does the patient have the potential to tolerate intense rehabilitation     Barriers to Discharge        Equipment Recommendations  Rolling walker with 5" wheels    Recommendations for Other Services    Frequency 7X/week   Plan All goals met and education completed, patient dischaged from PT services    Precautions / Restrictions Precautions Precautions: Fall Required Braces or Orthoses: Knee Immobilizer - Left Knee Immobilizer - Left: Discontinue once straight leg raise with < 10 degree lag Restrictions Weight Bearing Restrictions: Yes LLE Weight Bearing: Weight bearing as tolerated   Pertinent Vitals/Pain 3/10 pain in L knee. Pt reporting pain in Bilateral feet worse than pain in knee. Pt has a history of gout.  Pt has standing order for cholchicine.  Notified RN of potential gout pain.      Mobility  Bed Mobility Bed Mobility: Supine to Sit;Sit to Supine Supine to Sit: 6: Modified independent (Device/Increase time) Sit to Supine: 6: Modified independent  (Device/Increase time) Transfers Transfers: Sit to Stand;Stand to Sit Sit to Stand: 6: Modified independent (Device/Increase time) Stand to Sit: 6: Modified independent (Device/Increase time) Ambulation/Gait Ambulation/Gait Assistance: 6: Modified independent (Device/Increase time) Ambulation Distance (Feet): 150 Feet Assistive device: Rolling walker Ambulation/Gait Assistance Details: no assistance required.  Gait Pattern: Step-to pattern Stairs: Yes Stairs Assistance: 6: Modified independent (Device/Increase time);5: Supervision Stairs Assistance Details (indicate cue type and reason): instructed pt in forward and backward techniques Stair Management Technique: No rails;With walker Number of Stairs: 1 (2 trials) Wheelchair Mobility Wheelchair Mobility: No    Exercises Total Joint Exercises Ankle Circles/Pumps: 10 reps Quad Sets: 10 reps Short Arc Quad: AROM;10 reps;Supine;Left Heel Slides: Left;10 reps;Supine;AROM Hip ABduction/ADduction: 10 reps;AROM;Left Straight Leg Raises: AROM;10 reps;Supine;Left Knee Flexion: Left;5 reps;Seated Goniometric ROM: 0-90 degrees AAROM   PT Diagnosis:    PT Problem List:   PT Treatment Interventions:     PT Goals Acute Rehab PT Goals PT Goal Formulation: With patient Time For Goal Achievement: 10/04/12 Potential to Achieve Goals: Good Pt will go Supine/Side to Sit: Independently PT Goal: Supine/Side to Sit - Progress: Met Pt will go Sit to Supine/Side: Independently PT Goal: Sit to Supine/Side - Progress: Met Pt will Transfer Bed to Chair/Chair to Bed: Independently PT Transfer Goal: Bed to Chair/Chair to Bed - Progress: Met Pt will Ambulate: >150 feet;with modified independence;with rolling walker PT Goal: Ambulate - Progress: Met Pt will Go Up / Down Stairs:  1-2 stairs;with supervision;with least restrictive assistive device PT Goal: Up/Down Stairs - Progress: Met Pt will Perform Home Exercise Program: Independently PT Goal:  Perform Home Exercise Program - Progress: Met  Visit Information  Last PT Received On: 09/29/12    Subjective Data  Subjective: Knee feels a lot better today Patient Stated Goal: Play golf and bike.     Cognition  Cognition Overall Cognitive Status: Appears within functional limits for tasks assessed/performed Arousal/Alertness: Awake/alert Orientation Level: Appears intact for tasks assessed Behavior During Session: [facility] for tasks performed    Balance     End of Session PT - End of Session Equipment Utilized During Treatment: Gait belt;Left knee immobilizer Activity Tolerance: Patient tolerated treatment well Patient left: in chair;with call bell/phone within reach Nurse Communication: Mobility status CPM Left Knee CPM Left Knee: Off   GP     Legend Tumminello 09/29/2012, 11:22 AM Theron Arista L. Melayna Robarts DPT [phone]

## 2012-10-11 ENCOUNTER — Encounter: Payer: Self-pay | Admitting: Family Medicine

## 2012-11-06 ENCOUNTER — Other Ambulatory Visit: Payer: Self-pay | Admitting: Family Medicine

## 2013-01-23 ENCOUNTER — Encounter: Payer: Self-pay | Admitting: Family Medicine

## 2013-01-23 ENCOUNTER — Ambulatory Visit (INDEPENDENT_AMBULATORY_CARE_PROVIDER_SITE_OTHER): Payer: BC Managed Care – PPO | Admitting: Family Medicine

## 2013-01-23 VITALS — BP 120/78 | HR 75 | Temp 98.6°F | Ht 73.0 in | Wt 232.0 lb

## 2013-01-23 DIAGNOSIS — G5620 Lesion of ulnar nerve, unspecified upper limb: Secondary | ICD-10-CM

## 2013-01-23 DIAGNOSIS — M7020 Olecranon bursitis, unspecified elbow: Secondary | ICD-10-CM

## 2013-01-23 DIAGNOSIS — G5621 Lesion of ulnar nerve, right upper limb: Secondary | ICD-10-CM

## 2013-01-23 DIAGNOSIS — M7021 Olecranon bursitis, right elbow: Secondary | ICD-10-CM

## 2013-01-23 MED ORDER — CEPHALEXIN 500 MG PO CAPS: 1000.0000 mg | ORAL_CAPSULE | Freq: Two times a day (BID) | ORAL | Status: DC

## 2013-01-23 NOTE — Progress Notes (Signed)
Nature conservation officer at [facility] [address] [place] [address] Phone: [phone] Fax: [phone]  Date:  01/23/2013   Name:  Ronald Bullock   DOB:  February 09, 1951   MRN:  [id number] Gender: male Age: 62 y.o.  Primary Physician:  [name], MD  Evaluating MD: Hannah Beat, MD   Chief Complaint: fluid on elbow   History of Present Illness:  Ronald Bullock is a 62 y.o. pleasant patient who presents with the following:  62 yo with h/o DJD, s/p L TKR, h/o rare gout, h/o neuropathic symptoms on the right, more in an ulnar distribution previously felt to be ulnar neuropathy by myself. Drives for long periods at work.   Now has an enlarged fluid filled area on olecranon of the R elbow.  Patient Active Problem List   Diagnosis Date Noted  . Left knee DJD 09/26/2012    Class: Chronic  . Ulnar neuropathy at wrist 07/11/2012  . Ulnar neuropathy at elbow 07/11/2012  . Dizziness 02/28/2012  . Right foot pain 07/23/2011  . Healthcare maintenance 07/07/2011  . HLD (hyperlipidemia)   . Arthritis   . OSA (obstructive sleep apnea)   . OVERWEIGHT 09/30/2010  . PERIPHERAL EDEMA 05/12/2010  . Gout, unspecified 09/24/2009  . ANKLE PAIN, LEFT 09/03/2008  . DIZZINESS, CHRONIC 05/04/2007  . HYPERTENSION, ESSENTIAL 04/25/2007  . HYPERGLYCEMIA 04/16/2004  . ARTHROPATHY, TRANSIENT, MULTIPLE SITES 11/14/2001    Past Medical History  Diagnosis Date  . HLD (hyperlipidemia)     Low HDL  . Arthritis     L end stage knee DKD, mild R knee DJD, R hip end stage DJD  . OSA on CPAP   . Gout     rare flares  . Hyperglycemia   . Hypertension   . Osteoarthritis     R knee and hip, s/p L TKR    Past Surgical History  Procedure Laterality Date  . Knee arthroscopy  1968    cartilage removal  . Ct head limited w/cm  1997    WNL  . Cardiolyte  1997    WNL  . Carotid US  1997    WNL  . Esophagogastroduodenoscopy  1998    gastric polyps, benign  . Colonoscopy   1998    WNL  . Mri lumbar  2000    mild bulge L3/4, mild foraminal narrowing L4/5, L5/S2, small disk herniation  . Colonoscopy  06/2010    WNL, internal hemorrhoids, rec rpt 10 yrs  . Joint replacement  1968    Left Knee, cartilage removed  . Total knee arthroplasty Left 09/26/2012    Procedure: TOTAL KNEE ARTHROPLASTY;  Surgeon: Velna Ochs, MD;  Location: [facility] OR;  Service: Orthopedics;  Laterality: Left;    History   Social History  . Marital Status: Married    Spouse Name: N/A    Number of Children: 4  . Years of Education: N/A   Occupational History  . 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 Smoking status: Never Smoker   . Smokeless tobacco: Never Used  . Alcohol Use: No     Comment: Rare  . Drug Use: No  . Sexually Active: Not on file   Other Topics Concern  . Not on file   Social History Narrative   Lives with wife, has grown son   Activity: no regular exercise   Diet: good water, some fish, good vegetables, red meat  3x/wk    Family History  Problem Relation Age of Onset  . Stroke Father 59  . Diabetes Father   . Asthma Mother   . Cancer Mother     Vaginal; radiation dz of bowel  . Obesity Sister     No Known Allergies  Medication list has been reviewed and updated.  Outpatient Prescriptions Prior to Visit  Medication Sig Dispense Refill  . lisinopril (PRINIVIL,ZESTRIL) 10 MG tablet TAKE 1 TABLET (10 MG TOTAL) BY MOUTH DAILY.  90 tablet  3  . acetaminophen (TYLENOL) 500 MG tablet Take 1,000 mg by mouth 2 (two) times daily as needed. For pain      . aspirin EC 325 MG EC tablet Take 1 tablet (325 mg total) by mouth 2 (two) times daily.  30 tablet  1  . Chlorpheniramine-Acetaminophen (CORICIDIN HBP COLD/FLU PO) Take 1 tablet by mouth every 4 (four) hours as needed. For cold symptoms      . colchicine 0.6 MG tablet Take 1 tablet (0.6 mg total) by mouth 2 (two) times daily.  60 tablet  0  .  methocarbamol (ROBAXIN) 500 MG tablet Take 1 tablet (500 mg total) by mouth every 6 (six) hours as needed.  50 tablet  0  . Omega-3-6-9 CAPS Take 2 capsules by mouth daily.       Marland Kitchen oxyCODONE (OXY IR/ROXICODONE) 5 MG immediate release tablet Take 1-2 tablets (5-10 mg total) by mouth every 3 (three) hours as needed.  80 tablet  0   No facility-administered medications prior to visit.    Review of Systems:   GEN: No fevers, chills. Nontoxic. Primarily MSK c/o today. MSK: Detailed in the HPI GI: tolerating PO intake without difficulty Neuro: detailed above Otherwise the pertinent positives of the ROS are noted above.    Physical Examination: BP 120/78  Pulse 75  Temp(Src) 98.6 F (37 C) (Oral)  Ht 6\' 1"  (1.854 m)  Wt 232 lb (105.235 kg)  BMI 30.62 kg/m2  SpO2 98%  Ideal Body Weight: Weight in (lb) to have BMI = 25: 189.1   GEN: WDWN, NAD, Non-toxic, Alert & Oriented x 3 HEENT: Atraumatic, Normocephalic.  Ears and Nose: No external deformity. EXTR: No clubbing/cyanosis/edema NEURO: Normal gait.  PSYCH: Normally interactive. Conversant. Not depressed or anxious appearing.  Calm demeanor.   R elbow Ecchymosis or edema: neg ROM: full flexion, extension, pronation, supination Shoulder ROM: Full Flexion: 5/5 Extension: 5/5 Supination: 5/5  Pronation: 5/5 Wrist ext: 5/5 Wrist flexion: 5/5 No gross bony abnormality Varus and Valgus stress: stable ECRB tenderness: neg Medial epicondyle: NT Lateral epicondyle, resisted wrist extension from wrist full pronation and flexion: NT Notable olecranon bursitis grip: 5/5  sensation intact   Assessment and Plan:  Olecranon bursitis of right elbow  Ulnar neuropathy at elbow, right  >25 minutes spent in face to face time with patient, >50% spent in counselling or coordination of care: reviewed bursitis, olecranon bursitis, gouty bursitis, septic arthritis, and potential causes in detail. Reviewed my prior conversation at length  regarding neuropathy of the R UE in detail. Still with symptoms, but improved some. Discussed possibility of NCV/EMG, and potential surgical consult with elbow surgeon, but they declined.   Given increased risk relatively of olecranon bursal aspiration, place on keflex for 1 week.  Olecranon Bursa Aspiration, RIGHT Verbal consent was obtained. Risks, benefits, and alternatives discussed. Potential complications including loss of pigment and atrophy were discussed and risk of infection. Discussed with the patient that risk of infection  is higher with injection of corticosteroid into this superficial bursa. Prepped with Chloraprep and Ethyl Chloride used for anesthesia. Under sterile conditions, the effected olecranon bursa was aspirated with an 18 gauge needle. 7 cc amount of clear, yellow fluid was aspirated. No corticosteroid was injected.   A compression bandage was applied and the patient was instructed to continue for 72 hours.   Orders Today:  No orders of the defined types were placed in this encounter.    Updated Medication List: (Includes new medications, updates to list, dose adjustments) Meds ordered this encounter  Medications  . meloxicam (MOBIC) 15 MG tablet    Sig:   . cephALEXin (KEFLEX) 500 MG capsule    Sig: Take 2 capsules (1,000 mg total) by mouth 2 (two) times daily.    Dispense:  28 capsule    Refill:  0    Medications Discontinued: Medications Discontinued During This Encounter  Medication Reason  . methocarbamol (ROBAXIN) 500 MG tablet Error  . Omega-3-6-9 CAPS Error  . oxyCODONE (OXY IR/ROXICODONE) 5 MG immediate release tablet Error  . colchicine 0.6 MG tablet Error  . Chlorpheniramine-Acetaminophen (CORICIDIN HBP COLD/FLU PO) Error  . aspirin EC 325 MG EC tablet Error  . acetaminophen (TYLENOL) 500 MG tablet Error      Signed, Nelli Swalley T. Loomis Anacker, MD 01/23/2013 9:07 AM

## 2013-03-03 ENCOUNTER — Other Ambulatory Visit: Payer: Self-pay | Admitting: Family Medicine

## 2013-03-09 ENCOUNTER — Other Ambulatory Visit: Payer: Self-pay | Admitting: Orthopaedic Surgery

## 2013-03-13 ENCOUNTER — Encounter (HOSPITAL_COMMUNITY): Payer: Self-pay | Admitting: Pharmacy Technician

## 2013-03-17 NOTE — Pre-Procedure Instructions (Signed)
Ronald Bullock  03/17/2013   Your procedure is scheduled on:  August 12  Report to [facility] at 05:30 AM.  Call this number if you have problems the morning of surgery: [phone]   Remember:   Do not eat food or drink liquids after midnight.   Take these medicines the morning of surgery with A SIP OF WATER: Tylenol (if needed)   STOP Meloxicam, Omega 3   Do not wear jewelry, make-up or nail polish.  Do not wear lotions, powders, or perfumes. You may wear deodorant.  Do not shave 48 hours prior to surgery. Men may shave face and neck.  Do not bring valuables to the hospital.  [facility] is not responsible for any belongings or valuables.  Contacts, dentures or bridgework may not be worn into surgery.  Leave suitcase in the car. After surgery it may be brought to your room.  For patients admitted to the hospital, checkout time is 11:00 AM the day of discharge.   Special Instructions: Shower using CHG 2 nights before surgery and the night before surgery.  If you shower the day of surgery use CHG.  Use special wash - you have one bottle of CHG for all showers.  You should use approximately 1/3 of the bottle for each shower.   Please read over the following fact sheets that you were given: Pain Booklet, Coughing and Deep Breathing, Blood Transfusion Information, Lab Information, MRSA Information and Surgical Site Infection Prevention

## 2013-03-19 ENCOUNTER — Encounter (HOSPITAL_COMMUNITY)
Admission: RE | Admit: 2013-03-19 | Discharge: 2013-03-19 | Disposition: A | Discharge status: Home / Self Care | Payer: BC Managed Care – PPO | Source: Ambulatory Visit | Attending: Orthopaedic Surgery | Admitting: Orthopaedic Surgery

## 2013-03-19 ENCOUNTER — Encounter (HOSPITAL_COMMUNITY): Payer: Self-pay

## 2013-03-19 DIAGNOSIS — Z01818 Encounter for other preprocedural examination: Secondary | ICD-10-CM | POA: Insufficient documentation

## 2013-03-19 DIAGNOSIS — Z01812 Encounter for preprocedural laboratory examination: Secondary | ICD-10-CM | POA: Insufficient documentation

## 2013-03-19 LAB — CBC WITH DIFFERENTIAL/PLATELET
Eosinophils Absolute: 0.2 10*3/uL (ref 0.0–0.7)
Lymphocytes Relative: 30 % (ref 12–46)
Lymphs Abs: 1.5 10*3/uL (ref 0.7–4.0)
Neutro Abs: 2.6 10*3/uL (ref 1.7–7.7)
Neutrophils Relative %: 53 % (ref 43–77)
Platelets: 159 10*3/uL (ref 150–400)
RBC: 4.18 MIL/uL — ABNORMAL LOW (ref 4.22–5.81)
WBC: 5 10*3/uL (ref 4.0–10.5)

## 2013-03-19 LAB — BASIC METABOLIC PANEL
Calcium: 9.3 mg/dL (ref 8.4–10.5)
GFR calc Af Amer: 75 mL/min — ABNORMAL LOW (ref >90)
GFR calc non Af Amer: 65 mL/min — ABNORMAL LOW (ref >90)
Potassium: 4.6 mEq/L (ref 3.5–5.1)
Sodium: 139 mEq/L (ref 135–145)

## 2013-03-19 LAB — URINALYSIS, ROUTINE W REFLEX MICROSCOPIC
Bilirubin Urine: NEGATIVE
Ketones, ur: NEGATIVE mg/dL
Nitrite: NEGATIVE
pH: 5.5 (ref 5.0–8.0)

## 2013-03-19 LAB — APTT: aPTT: 26 seconds (ref 24–37)

## 2013-03-19 LAB — PROTIME-INR
INR: 1.01 (ref 0.00–1.49)
Prothrombin Time: 13.1 seconds (ref 11.6–15.2)

## 2013-03-19 LAB — TYPE AND SCREEN: ABO/RH(D): B POS

## 2013-03-19 LAB — SURGICAL PCR SCREEN: MRSA, PCR: NEGATIVE

## 2013-03-19 NOTE — Progress Notes (Signed)
Patient stated he would bring his mask and CPAP day of surgery because hospital did not have a CPAP that fit his mask.

## 2013-03-25 NOTE — H&P (Signed)
TOTAL HIP ADMISSION H&P  Patient is admitted for right total hip arthroplasty.  Subjective:  Chief Complaint: right hip pain  HPI: Ronald Bullock, 62 y.o. male, has a history of pain and functional disability in the right hip(s) due to arthritis and patient has failed non-surgical conservative treatments for greater than 12 weeks to include NSAID's and/or analgesics, flexibility and strengthening excercises, supervised PT with diminished ADL's post treatment, use of assistive devices, weight reduction as appropriate and activity modification.  Onset of symptoms was gradual starting 4 years ago with gradually worsening course since that time.The patient noted no past surgery on the right hip(s).  Patient currently rates pain in the right hip at 8 out of 10 with activity. Patient has night pain, worsening of pain with activity and weight bearing, trendelenberg gait, pain that interfers with activities of daily living and pain with passive range of motion. Patient has evidence of subchondral sclerosis, periarticular osteophytes and joint space narrowing by imaging studies. This condition presents safety issues increasing the risk of falls. This patient has had no previous surgery.  There is no current active infection.  Patient Active Problem List   Diagnosis Date Noted  . Left knee DJD 09/26/2012    Priority: High    Class: Chronic  . Ulnar neuropathy at wrist 07/11/2012  . Ulnar neuropathy at elbow 07/11/2012  . Dizziness 02/28/2012  . Right foot pain 07/23/2011  . Healthcare maintenance 07/07/2011  . HLD (hyperlipidemia)   . Arthritis   . OSA (obstructive sleep apnea)   . OVERWEIGHT 09/30/2010  . PERIPHERAL EDEMA 05/12/2010  . Gout, unspecified 09/24/2009  . ANKLE PAIN, LEFT 09/03/2008  . DIZZINESS, CHRONIC 05/04/2007  . HYPERTENSION, ESSENTIAL 04/25/2007  . HYPERGLYCEMIA 04/16/2004  . ARTHROPATHY, TRANSIENT, MULTIPLE SITES 11/14/2001   Past Medical History  Diagnosis Date  .  HLD (hyperlipidemia)     Low HDL  . Arthritis     L end stage knee DKD, mild R knee DJD, R hip end stage DJD  . OSA on CPAP   . Gout     rare flares  . Hyperglycemia   . Hypertension   . Osteoarthritis     R knee and hip, s/p L TKR    Past Surgical History  Procedure Laterality Date  . Knee arthroscopy  1968    cartilage removal  . Ct head limited w/cm  1997    WNL  . Cardiolyte  1997    WNL  . Carotid US  1997    WNL  . Esophagogastroduodenoscopy  1998    gastric polyps, benign  . Colonoscopy  1998    WNL  . Mri lumbar  2000    mild bulge L3/4, mild foraminal narrowing L4/5, L5/S2, small disk herniation  . Colonoscopy  06/2010    WNL, internal hemorrhoids, rec rpt 10 yrs  . Joint replacement  1968    Left Knee, cartilage removed  . Total knee arthroplasty Left 09/26/2012    Procedure: TOTAL KNEE ARTHROPLASTY;  Surgeon: Velna Ochs, MD;  Location: [facility] OR;  Service: Orthopedics;  Laterality: Left;    No prescriptions prior to admission   No Known Allergies  History  Substance Use Topics  . Smoking status: Never Smoker   . Smokeless tobacco: Never Used  . Alcohol Use: No     Comment: Rare    Family History  Problem Relation Age of Onset  . Stroke Father 62  . Diabetes Father   . Asthma Mother   .  Cancer Mother     Vaginal; radiation dz of bowel  . Obesity Sister      Review of Systems  Constitutional: Negative.   HENT: Negative.   Eyes: Negative.   Respiratory: Negative.   Cardiovascular: Negative.   Gastrointestinal: Negative.   Genitourinary: Negative.   Musculoskeletal: Positive for joint pain.  Skin: Negative.   Neurological: Negative.   Endo/Heme/Allergies: Negative.   Psychiatric/Behavioral: Negative.     Objective:  Physical Exam  Constitutional: He is oriented to person, place, and time. He appears well-nourished.  HENT:  Head: Atraumatic.  Eyes: Conjunctivae are normal.  Neck: Neck supple.  Cardiovascular: Regular rhythm.    Respiratory: Breath sounds normal.  GI: Soft.  Musculoskeletal:  Right hip motion is very limited with rotation and forward flexion.  He walks with a antalgic gait.  Good neurovascular status distally to his toes.  Neurological: He is oriented to person, place, and time.  Skin: Skin is dry.  Psychiatric: He has a normal mood and affect.    Vital signs in last 24 hours:    Labs:   Estimated body mass index is 30.62 kg/(m^2) as calculated from the following:   Height as of 01/23/13: 6\' 1"  (1.854 m).   Weight as of 01/23/13: 105.235 kg (232 lb).   Imaging Review Plain radiographs demonstrate severe degenerative joint disease of the right hip(s). The bone quality appears to be good for age and reported activity level.  Assessment/Plan:  End stage arthritis, right hip(s)  The patient history, physical examination, clinical judgement of the provider and imaging studies are consistent with end stage degenerative joint disease of the right hip(s) and total hip arthroplasty is deemed medically necessary. The treatment options including medical management, injection therapy, arthroscopy and arthroplasty were discussed at length. The risks and benefits of total hip arthroplasty were presented and reviewed. The risks due to aseptic loosening, infection, stiffness, dislocation/subluxation,  thromboembolic complications and other imponderables were discussed.  The patient acknowledged the explanation, agreed to proceed with the plan and consent was signed. Patient is being admitted for inpatient treatment for surgery, pain control, PT, OT, prophylactic antibiotics, VTE prophylaxis, progressive ambulation and ADL's and discharge planning.The patient is planning to be discharged home with home health services

## 2013-03-26 MED ORDER — CHLORHEXIDINE GLUCONATE 4 % EX LIQD: 60.0000 mL | Freq: Once | CUTANEOUS | Status: DC

## 2013-03-26 MED ORDER — CEFAZOLIN SODIUM-DEXTROSE 2-3 GM-% IV SOLR
2.0000 g | INTRAVENOUS | Status: AC
  Administered 2013-03-27: 2 g via INTRAVENOUS
  Filled 2013-03-26: qty 50

## 2013-03-27 ENCOUNTER — Encounter (HOSPITAL_COMMUNITY): Payer: Self-pay | Admitting: Certified Registered"

## 2013-03-27 ENCOUNTER — Encounter (HOSPITAL_COMMUNITY)
Admission: RE | Disposition: A | Discharge status: Home Health | Payer: Self-pay | Source: Ambulatory Visit | Attending: Orthopaedic Surgery

## 2013-03-27 ENCOUNTER — Ambulatory Visit (HOSPITAL_COMMUNITY): Payer: BC Managed Care – PPO

## 2013-03-27 ENCOUNTER — Encounter (HOSPITAL_COMMUNITY): Payer: Self-pay | Admitting: *Deleted

## 2013-03-27 ENCOUNTER — Ambulatory Visit (HOSPITAL_COMMUNITY): Payer: BC Managed Care – PPO | Admitting: Certified Registered"

## 2013-03-27 ENCOUNTER — Inpatient Hospital Stay (HOSPITAL_COMMUNITY)
Admission: RE | Admit: 2013-03-27 | Discharge: 2013-03-29 | DRG: 818 | Disposition: A | Discharge status: Home Health | Payer: BC Managed Care – PPO | Source: Ambulatory Visit | Attending: Orthopaedic Surgery | Admitting: Orthopaedic Surgery

## 2013-03-27 DIAGNOSIS — G5620 Lesion of ulnar nerve, unspecified upper limb: Secondary | ICD-10-CM | POA: Diagnosis present

## 2013-03-27 DIAGNOSIS — Z7982 Long term (current) use of aspirin: Secondary | ICD-10-CM

## 2013-03-27 DIAGNOSIS — M1610 Unilateral primary osteoarthritis, unspecified hip: Principal | ICD-10-CM | POA: Diagnosis present

## 2013-03-27 DIAGNOSIS — Z833 Family history of diabetes mellitus: Secondary | ICD-10-CM

## 2013-03-27 DIAGNOSIS — E785 Hyperlipidemia, unspecified: Secondary | ICD-10-CM | POA: Diagnosis present

## 2013-03-27 DIAGNOSIS — Z8049 Family history of malignant neoplasm of other genital organs: Secondary | ICD-10-CM

## 2013-03-27 DIAGNOSIS — M169 Osteoarthritis of hip, unspecified: Secondary | ICD-10-CM | POA: Diagnosis present

## 2013-03-27 DIAGNOSIS — M1710 Unilateral primary osteoarthritis, unspecified knee: Secondary | ICD-10-CM | POA: Diagnosis present

## 2013-03-27 DIAGNOSIS — G4733 Obstructive sleep apnea (adult) (pediatric): Secondary | ICD-10-CM | POA: Diagnosis present

## 2013-03-27 DIAGNOSIS — M109 Gout, unspecified: Secondary | ICD-10-CM | POA: Diagnosis present

## 2013-03-27 DIAGNOSIS — Z79899 Other long term (current) drug therapy: Secondary | ICD-10-CM

## 2013-03-27 DIAGNOSIS — Z825 Family history of asthma and other chronic lower respiratory diseases: Secondary | ICD-10-CM

## 2013-03-27 DIAGNOSIS — Z96659 Presence of unspecified artificial knee joint: Secondary | ICD-10-CM

## 2013-03-27 DIAGNOSIS — Z823 Family history of stroke: Secondary | ICD-10-CM

## 2013-03-27 DIAGNOSIS — I10 Essential (primary) hypertension: Secondary | ICD-10-CM | POA: Diagnosis present

## 2013-03-27 HISTORY — PX: TOTAL HIP ARTHROPLASTY: SHX124

## 2013-03-27 SURGERY — ARTHROPLASTY, HIP, TOTAL, ANTERIOR APPROACH
Anesthesia: General | Site: Hip | Laterality: Right | Wound class: Clean

## 2013-03-27 MED ORDER — LIDOCAINE HCL 4 % MT SOLN
OROMUCOSAL | Status: DC | PRN
  Administered 2013-03-27: 4 mL via TOPICAL

## 2013-03-27 MED ORDER — HYDROMORPHONE HCL PF 1 MG/ML IJ SOLN: 0.5000 mg | INTRAMUSCULAR | Status: DC | PRN

## 2013-03-27 MED ORDER — METHOCARBAMOL 500 MG PO TABS
500.0000 mg | ORAL_TABLET | Freq: Four times a day (QID) | ORAL | Status: DC | PRN
  Administered 2013-03-27 – 2013-03-29 (×5): 500 mg via ORAL
  Filled 2013-03-27 (×5): qty 1

## 2013-03-27 MED ORDER — MIDAZOLAM HCL 5 MG/5ML IJ SOLN
INTRAMUSCULAR | Status: DC | PRN
  Administered 2013-03-27: 2 mg via INTRAVENOUS

## 2013-03-27 MED ORDER — NEOSTIGMINE METHYLSULFATE 1 MG/ML IJ SOLN
INTRAMUSCULAR | Status: DC | PRN
  Administered 2013-03-27: 4 mg via INTRAVENOUS

## 2013-03-27 MED ORDER — ACETAMINOPHEN 325 MG PO TABS
650.0000 mg | ORAL_TABLET | Freq: Four times a day (QID) | ORAL | Status: DC | PRN
  Administered 2013-03-27 – 2013-03-28 (×3): 650 mg via ORAL
  Filled 2013-03-27 (×3): qty 2

## 2013-03-27 MED ORDER — ROCURONIUM BROMIDE 100 MG/10ML IV SOLN
INTRAVENOUS | Status: DC | PRN
  Administered 2013-03-27: 50 mg via INTRAVENOUS

## 2013-03-27 MED ORDER — ZOLPIDEM TARTRATE 5 MG PO TABS: 5.0000 mg | ORAL_TABLET | Freq: Every evening | ORAL | Status: DC | PRN

## 2013-03-27 MED ORDER — PROPOFOL 10 MG/ML IV BOLUS
INTRAVENOUS | Status: DC | PRN
  Administered 2013-03-27: 170 mg via INTRAVENOUS

## 2013-03-27 MED ORDER — ALUM & MAG HYDROXIDE-SIMETH 200-200-20 MG/5ML PO SUSP: 30.0000 mL | ORAL | Status: DC | PRN

## 2013-03-27 MED ORDER — FERROUS SULFATE 325 (65 FE) MG PO TABS
325.0000 mg | ORAL_TABLET | Freq: Every day | ORAL | Status: DC
  Administered 2013-03-28 – 2013-03-29 (×2): 325 mg via ORAL
  Filled 2013-03-27 (×3): qty 1

## 2013-03-27 MED ORDER — ARTIFICIAL TEARS OP OINT
TOPICAL_OINTMENT | OPHTHALMIC | Status: DC | PRN
  Administered 2013-03-27: 1 via OPHTHALMIC

## 2013-03-27 MED ORDER — BISACODYL 10 MG RE SUPP: 10.0000 mg | Freq: Every day | RECTAL | Status: DC | PRN

## 2013-03-27 MED ORDER — KETOROLAC TROMETHAMINE 30 MG/ML IJ SOLN: 15.0000 mg | Freq: Once | INTRAMUSCULAR | Status: DC | PRN

## 2013-03-27 MED ORDER — ACETAMINOPHEN 650 MG RE SUPP: 650.0000 mg | Freq: Four times a day (QID) | RECTAL | Status: DC | PRN

## 2013-03-27 MED ORDER — LACTATED RINGERS IV SOLN
INTRAVENOUS | Status: DC | PRN
  Administered 2013-03-27 (×2): via INTRAVENOUS

## 2013-03-27 MED ORDER — LACTATED RINGERS IV SOLN
INTRAVENOUS | Status: DC
  Administered 2013-03-27: 16:00:00 via INTRAVENOUS

## 2013-03-27 MED ORDER — PHENYLEPHRINE HCL 10 MG/ML IJ SOLN
INTRAMUSCULAR | Status: DC | PRN
  Administered 2013-03-27 (×2): 80 ug via INTRAVENOUS
  Administered 2013-03-27: 120 ug via INTRAVENOUS

## 2013-03-27 MED ORDER — HYDROCODONE-ACETAMINOPHEN 7.5-325 MG PO TABS
1.0000 | ORAL_TABLET | ORAL | Status: DC | PRN
  Administered 2013-03-27: 2 via ORAL
  Filled 2013-03-27: qty 2

## 2013-03-27 MED ORDER — CEFAZOLIN SODIUM-DEXTROSE 2-3 GM-% IV SOLR
2.0000 g | Freq: Four times a day (QID) | INTRAVENOUS | Status: AC
  Administered 2013-03-27 (×2): 2 g via INTRAVENOUS
  Filled 2013-03-27 (×2): qty 50

## 2013-03-27 MED ORDER — ONDANSETRON HCL 4 MG/2ML IJ SOLN: 4.0000 mg | Freq: Once | INTRAMUSCULAR | Status: DC | PRN

## 2013-03-27 MED ORDER — MORPHINE SULFATE 10 MG/ML IJ SOLN
INTRAMUSCULAR | Status: DC | PRN
  Administered 2013-03-27: 10 mg via INTRAVENOUS

## 2013-03-27 MED ORDER — ONDANSETRON HCL 4 MG/2ML IJ SOLN
INTRAMUSCULAR | Status: DC | PRN
  Administered 2013-03-27: 4 mg via INTRAVENOUS

## 2013-03-27 MED ORDER — LISINOPRIL 10 MG PO TABS
10.0000 mg | ORAL_TABLET | Freq: Every day | ORAL | Status: DC
  Administered 2013-03-27 – 2013-03-29 (×2): 10 mg via ORAL
  Filled 2013-03-27 (×3): qty 1

## 2013-03-27 MED ORDER — FENTANYL CITRATE 0.05 MG/ML IJ SOLN
INTRAMUSCULAR | Status: DC | PRN
  Administered 2013-03-27 (×3): 50 ug via INTRAVENOUS
  Administered 2013-03-27 (×2): 100 ug via INTRAVENOUS

## 2013-03-27 MED ORDER — METOCLOPRAMIDE HCL 5 MG/ML IJ SOLN: 5.0000 mg | Freq: Three times a day (TID) | INTRAMUSCULAR | Status: DC | PRN

## 2013-03-27 MED ORDER — MENTHOL 3 MG MT LOZG: 1.0000 | LOZENGE | OROMUCOSAL | Status: DC | PRN

## 2013-03-27 MED ORDER — ASPIRIN EC 325 MG PO TBEC
325.0000 mg | DELAYED_RELEASE_TABLET | Freq: Two times a day (BID) | ORAL | Status: DC
  Administered 2013-03-28 – 2013-03-29 (×3): 325 mg via ORAL
  Filled 2013-03-27 (×5): qty 1

## 2013-03-27 MED ORDER — PHENOL 1.4 % MT LIQD: 1.0000 | OROMUCOSAL | Status: DC | PRN

## 2013-03-27 MED ORDER — HYDROMORPHONE HCL PF 1 MG/ML IJ SOLN: 0.2500 mg | INTRAMUSCULAR | Status: DC | PRN

## 2013-03-27 MED ORDER — ONDANSETRON HCL 4 MG PO TABS: 4.0000 mg | ORAL_TABLET | Freq: Four times a day (QID) | ORAL | Status: DC | PRN

## 2013-03-27 MED ORDER — 0.9 % SODIUM CHLORIDE (POUR BTL) OPTIME
TOPICAL | Status: DC | PRN
  Administered 2013-03-27: 1000 mL

## 2013-03-27 MED ORDER — EPHEDRINE SULFATE 50 MG/ML IJ SOLN
INTRAMUSCULAR | Status: DC | PRN
  Administered 2013-03-27 (×2): 15 mg via INTRAVENOUS
  Administered 2013-03-27: 10 mg via INTRAVENOUS

## 2013-03-27 MED ORDER — ONDANSETRON HCL 4 MG/2ML IJ SOLN: 4.0000 mg | Freq: Four times a day (QID) | INTRAMUSCULAR | Status: DC | PRN

## 2013-03-27 MED ORDER — LIDOCAINE HCL (CARDIAC) 20 MG/ML IV SOLN
INTRAVENOUS | Status: DC | PRN
  Administered 2013-03-27: 40 mg via INTRAVENOUS

## 2013-03-27 MED ORDER — METOCLOPRAMIDE HCL 10 MG PO TABS: 5.0000 mg | ORAL_TABLET | Freq: Three times a day (TID) | ORAL | Status: DC | PRN

## 2013-03-27 MED ORDER — METHOCARBAMOL 100 MG/ML IJ SOLN
500.0000 mg | Freq: Four times a day (QID) | INTRAVENOUS | Status: DC | PRN
  Filled 2013-03-27: qty 5

## 2013-03-27 MED ORDER — DOCUSATE SODIUM 100 MG PO CAPS
100.0000 mg | ORAL_CAPSULE | Freq: Two times a day (BID) | ORAL | Status: DC
  Administered 2013-03-27 – 2013-03-29 (×5): 100 mg via ORAL
  Filled 2013-03-27 (×5): qty 1

## 2013-03-27 MED ORDER — GLYCOPYRROLATE 0.2 MG/ML IJ SOLN
INTRAMUSCULAR | Status: DC | PRN
  Administered 2013-03-27: 0.6 mg via INTRAVENOUS

## 2013-03-27 SURGICAL SUPPLY — 53 items
BLADE SAW SGTL 18X1.27X75 (BLADE) ×2 IMPLANT
BLADE SURG ROTATE 9660 (MISCELLANEOUS) ×1 IMPLANT
CAPT HIP PF COP ×1 IMPLANT
CELLS DAT CNTRL 66122 CELL SVR (MISCELLANEOUS) ×1 IMPLANT
CLOTH BEACON ORANGE TIMEOUT ST (SAFETY) ×2 IMPLANT
COVER BACK TABLE 24X17X13 BIG (DRAPES) IMPLANT
COVER SURGICAL LIGHT HANDLE (MISCELLANEOUS) ×2 IMPLANT
DRAPE C-ARM 42X72 X-RAY (DRAPES) ×2 IMPLANT
DRAPE STERI IOBAN 125X83 (DRAPES) ×2 IMPLANT
DRAPE U-SHAPE 47X51 STRL (DRAPES) ×6 IMPLANT
DRESSING AQUACEL AQ EXTRA 4X5 (GAUZE/BANDAGES/DRESSINGS) ×2 IMPLANT
DRSG AQUACEL AG ADV 3.5X10 (GAUZE/BANDAGES/DRESSINGS) ×2 IMPLANT
DURAPREP 26ML APPLICATOR (WOUND CARE) ×2 IMPLANT
ELECT BLADE 4.0 EZ CLEAN MEGAD (MISCELLANEOUS) ×2
ELECT BLADE TIP CTD 4 INCH (ELECTRODE) ×1 IMPLANT
ELECT CAUTERY BLADE 6.4 (BLADE) ×2 IMPLANT
ELECT REM PT RETURN 9FT ADLT (ELECTROSURGICAL) ×2
ELECTRODE BLDE 4.0 EZ CLN MEGD (MISCELLANEOUS) IMPLANT
ELECTRODE REM PT RTRN 9FT ADLT (ELECTROSURGICAL) ×1 IMPLANT
FACESHIELD LNG OPTICON STERILE (SAFETY) ×5 IMPLANT
GAUZE XEROFORM 1X8 LF (GAUZE/BANDAGES/DRESSINGS) ×1 IMPLANT
GLOVE BIO SURGEON STRL SZ8 (GLOVE) ×3 IMPLANT
GLOVE BIO SURGEON STRL SZ8.5 (GLOVE) ×2 IMPLANT
GLOVE BIOGEL PI IND STRL 8 (GLOVE) ×1 IMPLANT
GLOVE BIOGEL PI IND STRL 8.5 (GLOVE) ×1 IMPLANT
GLOVE BIOGEL PI INDICATOR 8 (GLOVE) ×2
GLOVE BIOGEL PI INDICATOR 8.5 (GLOVE) ×1
GOWN PREVENTION PLUS LG XLONG (DISPOSABLE) ×1 IMPLANT
GOWN STRL NON-REIN LRG LVL3 (GOWN DISPOSABLE) ×2 IMPLANT
GOWN STRL REIN XL XLG (GOWN DISPOSABLE) ×3 IMPLANT
KIT BASIN OR (CUSTOM PROCEDURE TRAY) ×2 IMPLANT
KIT ROOM TURNOVER OR (KITS) ×2 IMPLANT
MANIFOLD NEPTUNE II (INSTRUMENTS) ×2 IMPLANT
NS IRRIG 1000ML POUR BTL (IV SOLUTION) ×2 IMPLANT
PACK TOTAL JOINT (CUSTOM PROCEDURE TRAY) ×2 IMPLANT
PAD ARMBOARD 7.5X6 YLW CONV (MISCELLANEOUS) ×4 IMPLANT
RETRACTOR WND ALEXIS 18 MED (MISCELLANEOUS) ×1 IMPLANT
RTRCTR WOUND ALEXIS 18CM MED (MISCELLANEOUS) ×2
SPONGE LAP 18X18 X RAY DECT (DISPOSABLE) ×2 IMPLANT
SPONGE LAP 4X18 X RAY DECT (DISPOSABLE) ×1 IMPLANT
STAPLER VISISTAT 35W (STAPLE) ×2 IMPLANT
SUT ETHIBOND NAB CT1 #1 30IN (SUTURE) ×6 IMPLANT
SUT VIC AB 0 CT1 27 (SUTURE)
SUT VIC AB 0 CT1 27XBRD ANBCTR (SUTURE) IMPLANT
SUT VIC AB 1 CT1 27 (SUTURE) ×2
SUT VIC AB 1 CT1 27XBRD ANBCTR (SUTURE) ×1 IMPLANT
SUT VIC AB 2-0 CT1 27 (SUTURE) ×2
SUT VIC AB 2-0 CT1 TAPERPNT 27 (SUTURE) ×1 IMPLANT
SUT VLOC 180 0 24IN GS25 (SUTURE) ×2 IMPLANT
TOWEL OR 17X24 6PK STRL BLUE (TOWEL DISPOSABLE) ×2 IMPLANT
TOWEL OR 17X26 10 PK STRL BLUE (TOWEL DISPOSABLE) ×4 IMPLANT
TRAY FOLEY CATH 16FRSI W/METER (SET/KITS/TRAYS/PACK) ×1 IMPLANT
WATER STERILE IRR 1000ML POUR (IV SOLUTION) ×4 IMPLANT

## 2013-03-27 NOTE — Anesthesia Postprocedure Evaluation (Signed)
  Anesthesia Post-op Note  Patient: Ronald Bullock  Procedure(s) Performed: Procedure(s): TOTAL HIP ARTHROPLASTY ANTERIOR APPROACH (Right)  Patient Location: PACU  Anesthesia Type:General  Level of Consciousness: awake, alert  and oriented  Airway and Oxygen Therapy: Patient Spontanous Breathing and Patient connected to nasal cannula oxygen  Post-op Pain: mild  Post-op Assessment: Post-op Vital signs reviewed, Patient's Cardiovascular Status Stable, Respiratory Function Stable, Patent Airway and Pain level controlled  Post-op Vital Signs: stable  Complications: No apparent anesthesia complications

## 2013-03-27 NOTE — Anesthesia Preprocedure Evaluation (Signed)
Anesthesia Evaluation  Patient identified by MRN, date of birth, ID band Patient awake    Reviewed: Allergy & Precautions, H&P , NPO status , Patient's Chart, lab work & pertinent test results  Airway Mallampati: II TM Distance: >3 FB Neck ROM: Full    Dental  (+) Teeth Intact and Dental Advisory Given   Pulmonary  breath sounds clear to auscultation        Cardiovascular Rhythm:Regular Rate:Normal     Neuro/Psych    GI/Hepatic   Endo/Other    Renal/GU      Musculoskeletal   Abdominal   Peds  Hematology   Anesthesia Other Findings   Reproductive/Obstetrics                           Anesthesia Physical Anesthesia Plan  ASA: III  Anesthesia Plan: General   Post-op Pain Management:    Induction: Intravenous  Airway Management Planned: Oral ETT  Additional Equipment:   Intra-op Plan:   Post-operative Plan: Extubation in OR  Informed Consent: I have reviewed the patients History and Physical, chart, labs and discussed the procedure including the risks, benefits and alternatives for the proposed anesthesia with the patient or authorized representative who has indicated his/her understanding and acceptance.   Dental advisory given  Plan Discussed with: CRNA and Anesthesiologist  Anesthesia Plan Comments: (hTN DJD R. Hip Sleep Apnea on CPAP  Plan GA with oral ETT  Kipp Brood, MD)        Anesthesia Quick Evaluation

## 2013-03-27 NOTE — Op Note (Signed)
PRE-OP DIAGNOSIS:  RIGHT HIP DEGENERATIVE JOINT DISEASE POST-OP DIAGNOSIS:  RIGHT HIP DEGENERATIVE JOINT DISEASE PROCEDURE:  Procedure(s): RIGHT TOTAL HIP ARTHROPLASTY ANTERIOR APPROACH ANESTHESIA:  General SURGEON:  Marcene Corning MD ASSISTANT:  Lindwood Qua PA-C and Patrick Jupiter RNFA   INDICATIONS FOR PROCEDURE:  The patient is a 62 y.o. male with a long history of a painful hip.  This has persisted despite multiple conservative measures.  The patient has persisted with pain and dysfunction making rest and activity difficult.  A total hip replacement is offered as surgical treatment.  Informed operative consent was obtained after discussion of possible complications including reaction to anesthesia, infection, neurovascular injury, dislocation, DVT, PE, and death.  The importance of the postoperative rehab program to optimize result was stressed with the patient.  SUMMARY OF FINDINGS AND PROCEDURE:  Under general anesthesia through a anterior approach an the Hana table a right THR was performed.  The patient had severe degenerative change and excellent bone quality.  We used DePuy components to replace the hip and these were size KA12 Corail femur capped with a +5 38mm ceramic hip ball.  On the acetabular side we used a size 52 Gription shell with a  plus 4 neutral polyethylene liner.  We did use a hole eliminator.  Bryna Colander assisted throughout and was invaluable to the completion of the case in that he helped position and retract while I performed the procedure.  He also closed simultaneously to help minimize OR time.  I used fluoroscopy throughout the case to check position of implants and leg lengths and read all of these views myself.  DESCRIPTION OF PROCEDURE:  The patient was taken to the OR suite where general anesthetic was applied.  The patient was then positioned on the Hana table supine.  All bony prominences were appropriately padded.  Prep and drape was then performed in normal  sterile fashion.  The patient was given kefzol preoperative antibiotic and an appropriate time out was performed.  We then took an anterior approach to the right hip.  Dissection was taken through adipose to the tensor fascia lata fascia.  This structure was incised longitudinally and we dissected in the intermuscular interval just medial to this muscle.  Cobra retractors were placed superior and inferior to the femoral neck superficial to the capsule.  A capsular incision was then made and the retractors were placed along the femoral neck.  Xray was brought in to get a good level for the femoral neck cut which was made with an oscillating saw and osteotome.  The femoral head was removed with a corkscrew.  The acetabulum was exposed and some labral tissues were excised. Reaming was taken to the inside wall of the pelvis and sequentially up to 1 mm smaller than the actual component.  A trial of components was done and then the aforementioned acetabular shell was placed in appropriate tilt and anteversion confirmed by fluoroscopy. The liner was placed along with the hole eliminator and attention was turned to the femur.  The leg was brought down and over into adduction and the elevator bar was used to raise the femur up gently in the wound.  The piriformis was released with care taken to preserve the obturator internus attachment and all of the posterior capsule. The femur was reamed and then broached to the appropriate size.  A trial reduction was done and the aforementioned head and neck assembly gave [place] the best stability in extension with external rotation.  Leg lengths were  felt to be about equal by fluoroscopic exam.  The trial components were removed and the wound irrigated.  We then placed the femoral component in appropriate anteversion.  The head was applied to a dry stem neck and the hip again reduced.  It was again stable in the aforementioned position.  The would was irrigated again followed by  re-approximation of anterior capsule with ethibond suture. Tensor fascia was repaired with V-loc suture  followed by subcutaneous closure with #O and #2 undyed vicryl.  Skin was closed with staples followed by a sterile dressing.  EBL and IOF can be obtained from anesthesia records.  DISPOSITION:  The patient was extubated in the OR and taken to PACU in stable condition to be admitted to the Orthopedic Surgery for appropriate post-op care to include perioperative antibiotics and DVT prophylaxis.

## 2013-03-27 NOTE — Interval H&P Note (Signed)
History and Physical Interval Note:  03/27/2013 7:27 AM  Ronald Bullock  has presented today for surgery, with the diagnosis of RIGHT HIP DEGENERATIVE JOINT DISEASE  The various methods of treatment have been discussed with the patient and family. After consideration of risks, benefits and other options for treatment, the patient has consented to  Procedure(s): TOTAL HIP ARTHROPLASTY ANTERIOR APPROACH (Right) as a surgical intervention .  The patient's history has been reviewed, patient examined, no change in status, stable for surgery.  I have reviewed the patient's chart and labs.  Questions were answered to the patient's satisfaction.     [name] G

## 2013-03-27 NOTE — Evaluation (Addendum)
Occupational Therapy Evaluation Patient Details Name: Ronald Bullock MRN: [id number] DOB: 10/17/1950 Today's Date: 03/27/2013 Time: 4098-1191 OT Time Calculation (min): 26 min  OT Assessment / Plan / Recommendation History of present illness s/p right anterior THA   Clinical Impression   Pt is s/p anterior THA. Pt moving well and very motivated to work with therapy. OT educated pt and wife and had pt practice. Feel pt is safe to d/c home with wife available to assist 24/7. No more acute OT needs at this time.      OT Assessment  Patient does not need any further OT services    Follow Up Recommendations  No OT follow up;Supervision/Assistance - 24 hour    Barriers to Discharge      Equipment Recommendations  None recommended by OT    Recommendations for Other Services    Frequency       Precautions / Restrictions Precautions Precautions: None Restrictions Weight Bearing Restrictions: Yes RLE Weight Bearing: Weight bearing as tolerated   Pertinent Vitals/Pain Soreness in Rt hip. Increased activity.      ADL  Eating/Feeding: Independent Where Assessed - Eating/Feeding: Chair Grooming: Set up Where Assessed - Grooming: Supported sitting Upper Body Bathing: Set up Where Assessed - Upper Body Bathing: Supported sitting Lower Body Bathing: Min guard Where Assessed - Lower Body Bathing: Supported sit to stand Upper Body Dressing: Set up Where Assessed - Upper Body Dressing: Supported sitting Lower Body Dressing: Minimal assistance Where Assessed - Lower Body Dressing: Supported sit to Pharmacist, hospital: Hydrographic surveyor Method: Sit to Barista: Set designer - Architect and Hygiene: Min guard Where Assessed - Engineer, mining and Hygiene: Standing Tub/Shower Transfer: Landscape architect Method: Science writer: Walk in shower;Other (comment) (3  in 1) Equipment Used: Gait belt;Rolling walker Transfers/Ambulation Related to ADLs: Minguard ADL Comments: Pt practiced simulated shower transfer at Minguard level- cues for technique. Pt educated to stand in front of bed/chair with walker in front and wife with him when pulling up pants/underwear. Pt able to don/doff socks and donn shoes-assistance to tie right shoe. OT gave pt elastic shoe laces and wife is home with him who can assist as needed.  Educated to have wife with him 24/7. Also provided education on dressing technique.    OT Diagnosis:    OT Problem List:   OT Treatment Interventions:     OT Goals(Current goals can be found in the care plan section) Acute Rehab OT Goals Patient Stated Goal: To go home   Visit Information  Last OT Received On: 03/27/13 Assistance Needed: +1 History of Present Illness: s/p right anterior THA       Prior Functioning     Home Living Family/patient expects to be discharged to:: Private residence Living Arrangements: Spouse/significant other Available Help at Discharge: Available 24 hours/day Type of Home: House Home Access: Stairs to enter [employer] of Steps: 2 Entrance Stairs-Rails: None Home Layout: One level Home Equipment: Bedside commode;Walker - 2 wheels;Cane - single point Prior Function Level of Independence: Independent Communication Communication: No difficulties Dominant Hand: Right         Vision/Perception     Cognition  Cognition Arousal/Alertness: Awake/alert Behavior During Therapy: WFL for tasks assessed/performed Overall Cognitive Status: Within Functional Limits for tasks assessed    Extremity/Trunk Assessment Upper Extremity Assessment Upper Extremity Assessment: Overall WFL for tasks assessed      Mobility Bed Mobility Bed Mobility: Not assessed  Transfers Transfers: Sit to Stand;Stand to Sit Sit to Stand: 4: Min guard;With upper extremity assist;From chair/3-in-1 Stand to Sit: 4:  Min guard;With upper extremity assist;To chair/3-in-1 Details for Transfer Assistance: Minguard for safey with cues for hand placement        Balance     End of Session OT - End of Session Equipment Utilized During Treatment: Gait belt;Rolling walker Activity Tolerance: Patient tolerated treatment well Patient left: in chair;with call bell/phone within reach;with family/visitor present  Lorri Frederick OTR/L [phone] 03/27/2013, 5:39 PM

## 2013-03-27 NOTE — Transfer of Care (Signed)
Immediate Anesthesia Transfer of Care Note  Patient: Ronald Bullock  Procedure(s) Performed: Procedure(s): TOTAL HIP ARTHROPLASTY ANTERIOR APPROACH (Right)  Patient Location: PACU  Anesthesia Type:General  Level of Consciousness: awake, alert  and patient cooperative  Airway & Oxygen Therapy: Patient Spontanous Breathing and Patient connected to nasal cannula oxygen  Post-op Assessment: Report given to PACU RN, Post -op Vital signs reviewed and stable and Patient moving all extremities  Post vital signs: Reviewed and stable  Complications: No apparent anesthesia complications

## 2013-03-27 NOTE — Evaluation (Signed)
Physical Therapy Evaluation Patient Details Name: Ronald Bullock MRN: [id number] DOB: 01-Jul-1951 Today's Date: 03/27/2013 Time: 7829-5621 PT Time Calculation (min): 16 min  PT Assessment / Plan / Recommendation History of Present Illness  s/p right anterior THA  Clinical Impression  Pt is s/p anterior THA POD #0 resulting in the deficits listed below (see PT Problem List).   Pt moving well and very motivated to work with therapy.  Pt will benefit from skilled PT to increase their independence and safety with mobility to allow discharge to home with wife and HHPT.     PT Assessment  Patient needs continued PT services    Follow Up Recommendations  Home health PT;Supervision - Intermittent    Equipment Recommendations  None recommended by PT    Frequency 7X/week    Precautions / Restrictions Precautions Precautions: None Restrictions Weight Bearing Restrictions: Yes RLE Weight Bearing: Weight bearing as tolerated   Pertinent Vitals/Pain 2/10 right hip      Mobility  Bed Mobility Bed Mobility: Supine to Sit Supine to Sit: 4: Min assist;With rails;HOB elevated Details for Bed Mobility Assistance: (A) with right LE OOB.  Transfers Transfers: Sit to Stand;Stand to Sit Sit to Stand: 4: Min guard;From bed Stand to Sit: 4: Min guard;To chair/3-in-1 Details for Transfer Assistance: Minguard for safey with cues for hand placement Ambulation/Gait Ambulation/Gait Assistance: 4: Min guard Ambulation Distance (Feet): 10 Feet Assistive device: Rolling walker Ambulation/Gait Assistance Details: Minguard for safety Gait Pattern: Step-to pattern;Decreased stance time - right;Antalgic Gait velocity: decreased Stairs: No    Exercises Total Joint Exercises Ankle Circles/Pumps: AAROM;Strengthening;Both;10 reps   PT Diagnosis: Difficulty walking;Acute pain  PT Problem List: Decreased strength;Decreased activity tolerance;Decreased balance;Decreased mobility;Decreased knowledge  of use of DME;Pain PT Treatment Interventions: DME instruction;Gait training;Stair training;Functional mobility training;Therapeutic activities;Therapeutic exercise;Patient/family education     PT Goals(Current goals can be found in the care plan section) Acute Rehab PT Goals Patient Stated Goal: To go home  PT Goal Formulation: With patient Time For Goal Achievement: 04/03/13 Potential to Achieve Goals: Good  Visit Information  Last PT Received On: 03/27/13 Assistance Needed: +1 History of Present Illness: s/p right anterior THA       Prior Functioning  Home Living Family/patient expects to be discharged to:: Private residence Living Arrangements: Spouse/significant other Available Help at Discharge: Available 24 hours/day Type of Home: House Home Access: Stairs to enter [employer] of Steps: 2 Entrance Stairs-Rails: None Home Layout: One level Home Equipment: Bedside commode;Walker - 2 wheels;Cane - single point Prior Function Level of Independence: Independent Communication Communication: No difficulties Dominant Hand: Right    Cognition  Cognition Arousal/Alertness: Awake/alert Behavior During Therapy: WFL for tasks assessed/performed Overall Cognitive Status: Within Functional Limits for tasks assessed    Extremity/Trunk Assessment Lower Extremity Assessment Lower Extremity Assessment: RLE deficits/detail RLE Deficits / Details: due to surgery unable to fully assess   Balance    End of Session PT - End of Session Equipment Utilized During Treatment: Gait belt Activity Tolerance: Patient tolerated treatment well Patient left: in chair;with call bell/phone within reach;with family/visitor present Nurse Communication: Mobility status  GP     Zaylen Susman 03/27/2013, 4:09 PM  Jake Shark, PT DPT [phone]

## 2013-03-27 NOTE — Anesthesia Procedure Notes (Signed)
Procedure Name: Intubation Date/Time: 03/27/2013 7:48 AM Performed by: Orvilla Fus A Pre-anesthesia Checklist: Patient identified, Timeout performed, Emergency Drugs available, Suction available and Patient being monitored Patient Re-evaluated:Patient Re-evaluated prior to inductionOxygen Delivery Method: Circle system utilized Preoxygenation: Pre-oxygenation with 100% oxygen Intubation Type: IV induction Ventilation: Mask ventilation without difficulty Laryngoscope Size: Miller and 3 Grade View: Grade II Tube type: Oral Tube size: 7.5 mm Number of attempts: 2 (DLx1 MAC 4 grade II unable to pass bougie, DL x1 MDA Mil 3 grade II) Airway Equipment and Method: Bougie stylet and LTA kit utilized Placement Confirmation: ETT inserted through vocal cords under direct vision,  positive ETCO2 and breath sounds checked- equal and bilateral Secured at: 23 cm Tube secured with: Tape Dental Injury: Teeth and Oropharynx as per pre-operative assessment  Difficulty Due To: Difficult Airway- due to anterior larynx

## 2013-03-28 ENCOUNTER — Encounter (HOSPITAL_COMMUNITY): Payer: Self-pay | Admitting: General Practice

## 2013-03-28 LAB — BASIC METABOLIC PANEL
BUN: 29 mg/dL — ABNORMAL HIGH (ref 6–23)
Calcium: 8.1 mg/dL — ABNORMAL LOW (ref 8.4–10.5)
GFR calc Af Amer: 53 mL/min — ABNORMAL LOW (ref >90)
GFR calc non Af Amer: 46 mL/min — ABNORMAL LOW (ref >90)
Glucose, Bld: 138 mg/dL — ABNORMAL HIGH (ref 70–99)

## 2013-03-28 LAB — CBC
MCH: 31.1 pg (ref 26.0–34.0)
MCHC: 35.2 g/dL (ref 30.0–36.0)
Platelets: 161 10*3/uL (ref 150–400)

## 2013-03-28 MED ORDER — OXYCODONE-ACETAMINOPHEN 5-325 MG PO TABS
1.0000 | ORAL_TABLET | ORAL | Status: DC | PRN
  Administered 2013-03-28 – 2013-03-29 (×5): 1 via ORAL
  Filled 2013-03-28: qty 2
  Filled 2013-03-28 (×4): qty 1

## 2013-03-28 NOTE — Progress Notes (Signed)
Physical Therapy Treatment Patient Details Name: GERAN HAITHCOCK MRN: [id number] DOB: 12-08-1950 Today's Date: 03/28/2013 Time: 4098-1191 PT Time Calculation (min): 34 min  PT Assessment / Plan / Recommendation  History of Present Illness s/p right anterior THA   PT Comments   Completed stair training. Anticipate DC in AM  Follow Up Recommendations  Home health PT;Supervision - Intermittent     Does the patient have the potential to tolerate intense rehabilitation     Barriers to Discharge        Equipment Recommendations  None recommended by PT    Recommendations for Other Services    Frequency 7X/week   Progress towards PT Goals Progress towards PT goals: Progressing toward goals  Plan Current plan remains appropriate    Precautions / Restrictions Precautions Precautions: None Restrictions RLE Weight Bearing: Weight bearing as tolerated   Pertinent Vitals/Pain no apparent distress    Mobility  Bed Mobility Supine to Sit: 5: Supervision Transfers Sit to Stand: 6: Modified independent (Device/Increase time) Stand to Sit: 6: Modified independent (Device/Increase time) Ambulation/Gait Ambulation/Gait Assistance: 5: Supervision Ambulation Distance (Feet): 200 Feet Assistive device: Rolling walker Gait Pattern: Step-through pattern;Decreased stride length Stairs: Yes Stairs Assistance: 4: Min assist Stairs Assistance Details (indicate cue type and reason): A for RW.  Stair Management Technique: No rails Number of Stairs: 3    Exercises     PT Diagnosis:    PT Problem List:   PT Treatment Interventions:     PT Goals (current goals can now be found in the care plan section)    Visit Information  Last PT Received On: 03/28/13 Assistance Needed: +1 History of Present Illness: s/p right anterior THA    Subjective Data      Cognition  Cognition Arousal/Alertness: Awake/alert Behavior During Therapy: WFL for tasks assessed/performed Overall Cognitive  Status: Within Functional Limits for tasks assessed    Balance     End of Session PT - End of Session Equipment Utilized During Treatment: Gait belt Activity Tolerance: Patient tolerated treatment well Patient left: in chair;with call bell/phone within reach;with family/visitor present Nurse Communication: Mobility status   GP     Fredrich Birks 03/28/2013, 1:57 PM 03/28/2013 Fredrich Birks PTA [phone] pager [phone] office

## 2013-03-28 NOTE — Progress Notes (Addendum)
   CARE MANAGEMENT NOTE 03/28/2013  Patient:  Ronald Bullock, Ronald Bullock   Account Number:  [id number]  Date Initiated:  03/28/2013  Documentation initiated by:  [facility]  Subjective/Objective Assessment:   adm for Right Total hip arthroplasty     Action/Plan:   Anticipated DC Date:  03/29/2013   Anticipated DC Plan:  HOME W HOME HEALTH SERVICES      DC Planning Services  CM consult      [facility] Choice  HOME HEALTH   Choice offered to / List presented to:  C-1 Patient        HH arranged  HH-2 PT      [facility] agency  [facility]   Status of service:  Completed, signed off Medicare Important Message given?   (If response is "NO", the following Medicare IM given date fields will be blank) Date Medicare IM given:   Date Additional Medicare IM given:    Discharge Disposition:  HOME W HOME HEALTH SERVICES  Per UR Regulation:    If discussed at Long Length of Stay Meetings, dates discussed:    Comments:  03-28-13 14:20 Met with patient and patient's spouse in room and gave choice for Home Health; pt chose [facility] for home health physical therapy.  No DME neede as pt has rolling walker and 3n1 at home from a previous surgery in February 2014.  [facility] referral made for home health.  No other needs communicated for CM.  Freddy Jaksch, BSN, CM, [phone]

## 2013-03-28 NOTE — Progress Notes (Signed)
Subjective: 1 Day Post-Op Procedure(s) (LRB): TOTAL HIP ARTHROPLASTY ANTERIOR APPROACH (Right) Has occasional right arm numbness which has been problem for years  Activity level:  Up with PT Diet tolerance:  regular Voiding:  Foley just removed Patient reports pain as mild.    Objective: Vital signs in last 24 hours: Temp:  [97.4 F (36.3 C)-98.1 F (36.7 C)] 97.5 F (36.4 C) (08/13 0200) Pulse Rate:  [72-102] 84 (08/13 0200) Resp:  [10-18] 16 (08/13 0400) BP: (91-143)/(43-70) 91/43 mmHg (08/13 0200) SpO2:  [93 %-98 %] 97 % (08/13 0400)  Labs:  Recent Labs  03/28/13 0640  HGB 10.2*    Recent Labs  03/28/13 0640  WBC 7.5  RBC 3.28*  HCT 29.0*  PLT 161   No results found for this basename: NA, K, CL, CO2, BUN, CREATININE, GLUCOSE, CALCIUM,  in the last 72 hours No results found for this basename: LABPT, INR,  in the last 72 hours  Physical Exam:  ABD soft Neurovascular intact Sensation intact distally Intact pulses distally Compartment soft  Assessment/Plan:  1 Day Post-Op Procedure(s) (LRB): TOTAL HIP ARTHROPLASTY ANTERIOR APPROACH (Right) Up with therapy D/C IV fluids Plan for discharge tomorrow    Ronald Bullock G 03/28/2013, 7:30 AM

## 2013-03-28 NOTE — Progress Notes (Signed)
Physical Therapy Treatment Patient Details Name: Ronald Bullock MRN: [id number] DOB: 1951-06-09 Today's Date: 03/28/2013 Time: 6213-0865 PT Time Calculation (min): 25 min  PT Assessment / Plan / Recommendation  History of Present Illness s/p right anterior THA   PT Comments   Patient progressing well. Will attempt step this afternoon. Patient planning to DC tomorrow  Follow Up Recommendations  Home health PT;Supervision - Intermittent     Does the patient have the potential to tolerate intense rehabilitation     Barriers to Discharge        Equipment Recommendations  None recommended by PT    Recommendations for Other Services    Frequency 7X/week   Progress towards PT Goals Progress towards PT goals: Progressing toward goals  Plan Current plan remains appropriate    Precautions / Restrictions Precautions Precautions: None Restrictions RLE Weight Bearing: Weight bearing as tolerated   Pertinent Vitals/Pain no apparent distress     Mobility  Transfers Sit to Stand: 5: Supervision Stand to Sit: 5: Supervision Ambulation/Gait Ambulation/Gait Assistance: 4: Min guard Ambulation Distance (Feet): 150 Feet Assistive device: Rolling walker Ambulation/Gait Assistance Details: MinGuard for safety.  Gait Pattern: Step-to pattern;Decreased stance time - right    Exercises Total Joint Exercises Heel Slides: AAROM;Right;10 reps Hip ABduction/ADduction: AAROM;Right;10 reps Long Arc Quad: AROM;Right;10 reps   PT Diagnosis:    PT Problem List:   PT Treatment Interventions:     PT Goals (current goals can now be found in the care plan section)    Visit Information  Last PT Received On: 03/28/13 Assistance Needed: +1 History of Present Illness: s/p right anterior THA    Subjective Data      Cognition  Cognition Arousal/Alertness: Awake/alert Behavior During Therapy: WFL for tasks assessed/performed Overall Cognitive Status: Within Functional Limits for tasks  assessed    Balance     End of Session PT - End of Session Equipment Utilized During Treatment: Gait belt Activity Tolerance: Patient tolerated treatment well Patient left: in chair;with call bell/phone within reach;with family/visitor present Nurse Communication: Mobility status   GP     Fredrich Birks 03/28/2013, 8:57 AM 03/28/2013 Fredrich Birks PTA [phone] pager [phone] office

## 2013-03-28 NOTE — Progress Notes (Signed)
SW received a consult for possible placement. PT  At this time is recommending home with [facility] and not SNF. CM is  aware. Clinical Social Worker will sign off for now as social work intervention is no longer needed. Please consult [place] again if new need arises.   Sabino Niemann, MSW [phone]

## 2013-03-29 ENCOUNTER — Encounter (HOSPITAL_COMMUNITY): Payer: Self-pay | Admitting: Orthopaedic Surgery

## 2013-03-29 LAB — CBC
HCT: 26.6 % — ABNORMAL LOW (ref 39.0–52.0)
MCH: 31.5 pg (ref 26.0–34.0)
MCHC: 35.3 g/dL (ref 30.0–36.0)
MCV: 89.3 fL (ref 78.0–100.0)
Platelets: 148 10*3/uL — ABNORMAL LOW (ref 150–400)
RDW: 14.1 % (ref 11.5–15.5)

## 2013-03-29 MED ORDER — ASPIRIN 325 MG PO TBEC: 325.0000 mg | DELAYED_RELEASE_TABLET | Freq: Two times a day (BID) | ORAL | Status: DC

## 2013-03-29 MED ORDER — METHOCARBAMOL 500 MG PO TABS: 500.0000 mg | ORAL_TABLET | Freq: Four times a day (QID) | ORAL | Status: DC | PRN

## 2013-03-29 MED ORDER — OXYCODONE-ACETAMINOPHEN 5-325 MG PO TABS: 1.0000 | ORAL_TABLET | ORAL | Status: DC | PRN

## 2013-03-29 NOTE — Progress Notes (Signed)
Physical Therapy Treatment Patient Details Name: Ronald Bullock MRN: [id number] DOB: July 28, 1951 Today's Date: 03/29/2013 Time: 4098-1191 PT Time Calculation (min): 24 min  PT Assessment / Plan / Recommendation  History of Present Illness s/p right anterior THA   PT Comments   Patient progressing well. Wants to review steps with wife later this morning                                                                                Follow Up Recommendations  Home health PT;Supervision - Intermittent     Does the patient have the potential to tolerate intense rehabilitation     Barriers to Discharge        Equipment Recommendations  None recommended by PT    Recommendations for Other Services    Frequency 7X/week   Progress towards PT Goals Progress towards PT goals: Progressing toward goals  Plan Current plan remains appropriate    Precautions / Restrictions Precautions Precautions: None Restrictions RLE Weight Bearing: Weight bearing as tolerated   Pertinent Vitals/Pain no apparent distress     Mobility  Bed Mobility Bed Mobility: Not assessed Transfers Sit to Stand: 6: Modified independent (Device/Increase time) Stand to Sit: 6: Modified independent (Device/Increase time) Ambulation/Gait Ambulation/Gait Assistance: 5: Supervision Ambulation Distance (Feet): 200 Feet Assistive device: Rolling walker Gait Pattern: Step-through pattern;Decreased stride length Stairs: Yes Stairs Assistance: 4: Min assist Stairs Assistance Details (indicate cue type and reason): A for RW. Cues for technique Stair Management Technique: No rails Number of Stairs: 3    Exercises Total Joint Exercises Hip ABduction/ADduction: AROM;Right;Standing;10 reps Knee Flexion: AROM;Right;10 reps;Standing Standing Hip Extension: AROM;Right;10 reps;Standing   PT Diagnosis:    PT Problem List:   PT Treatment Interventions:     PT Goals (current goals can now be found in the care plan  section)    Visit Information  Last PT Received On: 03/29/13 Assistance Needed: +1 History of Present Illness: s/p right anterior THA    Subjective Data      Cognition  Cognition Arousal/Alertness: Awake/alert Behavior During Therapy: WFL for tasks assessed/performed Overall Cognitive Status: Within Functional Limits for tasks assessed    Balance     End of Session PT - End of Session Equipment Utilized During Treatment: Gait belt Activity Tolerance: Patient tolerated treatment well Patient left: in chair;with call bell/phone within reach;with family/visitor present Nurse Communication: Mobility status   GP     Fredrich Birks 03/29/2013, 11:35 AM 03/29/2013 Fredrich Birks PTA [phone] pager [phone] office

## 2013-03-29 NOTE — Progress Notes (Signed)
Physical Therapy Treatment Patient Details Name: Ronald Bullock MRN: [id number] DOB: 04-27-1951 Today's Date: 03/29/2013 Time: 3086-5784 PT Time Calculation (min): 11 min  PT Assessment / Plan / Recommendation  History of Present Illness s/p right anterior THA   PT Comments   Wife patient for stair training this session. Patient ready for DC based on goals met                                                              Follow Up Recommendations  Home health PT;Supervision - Intermittent     Does the patient have the potential to tolerate intense rehabilitation     Barriers to Discharge        Equipment Recommendations  None recommended by PT    Recommendations for Other Services    Frequency 7X/week   Progress towards PT Goals Progress towards PT goals: Progressing toward goals  Plan Current plan remains appropriate    Precautions / Restrictions Precautions Precautions: None Restrictions RLE Weight Bearing: Weight bearing as tolerated   Pertinent Vitals/Pain no apparent distress     Mobility  Bed Mobility Bed Mobility: Not assessed Transfers Sit to Stand: 6: Modified independent (Device/Increase time) Stand to Sit: 6: Modified independent (Device/Increase time) Ambulation/Gait Ambulation/Gait Assistance: 6: Modified independent (Device/Increase time) Ambulation Distance (Feet): 300 Feet Assistive device: Rolling walker Gait Pattern: Step-through pattern;Decreased stride length Stairs: Yes Stairs Assistance: 4: Min assist Stairs Assistance Details (indicate cue type and reason): Wife educated on stair training this session. Patient with correct technique Stair Management Technique: No rails Number of Stairs: 2    PT Diagnosis:    PT Problem List:   PT Treatment Interventions:     PT Goals (current goals can now be found in the care plan section)    Visit Information  Last PT Received On: 03/29/13 Assistance Needed: +1 History of Present Illness:  s/p right anterior THA    Subjective Data      Cognition  Cognition Arousal/Alertness: Awake/alert Behavior During Therapy: WFL for tasks assessed/performed Overall Cognitive Status: Within Functional Limits for tasks assessed    Balance     End of Session PT - End of Session Equipment Utilized During Treatment: Gait belt Activity Tolerance: Patient tolerated treatment well Patient left: in chair;with call bell/phone within reach;with family/visitor present Nurse Communication: Mobility status   GP     Fredrich Birks 03/29/2013, 11:37 AM 03/29/2013 Fredrich Birks PTA [phone] pager [phone] office

## 2013-03-29 NOTE — Plan of Care (Signed)
Problem: Acute Rehab PT Goals Goal: Pt/caregiver will Perform Home Exercise Program Outcome: Completed/Met Date Met:  03/29/13

## 2013-03-29 NOTE — Discharge Summary (Signed)
Patient ID: IBAN UTZ MRN: [id number] DOB/AGE: April 13, 1951 62 y.o.  Admit date: 03/27/2013 Discharge date: 03/29/2013  Admission Diagnoses:  Principal Problem:   DJD (degenerative joint disease) of hip   Discharge Diagnoses:  Same  Past Medical History  Diagnosis Date  . HLD (hyperlipidemia)     Low HDL  . Arthritis     L end stage knee DKD, mild R knee DJD, R hip end stage DJD  . OSA on CPAP   . Gout     rare flares  . Hyperglycemia   . Hypertension   . Osteoarthritis     R knee and hip, s/p L TKR    Surgeries: Procedure(s): TOTAL HIP ARTHROPLASTY ANTERIOR APPROACH on 03/27/2013   Consultants:    Discharged Condition: Improved  Hospital Course: KRISTINE TILEY is an 62 y.o. male who was admitted 03/27/2013 for operative treatment ofDJD (degenerative joint disease) of hip. Patient has severe unremitting pain that affects sleep, daily activities, and work/hobbies. After pre-op clearance the patient was taken to the operating room on 03/27/2013 and underwent  Procedure(s): TOTAL HIP ARTHROPLASTY ANTERIOR APPROACH.    Patient was given perioperative antibiotics: Anti-infectives   Start     Dose/Rate Route Frequency Ordered Stop   03/27/13 1400  ceFAZolin (ANCEF) IVPB 2 g/50 mL premix     2 g 100 mL/hr over 30 Minutes Intravenous Every 6 hours 03/27/13 1156 03/27/13 2111   03/27/13 0600  ceFAZolin (ANCEF) IVPB 2 g/50 mL premix     2 g 100 mL/hr over 30 Minutes Intravenous On call to O.R. 03/26/13 1443 03/27/13 0750       Patient was given sequential compression devices, early ambulation, and chemoprophylaxis to prevent DVT.  Patient benefited maximally from hospital stay and there were no complications.    Recent vital signs: Patient Vitals for the past 24 hrs:  BP Temp Temp src Pulse Resp SpO2  03/29/13 1046 115/68 mmHg - - - - -  03/29/13 0652 115/68 mmHg 98.7 F (37.1 C) Oral 84 18 99 %  03/29/13 0000 - - - - 16 -  03/28/13 2000 - - - - 18 -   03/28/13 1900 104/49 mmHg 100.1 F (37.8 C) Oral 93 18 98 %  03/28/13 1600 - - - - 18 -  03/28/13 1357 85/40 mmHg 97.7 F (36.5 C) - 86 18 98 %  03/28/13 1152 - - - - 18 -     Recent laboratory studies:  Recent Labs  03/28/13 0640 03/29/13 0635  WBC 7.5 7.6  HGB 10.2* 9.4*  HCT 29.0* 26.6*  PLT 161 148*  NA 134*  --   K 4.8  --   CL 99  --   CO2 25  --   BUN 29*  --   CREATININE 1.55*  --   GLUCOSE 138*  --   CALCIUM 8.1*  --      Discharge Medications:     Medication List    STOP taking these medications       meloxicam 15 MG tablet  Commonly known as:  MOBIC      TAKE these medications       acetaminophen 500 MG tablet  Commonly known as:  TYLENOL  Take 1,000 mg by mouth daily as needed for pain.     aspirin 325 MG EC tablet  Take 1 tablet (325 mg total) by mouth 2 (two) times daily.     lisinopril 10 MG tablet  Commonly known as:  PRINIVIL,ZESTRIL  Take 10 mg by mouth daily.     methocarbamol 500 MG tablet  Commonly known as:  ROBAXIN  Take 1 tablet (500 mg total) by mouth every 6 (six) hours as needed.     OMEGA-3-6-9 PO  Take 2 tablets by mouth daily with supper.     oxyCODONE-acetaminophen 5-325 MG per tablet  Commonly known as:  PERCOCET/ROXICET  Take 1-2 tablets by mouth every 4 (four) hours as needed.        Diagnostic Studies: Dg Hip Operative Right  2013/04/16   *RADIOLOGY REPORT*  Clinical Data: Right total hip arthroplasty.  OPERATIVE RIGHT HIP 1 VIEW  Comparison: None.  Findings: AP spot image of the right hip obtained with the C-arm fluoroscopic device and submitted for interpretation postoperatively demonstrates a right hip arthroplasty with anatomic alignment and no visible acute complicating features.  IMPRESSION: Anatomic alignment of the right hip arthroplasty in the AP projection without acute complicating features.   Original Report Authenticated By: Hulan Saas, M.D.    Disposition: 06-Home-Health Care Svc       Discharge Orders   Future Orders Complete By Expires   Call MD / Call 911  As directed    Comments:     If you experience chest pain or shortness of breath, CALL 911 and be transported to the hospital emergency room.  If you develope a fever above 101 F, pus (white drainage) or increased drainage or redness at the wound, or calf pain, call your surgeon's office.   Constipation Prevention  As directed    Comments:     Drink plenty of fluids.  Prune juice may be helpful.  You may use a stool softener, such as Colace (over the counter) 100 mg twice a day.  Use MiraLax (over the counter) for constipation as needed.   Diet - low sodium heart healthy  As directed    Increase activity slowly as tolerated  As directed       Follow-up Information   Follow up with DALLDORF,PETER G, MD. Call in 2 weeks.   Specialty:  Orthopedic Surgery   Contact information:   [address] [place] [address] [phone]       Follow up with [facility])    Contact information:   [address] [place] [address] [phone]        Signed: Prince Rome 03/29/2013, 11:24 AM

## 2013-03-29 NOTE — Progress Notes (Signed)
Subjective: 2 Days Post-Op Procedure(s) (LRB): TOTAL HIP ARTHROPLASTY ANTERIOR APPROACH (Right)  Activity level:  Weightbearing as tolerated doing well with therapy Diet tolerance:  Regular Voiding:  Okay Patient reports pain as 2 on 0-10 scale.    Objective: Vital signs in last 24 hours: Temp:  [97.7 F (36.5 C)-100.1 F (37.8 C)] 98.7 F (37.1 C) (08/14 0652) Pulse Rate:  [84-93] 84 (08/14 0652) Resp:  [16-18] 18 (08/14 0652) BP: (85-115)/(40-68) 115/68 mmHg (08/14 1046) SpO2:  [98 %-99 %] 99 % (08/14 0652)  Labs:  Recent Labs  03/28/13 0640 03/29/13 0635  HGB 10.2* 9.4*    Recent Labs  03/28/13 0640 03/29/13 0635  WBC 7.5 7.6  RBC 3.28* 2.98*  HCT 29.0* 26.6*  PLT 161 148*    Recent Labs  03/28/13 0640  NA 134*  K 4.8  CL 99  CO2 25  BUN 29*  CREATININE 1.55*  GLUCOSE 138*  CALCIUM 8.1*   No results found for this basename: LABPT, INR,  in the last 72 hours  Physical Exam:  Neurologically intact ABD soft Neurovascular intact Sensation intact distally Intact pulses distally Dorsiflexion/Plantar flexion intact Incision: dressing C/D/I No cellulitis present Compartment soft  Assessment/Plan:  2 Days Post-Op Procedure(s) (LRB): TOTAL HIP ARTHROPLASTY ANTERIOR APPROACH (Right) Advance diet Up with therapy Discharge home with home health ASA 325. 1 twice a day x4 weeks. Prescriptions for Percocet and Robaxin and ASA given Return to office in 2 weeks. Home health for therapy.    Ronald Bullock R 03/29/2013, 11:20 AM

## 2013-05-16 ENCOUNTER — Encounter: Payer: Self-pay | Admitting: Family Medicine

## 2013-06-05 ENCOUNTER — Other Ambulatory Visit: Payer: Self-pay | Admitting: Family Medicine

## 2013-06-21 ENCOUNTER — Other Ambulatory Visit: Payer: Self-pay

## 2013-10-21 ENCOUNTER — Other Ambulatory Visit: Payer: Self-pay | Admitting: Family Medicine

## 2013-11-06 ENCOUNTER — Other Ambulatory Visit: Payer: Self-pay | Admitting: Family Medicine

## 2013-12-01 ENCOUNTER — Other Ambulatory Visit: Payer: Self-pay | Admitting: Family Medicine

## 2013-12-30 ENCOUNTER — Other Ambulatory Visit: Payer: Self-pay | Admitting: Family Medicine

## 2014-01-26 ENCOUNTER — Ambulatory Visit (INDEPENDENT_AMBULATORY_CARE_PROVIDER_SITE_OTHER): Payer: BC Managed Care – PPO

## 2014-01-26 ENCOUNTER — Ambulatory Visit (INDEPENDENT_AMBULATORY_CARE_PROVIDER_SITE_OTHER): Payer: BC Managed Care – PPO | Admitting: Family Medicine

## 2014-01-26 VITALS — BP 138/62 | HR 82 | Temp 97.9°F | Resp 14 | Ht 72.5 in | Wt 242.6 lb

## 2014-01-26 DIAGNOSIS — M25539 Pain in unspecified wrist: Secondary | ICD-10-CM

## 2014-01-26 DIAGNOSIS — M25531 Pain in right wrist: Secondary | ICD-10-CM

## 2014-01-26 DIAGNOSIS — M109 Gout, unspecified: Secondary | ICD-10-CM

## 2014-01-26 LAB — POCT CBC
Granulocyte percent: 71 %G (ref 37–80)
HCT, POC: 40.3 % — AB (ref 43.5–53.7)
Hemoglobin: 13.3 g/dL — AB (ref 14.1–18.1)
LYMPH, POC: 1.7 (ref 0.6–3.4)
MCH: 30.2 pg (ref 27–31.2)
MCHC: 33 g/dL (ref 31.8–35.4)
MCV: 91.5 fL (ref 80–97)
MID (CBC): 0.7 (ref 0–0.9)
MPV: 8.3 fL (ref 0–99.8)
PLATELET COUNT, POC: 223 10*3/uL (ref 142–424)
POC Granulocyte: 5.8 (ref 2–6.9)
POC LYMPH %: 20.7 % (ref 10–50)
POC MID %: 8.3 % (ref 0–12)
RBC: 4.4 M/uL — AB (ref 4.69–6.13)
RDW, POC: 14.1 %
WBC: 8.1 10*3/uL (ref 4.6–10.2)

## 2014-01-26 LAB — URIC ACID: Uric Acid, Serum: 9.6 mg/dL — ABNORMAL HIGH (ref 4.0–7.8)

## 2014-01-26 MED ORDER — COLCHICINE 0.6 MG PO TABS: ORAL_TABLET | ORAL | Status: DC

## 2014-01-26 NOTE — Addendum Note (Signed)
Addended by: Venetia Night on: 01/26/2014 03:47 PM   Modules accepted: Level of Service

## 2014-01-26 NOTE — Patient Instructions (Signed)
Take colchicine 0.6 two initially, then one twice daily, then taper as able for gout.  Return if worse.

## 2014-01-26 NOTE — Progress Notes (Addendum)
63 year old man who is here with right wrist pain for the past couple of days. Knows of no specific injury. He is hurting bad enough that he just can't use the right wrist. He has been having some problems with numbness in the right hand in the palm and first 2 fingers. He has been wearing a splint night. He has a history of gout, last flare was right after he had his hip replaced last August. Otherwise he is doing well. He has a history of hypertension for which he takes lisinopril 10 mg daily, same dose he's been on for long time. Other regular education. He says he's been feeling the best that is felt a long time, doesn't have to do anything for aches and pains until this is flared up.  Objective: Right wrist appears a little swollen. There is a punctate dark spot at the base of the right thumb that I think he is just a little freckle and cannot tell for certain of it being a puncture wound, but cannot be certain of that. He is very tender at the carpal metacarpal area of the right from on the volar aspect. Essentially no range of motion of the right wrist. He has very poor grip of his right hand. There is mild erythema over the area.  Assessment: Gout versus cellulitis versus an unknown injury right wrist  Plan: CBC, uric acid, and x-ray wrist  Results for orders placed in visit on 01/26/14  POCT CBC      Result Value Ref Range   WBC 8.1  4.6 - 10.2 K/uL   Lymph, poc 1.7  0.6 - 3.4   POC LYMPH PERCENT 20.7  10 - 50 %L   MID (cbc) 0.7  0 - 0.9   POC MID % 8.3  0 - 12 %M   POC Granulocyte 5.8  2 - 6.9   Granulocyte percent 71.0  37 - 80 %G   RBC 4.40 (*) 4.69 - 6.13 M/uL   Hemoglobin 13.3 (*) 14.1 - 18.1 g/dL   HCT, POC 40.3 (*) 43.5 - 53.7 %   MCV 91.5  80 - 97 fL   MCH, POC 30.2  27 - 31.2 pg   MCHC 33.0  31.8 - 35.4 g/dL   RDW, POC 14.1     Platelet Count, POC 223  142 - 424 K/uL   MPV 8.3  0 - 99.8 fL   Will treat for gout. Return or call if worse. If he gets more inflamed and is  not doing better all probably add an antibiotic. I don't think this represents infection right now, but it is a little atypical for gout.  [facility] reading (PRIMARY) by  Dr. Linna Darner Normal wrist .

## 2014-01-27 ENCOUNTER — Other Ambulatory Visit: Payer: Self-pay | Admitting: Family Medicine

## 2014-01-29 ENCOUNTER — Encounter: Payer: Self-pay | Admitting: Family Medicine

## 2014-01-29 ENCOUNTER — Ambulatory Visit (INDEPENDENT_AMBULATORY_CARE_PROVIDER_SITE_OTHER): Payer: BC Managed Care – PPO | Admitting: Family Medicine

## 2014-01-29 VITALS — BP 136/68 | HR 72 | Temp 98.4°F | Wt 240.8 lb

## 2014-01-29 DIAGNOSIS — I10 Essential (primary) hypertension: Secondary | ICD-10-CM

## 2014-01-29 DIAGNOSIS — E785 Hyperlipidemia, unspecified: Secondary | ICD-10-CM

## 2014-01-29 DIAGNOSIS — M109 Gout, unspecified: Secondary | ICD-10-CM

## 2014-01-29 MED ORDER — LISINOPRIL 10 MG PO TABS: ORAL_TABLET | ORAL | Status: DC

## 2014-01-29 NOTE — Assessment & Plan Note (Signed)
Reviewed dx and treatment plans. Recommended daily urate lowering medication. Pt declines for now. Agrees to allopurinol if one more recurrent flare. Pt educational handout provided today.

## 2014-01-29 NOTE — Assessment & Plan Note (Addendum)
Chronic, stable. Continue lisinopril 10mg  daily. Return fasting for blood work - check Cr.

## 2014-01-29 NOTE — Patient Instructions (Addendum)
Return at your convenience fasting for blood work I've refilled lisinopril 10mg  daily. Good to see you today, call us with quesitons.  Gout Gout is an inflammatory arthritis caused by a buildup of uric acid crystals in the joints. Uric acid is a chemical that is normally present in the blood. When the level of uric acid in the blood is too high it can form crystals that deposit in your joints and tissues. This causes joint redness, soreness, and swelling (inflammation). Repeat attacks are common. Over time, uric acid crystals can form into masses (tophi) near a joint, destroying bone and causing disfigurement. Gout is treatable and often preventable. CAUSES  The disease begins with elevated levels of uric acid in the blood. Uric acid is produced by your body when it breaks down a naturally found substance called purines. Certain foods you eat, such as meats and fish, contain high amounts of purines. Causes of an elevated uric acid level include:  Being passed down from parent to child (heredity).  Diseases that cause increased uric acid production (such as obesity, psoriasis, and certain cancers).  Excessive alcohol use.  Diet, especially diets rich in meat and seafood.  Medicines, including certain cancer-fighting medicines (chemotherapy), water pills (diuretics), and aspirin.  Chronic kidney disease. The kidneys are no longer able to remove uric acid well.  Problems with metabolism. Conditions strongly associated with gout include:  Obesity.  High blood pressure.  High cholesterol.  Diabetes. Not everyone with elevated uric acid levels gets gout. It is not understood why some people get gout and others do not. Surgery, joint injury, and eating too much of certain foods are some of the factors that can lead to gout attacks. SYMPTOMS   An attack of gout comes on quickly. It causes intense pain with redness, swelling, and warmth in a joint.  Fever can occur.  Often, only one  joint is involved. Certain joints are more commonly involved:  Base of the big toe.  Knee.  Ankle.  Wrist.  Finger. Without treatment, an attack usually goes away in a few days to weeks. Between attacks, you usually will not have symptoms, which is different from many other forms of arthritis. DIAGNOSIS  Your caregiver will suspect gout based on your symptoms and exam. In some cases, tests may be recommended. The tests may include:  Blood tests.  Urine tests.  X-rays.  Joint fluid exam. This exam requires a needle to remove fluid from the joint (arthrocentesis). Using a microscope, gout is confirmed when uric acid crystals are seen in the joint fluid. TREATMENT  There are two phases to gout treatment: treating the sudden onset (acute) attack and preventing attacks (prophylaxis).  Treatment of an Acute Attack.  Medicines are used. These include anti-inflammatory medicines or steroid medicines.  An injection of steroid medicine into the affected joint is sometimes necessary.  The painful joint is rested. Movement can worsen the arthritis.  You may use warm or cold treatments on painful joints, depending which works best for you.  Treatment to Prevent Attacks.  If you suffer from frequent gout attacks, your caregiver may advise preventive medicine. These medicines are started after the acute attack subsides. These medicines either help your kidneys eliminate uric acid from your body or decrease your uric acid production. You may need to stay on these medicines for a very long time.  The early phase of treatment with preventive medicine can be associated with an increase in acute gout attacks. For this reason, during the  first few months of treatment, your caregiver may also advise you to take medicines usually used for acute gout treatment. Be sure you understand your caregiver's directions. Your caregiver may make several adjustments to your medicine dose before these medicines  are effective.  Discuss dietary treatment with your caregiver or dietitian. Alcohol and drinks high in sugar and fructose and foods such as meat, poultry, and seafood can increase uric acid levels. Your caregiver or dietician can advise you on drinks and foods that should be limited. HOME CARE INSTRUCTIONS   Do not take aspirin to relieve pain. This raises uric acid levels.  Only take over-the-counter or prescription medicines for pain, discomfort, or fever as directed by your caregiver.  Rest the joint as much as possible. When in bed, keep sheets and blankets off painful areas.  Keep the affected joint raised (elevated).  Apply warm or cold treatments to painful joints. Use of warm or cold treatments depends on which works best for you.  Use crutches if the painful joint is in your leg.  Drink enough fluids to keep your urine clear or pale yellow. This helps your body get rid of uric acid. Limit alcohol, sugary drinks, and fructose drinks.  Follow your dietary instructions. Pay careful attention to the amount of protein you eat. Your daily diet should emphasize fruits, vegetables, whole grains, and fat-free or low-fat milk products. Discuss the use of coffee, vitamin C, and cherries with your caregiver or dietician. These may be helpful in lowering uric acid levels.  Maintain a healthy body weight. SEEK MEDICAL CARE IF:   You develop diarrhea, vomiting, or any side effects from medicines.  You do not feel better in 24 hours, or you are getting worse. SEEK IMMEDIATE MEDICAL CARE IF:   Your joint becomes suddenly more tender, and you have chills or a fever. MAKE SURE YOU:   Understand these instructions.  Will watch your condition.  Will get help right away if you are not doing well or get worse. Document Released: 07/30/2000 Document Revised: 11/27/2012 Document Reviewed: 03/15/2012 [facility] Patient Information 2014 [employer].

## 2014-01-29 NOTE — Progress Notes (Signed)
Pre visit review using our clinic review tool, if applicable. No additional management support is needed unless otherwise documented below in the visit note. 

## 2014-01-29 NOTE — Progress Notes (Signed)
BP 136/68  Pulse 72  Temp(Src) 98.4 F (36.9 C) (Oral)  Wt 240 lb 12 oz (109.203 kg)   CC: med refill  Subjective:    Patient ID: Ronald Husky., male    DOB: 25-Oct-1950, 63 y.o.   MRN: [id number]  HPI: Ronald Bullock. is a 63 y.o. male presenting on 01/29/2014 for Follow-up and Medication Refill   Not seen by me since 2013. Presents for med refill visit.  Gout - recent increase in gout flares (L knee, R hip, then R wrist). Seen at [facility] over weekend with gout flare of L wrist. Controls with colchicine. Avoids organ meats, beer, EtOH.  HTN - Compliant with current antihypertensive regimen of lisinopril 10mg  daily.  Does not check blood pressures at home.  No low blood pressure readings or symptoms of dizziness/syncope.  Denies HA, vision changes, CP/tightness, SOB, leg swelling.   Wt Readings from Last 3 Encounters:  01/29/14 240 lb 12 oz (109.203 kg)  01/26/14 242 lb 9.6 oz (110.043 kg)  03/19/13 238 lb 14.4 oz (108.364 kg)    Relevant past medical, surgical, family and social history reviewed and updated as indicated.  Allergies and medications reviewed and updated. Current Outpatient Prescriptions on File Prior to Visit  Medication Sig  . colchicine 0.6 MG tablet Take 2 initially, then one twice daily for gout.  Ernestine Conrad 3-6-9 Fatty Acids (OMEGA-3-6-9 PO) Take 2 tablets by mouth daily with supper.   No current facility-administered medications on file prior to visit.    Review of Systems Per HPI unless specifically indicated above    Objective:    BP 136/68  Pulse 72  Temp(Src) 98.4 F (36.9 C) (Oral)  Wt 240 lb 12 oz (109.203 kg)  Physical Exam  Nursing note and vitals reviewed. Constitutional: He appears well-developed and well-nourished. No distress.  HENT:  Mouth/Throat: Oropharynx is clear and moist. No oropharyngeal exudate.  Cardiovascular: Normal rate, regular rhythm, normal heart sounds and intact distal pulses.   No murmur  heard. Pulmonary/Chest: Effort normal and breath sounds normal. No respiratory distress. He has no wheezes. He has no rales.  Musculoskeletal: He exhibits no edema.   Results for orders placed in visit on 01/26/14  URIC ACID      Result Value Ref Range   Uric Acid, Serum 9.6 (*) 4.0 - 7.8 mg/dL  POCT CBC      Result Value Ref Range   WBC 8.1  4.6 - 10.2 K/uL   Lymph, poc 1.7  0.6 - 3.4   POC LYMPH PERCENT 20.7  10 - 50 %L   MID (cbc) 0.7  0 - 0.9   POC MID % 8.3  0 - 12 %M   POC Granulocyte 5.8  2 - 6.9   Granulocyte percent 71.0  37 - 80 %G   RBC 4.40 (*) 4.69 - 6.13 M/uL   Hemoglobin 13.3 (*) 14.1 - 18.1 g/dL   HCT, POC 40.3 (*) 43.5 - 53.7 %   MCV 91.5  80 - 97 fL   MCH, POC 30.2  27 - 31.2 pg   MCHC 33.0  31.8 - 35.4 g/dL   RDW, POC 14.1     Platelet Count, POC 223  142 - 424 K/uL   MPV 8.3  0 - 99.8 fL      Assessment & Plan:   Problem List Items Addressed This Visit   HYPERTENSION, ESSENTIAL - Primary     Chronic, stable. Continue lisinopril 10mg   daily. Return fasting for blood work - check Cr.    Relevant Medications      lisinopril (PRINIVIL,ZESTRIL) tablet   Other Relevant Orders      Renal function panel   HLD (hyperlipidemia)   Relevant Medications      lisinopril (PRINIVIL,ZESTRIL) tablet   Other Relevant Orders      Lipid panel   Gout, unspecified     Reviewed dx and treatment plans. Recommended daily urate lowering medication. Pt declines for now. Agrees to allopurinol if one more recurrent flare. Pt educational handout provided today.        Follow up plan: Return in about 6 months (around 07/31/2014), or as needed, for follow up visit.

## 2014-01-30 ENCOUNTER — Telehealth: Payer: Self-pay | Admitting: Family Medicine

## 2014-01-30 NOTE — Telephone Encounter (Signed)
Relevant patient education assigned to patient using Emmi. ° °

## 2014-01-31 ENCOUNTER — Other Ambulatory Visit (INDEPENDENT_AMBULATORY_CARE_PROVIDER_SITE_OTHER): Payer: BC Managed Care – PPO

## 2014-01-31 DIAGNOSIS — E785 Hyperlipidemia, unspecified: Secondary | ICD-10-CM

## 2014-01-31 DIAGNOSIS — I10 Essential (primary) hypertension: Secondary | ICD-10-CM

## 2014-01-31 LAB — LIPID PANEL
CHOL/HDL RATIO: 6
Cholesterol: 140 mg/dL (ref 0–200)
HDL: 25.4 mg/dL — ABNORMAL LOW (ref >39.00)
LDL CALC: 87 mg/dL (ref 0–99)
NONHDL: 114.6
Triglycerides: 137 mg/dL (ref 0.0–149.0)
VLDL: 27.4 mg/dL (ref 0.0–40.0)

## 2014-01-31 LAB — RENAL FUNCTION PANEL
Albumin: 4 g/dL (ref 3.5–5.2)
BUN: 16 mg/dL (ref 6–23)
CHLORIDE: 105 meq/L (ref 96–112)
CO2: 32 mEq/L (ref 19–32)
Calcium: 9.2 mg/dL (ref 8.4–10.5)
Creatinine, Ser: 1.2 mg/dL (ref 0.4–1.5)
GFR: 67.93 mL/min (ref >60.00)
GLUCOSE: 115 mg/dL — AB (ref 70–99)
PHOSPHORUS: 4 mg/dL (ref 2.3–4.6)
POTASSIUM: 4.4 meq/L (ref 3.5–5.1)
Sodium: 140 mEq/L (ref 135–145)

## 2014-02-04 ENCOUNTER — Encounter: Payer: Self-pay | Admitting: *Deleted

## 2014-03-08 ENCOUNTER — Encounter: Payer: Self-pay | Admitting: Family Medicine

## 2014-03-08 ENCOUNTER — Encounter (INDEPENDENT_AMBULATORY_CARE_PROVIDER_SITE_OTHER): Payer: Self-pay

## 2014-03-08 ENCOUNTER — Ambulatory Visit (INDEPENDENT_AMBULATORY_CARE_PROVIDER_SITE_OTHER): Payer: BC Managed Care – PPO | Admitting: Family Medicine

## 2014-03-08 VITALS — BP 128/70 | HR 78 | Temp 98.1°F | Wt 244.0 lb

## 2014-03-08 DIAGNOSIS — M25539 Pain in unspecified wrist: Secondary | ICD-10-CM

## 2014-03-08 DIAGNOSIS — M79641 Pain in right hand: Secondary | ICD-10-CM | POA: Insufficient documentation

## 2014-03-08 DIAGNOSIS — M25531 Pain in right wrist: Secondary | ICD-10-CM

## 2014-03-08 DIAGNOSIS — M79609 Pain in unspecified limb: Secondary | ICD-10-CM

## 2014-03-08 DIAGNOSIS — M109 Gout, unspecified: Secondary | ICD-10-CM

## 2014-03-08 MED ORDER — PREDNISONE 20 MG PO TABS: ORAL_TABLET | ORAL | Status: DC

## 2014-03-08 MED ORDER — COLCHICINE 0.6 MG PO TABS: ORAL_TABLET | ORAL | Status: DC

## 2014-03-08 MED ORDER — ALLOPURINOL 100 MG PO TABS: 100.0000 mg | ORAL_TABLET | Freq: Every day | ORAL | Status: DC

## 2014-03-08 NOTE — Progress Notes (Signed)
Pre visit review using our clinic review tool, if applicable. No additional management support is needed unless otherwise documented below in the visit note. 

## 2014-03-08 NOTE — Patient Instructions (Signed)
Let's start allopurinol 100mg  daily. Take mobic around commencement of allopurinol. If gout flare occurs, may take colchicine and fill prednisone prescription provided today. Update me with effect of allopurinol.  If not helping, may consider increased dose to 2 pills daily. Let me know if you'd like referral to hand doctor to further evaluation carpal tunnel syndrome.

## 2014-03-08 NOTE — Addendum Note (Signed)
Addended by: [name] on: 03/08/2014 09:32 AM   Modules accepted: Orders
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
## 2014-03-08 NOTE — Addendum Note (Signed)
Addended by: Lurlean Nanny on: 03/08/2014 09:45 AM   Modules accepted: Orders

## 2014-03-08 NOTE — Assessment & Plan Note (Signed)
Anticipate combination of gout leading to chronic wrist swelling and CTS R>L. Will start allopurinol at 100mg  daily - discussed meloxicam use while starting med. If persistent discomfort to let me know for steroid course. Pt declines referral to hand for NCS for now, would like to get gout under better control.

## 2014-03-18 ENCOUNTER — Telehealth: Payer: Self-pay | Admitting: Family Medicine

## 2014-03-18 DIAGNOSIS — R202 Paresthesia of skin: Secondary | ICD-10-CM

## 2014-03-18 MED ORDER — PREDNISONE 20 MG PO TABS: ORAL_TABLET | ORAL | Status: DC

## 2014-03-18 NOTE — Telephone Encounter (Signed)
Patient says he did not pick up the Prednisone after the last OV because he understood that he was to take that if the Allopurinol didn't help the gout.  He says the gout issues have subsided and the nerve-ending/tingling/numbness is what is bothersome now. Patient says that no matter how he holds his hand, his first 3 fingers go numb.  Should he still get the Prednisone or was that indeed for the gout symptoms?

## 2014-03-18 NOTE — Telephone Encounter (Signed)
Sent.  Stop meloxicam while on prednisone.  Thanks.

## 2014-03-18 NOTE — Telephone Encounter (Signed)
Per last office note-Dr. G advised for patient to call back and he would send in steroid course. Please advise. Thanks.

## 2014-03-18 NOTE — Telephone Encounter (Signed)
Pt called to let you know that he is still having nerve ending problems and wanted to know how much longer he needed to take the med for gout

## 2014-03-19 NOTE — Telephone Encounter (Signed)
Patient advised.

## 2014-03-19 NOTE — Telephone Encounter (Signed)
The pred was for the gout.  I wouldn't start it now.  Per G's note, if the tingling continues, then referral to hand for NCS.  I put in the referral.  Thanks.

## 2014-03-21 NOTE — Telephone Encounter (Signed)
Patient advised.

## 2014-03-21 NOTE — Telephone Encounter (Signed)
Continue allopurinol, start prednisone- with food.  Take concurrently.  Thanks.

## 2014-03-21 NOTE — Telephone Encounter (Addendum)
Pt left v/m; pt has appt with nerve specialist. Pt has been taking allopurinol for 2 weeks but now thinks having a gout flare up and wants to know if should take the prednisone. Left v/m for pt to cb; when did gout flare up start? 03/18/14 note said gout issues had subsided.Pt called back; 03/20/14 lt big toe was very sore,slight swelling; no redness. Today pt said still slight swelling in big toe and tender to touch today but still no redness.  Pt wants to know if should continue allopurinol and start prednisone. No injury to toe. Pt request cb.

## 2014-04-05 ENCOUNTER — Encounter: Payer: Self-pay | Admitting: Family Medicine

## 2014-04-05 ENCOUNTER — Ambulatory Visit (INDEPENDENT_AMBULATORY_CARE_PROVIDER_SITE_OTHER): Payer: BC Managed Care – PPO | Admitting: Family Medicine

## 2014-04-05 ENCOUNTER — Telehealth: Payer: Self-pay

## 2014-04-05 VITALS — BP 130/70 | HR 84 | Temp 98.3°F | Wt 244.5 lb

## 2014-04-05 DIAGNOSIS — M109 Gout, unspecified: Secondary | ICD-10-CM

## 2014-04-05 MED ORDER — PREDNISONE 20 MG PO TABS: ORAL_TABLET | ORAL | Status: DC

## 2014-04-05 NOTE — Progress Notes (Signed)
   BP 130/70  Pulse 84  Temp(Src) 98.3 F (36.8 C) (Oral)  Wt 244 lb 8 oz (110.904 kg)   CC: gout flare  Subjective:    Patient ID: Ronald Husky., male    DOB: 1951-05-25, 63 y.o.   MRN: [id number]  HPI: Ronald Massi. is a 63 y.o. male presenting on 04/05/2014 for Gout   See prior note for details Seen 03/08/2014 with acute gouty attack of right hand.  Treated with prednisone course and started on allopurinol daily at 100mg . Pt did improve with this treatment and self stopped allopurinol.  sxs of gout flare started yesterday - R ankle and wrist. Very tender with stiffness and swelling but no significant erythema or warmth. Took meloxicam 15mg  today, restarted allopurinol 100mg  yesterday.  Relevant past medical, surgical, family and social history reviewed and updated as indicated.  Allergies and medications reviewed and updated. Current Outpatient Prescriptions on File Prior to Visit  Medication Sig  . allopurinol (ZYLOPRIM) 100 MG tablet Take 1 tablet (100 mg total) by mouth daily.  Marland Kitchen lisinopril (PRINIVIL,ZESTRIL) 10 MG tablet TAKE 1 TABLET DAILY  . meloxicam (MOBIC) 15 MG tablet Take 15 mg by mouth as needed for pain.  Ernestine Conrad 3-6-9 Fatty Acids (OMEGA-3-6-9 PO) Take 2 tablets by mouth daily with supper.  . colchicine 0.6 MG tablet Take 2 initially, then one twice daily for gout.   No current facility-administered medications on file prior to visit.    Review of Systems Per HPI unless specifically indicated above    Objective:    BP 130/70  Pulse 84  Temp(Src) 98.3 F (36.8 C) (Oral)  Wt 244 lb 8 oz (110.904 kg)  Physical Exam  Nursing note and vitals reviewed. Constitutional: He appears well-developed and well-nourished. No distress.  Musculoskeletal: He exhibits edema.  R wrist and R lateral ankle tender swelling without marked erythema or warmth       Assessment & Plan:   Problem List Items Addressed This Visit   Gout, unspecified - Primary       Currently in acute gout flare - will place on another prednisone taper for acute attack, then recommended restart allopurinol 100mg  daily while completing prednisone taper. Discussed colchicine use and gout diet. Hold meloxicam while on steroid.        Follow up plan: Return if symptoms worsen or fail to improve.

## 2014-04-05 NOTE — Assessment & Plan Note (Signed)
Currently in acute gout flare - will place on another prednisone taper for acute attack, then recommended restart allopurinol 100mg  daily while completing prednisone taper. Discussed colchicine use and gout diet. Hold meloxicam while on steroid.

## 2014-04-05 NOTE — Telephone Encounter (Signed)
Patient notified as instructed by telephone. Pt scheduled appt to see Dr Darnell Level today at 12:45 pm.

## 2014-04-05 NOTE — Telephone Encounter (Signed)
rec appointment to discuss. Could see me at 12:45pm or if that time not convenience schedule appt with another provider.

## 2014-04-05 NOTE — Telephone Encounter (Signed)
Pt left v/m; finished prednisone and stopped allopurinol on 03/28/14, gout appeared to be cleared. On 04/04/14 gout symptoms returned to rt hand and rt foot; pt restarted allopurinol on 04/04/14; pt wants to know if should continue allopurinol or should pt come in for f/u appt. Pt request cb.

## 2014-04-05 NOTE — Patient Instructions (Signed)
For recurrent gout flare, take another prednisone course starting today for 7 days. Towards end of prednisone may restart allopurinol daily  May fill colchicine if needed for gout symptoms. Allopurinol will be daily preventative medicine to take longterm.

## 2014-04-05 NOTE — Progress Notes (Signed)
Pre visit review using our clinic review tool, if applicable. No additional management support is needed unless otherwise documented below in the visit note. 

## 2014-08-18 ENCOUNTER — Ambulatory Visit (INDEPENDENT_AMBULATORY_CARE_PROVIDER_SITE_OTHER): Payer: Medicare Other | Admitting: Family Medicine

## 2014-08-18 VITALS — BP 120/64 | HR 89 | Temp 98.6°F | Resp 18 | Ht 72.5 in | Wt 239.0 lb

## 2014-08-18 DIAGNOSIS — M109 Gout, unspecified: Secondary | ICD-10-CM | POA: Diagnosis not present

## 2014-08-18 DIAGNOSIS — M25531 Pain in right wrist: Secondary | ICD-10-CM | POA: Diagnosis not present

## 2014-08-18 MED ORDER — COLCHICINE 0.6 MG PO TABS: ORAL_TABLET | ORAL | Status: DC

## 2014-08-18 NOTE — Patient Instructions (Signed)

## 2014-08-18 NOTE — Progress Notes (Signed)
° °  Subjective:    Patient ID: Ronald Husky., male    DOB: July 05, 1951, 64 y.o.   MRN: [id number]  HPI Chief Complaint  Patient presents with   Gout    right hand   Fatigue   This chart was scribed for Robyn Haber, MD by Thea Alken, ED Scribe. This patient was seen in room 13 and the patient's care was started at 12:59 PM.  HPI Comments: Ronald Geibel. is a 64 y.o. male  With hx of gout for the past 30 years who presents to the [facility] complaining of gout flare up in right wrist and right hand onset 3 days. Pt states for the past 11 months he's had gout flare ups all in right hand. He reports flare up maybe due to the diet he's had over the holiday which consisted of rich foods.   Pt is Secretary/administrator. working with cars.    Past Medical History  Diagnosis Date   HLD (hyperlipidemia)     Low HDL   Arthritis     L end stage knee DKD, mild R knee DJD, R hip end stage DJD   OSA on CPAP    Gout     rare flares   Hyperglycemia    Hypertension    Osteoarthritis     R knee and hip, s/p L TKR   No Known Allergies Prior to Admission medications   Medication Sig Start Date End Date Taking? Authorizing Provider  allopurinol (ZYLOPRIM) 100 MG tablet Take 1 tablet (100 mg total) by mouth daily. 03/08/14  Yes [name], MD  lisinopril (PRINIVIL,ZESTRIL) 10 MG tablet TAKE 1 TABLET DAILY 01/29/14  Yes [name], MD  Omega 3-6-9 Fatty Acids (OMEGA-3-6-9 PO) Take 2 tablets by mouth daily with supper.   Yes Historical Provider, MD  colchicine 0.6 MG tablet Take 2 initially, then one twice daily for gout. Patient not taking: Reported on 08/18/2014 03/08/14   [name], MD    Review of Systems  Musculoskeletal: Positive for arthralgias.       Objective:   Physical Exam  Constitutional: He is oriented to person, place, and time. He appears well-developed and well-nourished. No distress.  HENT:  Head: Normocephalic  and atraumatic.  Eyes: Conjunctivae and EOM are normal.  Neck: Neck supple.  Cardiovascular: Normal rate.   Pulmonary/Chest: Effort normal.  Musculoskeletal: Normal range of motion.  swollen tender right wrist  Neurological: He is alert and oriented to person, place, and time.  Skin: Skin is warm and dry.  Psychiatric: He has a normal mood and affect. His behavior is normal.  Nursing note and vitals reviewed.   Assessment & Plan:   1. Wrist pain, right   2. Acute gout, unspecified cause, unspecified site    Meds ordered this encounter  Medications   colchicine 0.6 MG tablet    Sig: Take 2 initially, then one twice daily for gout.    Dispense:  30 tablet    Refill:  5   This chart was scribed in my presence and reviewed by me personally.    ICD-9-CM ICD-10-CM   1. Wrist pain, right 719.43 M25.531 colchicine 0.6 MG tablet  2. Acute gout, unspecified cause, unspecified site 274.01 M10.9 colchicine 0.6 MG tablet     Signed, Robyn Haber, MD

## 2014-08-19 ENCOUNTER — Telehealth: Payer: Self-pay

## 2014-08-19 NOTE — Telephone Encounter (Signed)
Pt was seen 08/18/2014 at [facility] for gout.

## 2014-08-19 NOTE — Telephone Encounter (Signed)
PLEASE NOTE: All timestamps contained within this report are represented as [place] Standard Time. CONFIDENTIALTY NOTICE: This fax transmission is intended only for the addressee. It contains information that is legally privileged, confidential or otherwise protected from use or disclosure. If you are not the intended recipient, you are strictly prohibited from reviewing, disclosing, copying using or disseminating any of this information or taking any action in reliance on or regarding this information. If you have received this fax in error, please notify us immediately by telephone so that we can arrange for its return to [place]. Phone: [phone], Toll-Free: [phone], Fax: [phone] Page: 1 of 2 Call Id: [id number] [place] Patient Name: Ronald Bullock Gender: Male DOB: September 21, 1950 Age: 64 Y 1 M 4 D Return Phone Number: [phone] (Primary) Address: [address] [place] [address] Client [place] Night - Client Client Site [place] [facility] Physician [name] Contact Type Call Call Type Triage / Clinical Relationship To Patient Self Return Phone Number [phone] (Primary) Chief Complaint Hand Pain Initial Comment caller states he has gout in his hand and is in pain [place] See MD within 4 hrs PreDisposition Go to Urgent Care/Walk-In Clinic Nurse Assessment Nurse: Venetia Maxon, RN, Manuela Schwartz Date/Time (Eastern Time): 08/17/2014 12:14:06 PM Confirm and document reason for call. If symptomatic, describe symptoms. ---caller states he has gout in his RT hand and is in pain . Began to hurt on Thurs He is taking Allopurinol. He had this last year Jan or Feb and that med helped. He has been on this for a while. No foot pain . Has the patient traveled out of the country within the last  30 days? ---No Does the patient require triage? ---Yes Related visit to physician within the last 2 weeks? ---No Does the PT have any chronic conditions? (i.e. diabetes, asthma, etc.) ---Yes List chronic conditions. ---HTN arthritis Guidelines Guideline Title Affirmed Question Affirmed Notes Nurse Date/Time (Eastern Time) Hand and Wrist Pain [1] SEVERE pain (e.g., excruciating, unable to use hand at all) AND [2] not improved after 2 hours of pain medicine Venetia Maxon, RN, Manuela Schwartz 08/17/2014 12:16:17 PM Disp. Time Eilene Ghazi Time) Disposition Final User 08/17/2014 11:07:43 AM Send To Clinical Follow Up Jeral Fruit 08/17/2014 12:17:04 PM See Physician within 4 Hours (or PCP triage) Yes Venetia Maxon, RN, Liliane Bade NOTE: All timestamps contained within this report are represented as [place] Standard Time. CONFIDENTIALTY NOTICE: This fax transmission is intended only for the addressee. It contains information that is legally privileged, confidential or otherwise protected from use or disclosure. If you are not the intended recipient, you are strictly prohibited from reviewing, disclosing, copying using or disseminating any of this information or taking any action in reliance on or regarding this information. If you have received this fax in error, please notify us immediately by telephone so that we can arrange for its return to [place]. Phone: [phone], Toll-Free: [phone], Fax: [phone] Page: 2 of 2 Call Id: [id number] [place] Understands: Yes Disagree/Comply: Comply Care Advice Given Per Guideline SEE PHYSICIAN WITHIN 4 HOURS (or PCP triage): CALL BACK IF: * You become worse. CARE ADVICE given per Hand and Wrist Pain (Adult) guideline. After Care Instructions Given Call Event Type User Date / Time Description Referrals [facility]

## 2014-09-20 ENCOUNTER — Encounter: Payer: Self-pay | Admitting: Family Medicine

## 2014-09-20 ENCOUNTER — Ambulatory Visit (INDEPENDENT_AMBULATORY_CARE_PROVIDER_SITE_OTHER): Payer: Medicare Other | Admitting: Family Medicine

## 2014-09-20 VITALS — BP 136/72 | HR 60 | Temp 98.0°F | Wt 244.5 lb

## 2014-09-20 DIAGNOSIS — M1A09X1 Idiopathic chronic gout, multiple sites, with tophus (tophi): Secondary | ICD-10-CM

## 2014-09-20 DIAGNOSIS — E669 Obesity, unspecified: Secondary | ICD-10-CM | POA: Diagnosis not present

## 2014-09-20 MED ORDER — NAPROXEN 500 MG PO TABS: ORAL_TABLET | ORAL | Status: DC

## 2014-09-20 MED ORDER — ALLOPURINOL 300 MG PO TABS: 300.0000 mg | ORAL_TABLET | Freq: Every day | ORAL | Status: DC

## 2014-09-20 NOTE — Progress Notes (Signed)
   BP 136/72 mmHg  Pulse 60  Temp(Src) 98 F (36.7 C) (Oral)  Wt 244 lb 8 oz (110.904 kg)  SpO2 98%   CC: leg cramp, weight gain  Subjective:    Patient ID: Ronald Bullock., male    DOB: 09-08-1950, 64 y.o.   MRN: [id number]  HPI: Ronald Bullock. is a 64 y.o. male presenting on 09/20/2014 for Weight Gain and Cramp in leg   Multiple recurrent gout flares in the past year in right hand despite daily allopurinol 100mg . Seen at [facility] and treated with colchicine (has 15 tabs left at home). Last urate 9.6 (01/2014). Having some twinges of pain in left foo as well sa recently noted some hard deposits on elbows and aches/stiff joints throughout.   Weight gain noted as well.   Allopurinol was started about 1 year ago.   Relevant past medical, surgical, family and social history reviewed and updated as indicated. Interim medical history since our last visit reviewed. Allergies and medications reviewed and updated. Current Outpatient Prescriptions on File Prior to Visit  Medication Sig  . lisinopril (PRINIVIL,ZESTRIL) 10 MG tablet TAKE 1 TABLET DAILY  . Omega 3-6-9 Fatty Acids (OMEGA-3-6-9 PO) Take 2 tablets by mouth daily with supper.  . colchicine 0.6 MG tablet Take 2 initially, then one twice daily for gout. (Patient not taking: Reported on 09/20/2014)   No current facility-administered medications on file prior to visit.    Review of Systems Per HPI unless specifically indicated above     Objective:    BP 136/72 mmHg  Pulse 60  Temp(Src) 98 F (36.7 C) (Oral)  Wt 244 lb 8 oz (110.904 kg)  SpO2 98%  Wt Readings from Last 3 Encounters:  09/20/14 244 lb 8 oz (110.904 kg)  08/18/14 239 lb (108.41 kg)  04/05/14 244 lb 8 oz (110.904 kg)    Physical Exam  Constitutional: He appears well-developed and well-nourished. No distress.  HENT:  Mouth/Throat: Oropharynx is clear and moist. No oropharyngeal exudate.  Musculoskeletal: He exhibits no edema.  tophaceous deposits  on bilateral elbows No active arthritis  Skin: Skin is warm and dry. No rash noted.  Nursing note and vitals reviewed.      Assessment & Plan:   Problem List Items Addressed This Visit    Obesity    Body mass index is 32.69 kg/(m^2).  Discussed healthy lifestyle changes to affect sustainable weight loss.      Gout - Primary    Recent increase in gout flares - will slowly titrate allopurinol up to 300mg  with goal urate <6.  Discussed etiology of gout and tophi and goal urate levels.  Pt has colchicine at home, provided with naprosyn course prn gout flare during titration.      Relevant Orders   Uric acid   Renal function panel       Follow up plan: Return if symptoms worsen or fail to improve.

## 2014-09-20 NOTE — Patient Instructions (Signed)
I think this is gout flare from too high uric acid levels.  Increase allopurinol to 200mg  daily until you run out. New dose will be 300mg  daily (sent to pharmacy). Watch for gout flare with allopurinol dose changes - and if it happens may start colchicine again. Prescription for anti inflammatory provided as needed. After you've been regularly taking 300mg  dose of allopurinol for 1 month, return here for lab visit to check uric acid levels.  Goal uric acid <6 to reverse tophi deposits and prevent gout flares.

## 2014-09-20 NOTE — Assessment & Plan Note (Addendum)
Recent increase in gout flares - will slowly titrate allopurinol up to 300mg  with goal urate <6.  Discussed etiology of gout and tophi and goal urate levels.  Pt has colchicine at home, provided with naprosyn course prn gout flare during titration.

## 2014-09-20 NOTE — Progress Notes (Signed)
Pre visit review using our clinic review tool, if applicable. No additional management support is needed unless otherwise documented below in the visit note. 

## 2014-09-20 NOTE — Assessment & Plan Note (Signed)
Body mass index is 32.69 kg/(m^2).  Discussed healthy lifestyle changes to affect sustainable weight loss.

## 2014-09-26 ENCOUNTER — Other Ambulatory Visit: Payer: Self-pay | Admitting: Family Medicine

## 2014-10-07 DIAGNOSIS — H2512 Age-related nuclear cataract, left eye: Secondary | ICD-10-CM | POA: Diagnosis not present

## 2014-11-04 ENCOUNTER — Other Ambulatory Visit (INDEPENDENT_AMBULATORY_CARE_PROVIDER_SITE_OTHER): Payer: Medicare Other

## 2014-11-04 DIAGNOSIS — M1A09X1 Idiopathic chronic gout, multiple sites, with tophus (tophi): Secondary | ICD-10-CM | POA: Diagnosis not present

## 2014-11-04 LAB — RENAL FUNCTION PANEL
Albumin: 4.1 g/dL (ref 3.5–5.2)
BUN: 24 mg/dL — AB (ref 6–23)
CO2: 27 mEq/L (ref 19–32)
Calcium: 9.2 mg/dL (ref 8.4–10.5)
Chloride: 102 mEq/L (ref 96–112)
Creatinine, Ser: 1.22 mg/dL (ref 0.40–1.50)
GFR: 63.3 mL/min (ref >60.00)
GLUCOSE: 120 mg/dL — AB (ref 70–99)
PHOSPHORUS: 3.7 mg/dL (ref 2.3–4.6)
Potassium: 4.5 mEq/L (ref 3.5–5.1)
Sodium: 136 mEq/L (ref 135–145)

## 2014-11-04 LAB — URIC ACID: URIC ACID, SERUM: 6.3 mg/dL (ref 4.0–7.8)

## 2014-11-05 ENCOUNTER — Encounter: Payer: Self-pay | Admitting: *Deleted

## 2014-11-15 DIAGNOSIS — M48061 Spinal stenosis, lumbar region without neurogenic claudication: Secondary | ICD-10-CM

## 2014-11-15 HISTORY — PX: OTHER SURGICAL HISTORY: SHX169

## 2014-11-15 HISTORY — DX: Spinal stenosis, lumbar region without neurogenic claudication: M48.061

## 2014-11-19 DIAGNOSIS — M5416 Radiculopathy, lumbar region: Secondary | ICD-10-CM | POA: Diagnosis not present

## 2014-11-26 DIAGNOSIS — M47816 Spondylosis without myelopathy or radiculopathy, lumbar region: Secondary | ICD-10-CM | POA: Diagnosis not present

## 2014-12-13 DIAGNOSIS — M5416 Radiculopathy, lumbar region: Secondary | ICD-10-CM | POA: Diagnosis not present

## 2014-12-16 ENCOUNTER — Encounter: Payer: Self-pay | Admitting: Gastroenterology

## 2014-12-16 DIAGNOSIS — M545 Low back pain: Secondary | ICD-10-CM | POA: Diagnosis not present

## 2014-12-16 DIAGNOSIS — M4806 Spinal stenosis, lumbar region: Secondary | ICD-10-CM | POA: Diagnosis not present

## 2014-12-17 ENCOUNTER — Inpatient Hospital Stay
Admission: RE | Admit: 2014-12-17 | Discharge: 2014-12-17 | Disposition: A | Discharge status: Home / Self Care | Payer: Medicare Other | Source: Ambulatory Visit

## 2014-12-17 ENCOUNTER — Other Ambulatory Visit: Payer: Self-pay

## 2014-12-17 DIAGNOSIS — H2512 Age-related nuclear cataract, left eye: Secondary | ICD-10-CM | POA: Diagnosis not present

## 2014-12-17 DIAGNOSIS — G4730 Sleep apnea, unspecified: Secondary | ICD-10-CM | POA: Diagnosis not present

## 2014-12-17 DIAGNOSIS — Z96659 Presence of unspecified artificial knee joint: Secondary | ICD-10-CM | POA: Diagnosis not present

## 2014-12-17 DIAGNOSIS — Z79899 Other long term (current) drug therapy: Secondary | ICD-10-CM | POA: Diagnosis not present

## 2014-12-17 DIAGNOSIS — H2513 Age-related nuclear cataract, bilateral: Secondary | ICD-10-CM | POA: Diagnosis not present

## 2014-12-17 DIAGNOSIS — Z96649 Presence of unspecified artificial hip joint: Secondary | ICD-10-CM | POA: Diagnosis not present

## 2014-12-17 DIAGNOSIS — I10 Essential (primary) hypertension: Secondary | ICD-10-CM | POA: Diagnosis not present

## 2014-12-19 DIAGNOSIS — I10 Essential (primary) hypertension: Secondary | ICD-10-CM | POA: Diagnosis not present

## 2014-12-19 DIAGNOSIS — H2512 Age-related nuclear cataract, left eye: Secondary | ICD-10-CM | POA: Diagnosis not present

## 2014-12-19 DIAGNOSIS — Z96659 Presence of unspecified artificial knee joint: Secondary | ICD-10-CM | POA: Diagnosis not present

## 2014-12-19 DIAGNOSIS — Z96649 Presence of unspecified artificial hip joint: Secondary | ICD-10-CM | POA: Diagnosis not present

## 2014-12-19 DIAGNOSIS — G4730 Sleep apnea, unspecified: Secondary | ICD-10-CM | POA: Diagnosis not present

## 2014-12-19 DIAGNOSIS — Z79899 Other long term (current) drug therapy: Secondary | ICD-10-CM | POA: Diagnosis not present

## 2014-12-20 ENCOUNTER — Encounter: Payer: Self-pay | Admitting: Family Medicine

## 2014-12-23 DIAGNOSIS — M4806 Spinal stenosis, lumbar region: Secondary | ICD-10-CM | POA: Diagnosis not present

## 2014-12-23 DIAGNOSIS — M545 Low back pain: Secondary | ICD-10-CM | POA: Diagnosis not present

## 2014-12-24 ENCOUNTER — Ambulatory Visit
Admission: RE | Admit: 2014-12-24 | Discharge: 2014-12-24 | Disposition: A | Discharge status: Home / Self Care | Payer: Medicare Other | Source: Ambulatory Visit | Attending: Ophthalmology | Admitting: Ophthalmology

## 2014-12-24 ENCOUNTER — Ambulatory Visit: Payer: Medicare Other | Admitting: Anesthesiology

## 2014-12-24 ENCOUNTER — Encounter
Admission: RE | Disposition: A | Discharge status: Home / Self Care | Payer: Self-pay | Source: Ambulatory Visit | Attending: Ophthalmology

## 2014-12-24 ENCOUNTER — Encounter: Payer: Self-pay | Admitting: *Deleted

## 2014-12-24 DIAGNOSIS — Z96649 Presence of unspecified artificial hip joint: Secondary | ICD-10-CM | POA: Diagnosis not present

## 2014-12-24 DIAGNOSIS — Z79899 Other long term (current) drug therapy: Secondary | ICD-10-CM | POA: Diagnosis not present

## 2014-12-24 DIAGNOSIS — Z96659 Presence of unspecified artificial knee joint: Secondary | ICD-10-CM | POA: Diagnosis not present

## 2014-12-24 DIAGNOSIS — G4730 Sleep apnea, unspecified: Secondary | ICD-10-CM | POA: Diagnosis not present

## 2014-12-24 DIAGNOSIS — I10 Essential (primary) hypertension: Secondary | ICD-10-CM | POA: Insufficient documentation

## 2014-12-24 DIAGNOSIS — H2512 Age-related nuclear cataract, left eye: Secondary | ICD-10-CM | POA: Insufficient documentation

## 2014-12-24 HISTORY — PX: CATARACT EXTRACTION W/PHACO: SHX586

## 2014-12-24 SURGERY — PHACOEMULSIFICATION, CATARACT, WITH IOL INSERTION
Anesthesia: Monitor Anesthesia Care | Laterality: Left

## 2014-12-24 MED ORDER — MOXIFLOXACIN HCL 0.5 % OP SOLN - NO CHARGE
OPHTHALMIC | Status: DC | PRN
  Administered 2014-12-24: 1 [drp] via OPHTHALMIC

## 2014-12-24 MED ORDER — EPINEPHRINE HCL 1 MG/ML IJ SOLN
INTRAMUSCULAR | Status: AC
  Filled 2014-12-24: qty 1

## 2014-12-24 MED ORDER — EPINEPHRINE HCL 1 MG/ML IJ SOLN
INTRAOCULAR | Status: DC | PRN
  Administered 2014-12-24: 100 mL

## 2014-12-24 MED ORDER — CEFUROXIME OPHTHALMIC INJECTION 1 MG/0.1 ML
INJECTION | OPHTHALMIC | Status: AC
  Filled 2014-12-24: qty 0.1

## 2014-12-24 MED ORDER — POVIDONE-IODINE 5 % OP SOLN
1.0000 "application " | OPHTHALMIC | Status: AC | PRN
  Administered 2014-12-24: 1 via OPHTHALMIC

## 2014-12-24 MED ORDER — CARBACHOL 0.01 % IO SOLN
INTRAOCULAR | Status: DC | PRN
  Administered 2014-12-24: 0.5 mL via INTRAOCULAR

## 2014-12-24 MED ORDER — CEFUROXIME OPHTHALMIC INJECTION 1 MG/0.1 ML
INJECTION | OPHTHALMIC | Status: DC | PRN
  Administered 2014-12-24: 0.1 mL via INTRACAMERAL

## 2014-12-24 MED ORDER — FENTANYL CITRATE (PF) 100 MCG/2ML IJ SOLN
INTRAMUSCULAR | Status: DC | PRN
Start: 2014-12-24 — End: 2014-12-24
  Administered 2014-12-24: 50 ug via INTRAVENOUS

## 2014-12-24 MED ORDER — SODIUM CHLORIDE 0.9 % IV SOLN
INTRAVENOUS | Status: DC
  Administered 2014-12-24 (×2): via INTRAVENOUS

## 2014-12-24 MED ORDER — NA CHONDROIT SULF-NA HYALURON 40-17 MG/ML IO SOLN
INTRAOCULAR | Status: AC
  Filled 2014-12-24: qty 1

## 2014-12-24 MED ORDER — MIDAZOLAM HCL 2 MG/2ML IJ SOLN
INTRAMUSCULAR | Status: DC | PRN
  Administered 2014-12-24: 1 mg via INTRAVENOUS

## 2014-12-24 MED ORDER — MOXIFLOXACIN HCL 0.5 % OP SOLN: 1.0000 [drp] | OPHTHALMIC | Status: DC | PRN

## 2014-12-24 MED ORDER — ARMC OPHTHALMIC DILATING GEL
1.0000 "application " | OPHTHALMIC | Status: DC | PRN
  Administered 2014-12-24: 1 via OPHTHALMIC

## 2014-12-24 MED ORDER — TETRACAINE HCL 0.5 % OP SOLN
1.0000 [drp] | OPHTHALMIC | Status: AC | PRN
  Administered 2014-12-24: 1 [drp] via OPHTHALMIC

## 2014-12-24 SURGICAL SUPPLY — 25 items
CANNULA ANT/CHMB 27G (MISCELLANEOUS) ×1 IMPLANT
CANNULA ANT/CHMB 27GA (MISCELLANEOUS) ×2 IMPLANT
CENTURION VISION SYSTEM #8065752180 ×1 IMPLANT
GLOVE BIO SURGEON STRL SZ8 (GLOVE) ×2 IMPLANT
GLOVE BIOGEL M 6.5 STRL (GLOVE) ×2 IMPLANT
GLOVE SURG LX 8.0 MICRO (GLOVE) ×1
GLOVE SURG LX STRL 8.0 MICRO (GLOVE) ×1 IMPLANT
GOWN STRL REUS W/ TWL LRG LVL3 (GOWN DISPOSABLE) ×2 IMPLANT
GOWN STRL REUS W/TWL LRG LVL3 (GOWN DISPOSABLE) ×4
LENS IOL TECNIS TRC I 300 15.5 (Intraocular Lens) IMPLANT
LENS IOL TORIC 15.5 (Intraocular Lens) ×2 IMPLANT
LENS IOL TORIC 300 15.5 (Intraocular Lens) ×1 IMPLANT
PACK CATARACT (MISCELLANEOUS) ×2 IMPLANT
PACK CATARACT BRASINGTON LX (MISCELLANEOUS) ×2 IMPLANT
PACK EYE AFTER SURG (MISCELLANEOUS) ×2 IMPLANT
SOL BSS BAG (MISCELLANEOUS) ×2
SOL PREP PVP 2OZ (MISCELLANEOUS) ×2
SOLUTION BSS BAG (MISCELLANEOUS) ×1 IMPLANT
SOLUTION PREP PVP 2OZ (MISCELLANEOUS) ×1 IMPLANT
SYR 5ML LL (SYRINGE) ×2 IMPLANT
SYR TB 1ML 27GX1/2 LL (SYRINGE) ×2 IMPLANT
WATER STERILE IRR 1000ML POUR (IV SOLUTION) ×2 IMPLANT
WIPE NON LINTING 3.25X3.25 (MISCELLANEOUS) ×2 IMPLANT
ZCT300 15.5 ×1 IMPLANT
zct300 15.5 ×1 IMPLANT

## 2014-12-24 NOTE — Discharge Instructions (Addendum)
See cataract post op handout  Eye Surgery Discharge Instructions  Expect mild scratchy sensation or mild soreness. DO NOT RUB YOUR EYE!  The day of surgery:  Minimal physical activity, but bed rest is not required  No reading, computer work, or close hand work  No bending, lifting, or straining.  May watch TV  For 24 hours:  No driving, legal decisions, or alcoholic beverages  Safety precautions  Eat anything you prefer: It is better to start with liquids, then soup then solid foods.  _____ Eye patch should be worn until postoperative exam tomorrow.  ____ Solar shield eyeglasses should be worn for comfort in the sunlight/patch while sleeping  Resume all regular medications including aspirin or Coumadin if these were discontinued prior to surgery. You may shower, bathe, shave, or wash your hair. Tylenol may be taken for mild discomfort.  Call your doctor if you experience significant pain, nausea, or vomiting, fever > 101 or other signs of infection. [phone] or [phone] Specific instructions:  Follow-up Information    Follow up with Tim Lair, MD On 12/25/2014.   Specialty:  Ophthalmology   Why:  at 10:45   Contact information:   [address] [place] [address] [phone]

## 2014-12-24 NOTE — Transfer of Care (Signed)
Immediate Anesthesia Transfer of Care Note  Patient: Ronald Bullock.  Procedure(s) Performed: Procedure(s) with comments: CATARACT EXTRACTION PHACO AND INTRAOCULAR LENS PLACEMENT (IOC) (Left) - [place] 01:16 AP% 26.4 Cde 20.17   Patient Location: Short Stay  Anesthesia Type:MAC  Level of Consciousness: awake, alert  and oriented  Airway & Oxygen Therapy: Patient Spontanous Breathing  Post-op Assessment: Report given to RN and Post -op Vital signs reviewed and stable  Post vital signs: Reviewed and stable  Last Vitals:  Filed Vitals:   12/24/14 1151  BP: 142/71  Pulse: 58  Temp: 37 C  Resp: 16    Complications: No apparent anesthesia complications

## 2014-12-24 NOTE — Anesthesia Preprocedure Evaluation (Signed)
Anesthesia Evaluation  Patient identified by MRN, date of birth, ID band Patient awake    Reviewed: Allergy & Precautions, NPO status , Patient's Chart, lab work & pertinent test results  History of Anesthesia Complications Negative for: history of anesthetic complications  Airway Mallampati: II  TM Distance: >3 FB Neck ROM: Full    Dental no notable dental hx.    Pulmonary sleep apnea and Continuous Positive Airway Pressure Ventilation ,  breath sounds clear to auscultation  Pulmonary exam normal       Cardiovascular hypertension, Pt. on medications Normal cardiovascular examRhythm:Regular Rate:Normal     Neuro/Psych negative neurological ROS  negative psych ROS   GI/Hepatic negative GI ROS, Neg liver ROS,   Endo/Other  negative endocrine ROS  Renal/GU negative Renal ROS  negative genitourinary   Musculoskeletal  (+) Arthritis -, Osteoarthritis,    Abdominal   Peds negative pediatric ROS (+)  Hematology negative hematology ROS (+)   Anesthesia Other Findings   Reproductive/Obstetrics negative OB ROS                             Anesthesia Physical Anesthesia Plan  ASA: III  Anesthesia Plan: MAC   Post-op Pain Management:    Induction: Intravenous  Airway Management Planned: Nasal Cannula  Additional Equipment:   Intra-op Plan:   Post-operative Plan:   Informed Consent: I have reviewed the patients History and Physical, chart, labs and discussed the procedure including the risks, benefits and alternatives for the proposed anesthesia with the patient or authorized representative who has indicated his/her understanding and acceptance.     Plan Discussed with: CRNA and Surgeon  Anesthesia Plan Comments:         Anesthesia Quick Evaluation

## 2014-12-24 NOTE — Anesthesia Postprocedure Evaluation (Signed)
  Anesthesia Post-op Note  Patient: Ronald Bullock.  Procedure(s) Performed: Procedure(s) with comments: CATARACT EXTRACTION PHACO AND INTRAOCULAR LENS PLACEMENT (IOC) (Left) - [place] 01:16 AP% 26.4 Cde 20.17  Anesthesia type:MAC  Patient location: Short Stay  Post pain: Pain level controlled  Post assessment: Post-op Vital signs reviewed, Patient's Cardiovascular Status Stable, Respiratory Function Stable, Patent Airway and No signs of Nausea or vomiting  Post vital signs: Reviewed and stable  Last Vitals:  Filed Vitals:   12/24/14 1151  BP: 142/71  Pulse: 58  Temp: 37 C  Resp: 16    Level of consciousness: awake, alert  and patient cooperative  Complications: No apparent anesthesia complications

## 2014-12-24 NOTE — H&P (Signed)
  All labs reviewed. Abnormal studies sent to patients PCP when indicated.  Previous H&P reviewed, patient examined, there are NO CHANGES.  

## 2014-12-24 NOTE — Op Note (Addendum)
PREOPERATIVE DIAGNOSIS:  Nuclear sclerotic cataract of the left eye.   POSTOPERATIVE DIAGNOSIS:  same   OPERATIVE PROCEDURE:  Cataract extraction by phacoemulsification with implant of intraocular lens to right eye.   SURGEON:  Birder Robson, MD.   ANESTHESIA: 1.      Managed anesthesia care. 2.      Topical tetracaine drops followed by 2% Xylocaine jelly applied in the preoperative holding area.   COMPLICATIONS:  None.   TECHNIQUE:   Stop and chop    DESCRIPTION OF PROCEDURE:  The patient was examined and consented in the preoperative holding area where the aforementioned topical anesthesia was applied to the left eye.  The patient was brought back to the Operating Room where he was sat upright on the gurney and given a target to fixate upon while the eye was marked at the 3:00 and 9:00 position.  The patient was then reclined on the operating table.  The eye was prepped and draped in the usual sterile ophthalmic fashion and a lid speculum was placed. A paracentesis was created with the side port blade and the anterior chamber was filled with viscoelastic. A near clear corneal incision was performed with the steel keratome. A continuous curvilinear capsulorrhexis was performed with a cystotome followed by the capsulorrhexis forceps. Hydrodissection and hydrodelineation were carried out with BSS on a blunt cannula. The lens was removed in a stop and chop technique and the remaining cortical material was removed with the irrigation-aspiration handpiece. The eye was inflated with viscoelastic and the ZCT lens  was placed in the eye and rotated to within a few degrees of the predetermined orientation.  The remaining viscoelastic was removed from the eye.  The Sins[place] hook was used to rotate the toric lens into its final resting place at 095 degrees.  0.1 mL of cefuroxime concentration 10 mg/mL was placed in the anterior chamber. The eye was inflated to a physiologic pressure and found to be  watertight.  The eye was dressed with Vigamox. The patient was given protective glasses to wear throughout the day and a shield with which to sleep tonight. The patient was also given drops with which to begin a drop regimen today and will follow-up with me in one day.  Implant Name Type Inv. Item Serial No. Manufacturer Lot No. LRB No. Used  [id number] 15.5     [id number]     Left 1     Electronically signed: Ennio Houp,Emeric LOUIS 12/24/2014 11:49 AM

## 2014-12-25 ENCOUNTER — Encounter: Payer: Self-pay | Admitting: Family Medicine

## 2014-12-29 ENCOUNTER — Encounter: Payer: Self-pay | Admitting: Ophthalmology

## 2015-01-03 DIAGNOSIS — M545 Low back pain: Secondary | ICD-10-CM | POA: Diagnosis not present

## 2015-01-03 DIAGNOSIS — M4806 Spinal stenosis, lumbar region: Secondary | ICD-10-CM | POA: Diagnosis not present

## 2015-01-06 DIAGNOSIS — M4806 Spinal stenosis, lumbar region: Secondary | ICD-10-CM | POA: Diagnosis not present

## 2015-01-06 DIAGNOSIS — M545 Low back pain: Secondary | ICD-10-CM | POA: Diagnosis not present

## 2015-01-10 DIAGNOSIS — M4806 Spinal stenosis, lumbar region: Secondary | ICD-10-CM | POA: Diagnosis not present

## 2015-01-10 DIAGNOSIS — M545 Low back pain: Secondary | ICD-10-CM | POA: Diagnosis not present

## 2015-01-21 DIAGNOSIS — M4806 Spinal stenosis, lumbar region: Secondary | ICD-10-CM | POA: Diagnosis not present

## 2015-01-24 ENCOUNTER — Other Ambulatory Visit: Payer: Self-pay | Admitting: Family Medicine

## 2015-01-27 DIAGNOSIS — M545 Low back pain: Secondary | ICD-10-CM | POA: Diagnosis not present

## 2015-01-27 DIAGNOSIS — M4806 Spinal stenosis, lumbar region: Secondary | ICD-10-CM | POA: Diagnosis not present

## 2015-01-31 DIAGNOSIS — M545 Low back pain: Secondary | ICD-10-CM | POA: Diagnosis not present

## 2015-01-31 DIAGNOSIS — M4806 Spinal stenosis, lumbar region: Secondary | ICD-10-CM | POA: Diagnosis not present

## 2015-02-03 DIAGNOSIS — M4806 Spinal stenosis, lumbar region: Secondary | ICD-10-CM | POA: Diagnosis not present

## 2015-02-03 DIAGNOSIS — M545 Low back pain: Secondary | ICD-10-CM | POA: Diagnosis not present

## 2015-02-07 DIAGNOSIS — M545 Low back pain: Secondary | ICD-10-CM | POA: Diagnosis not present

## 2015-02-10 DIAGNOSIS — M545 Low back pain: Secondary | ICD-10-CM | POA: Diagnosis not present

## 2015-02-10 DIAGNOSIS — M4806 Spinal stenosis, lumbar region: Secondary | ICD-10-CM | POA: Diagnosis not present

## 2015-02-24 ENCOUNTER — Encounter: Payer: Self-pay | Admitting: Ophthalmology

## 2015-06-16 ENCOUNTER — Encounter: Payer: Self-pay | Admitting: Family Medicine

## 2015-06-16 ENCOUNTER — Other Ambulatory Visit: Payer: Self-pay | Admitting: Family Medicine

## 2015-06-16 ENCOUNTER — Telehealth: Payer: Self-pay

## 2015-06-16 ENCOUNTER — Ambulatory Visit (INDEPENDENT_AMBULATORY_CARE_PROVIDER_SITE_OTHER): Payer: Medicare Other | Admitting: Family Medicine

## 2015-06-16 VITALS — BP 130/80 | HR 52 | Temp 98.1°F | Wt 247.8 lb

## 2015-06-16 DIAGNOSIS — M1A09X1 Idiopathic chronic gout, multiple sites, with tophus (tophi): Secondary | ICD-10-CM

## 2015-06-16 DIAGNOSIS — Z114 Encounter for screening for human immunodeficiency virus [HIV]: Secondary | ICD-10-CM

## 2015-06-16 DIAGNOSIS — E785 Hyperlipidemia, unspecified: Secondary | ICD-10-CM

## 2015-06-16 DIAGNOSIS — I499 Cardiac arrhythmia, unspecified: Secondary | ICD-10-CM

## 2015-06-16 DIAGNOSIS — I10 Essential (primary) hypertension: Secondary | ICD-10-CM

## 2015-06-16 DIAGNOSIS — R7309 Other abnormal glucose: Secondary | ICD-10-CM | POA: Diagnosis not present

## 2015-06-16 DIAGNOSIS — Z1159 Encounter for screening for other viral diseases: Secondary | ICD-10-CM

## 2015-06-16 DIAGNOSIS — E669 Obesity, unspecified: Secondary | ICD-10-CM

## 2015-06-16 LAB — RENAL FUNCTION PANEL
Albumin: 4 g/dL (ref 3.5–5.2)
BUN: 21 mg/dL (ref 6–23)
CHLORIDE: 104 meq/L (ref 96–112)
CO2: 26 meq/L (ref 19–32)
CREATININE: 0.99 mg/dL (ref 0.40–1.50)
Calcium: 9.1 mg/dL (ref 8.4–10.5)
GFR: 80.41 mL/min (ref >60.00)
Glucose, Bld: 111 mg/dL — ABNORMAL HIGH (ref 70–99)
PHOSPHORUS: 3.4 mg/dL (ref 2.3–4.6)
Potassium: 4.4 mEq/L (ref 3.5–5.1)
Sodium: 140 mEq/L (ref 135–145)

## 2015-06-16 LAB — LIPID PANEL
CHOLESTEROL: 149 mg/dL (ref 0–200)
HDL: 29.6 mg/dL — AB (ref >39.00)
LDL Cholesterol: 84 mg/dL (ref 0–99)
NonHDL: 119.68
Total CHOL/HDL Ratio: 5
Triglycerides: 177 mg/dL — ABNORMAL HIGH (ref 0.0–149.0)
VLDL: 35.4 mg/dL (ref 0.0–40.0)

## 2015-06-16 LAB — HEMOGLOBIN A1C: HEMOGLOBIN A1C: 5.9 % (ref 4.6–6.5)

## 2015-06-16 LAB — URIC ACID: URIC ACID, SERUM: 6.1 mg/dL (ref 4.0–7.8)

## 2015-06-16 MED ORDER — MELOXICAM 7.5 MG PO TABS: 7.5000 mg | ORAL_TABLET | Freq: Every day | ORAL | Status: DC

## 2015-06-16 MED ORDER — LISINOPRIL 10 MG PO TABS: 10.0000 mg | ORAL_TABLET | Freq: Every day | ORAL | Status: DC

## 2015-06-16 MED ORDER — ALLOPURINOL 300 MG PO TABS: 300.0000 mg | ORAL_TABLET | Freq: Every day | ORAL | Status: DC

## 2015-06-16 NOTE — Assessment & Plan Note (Signed)
Chronic, stable. Continue current regimen. 

## 2015-06-16 NOTE — Progress Notes (Signed)
Pre visit review using our clinic review tool, if applicable. No additional management support is needed unless otherwise documented below in the visit note. 

## 2015-06-16 NOTE — Telephone Encounter (Signed)
Pt left v/m; pt was seen earlier today and forgot to ask Dr Darnell Level why any thing with seasoning causing tongue to have burning sensation; pt request cb.[facility]

## 2015-06-16 NOTE — Assessment & Plan Note (Signed)
Encouraged weight loss 

## 2015-06-16 NOTE — Assessment & Plan Note (Addendum)
Check A1c. 

## 2015-06-16 NOTE — Assessment & Plan Note (Signed)
Off fish oil. Check FLP today.

## 2015-06-16 NOTE — Assessment & Plan Note (Signed)
Great control on allopurinol 300mg  daily. Continue. Refilled today. Check uric acid today.

## 2015-06-16 NOTE — Patient Instructions (Addendum)
Labwork today Think about pneumonia shot.  Return as needed or in 6-8 months for medicare wellness visit.

## 2015-06-16 NOTE — Progress Notes (Signed)
BP 130/80 mmHg  Pulse 52  Temp(Src) 98.1 F (36.7 C) (Oral)  Wt 247 lb 12 oz (112.379 kg)   CC: med refill visit  Subjective:    Patient ID: Ronald Bullock., male    DOB: 1951-02-06, 64 y.o.   MRN: [id number]  HPI: Hanz Winterhalter. is a 64 y.o. male presenting on 06/16/2015 for Medication Refill; Arthritis; and Bleeding/Bruising   Received medicare this year.   HTN - Compliant with current antihypertensive regimen of lisinopril 10mg  daily. Does check blood pressures at home: 115-130/65-80s. No low blood pressure readings or symptoms of dizziness/syncope. Denies HA, vision changes, CP/tightness, SOB. + occasional dependent leg swelling at ankles.   Gout - very well controlled on allopurinol 300mg  daily.    Noted bruise under nails left thumb and ring finger. Doesn't remember injury. Denies other bruising or easy bleeding.   S/p R hip and L knee replacements. Known spinal stenosis that really bothers him. Takes meloxicam and prn tylenol.  Relevant past medical, surgical, family and social history reviewed and updated as indicated. Interim medical history since our last visit reviewed. Allergies and medications reviewed and updated. Current Outpatient Prescriptions on File Prior to Visit  Medication Sig  . colchicine 0.6 MG tablet Take 2 initially, then one twice daily for gout. (Patient not taking: Reported on 06/16/2015)  . Omega 3-6-9 Fatty Acids (OMEGA-3-6-9 PO) Take 2 tablets by mouth daily with supper.   No current facility-administered medications on file prior to visit.    Review of Systems Per HPI unless specifically indicated in ROS section     Objective:    BP 130/80 mmHg  Pulse 52  Temp(Src) 98.1 F (36.7 C) (Oral)  Wt 247 lb 12 oz (112.379 kg)  Wt Readings from Last 3 Encounters:  06/16/15 247 lb 12 oz (112.379 kg)  12/24/14 244 lb (110.678 kg)  12/17/14 244 lb (110.678 kg)   Body mass index is 32.69 kg/(m^2).  Physical Exam    Constitutional: He appears well-developed and well-nourished. No distress.  Cardiovascular: Normal rate, regular rhythm, normal heart sounds and intact distal pulses.   No murmur heard. Pulmonary/Chest: Effort normal and breath sounds normal. No respiratory distress. He has no wheezes. He has no rales.  Musculoskeletal: He exhibits no edema.  Nursing note and vitals reviewed.  Results for orders placed or performed in visit on 11/04/14  Uric acid  Result Value Ref Range   Uric Acid, Serum 6.3 4.0 - 7.8 mg/dL  Renal function panel  Result Value Ref Range   Sodium 136 135 - 145 mEq/L   Potassium 4.5 3.5 - 5.1 mEq/L   Chloride 102 96 - 112 mEq/L   CO2 27 19 - 32 mEq/L   Calcium 9.2 8.4 - 10.5 mg/dL   Albumin 4.1 3.5 - 5.2 g/dL   BUN 24 (H) 6 - 23 mg/dL   Creatinine, Ser 1.22 0.40 - 1.50 mg/dL   Glucose, Bld 120 (H) 70 - 99 mg/dL   Phosphorus 3.7 2.3 - 4.6 mg/dL   GFR 63.30 >60.00 mL/min      Assessment & Plan:  EKG checked for irregular heartbeat - stable. EKG - NSR rate 60s, mild LAD, normal intervals, no acute ST/T changes Problem List Items Addressed This Visit    Obesity, Class I, BMI 30-34.9    Encouraged weight loss.      HYPERGLYCEMIA    Check A1c      Relevant Orders   Hemoglobin A1c  HLD (hyperlipidemia)    Off fish oil. Check FLP today.      Relevant Medications   lisinopril (PRINIVIL,ZESTRIL) 10 MG tablet   Other Relevant Orders   Lipid panel   Gout    Great control on allopurinol 300mg  daily. Continue. Refilled today. Check uric acid today.      Relevant Orders   Uric acid   Essential hypertension - Primary    Chronic, stable. Continue current regimen.      Relevant Medications   lisinopril (PRINIVIL,ZESTRIL) 10 MG tablet   Other Relevant Orders   Renal function panel    Other Visit Diagnoses    Irregular heart beat        Relevant Orders    EKG 12-Lead (Completed)    Need for hepatitis C screening test        Relevant Orders     Hepatitis C antibody, reflex    Screening for HIV (human immunodeficiency virus)            Follow up plan: Return in about 6 months (around 12/14/2015), or as needed, for medicare wellness visit.

## 2015-06-16 NOTE — Addendum Note (Signed)
Addended by: [name] on: 06/16/2015 10:08 AM   Modules accepted: Orders

## 2015-06-17 LAB — HEPATITIS C ANTIBODY: HCV Ab: NEGATIVE

## 2015-06-17 LAB — HIV ANTIBODY (ROUTINE TESTING W REFLEX): HIV: NONREACTIVE

## 2015-06-17 NOTE — Telephone Encounter (Signed)
Any oral lesions associated with burning tongue and any other parts of mouth burning or just tongue? This could be due to several different causes - one possible cause could be higher allopurinol dose.  Would start with avoiding spicy foods and ensure no new spots in mouth, ensure gets plenty of water and could try biotene oral rinse.

## 2015-06-18 NOTE — Telephone Encounter (Signed)
I notified patient of these results. He states that it is only his tongue that has the burning sensation.  He states that he has tried using the Biotene oral rinse and that does not help either. Patient states that even if he eats plain non-spicy foods, his tongue still burns.

## 2015-06-20 NOTE — Telephone Encounter (Addendum)
How long has it been going on? Does it correlate to higher allopurinol dose? Would watch for now, may add some labs at next blood work (B6, B12, iron, folate, zinc,TSH, ANA/anti-RNP for MCTD) Med can be tried (TCA, gabapentin, pramipexole)

## 2015-06-20 NOTE — Telephone Encounter (Signed)
Spoke with patient. He said it has been going on since his surgery in 2014. He said it has progressively gotten worse since then and seems to have increased in intensity since increasing the allopurinol as well. It is just his tongue and he can never predict what is going to trigger it. The food doesn't even have to be spicy. No oral lesions. He is using biotene spray, rinse and toothpaste. He is agreeable to labs if needed.

## 2015-06-23 ENCOUNTER — Encounter: Payer: Self-pay | Admitting: *Deleted

## 2015-06-29 NOTE — Telephone Encounter (Signed)
Don't think allopurinol related. Let's just get labs at next visit when he returns for medicare wellness visit if persistent.  In interim may have him start a zinc supplement daily for next 1-2 months to see if any improvement.  How is sense of smell?

## 2015-07-02 NOTE — Telephone Encounter (Signed)
Patient notified. He said he's never had much of a sense of smell due to his deviated septum. He will try the zinc.

## 2015-07-21 ENCOUNTER — Other Ambulatory Visit: Payer: Self-pay | Admitting: Family Medicine

## 2015-07-28 DIAGNOSIS — H2511 Age-related nuclear cataract, right eye: Secondary | ICD-10-CM | POA: Diagnosis not present

## 2015-12-10 ENCOUNTER — Other Ambulatory Visit: Payer: Self-pay | Admitting: Family Medicine

## 2016-02-28 DIAGNOSIS — Z96652 Presence of left artificial knee joint: Secondary | ICD-10-CM | POA: Diagnosis not present

## 2016-02-28 DIAGNOSIS — M4806 Spinal stenosis, lumbar region: Secondary | ICD-10-CM | POA: Diagnosis not present

## 2016-02-28 DIAGNOSIS — Z09 Encounter for follow-up examination after completed treatment for conditions other than malignant neoplasm: Secondary | ICD-10-CM | POA: Diagnosis not present

## 2016-04-02 ENCOUNTER — Ambulatory Visit (INDEPENDENT_AMBULATORY_CARE_PROVIDER_SITE_OTHER): Payer: Medicare Other | Admitting: Family Medicine

## 2016-04-02 ENCOUNTER — Encounter: Payer: Self-pay | Admitting: Family Medicine

## 2016-04-02 VITALS — BP 132/74 | HR 80 | Temp 98.5°F | Wt 246.5 lb

## 2016-04-02 DIAGNOSIS — M1A09X1 Idiopathic chronic gout, multiple sites, with tophus (tophi): Secondary | ICD-10-CM | POA: Diagnosis not present

## 2016-04-02 DIAGNOSIS — K146 Glossodynia: Secondary | ICD-10-CM | POA: Diagnosis not present

## 2016-04-02 DIAGNOSIS — R432 Parageusia: Secondary | ICD-10-CM | POA: Diagnosis not present

## 2016-04-02 DIAGNOSIS — D509 Iron deficiency anemia, unspecified: Secondary | ICD-10-CM | POA: Diagnosis not present

## 2016-04-02 DIAGNOSIS — E785 Hyperlipidemia, unspecified: Secondary | ICD-10-CM

## 2016-04-02 LAB — IRON AND TIBC
%SAT: 22 % (ref 15–60)
Iron: 49 ug/dL — ABNORMAL LOW (ref 50–180)
TIBC: 223 ug/dL — AB (ref 250–425)
UIBC: 174 ug/dL (ref 125–400)

## 2016-04-02 LAB — FOLATE: Folate: 12.5 ng/mL (ref >5.4)

## 2016-04-02 LAB — URIC ACID: URIC ACID, SERUM: 6.3 mg/dL (ref 4.0–8.0)

## 2016-04-02 LAB — VITAMIN B12: VITAMIN B 12: 308 pg/mL (ref 200–1100)

## 2016-04-02 LAB — TSH: TSH: 2.11 m[IU]/L (ref 0.40–4.50)

## 2016-04-02 NOTE — Assessment & Plan Note (Addendum)
Ongoing since at least 05/2016 (see phone note) but patient endorses worse since initial ortho surgeries 2014.  No significant improvement with tooth paste, biotene or zinc supplements. Will check for laboratory evidence of taste disorders (check vitamins and autoimmune studies, check iron pane and thyroid).  Allopurinol does have ageusis/dysgeusis listed as possible side effects so will see if we can slowly taper down on medication for symptomatic improvement.  Discussed ENT referral if labwork unrevealing vs trial of medication (TCA, gabapentin, klonopin).

## 2016-04-02 NOTE — Patient Instructions (Addendum)
Labs today including uric acid.  We have the option of increasing meloxicam to 15mg  daily - let us know if you'd like this.  Good to see you today, we will be in touch with results.

## 2016-04-02 NOTE — Assessment & Plan Note (Signed)
Chronic, stable on allopurinol 300mg  daily. Check uric acid level. See below.

## 2016-04-02 NOTE — Progress Notes (Signed)
BP 132/74   Pulse 80   Temp 98.5 F (36.9 C) (Oral)   Wt 246 lb 8 oz (111.8 kg)   BMI 32.52 kg/m    CC: tongue burning Subjective:    Patient ID: Ronald Bullock., male    DOB: 04/24/1951, 65 y.o.   MRN: [id number]  HPI: Ronald Bullock. is a 65 y.o. male presenting on 04/02/2016 for Tongue burning (everything he eats burns his tongue-even bread) and Medication Management (allopurinol, meloxicam)   Ongoing tongue burning over last 10+ months. Unsure if zinc supplement was helpful. Also tried biotene spray, rinse, toothpaste. Ongoing since surgery 2014. Possibly worse with allopurinol.   First noticed with spicy [place] food.  Now any foods with seasoning can cause burning.  Describes burning pain at tip of tongue.  Tongue feels rough, now lately any food "feels like sandpaper going over tongue."   Would watch for now, may add some labs at next blood work (B6, B12, iron, folate, zinc,TSH, ANA/anti-RNP for MCTD) Med can be tried (TCA, gabapentin, pramipexole)  Ongoing back pain - known spinal stenosis and herniated disc. Trying to postpone surgery as long as he can. Has been taking meloxicam 7.5mg , asks about increased dose to 15mg .   Relevant past medical, surgical, family and social history reviewed and updated as indicated. Interim medical history since our last visit reviewed. Allergies and medications reviewed and updated. Current Outpatient Prescriptions on File Prior to Visit  Medication Sig  . allopurinol (ZYLOPRIM) 300 MG tablet Take 1 tablet (300 mg total) by mouth daily.  Marland Kitchen lisinopril (PRINIVIL,ZESTRIL) 10 MG tablet Take 1 tablet (10 mg total) by mouth daily.  . meloxicam (MOBIC) 7.5 MG tablet TAKE 1 TABLET (7.5 MG TOTAL) BY MOUTH DAILY. AS NEEDED  . Omega 3-6-9 Fatty Acids (OMEGA-3-6-9 PO) Take 2 tablets by mouth daily with supper.   No current facility-administered medications on file prior to visit.    Past Surgical History:  Procedure Laterality  Date  . cardiolyte  1997   WNL  . carotid US  1997   WNL  . CATARACT EXTRACTION W/PHACO Left 12/24/2014   Procedure: CATARACT EXTRACTION PHACO AND INTRAOCULAR LENS PLACEMENT (IOC);  Surgeon: Birder Robson, MD; [place] 01:16AP% 26.4Cde 20.17  . COLONOSCOPY  1998   WNL  . COLONOSCOPY  06/2010   WNL, internal hemorrhoids, rec rpt 10 yrs  . CT HEAD LIMITED W/CM  1997   WNL  . ESOPHAGOGASTRODUODENOSCOPY  1998   gastric polyps, benign  . JOINT REPLACEMENT  1968   Left Knee, cartilage removed  . KNEE ARTHROSCOPY  1968   cartilage removal  . MRI lumbar  2000   mild bulge L3/4, mild foraminal narrowing L4/5, L5/S2, small disk herniation  . MRI lumbar  11/2014   severe L3/4, L4/5 central canal stenosis ([place])  . TOTAL HIP ARTHROPLASTY Right 03/27/2013   TOTAL HIP ARTHROPLASTY ANTERIOR APPROACH;  Surgeon: Hessie Dibble, MD  . TOTAL KNEE ARTHROPLASTY Left 09/26/2012   TOTAL KNEE ARTHROPLASTY;  Surgeon: Hessie Dibble, MD    Review of Systems Per HPI unless specifically indicated in ROS section     Objective:    BP 132/74   Pulse 80   Temp 98.5 F (36.9 C) (Oral)   Wt 246 lb 8 oz (111.8 kg)   BMI 32.52 kg/m   Wt Readings from Last 3 Encounters:  04/02/16 246 lb 8 oz (111.8 kg)  06/16/15 247 lb 12 oz (112.4 kg)  12/24/14 244  lb (110.7 kg)    Physical Exam  Constitutional: He appears well-developed and well-nourished. No distress.  HENT:  Head: Normocephalic.  Mouth/Throat: Uvula is midline, oropharynx is clear and moist and mucous membranes are normal. No oropharyngeal exudate, posterior oropharyngeal edema, posterior oropharyngeal erythema or tonsillar abscesses.  No obvious lesions on tongue No tongue swelling or smoothness appreciated No oral lesions  Eyes: Conjunctivae and EOM are normal. Pupils are equal, round, and reactive to light. No scleral icterus.  Neck: Normal range of motion. Neck supple. No thyromegaly present.  Lymphadenopathy:    He has no cervical  adenopathy.  Nursing note and vitals reviewed.  Results for orders placed or performed in visit on 06/16/15  Hepatitis C antibody  Result Value Ref Range   HCV Ab NEGATIVE NEGATIVE   Lab Results  Component Value Date   LABURIC 6.1 06/16/2015   Lab Results  Component Value Date   CREATININE 0.99 06/16/2015       Assessment & Plan:   Problem List Items Addressed This Visit    Dysgeusia - Primary    Ongoing since at least 05/2016 (see phone note) but patient endorses worse since initial ortho surgeries 2014.  No significant improvement with tooth paste, biotene or zinc supplements. Will check for laboratory evidence of taste disorders (check vitamins and autoimmune studies, check iron pane and thyroid).  Allopurinol does have ageusis/dysgeusis listed as possible side effects so will see if we can slowly taper down on medication for symptomatic improvement.  Discussed ENT referral if labwork unrevealing vs trial of medication (TCA, gabapentin, klonopin).      Relevant Orders   ANA   RNP Antibody   Vitamin B6   Vitamin B12   Iron and TIBC   Folate   Transferrin   Gout    Chronic, stable on allopurinol 300mg  daily. Check uric acid level. See below.      Relevant Orders   Uric acid   HLD (hyperlipidemia)   Relevant Orders   TSH    Other Visit Diagnoses   None.      Follow up plan: Return if symptoms worsen or fail to improve.  [name], MD

## 2016-04-02 NOTE — Progress Notes (Signed)
Pre visit review using our clinic review tool, if applicable. No additional management support is needed unless otherwise documented below in the visit note. 

## 2016-04-05 DIAGNOSIS — R432 Parageusia: Secondary | ICD-10-CM | POA: Diagnosis not present

## 2016-04-05 DIAGNOSIS — E539 Vitamin B deficiency, unspecified: Secondary | ICD-10-CM | POA: Diagnosis not present

## 2016-04-05 LAB — RNP ANTIBODY: Ribonucleic Protein(ENA) Antibody, IgG: <1

## 2016-04-05 LAB — TRANSFERRIN: TRANSFERRIN: 188 mg/dL (ref 188–341)

## 2016-04-05 LAB — ANA: ANA: NEGATIVE

## 2016-04-09 ENCOUNTER — Other Ambulatory Visit: Payer: Self-pay | Admitting: Family Medicine

## 2016-04-09 LAB — VITAMIN B6: Vitamin B6: 16.1 ng/mL (ref 2.1–21.7)

## 2016-04-09 MED ORDER — ALLOPURINOL 300 MG PO TABS: 150.0000 mg | ORAL_TABLET | Freq: Every day | ORAL | 3 refills | Status: DC

## 2016-04-09 MED ORDER — VITAMIN B-12 1000 MCG PO TABS: 1000.0000 ug | ORAL_TABLET | Freq: Every day | ORAL | Status: DC

## 2016-04-12 ENCOUNTER — Other Ambulatory Visit: Payer: Self-pay

## 2016-06-06 ENCOUNTER — Other Ambulatory Visit: Payer: Self-pay | Admitting: Family Medicine

## 2016-06-07 DIAGNOSIS — M48061 Spinal stenosis, lumbar region without neurogenic claudication: Secondary | ICD-10-CM | POA: Diagnosis not present

## 2016-06-09 DIAGNOSIS — M25662 Stiffness of left knee, not elsewhere classified: Secondary | ICD-10-CM | POA: Diagnosis not present

## 2016-06-09 DIAGNOSIS — Z96652 Presence of left artificial knee joint: Secondary | ICD-10-CM | POA: Diagnosis not present

## 2016-06-11 DIAGNOSIS — Z96652 Presence of left artificial knee joint: Secondary | ICD-10-CM | POA: Diagnosis not present

## 2016-06-11 DIAGNOSIS — M25662 Stiffness of left knee, not elsewhere classified: Secondary | ICD-10-CM | POA: Diagnosis not present

## 2016-06-15 DIAGNOSIS — M25662 Stiffness of left knee, not elsewhere classified: Secondary | ICD-10-CM | POA: Diagnosis not present

## 2016-06-15 DIAGNOSIS — Z96652 Presence of left artificial knee joint: Secondary | ICD-10-CM | POA: Diagnosis not present

## 2016-06-18 DIAGNOSIS — Z96652 Presence of left artificial knee joint: Secondary | ICD-10-CM | POA: Diagnosis not present

## 2016-06-18 DIAGNOSIS — M25662 Stiffness of left knee, not elsewhere classified: Secondary | ICD-10-CM | POA: Diagnosis not present

## 2016-06-21 DIAGNOSIS — M25662 Stiffness of left knee, not elsewhere classified: Secondary | ICD-10-CM | POA: Diagnosis not present

## 2016-06-21 DIAGNOSIS — Z96652 Presence of left artificial knee joint: Secondary | ICD-10-CM | POA: Diagnosis not present

## 2016-06-23 ENCOUNTER — Other Ambulatory Visit: Payer: Self-pay | Admitting: Family Medicine

## 2016-06-24 ENCOUNTER — Ambulatory Visit (INDEPENDENT_AMBULATORY_CARE_PROVIDER_SITE_OTHER): Payer: Medicare Other | Admitting: Physician Assistant

## 2016-06-24 VITALS — BP 124/60 | HR 77 | Temp 98.6°F | Resp 17 | Ht 71.25 in | Wt 249.0 lb

## 2016-06-24 DIAGNOSIS — L03115 Cellulitis of right lower limb: Secondary | ICD-10-CM

## 2016-06-24 LAB — POCT CBC
GRANULOCYTE PERCENT: 67.8 % (ref 37–80)
HEMATOCRIT: 36.2 % — AB (ref 43.5–53.7)
HEMOGLOBIN: 13.4 g/dL — AB (ref 14.1–18.1)
Lymph, poc: 1.6 (ref 0.6–3.4)
MCH: 33.7 pg — AB (ref 27–31.2)
MCHC: 36.9 g/dL — AB (ref 31.8–35.4)
MCV: 91.3 fL (ref 80–97)
MID (cbc): 0.8 (ref 0–0.9)
MPV: 7.1 fL (ref 0–99.8)
POC GRANULOCYTE: 4.9 (ref 2–6.9)
POC LYMPH PERCENT: 21.7 %L (ref 10–50)
POC MID %: 10.5 % (ref 0–12)
Platelet Count, POC: 163 10*3/uL (ref 142–424)
RBC: 3.96 M/uL — AB (ref 4.69–6.13)
RDW, POC: 13.3 %
WBC: 7.2 10*3/uL (ref 4.6–10.2)

## 2016-06-24 MED ORDER — CEPHALEXIN 500 MG PO CAPS: 500.0000 mg | ORAL_CAPSULE | Freq: Three times a day (TID) | ORAL | 0 refills | Status: AC

## 2016-06-24 NOTE — Patient Instructions (Addendum)
Take antibiotic as prescribed.  -Return to clinic if symptoms worsen, do not improve, or as needed  IF you received an x-ray today, you will receive an invoice from [facility]. Please contact [facility] at [phone] with questions or concerns regarding your invoice.   IF you received labwork today, you will receive an invoice from [employer]. Please contact [facility] at [phone] with questions or concerns regarding your invoice.   Our billing staff will not be able to assist you with questions regarding bills from these companies.  You will be contacted with the lab results as soon as they are available. The fastest way to get your results is to activate your My Chart account. Instructions are located on the last page of this paperwork. If you have not heard from [place] regarding the results in 2 weeks, please contact this office.     Cellulitis Cellulitis is an infection of the skin and the tissue under the skin. The infected area is usually red and tender. This happens most often in the arms and lower legs. HOME CARE   Take your antibiotic medicine as told. Finish the medicine even if you start to feel better.  Keep the infected arm or leg raised (elevated).  Put a warm cloth on the area up to 4 times per day.  Only take medicines as told by your doctor.  Keep all doctor visits as told. GET HELP IF:  You see red streaks on the skin coming from the infected area.  Your red area gets bigger or turns a dark color.  Your bone or joint under the infected area is painful after the skin heals.  Your infection comes back in the same area or different area.  You have a puffy (swollen) bump in the infected area.  You have new symptoms.  You have a fever. GET HELP RIGHT AWAY IF:   You feel very sleepy.  You throw up (vomit) or have watery poop (diarrhea).  You feel sick and have muscle aches and pains.   This information is not  intended to replace advice given to you by your health care provider. Make sure you discuss any questions you have with your health care provider.   Document Released: 01/19/2008 Document Revised: 04/23/2015 Document Reviewed: 10/18/2011 Elsevier Interactive Patient Education [employer].

## 2016-06-24 NOTE — Progress Notes (Signed)
Ronald Bullock.  MRN: [id number] DOB: 09/20/1950  Subjective:  Ronald Bullock. is a 65 y.o. male seen in office today for a chief complaint of red, sore right foot x 3 days. Notes that one week ago, he had an itch on that foot so he scratched it with his other foot. He had a break in the skin that he noticed but treated with topical antibiotic ointment. Three days ago the surrounding area became red and sore. Deneis acute injury, purulent discharge and decreased ROM. States the pain has has gotten better over the past day but the redness has gotten worse. Of note, pt does have a history of gout. He is on allopurinol 150mg  daily. Notes this does not feel like his previous gout attacks because the pain is not quite that severe.   Review of Systems  Constitutional: Negative for chills, diaphoresis and fever.    Patient Active Problem List   Diagnosis Date Noted  . Dysgeusia 04/02/2016  . Right hand pain 03/08/2014  . DJD (degenerative joint disease) of hip 03/27/2013    Class: Chronic  . Left knee DJD 09/26/2012    Class: Chronic  . Ulnar neuropathy at wrist 07/11/2012  . Ulnar neuropathy at elbow 07/11/2012  . Right foot pain 07/23/2011  . Healthcare maintenance 07/07/2011  . HLD (hyperlipidemia)   . Arthritis   . OSA (obstructive sleep apnea)   . Obesity, Class I, BMI 30-34.9 09/30/2010  . PERIPHERAL EDEMA 05/12/2010  . Gout 09/24/2009  . ANKLE PAIN, LEFT 09/03/2008  . Essential hypertension 04/25/2007  . HYPERGLYCEMIA 04/16/2004  . ARTHROPATHY, TRANSIENT, MULTIPLE SITES 11/14/2001    Current Outpatient Prescriptions on File Prior to Visit  Medication Sig Dispense Refill  . allopurinol (ZYLOPRIM) 300 MG tablet Take 0.5 tablets (150 mg total) by mouth daily. 45 tablet 3  . lisinopril (PRINIVIL,ZESTRIL) 10 MG tablet Take 1 tablet (10 mg total) by mouth daily. 90 tablet 3  . meloxicam (MOBIC) 7.5 MG tablet TAKE 1 TABLET (7.5 MG TOTAL) BY MOUTH DAILY. AS NEEDED 90  tablet 1  . Omega 3-6-9 Fatty Acids (OMEGA-3-6-9 PO) Take 2 tablets by mouth daily with supper.    . vitamin B-12 (CYANOCOBALAMIN) 1000 MCG tablet Take 1 tablet (1,000 mcg total) by mouth daily.     No current facility-administered medications on file prior to visit.     No Known Allergies   Objective:  BP 124/60 (BP Location: Right Arm, Patient Position: Sitting, Cuff Size: Normal)   Pulse 77   Temp 98.6 F (37 C) (Oral)   Resp 17   Ht 5' 11.25" (1.81 m)   Wt 249 lb (112.9 kg)   SpO2 97%   BMI 34.49 kg/m   Physical Exam  Constitutional: He is oriented to person, place, and time and well-developed, well-nourished, and in no distress.  HENT:  Head: Normocephalic and atraumatic.  Eyes: Conjunctivae are normal.  Neck: Normal range of motion.  Pulmonary/Chest: Effort normal.  Musculoskeletal:       Right lower leg: He exhibits no tenderness and no swelling.       Left lower leg: He exhibits no tenderness and no swelling.       Feet:  Neurological: He is alert and oriented to person, place, and time. Gait normal.  Skin: Skin is warm and dry.  Psychiatric: Affect normal.  Vitals reviewed.    Results for orders placed or performed in visit on 06/24/16 (from the past 24 hour(s))  POCT  CBC     Status: Abnormal   Collection Time: 06/24/16  2:45 PM  Result Value Ref Range   WBC 7.2 4.6 - 10.2 K/uL   Lymph, poc 1.6 0.6 - 3.4   POC LYMPH PERCENT 21.7 10 - 50 %L   MID (cbc) 0.8 0 - 0.9   POC MID % 10.5 0 - 12 %M   POC Granulocyte 4.9 2 - 6.9   Granulocyte percent 67.8 37 - 80 %G   RBC 3.96 (A) 4.69 - 6.13 M/uL   Hemoglobin 13.4 (A) 14.1 - 18.1 g/dL   HCT, POC 36.2 (A) 43.5 - 53.7 %   MCV 91.3 80 - 97 fL   MCH, POC 33.7 (A) 27 - 31.2 pg   MCHC 36.9 (A) 31.8 - 35.4 g/dL   RDW, POC 13.3 %   Platelet Count, POC 163 142 - 424 K/uL   MPV 7.1 0 - 99.8 fL    Assessment and Plan :  1. Cellulitis of right lower extremity -This is not a typical gout presentation, will treat for  cellulitis.  - POCT CBC - cephALEXin (KEFLEX) 500 MG capsule; Take 1 capsule (500 mg total) by mouth 3 (three) times daily.  Dispense: 21 capsule; Refill: 0 -Return to clinic if symptoms worsen, do not improve, or as needed  Tenna Delaine PA-C  [facility] 06/24/2016 2:49 PM

## 2016-06-25 ENCOUNTER — Ambulatory Visit: Payer: Medicare Other | Admitting: Family Medicine

## 2016-06-25 DIAGNOSIS — Z96652 Presence of left artificial knee joint: Secondary | ICD-10-CM | POA: Diagnosis not present

## 2016-06-25 DIAGNOSIS — M25662 Stiffness of left knee, not elsewhere classified: Secondary | ICD-10-CM | POA: Diagnosis not present

## 2016-06-29 DIAGNOSIS — Z96652 Presence of left artificial knee joint: Secondary | ICD-10-CM | POA: Diagnosis not present

## 2016-06-29 DIAGNOSIS — M25662 Stiffness of left knee, not elsewhere classified: Secondary | ICD-10-CM | POA: Diagnosis not present

## 2016-07-04 ENCOUNTER — Other Ambulatory Visit: Payer: Self-pay | Admitting: Family Medicine

## 2016-07-05 DIAGNOSIS — M25662 Stiffness of left knee, not elsewhere classified: Secondary | ICD-10-CM | POA: Diagnosis not present

## 2016-07-05 DIAGNOSIS — Z96652 Presence of left artificial knee joint: Secondary | ICD-10-CM | POA: Diagnosis not present

## 2016-07-12 DIAGNOSIS — M48062 Spinal stenosis, lumbar region with neurogenic claudication: Secondary | ICD-10-CM | POA: Diagnosis not present

## 2016-07-13 DIAGNOSIS — Z96652 Presence of left artificial knee joint: Secondary | ICD-10-CM | POA: Diagnosis not present

## 2016-07-13 DIAGNOSIS — M25662 Stiffness of left knee, not elsewhere classified: Secondary | ICD-10-CM | POA: Diagnosis not present

## 2016-07-30 DIAGNOSIS — M48062 Spinal stenosis, lumbar region with neurogenic claudication: Secondary | ICD-10-CM | POA: Diagnosis not present

## 2016-08-03 ENCOUNTER — Other Ambulatory Visit: Payer: Self-pay | Admitting: Family Medicine

## 2016-08-03 DIAGNOSIS — M1A09X1 Idiopathic chronic gout, multiple sites, with tophus (tophi): Secondary | ICD-10-CM

## 2016-08-03 DIAGNOSIS — Z125 Encounter for screening for malignant neoplasm of prostate: Secondary | ICD-10-CM

## 2016-08-03 DIAGNOSIS — R432 Parageusia: Secondary | ICD-10-CM

## 2016-08-03 DIAGNOSIS — E785 Hyperlipidemia, unspecified: Secondary | ICD-10-CM

## 2016-08-03 DIAGNOSIS — E538 Deficiency of other specified B group vitamins: Secondary | ICD-10-CM

## 2016-08-03 DIAGNOSIS — R7309 Other abnormal glucose: Secondary | ICD-10-CM

## 2016-08-03 DIAGNOSIS — M1289 Other specific arthropathies, not elsewhere classified, multiple sites: Secondary | ICD-10-CM

## 2016-08-04 ENCOUNTER — Ambulatory Visit (INDEPENDENT_AMBULATORY_CARE_PROVIDER_SITE_OTHER): Payer: Medicare Other

## 2016-08-04 VITALS — BP 140/90 | HR 66 | Temp 98.5°F | Ht 71.0 in | Wt 244.5 lb

## 2016-08-04 DIAGNOSIS — R7989 Other specified abnormal findings of blood chemistry: Secondary | ICD-10-CM

## 2016-08-04 DIAGNOSIS — E785 Hyperlipidemia, unspecified: Secondary | ICD-10-CM

## 2016-08-04 DIAGNOSIS — M1289 Other specific arthropathies, not elsewhere classified, multiple sites: Secondary | ICD-10-CM | POA: Diagnosis not present

## 2016-08-04 DIAGNOSIS — R432 Parageusia: Secondary | ICD-10-CM

## 2016-08-04 DIAGNOSIS — Z125 Encounter for screening for malignant neoplasm of prostate: Secondary | ICD-10-CM | POA: Diagnosis not present

## 2016-08-04 DIAGNOSIS — E538 Deficiency of other specified B group vitamins: Secondary | ICD-10-CM

## 2016-08-04 DIAGNOSIS — Z0000 Encounter for general adult medical examination without abnormal findings: Secondary | ICD-10-CM | POA: Diagnosis not present

## 2016-08-04 DIAGNOSIS — R7309 Other abnormal glucose: Secondary | ICD-10-CM | POA: Diagnosis not present

## 2016-08-04 DIAGNOSIS — M1A09X1 Idiopathic chronic gout, multiple sites, with tophus (tophi): Secondary | ICD-10-CM | POA: Diagnosis not present

## 2016-08-04 LAB — CBC WITH DIFFERENTIAL/PLATELET
BASOS ABS: 0 10*3/uL (ref 0.0–0.1)
Basophils Relative: 0.7 % (ref 0.0–3.0)
EOS PCT: 3.9 % (ref 0.0–5.0)
Eosinophils Absolute: 0.2 10*3/uL (ref 0.0–0.7)
HEMATOCRIT: 39.6 % (ref 39.0–52.0)
Hemoglobin: 13.6 g/dL (ref 13.0–17.0)
LYMPHS PCT: 29.3 % (ref 12.0–46.0)
Lymphs Abs: 1.6 10*3/uL (ref 0.7–4.0)
MCHC: 34.2 g/dL (ref 30.0–36.0)
MCV: 92.3 fl (ref 78.0–100.0)
MONOS PCT: 11.8 % (ref 3.0–12.0)
Monocytes Absolute: 0.6 10*3/uL (ref 0.1–1.0)
NEUTROS ABS: 2.9 10*3/uL (ref 1.4–7.7)
Neutrophils Relative %: 54.3 % (ref 43.0–77.0)
PLATELETS: 175 10*3/uL (ref 150.0–400.0)
RBC: 4.3 Mil/uL (ref 4.22–5.81)
RDW: 13.9 % (ref 11.5–15.5)
WBC: 5.4 10*3/uL (ref 4.0–10.5)

## 2016-08-04 LAB — COMPREHENSIVE METABOLIC PANEL
ALBUMIN: 4.3 g/dL (ref 3.5–5.2)
ALT: 35 U/L (ref 0–53)
AST: 25 U/L (ref 0–37)
Alkaline Phosphatase: 64 U/L (ref 39–117)
BUN: 20 mg/dL (ref 6–23)
CHLORIDE: 104 meq/L (ref 96–112)
CO2: 28 meq/L (ref 19–32)
CREATININE: 1.09 mg/dL (ref 0.40–1.50)
Calcium: 9.2 mg/dL (ref 8.4–10.5)
GFR: 71.71 mL/min (ref >60.00)
Glucose, Bld: 131 mg/dL — ABNORMAL HIGH (ref 70–99)
POTASSIUM: 5.3 meq/L — AB (ref 3.5–5.1)
SODIUM: 140 meq/L (ref 135–145)
Total Bilirubin: 0.5 mg/dL (ref 0.2–1.2)
Total Protein: 6.6 g/dL (ref 6.0–8.3)

## 2016-08-04 LAB — HEMOGLOBIN A1C: HEMOGLOBIN A1C: 6 % (ref 4.6–6.5)

## 2016-08-04 LAB — SEDIMENTATION RATE: Sed Rate: 10 mm/hr (ref 0–20)

## 2016-08-04 LAB — LIPID PANEL
CHOL/HDL RATIO: 5
CHOLESTEROL: 146 mg/dL (ref 0–200)
HDL: 29.4 mg/dL — ABNORMAL LOW (ref >39.00)
LDL CALC: 89 mg/dL (ref 0–99)
NONHDL: 116.54
Triglycerides: 138 mg/dL (ref 0.0–149.0)
VLDL: 27.6 mg/dL (ref 0.0–40.0)

## 2016-08-04 LAB — URIC ACID: Uric Acid, Serum: 7.5 mg/dL (ref 4.0–7.8)

## 2016-08-04 LAB — VITAMIN B12: Vitamin B-12: 960 pg/mL — ABNORMAL HIGH (ref 211–911)

## 2016-08-04 LAB — PSA, MEDICARE: PSA: 0.86 ng/ml (ref 0.10–4.00)

## 2016-08-04 NOTE — Patient Instructions (Signed)
Ronald Bullock , Thank you for taking time to come for your Medicare Wellness Visit. I appreciate your ongoing commitment to your health goals. Please review the following plan we discussed and let me know if I can assist you in the future.   These are the goals we discussed: Goals    . Increase physical activity          Starting 08/04/2016, I will continue to exercise for at least 30 min 2-3 days per week.        This is a list of the screening recommended for you and due dates:  Health Maintenance  Topic Date Due  . Flu Shot  08/04/2026*  . Shingles Vaccine  08/04/2026*  . Pneumonia vaccines (1 of 2 - PCV13) 08/04/2026*  . Colon Cancer Screening  07/14/2020  . DTaP/Tdap/Td vaccine (2 - Td) 03/03/2024  . Tetanus Vaccine  03/03/2024  .  Hepatitis C: One time screening is recommended by Center for Disease Control  (CDC) for  adults born from [birth year] through 1967.   Completed  *Topic was postponed. The date shown is not the original due date.   Preventive Care for Adults  A healthy lifestyle and preventive care can promote health and wellness. Preventive health guidelines for adults include the following key practices.  . A routine yearly physical is a good way to check with your health care provider about your health and preventive screening. It is a chance to share any concerns and updates on your health and to receive a thorough exam.  . Visit your dentist for a routine exam and preventive care every 6 months. Brush your teeth twice a day and floss once a day. Good oral hygiene prevents tooth decay and gum disease.  . The frequency of eye exams is based on your age, health, family medical history, use  of contact lenses, and other factors. Follow your health care provider's ecommendations for frequency of eye exams.  . Eat a healthy diet. Foods like vegetables, fruits, whole grains, low-fat dairy products, and lean protein foods contain the nutrients you need without too many  calories. Decrease your intake of foods high in solid fats, added sugars, and salt. Eat the right amount of calories for you. Get information about a proper diet from your health care provider, if necessary.  . Regular physical exercise is one of the most important things you can do for your health. Most adults should get at least 150 minutes of moderate-intensity exercise (any activity that increases your heart rate and causes you to sweat) each week. In addition, most adults need muscle-strengthening exercises on 2 or more days a week.  Silver Sneakers may be a benefit available to you. To determine eligibility, you may visit the website: www.silversneakers.com or contact program at 256-157-8860 Mon-Fri between 8AM-8PM.   . Maintain a healthy weight. The body mass index (BMI) is a screening tool to identify possible weight problems. It provides an estimate of body fat based on height and weight. Your health care provider can find your BMI and can help you achieve or maintain a healthy weight.   For adults 20 years and older: ? A BMI below 18.5 is considered underweight. ? A BMI of 18.5 to 24.9 is normal. ? A BMI of 25 to 29.9 is considered overweight. ? A BMI of 30 and above is considered obese.   . Maintain normal blood lipids and cholesterol levels by exercising and minimizing your intake of saturated fat. Eat a balanced  diet with plenty of fruit and vegetables. Blood tests for lipids and cholesterol should begin at age 57 and be repeated every 5 years. If your lipid or cholesterol levels are high, you are over 50, or you are at high risk for heart disease, you may need your cholesterol levels checked more frequently. Ongoing high lipid and cholesterol levels should be treated with medicines if diet and exercise are not working.  . If you smoke, find out from your health care provider how to quit. If you do not use tobacco, please do not start.  . If you choose to drink alcohol, please do  not consume more than 2 drinks per day. One drink is considered to be 12 ounces (355 mL) of beer, 5 ounces (148 mL) of wine, or 1.5 ounces (44 mL) of liquor.  . If you are 3-63 years old, ask your health care provider if you should take aspirin to prevent strokes.  . Use sunscreen. Apply sunscreen liberally and repeatedly throughout the day. You should seek shade when your shadow is shorter than you. Protect yourself by wearing long sleeves, pants, a wide-brimmed hat, and sunglasses year round, whenever you are outdoors.  . Once a month, do a whole body skin exam, using a mirror to look at the skin on your back. Tell your health care provider of new moles, moles that have irregular borders, moles that are larger than a pencil eraser, or moles that have changed in shape or color.

## 2016-08-04 NOTE — Progress Notes (Signed)
Pre visit review using our clinic review tool, if applicable. No additional management support is needed unless otherwise documented below in the visit note. 

## 2016-08-04 NOTE — Progress Notes (Signed)
I reviewed health advisor's note, was available for consultation, and agree with documentation and plan.  

## 2016-08-04 NOTE — Progress Notes (Signed)
PCP notes:   Health maintenance:  Pt declined all vaccines.   Abnormal screenings:   Hearing - failed  Patient concerns:   Pt has ongoing concern with "burning" tongue.   Nurse concerns:  None  Next PCP appt:   08/20/16 @ 1115

## 2016-08-04 NOTE — Progress Notes (Signed)
Subjective:   Ronald Bullock. is a 65 y.o. male who presents for an Initial Medicare Annual Wellness Visit.  Review of Systems  N/A Cardiac Risk Factors include: advanced age (>41men, >16 women);male gender;obesity (BMI >30kg/m2);hypertension    Objective:    Today's Vitals   08/04/16 0837 08/04/16 0838  BP: 140/90   Pulse: 66   Temp: 98.5 F (36.9 C)   TempSrc: Oral   SpO2: 97%   Weight: 244 lb 8 oz (110.9 kg)   Height: 5\' 11"  (1.803 m)   PainSc: 1  1   PainLoc: Back    Body mass index is 34.1 kg/m.  Current Medications (verified) Outpatient Encounter Prescriptions as of 08/04/2016  Medication Sig  . allopurinol (ZYLOPRIM) 300 MG tablet Take 0.5 tablets (150 mg total) by mouth daily.  Marland Kitchen lisinopril (PRINIVIL,ZESTRIL) 10 MG tablet TAKE 1 TABLET (10 MG TOTAL) BY MOUTH DAILY.  . meloxicam (MOBIC) 7.5 MG tablet TAKE 1 TABLET (7.5 MG TOTAL) BY MOUTH DAILY. AS NEEDED  . Multiple Vitamins-Minerals (ZINC PO) Take 500 mg by mouth as needed.  Ernestine Conrad 3-6-9 Fatty Acids (OMEGA-3-6-9 PO) Take 2 tablets by mouth daily with supper.  . vitamin B-12 (CYANOCOBALAMIN) 1000 MCG tablet Take 1 tablet (1,000 mcg total) by mouth daily.   No facility-administered encounter medications on file as of 08/04/2016.     Allergies (verified) Patient has no known allergies.   History: Past Medical History:  Diagnosis Date  . Arthritis    L end stage knee DKD, mild R knee DJD, R hip end stage DJD  . DDD (degenerative disc disease), cervical    multilevel  . Gout    rare flares  . HLD (hyperlipidemia)    Low HDL  . Hyperglycemia   . Hypertension   . Lumbar spinal stenosis 11/2014   severe central canal stenosis L3/4;L4/5 bilat facet hypertrophy, annular disc bulging, grade 1 sphondylolisthesis (Dumonski) planned PT and ESI Mina Marble)  . OSA on CPAP   . Osteoarthritis    R knee and hip, s/p L TKR   Past Surgical History:  Procedure Laterality Date  . cardiolyte  1997   WNL  .  carotid US  1997   WNL  . CATARACT EXTRACTION W/PHACO Left 12/24/2014   Procedure: CATARACT EXTRACTION PHACO AND INTRAOCULAR LENS PLACEMENT (IOC);  Surgeon: Birder Robson, MD; [place] 01:16AP% 26.4Cde 20.17  . COLONOSCOPY  1998   WNL  . COLONOSCOPY  06/2010   WNL, internal hemorrhoids, rec rpt 10 yrs  . CT HEAD LIMITED W/CM  1997   WNL  . ESOPHAGOGASTRODUODENOSCOPY  1998   gastric polyps, benign  . JOINT REPLACEMENT  1968   Left Knee, cartilage removed  . KNEE ARTHROSCOPY  1968   cartilage removal  . MRI lumbar  2000   mild bulge L3/4, mild foraminal narrowing L4/5, L5/S2, small disk herniation  . MRI lumbar  11/2014   severe L3/4, L4/5 central canal stenosis (Dumonski)  . TOTAL HIP ARTHROPLASTY Right 03/27/2013   TOTAL HIP ARTHROPLASTY ANTERIOR APPROACH;  Surgeon: Hessie Dibble, MD  . TOTAL KNEE ARTHROPLASTY Left 09/26/2012   TOTAL KNEE ARTHROPLASTY;  Surgeon: Hessie Dibble, MD   Family History  Problem Relation Age of Onset  . Stroke Father 44  . Diabetes Father   . Asthma Mother   . Cancer Mother     Vaginal; radiation dz of bowel  . Obesity Sister    Social History   Occupational History  . 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 Smoking status: Never Smoker  . Smokeless tobacco: Never Used  . Alcohol use No     Comment: Rare  . Drug use: No  . Sexual activity: No   Tobacco Counseling Counseling given: No   Activities of Daily Living In your present state of health, do you have any difficulty performing the following activities: 08/04/2016  Hearing? N  Vision? N  Difficulty concentrating or making decisions? N  Walking or climbing stairs? N  Dressing or bathing? N  Doing errands, shopping? N  Preparing Food and eating ? N  Using the Toilet? N  Managing your Medications? N  Managing your Finances? N  Housekeeping or managing your Housekeeping? N  Some recent data might be hidden    Immunizations  and Health Maintenance Immunization History  Administered Date(s) Administered  . Td 07/16/2001, 11/18/2008  . Tdap 03/03/2014   There are no preventive care reminders to display for this patient.  Patient Care Team: [name], MD as PCP - General Arelia Sneddon, OD as Consulting Physician (Optometry) Melrose Nakayama, MD as Consulting Physician (Orthopedic Surgery) Phylliss Bob, MD as Consulting Physician (Orthopedic Surgery) Normajean Glasgow, MD as Attending Physician (Physical Medicine and Rehabilitation) Eula Listen, DDS as Referring Physician (Dentistry)  Indicate any recent Medical Services you may have received from other than [facility] providers in the past year (date may be approximate).    Assessment:   This is a routine wellness examination for Rondal.   Hearing/Vision screen  Hearing Screening   125Hz  250Hz  500Hz  1000Hz  2000Hz  3000Hz  4000Hz  6000Hz  8000Hz   Right ear:   40 40 40  0    Left ear:   0 40 40  0    Vision Screening Comments: Last vision exam in 2017 with Dr. Annamaria Helling  Dietary issues and exercise activities discussed: Current Exercise Habits: Home exercise routine, Type of exercise: walking;Other - see comments (stationary bike), Time (Minutes): 30, Frequency (Times/Week): 3, Weekly Exercise (Minutes/Week): 90, Intensity: Moderate, Exercise limited by: orthopedic condition(s)  Goals    . Increase physical activity          Starting 08/04/2016, I will continue to exercise for at least 30 min 2-3 days per week.       Depression Screen PHQ 2/9 Scores 08/04/2016 06/24/2016  PHQ - 2 Score 0 0    Fall Risk Fall Risk  08/04/2016 06/24/2016 04/12/2016  Falls in the past year? No No No    Cognitive Function: MMSE - Mini Mental State Exam 08/04/2016  Orientation to time 5  Orientation to Place 5  Registration 3  Attention/ Calculation 0  Recall 3  Language- name 2 objects 0  Language- repeat 1  Language- follow 3 step command 3  Language- read & follow  direction 0  Write a sentence 0  Copy design 0  Total score 20     PLEASE NOTE: A Mini-Cog screen was completed. Maximum score is 20. A value of 0 denotes this part of Folstein MMSE was not completed or the patient failed this part of the Mini-Cog screening.   Mini-Cog Screening Orientation to Time - Max 5 pts Orientation to Place - Max 5 pts Registration - Max 3 pts Recall - Max 3 pts Language Repeat - Max 1 pts Language Follow 3 Step Command - Max 3 pts     Screening Tests Health Maintenance  Topic Date Due  . INFLUENZA VACCINE  08/04/2026 (  Originally 03/16/2016)  . ZOSTAVAX  08/04/2026 (Originally 07/14/2009)  . PNA vac Low Risk Adult (1 of 2 - PCV13) 08/04/2026 (Originally 07/14/2014)  . COLONOSCOPY  07/14/2020  . DTaP/Tdap/Td (2 - Td) 03/03/2024  . TETANUS/TDAP  03/03/2024  . Hepatitis C Screening  Completed        Plan:     I have personally reviewed and addressed the Medicare Annual Wellness questionnaire and have noted the following in the patient's chart:  A. Medical and social history B. Use of alcohol, tobacco or illicit drugs  C. Current medications and supplements D. Functional ability and status E.  Nutritional status F.  Physical activity G. Advance directives H. List of other physicians I.  Hospitalizations, surgeries, and ER visits in previous 12 months J.  [place] to include hearing, vision, cognitive, depression L. Referrals and appointments - none  In addition, I have reviewed and discussed with patient certain preventive protocols, quality metrics, and best practice recommendations. A written personalized care plan for preventive services as well as general preventive health recommendations were provided to patient.  See attached scanned questionnaire for additional information.   Signed,   Lindell Noe, MHA, BS, LPN Health Coach

## 2016-08-06 ENCOUNTER — Telehealth: Payer: Self-pay | Admitting: Family Medicine

## 2016-08-06 ENCOUNTER — Other Ambulatory Visit: Payer: Self-pay

## 2016-08-06 MED ORDER — LISINOPRIL 5 MG PO TABS: 5.0000 mg | ORAL_TABLET | Freq: Every day | ORAL | 0 refills | Status: DC

## 2016-08-06 NOTE — Telephone Encounter (Signed)
See phone note

## 2016-08-06 NOTE — Telephone Encounter (Signed)
Pt has lisinopril 10 mg that when he cuts with pill cutter the tablet basically disintegrates. Pt requesting lisinopril 5 mg tab to [facility].

## 2016-08-06 NOTE — Telephone Encounter (Signed)
61mo supply of 5mg  dose sent to pharmacy. Lower dose due to hyperkalemia until seen here.

## 2016-08-06 NOTE — Telephone Encounter (Signed)
Patient cannot cut 10mg  Lisinopril without them breaking apart in to tiny pieces.  He requests new R/X be sent in to [facility].  R/X pending MD approval.

## 2016-08-09 LAB — INTRINSIC FACTOR ANTIBODIES: INTRINSIC FACTOR: NEGATIVE

## 2016-08-20 ENCOUNTER — Ambulatory Visit (INDEPENDENT_AMBULATORY_CARE_PROVIDER_SITE_OTHER): Payer: Medicare Other | Admitting: Family Medicine

## 2016-08-20 ENCOUNTER — Encounter: Payer: Self-pay | Admitting: Family Medicine

## 2016-08-20 VITALS — BP 144/60 | HR 80 | Temp 97.9°F | Wt 245.2 lb

## 2016-08-20 DIAGNOSIS — R7303 Prediabetes: Secondary | ICD-10-CM | POA: Diagnosis not present

## 2016-08-20 DIAGNOSIS — G4733 Obstructive sleep apnea (adult) (pediatric): Secondary | ICD-10-CM

## 2016-08-20 DIAGNOSIS — R432 Parageusia: Secondary | ICD-10-CM

## 2016-08-20 DIAGNOSIS — I10 Essential (primary) hypertension: Secondary | ICD-10-CM

## 2016-08-20 DIAGNOSIS — E785 Hyperlipidemia, unspecified: Secondary | ICD-10-CM

## 2016-08-20 DIAGNOSIS — Z01810 Encounter for preprocedural cardiovascular examination: Secondary | ICD-10-CM | POA: Insufficient documentation

## 2016-08-20 DIAGNOSIS — Z7189 Other specified counseling: Secondary | ICD-10-CM | POA: Diagnosis not present

## 2016-08-20 DIAGNOSIS — E669 Obesity, unspecified: Secondary | ICD-10-CM

## 2016-08-20 DIAGNOSIS — M1A09X0 Idiopathic chronic gout, multiple sites, without tophus (tophi): Secondary | ICD-10-CM

## 2016-08-20 MED ORDER — B-12 500 MCG PO TABS: 500.0000 ug | ORAL_TABLET | Freq: Every day | ORAL | Status: DC

## 2016-08-20 NOTE — Assessment & Plan Note (Signed)
Chronic, stable. Continue current regimen of low dose lisinopril.

## 2016-08-20 NOTE — Assessment & Plan Note (Signed)
Compliant with CPAP 

## 2016-08-20 NOTE — Assessment & Plan Note (Signed)
Predominantly low HDL - discussed increased aerobic exercise.

## 2016-08-20 NOTE — Assessment & Plan Note (Signed)
Chronic, stable on current regimen of allopurinol 150mg  daily.

## 2016-08-20 NOTE — Assessment & Plan Note (Signed)
Discussed avoiding added sugars in diet. 

## 2016-08-20 NOTE — Patient Instructions (Addendum)
Call your insurace about the current shingles shot (zostavax) to see if it is covered or how much it would cost and where is cheaper (here or pharmacy).  If you want to receive here, call for nurse visit. Or wait for new shingles shot (shingrix) to come out.  Decrease b12 to 511mcg daily.  Start vitamin C 500mg  daily.  Watch added sugars.  You are doing well today. Return as needed or in 1 year for next wellness visit.

## 2016-08-20 NOTE — Progress Notes (Signed)
BP (!) 144/60   Pulse 80   Temp 97.9 F (36.6 C) (Oral)   Wt 245 lb 4 oz (111.2 kg)   BMI 34.21 kg/m    CC: AMW f/u visit Subjective:    Patient ID: Ronald Husky., male    DOB: 08-04-1951, 66 y.o.   MRN: [id number]  HPI: Ronald Folks. is a 66 y.o. male presenting on 08/20/2016 for Annual Exam   Saw Katha Cabal last month for medicare wellness visit, note reviewed.   Lisinopril was recently decreased to 5mg  daily for recently found hyperkalemia.  bp running well at home.   Seen at [facility] 06/2016 for cellulitis of R foot treated with keflex course.   Ongoing tongue burning over last 1+ year. Zinc supplement was not helpful. Tried biotene spray, rinse, toothpaste. Maybe ongoing since surgery 2014. Possibly worse with allopurinol. See 03/2016 note for details/plan. Altered texture has significantly improved with B12 supplementation. Burning of tongue with any spicy foods has remained.   Preventative: Colonoscopy 06/2010 wnl, rpt due 10 yr.  H/o internal hemorrhoids.  Prostate last checked 2011 WNL and PSA <1, good stream, no nocturia.  No fmhx. Declines today. Flu - declines Pneumococcal - declines  Tdap 2015 Shingles - declines Advanced directive: discussed - would want wife to be HCPOA then daughter. Asked to bring me copy.  Seat belt use discussed Sunscreen use discussed, no changing moles on skin.  Non smoker Alcohol - rare  Lives with wife, grown children Activity: no regular exercise Diet: good water, some fish, good vegetables, red meat 3x/wk   Relevant past medical, surgical, family and social history reviewed and updated as indicated. Interim medical history since our last visit reviewed. Allergies and medications reviewed and updated. Current Outpatient Prescriptions on File Prior to Visit  Medication Sig  . allopurinol (ZYLOPRIM) 300 MG tablet Take 0.5 tablets (150 mg total) by mouth daily.  Marland Kitchen lisinopril (PRINIVIL,ZESTRIL) 5 MG tablet Take 1 tablet (5  mg total) by mouth daily.  . meloxicam (MOBIC) 7.5 MG tablet TAKE 1 TABLET (7.5 MG TOTAL) BY MOUTH DAILY. AS NEEDED  . Omega 3-6-9 Fatty Acids (OMEGA-3-6-9 PO) Take 2 tablets by mouth daily with supper.   No current facility-administered medications on file prior to visit.     Review of Systems Per HPI unless specifically indicated in ROS section     Objective:    BP (!) 144/60   Pulse 80   Temp 97.9 F (36.6 C) (Oral)   Wt 245 lb 4 oz (111.2 kg)   BMI 34.21 kg/m   Wt Readings from Last 3 Encounters:  08/20/16 245 lb 4 oz (111.2 kg)  08/04/16 244 lb 8 oz (110.9 kg)  06/24/16 249 lb (112.9 kg)    Physical Exam  Constitutional: He appears well-developed and well-nourished. No distress.  HENT:  Mouth/Throat: Oropharynx is clear and moist. No oropharyngeal exudate.  R canal clear L canal with cerumen  Eyes: Conjunctivae are normal. Pupils are equal, round, and reactive to light.  Cardiovascular: Normal rate, regular rhythm, normal heart sounds and intact distal pulses.   No murmur heard. Pulmonary/Chest: Effort normal and breath sounds normal. No respiratory distress. He has no wheezes. He has no rales.  Musculoskeletal: He exhibits no edema.  Skin: Skin is warm and dry. No rash noted.  Psychiatric: He has a normal mood and affect.  Nursing note and vitals reviewed.  Results for orders placed or performed in visit on 08/04/16  Lipid panel  Result Value Ref Range   Cholesterol 146 0 - 200 mg/dL   Triglycerides 138.0 0.0 - 149.0 mg/dL   HDL 29.40 (L) >39.00 mg/dL   VLDL 27.6 0.0 - 40.0 mg/dL   LDL Cholesterol 89 0 - 99 mg/dL   Total CHOL/HDL Ratio 5    NonHDL 116.54   Comprehensive metabolic panel  Result Value Ref Range   Sodium 140 135 - 145 mEq/L   Potassium 5.3 (H) 3.5 - 5.1 mEq/L   Chloride 104 96 - 112 mEq/L   CO2 28 19 - 32 mEq/L   Glucose, Bld 131 (H) 70 - 99 mg/dL   BUN 20 6 - 23 mg/dL   Creatinine, Ser 1.09 0.40 - 1.50 mg/dL   Total Bilirubin 0.5 0.2 -  1.2 mg/dL   Alkaline Phosphatase 64 39 - 117 U/L   AST 25 0 - 37 U/L   ALT 35 0 - 53 U/L   Total Protein 6.6 6.0 - 8.3 g/dL   Albumin 4.3 3.5 - 5.2 g/dL   Calcium 9.2 8.4 - 10.5 mg/dL   GFR 71.71 >60.00 mL/min  Hemoglobin A1c  Result Value Ref Range   Hgb A1c MFr Bld 6.0 4.6 - 6.5 %  PSA, Medicare  Result Value Ref Range   PSA 0.86 0.10 - 4.00 ng/ml  CBC with Differential/Platelet  Result Value Ref Range   WBC 5.4 4.0 - 10.5 K/uL   RBC 4.30 4.22 - 5.81 Mil/uL   Hemoglobin 13.6 13.0 - 17.0 g/dL   HCT 39.6 39.0 - 52.0 %   MCV 92.3 78.0 - 100.0 fl   MCHC 34.2 30.0 - 36.0 g/dL   RDW 13.9 11.5 - 15.5 %   Platelets 175.0 150.0 - 400.0 K/uL   Neutrophils Relative % 54.3 43.0 - 77.0 %   Lymphocytes Relative 29.3 12.0 - 46.0 %   Monocytes Relative 11.8 3.0 - 12.0 %   Eosinophils Relative 3.9 0.0 - 5.0 %   Basophils Relative 0.7 0.0 - 3.0 %   Neutro Abs 2.9 1.4 - 7.7 K/uL   Lymphs Abs 1.6 0.7 - 4.0 K/uL   Monocytes Absolute 0.6 0.1 - 1.0 K/uL   Eosinophils Absolute 0.2 0.0 - 0.7 K/uL   Basophils Absolute 0.0 0.0 - 0.1 K/uL  Vitamin B12  Result Value Ref Range   Vitamin B-12 960 (H) 211 - 911 pg/mL  Uric acid  Result Value Ref Range   Uric Acid, Serum 7.5 4.0 - 7.8 mg/dL  Intrinsic Factor Antibodies  Result Value Ref Range   Intrinsic Factor Negative Negative  Sedimentation rate  Result Value Ref Range   Sed Rate 10 0 - 20 mm/hr      Assessment & Plan:   Problem List Items Addressed This Visit    Advanced care planning/counseling discussion    Advanced directive: discussed - would want wife to be HCPOA then daughter. Asked to bring me copy.       Dysgeusia    Significant improvement noted since B12 shot was restarted. ?taste bud degeneration from B12 deficiency. Continue b12.       Essential hypertension    Chronic, stable. Continue current regimen of low dose lisinopril.       Gout - Primary    Chronic, stable on current regimen of allopurinol 150mg  daily.         HLD (hyperlipidemia)    Predominantly low HDL - discussed increased aerobic exercise.       Obesity, Class I, BMI 30-34.9  Discussed healthy diet and lifestyle changes to affect sustainable weight loss.      OSA (obstructive sleep apnea)    Compliant with CPAP.       Prediabetes    Discussed avoiding added sugars in diet.           Follow up plan: Return in about 1 year (around 08/20/2017) for medicare wellness visit.  [name], MD

## 2016-08-20 NOTE — Assessment & Plan Note (Signed)
Advanced directive: discussed - would want wife to be HCPOA then daughter. Asked to bring me copy.  

## 2016-08-20 NOTE — Assessment & Plan Note (Signed)
Significant improvement noted since B12 shot was restarted. ?taste bud degeneration from B12 deficiency. Continue b12.

## 2016-08-20 NOTE — Assessment & Plan Note (Signed)
Discussed healthy diet and lifestyle changes to affect sustainable weight loss  

## 2016-08-27 ENCOUNTER — Ambulatory Visit: Payer: Medicare Other | Admitting: Family Medicine

## 2016-09-02 ENCOUNTER — Other Ambulatory Visit: Payer: Self-pay | Admitting: Family Medicine

## 2016-09-14 DIAGNOSIS — M48062 Spinal stenosis, lumbar region with neurogenic claudication: Secondary | ICD-10-CM | POA: Diagnosis not present

## 2016-11-30 ENCOUNTER — Other Ambulatory Visit: Payer: Self-pay | Admitting: Family Medicine

## 2016-12-10 DIAGNOSIS — M48062 Spinal stenosis, lumbar region with neurogenic claudication: Secondary | ICD-10-CM | POA: Diagnosis not present

## 2016-12-24 ENCOUNTER — Other Ambulatory Visit: Payer: Self-pay | Admitting: Orthopedic Surgery

## 2016-12-24 DIAGNOSIS — M4800 Spinal stenosis, site unspecified: Secondary | ICD-10-CM

## 2017-01-03 ENCOUNTER — Ambulatory Visit
Admission: RE | Admit: 2017-01-03 | Discharge: 2017-01-03 | Disposition: A | Discharge status: Home / Self Care | Payer: Medicare Other | Source: Ambulatory Visit | Attending: Orthopedic Surgery | Admitting: Orthopedic Surgery

## 2017-01-03 DIAGNOSIS — M4800 Spinal stenosis, site unspecified: Secondary | ICD-10-CM

## 2017-01-03 DIAGNOSIS — M5126 Other intervertebral disc displacement, lumbar region: Secondary | ICD-10-CM | POA: Diagnosis not present

## 2017-01-07 DIAGNOSIS — M48061 Spinal stenosis, lumbar region without neurogenic claudication: Secondary | ICD-10-CM | POA: Diagnosis not present

## 2017-01-22 ENCOUNTER — Encounter: Payer: Self-pay | Admitting: Family Medicine

## 2017-01-22 DIAGNOSIS — M48062 Spinal stenosis, lumbar region with neurogenic claudication: Secondary | ICD-10-CM | POA: Insufficient documentation

## 2017-02-13 HISTORY — PX: LUMBAR EPIDURAL INJECTION: SHX1980

## 2017-03-01 DIAGNOSIS — M48061 Spinal stenosis, lumbar region without neurogenic claudication: Secondary | ICD-10-CM | POA: Diagnosis not present

## 2017-03-18 DIAGNOSIS — G4733 Obstructive sleep apnea (adult) (pediatric): Secondary | ICD-10-CM | POA: Diagnosis not present

## 2017-03-18 DIAGNOSIS — M48062 Spinal stenosis, lumbar region with neurogenic claudication: Secondary | ICD-10-CM | POA: Diagnosis not present

## 2017-03-20 ENCOUNTER — Other Ambulatory Visit: Payer: Self-pay | Admitting: Family Medicine

## 2017-03-29 ENCOUNTER — Encounter: Payer: Self-pay | Admitting: Family Medicine

## 2017-05-24 ENCOUNTER — Other Ambulatory Visit: Payer: Self-pay | Admitting: Family Medicine

## 2017-06-11 ENCOUNTER — Other Ambulatory Visit: Payer: Self-pay | Admitting: Family Medicine

## 2017-08-18 ENCOUNTER — Other Ambulatory Visit: Payer: Self-pay | Admitting: Family Medicine

## 2017-08-23 ENCOUNTER — Other Ambulatory Visit: Payer: Self-pay | Admitting: Family Medicine

## 2017-08-23 DIAGNOSIS — M1A09X0 Idiopathic chronic gout, multiple sites, without tophus (tophi): Secondary | ICD-10-CM

## 2017-08-23 DIAGNOSIS — Z125 Encounter for screening for malignant neoplasm of prostate: Secondary | ICD-10-CM

## 2017-08-23 DIAGNOSIS — R609 Edema, unspecified: Secondary | ICD-10-CM

## 2017-08-23 DIAGNOSIS — R7303 Prediabetes: Secondary | ICD-10-CM

## 2017-08-23 DIAGNOSIS — E785 Hyperlipidemia, unspecified: Secondary | ICD-10-CM

## 2017-08-25 ENCOUNTER — Ambulatory Visit (INDEPENDENT_AMBULATORY_CARE_PROVIDER_SITE_OTHER): Payer: Medicare Other

## 2017-08-25 VITALS — BP 126/64 | HR 67 | Temp 98.5°F | Ht 71.0 in | Wt 238.8 lb

## 2017-08-25 DIAGNOSIS — R609 Edema, unspecified: Secondary | ICD-10-CM

## 2017-08-25 DIAGNOSIS — Z0000 Encounter for general adult medical examination without abnormal findings: Secondary | ICD-10-CM | POA: Diagnosis not present

## 2017-08-25 DIAGNOSIS — R7303 Prediabetes: Secondary | ICD-10-CM

## 2017-08-25 DIAGNOSIS — E785 Hyperlipidemia, unspecified: Secondary | ICD-10-CM | POA: Diagnosis not present

## 2017-08-25 DIAGNOSIS — M1A09X0 Idiopathic chronic gout, multiple sites, without tophus (tophi): Secondary | ICD-10-CM | POA: Diagnosis not present

## 2017-08-25 DIAGNOSIS — Z125 Encounter for screening for malignant neoplasm of prostate: Secondary | ICD-10-CM

## 2017-08-25 LAB — COMPREHENSIVE METABOLIC PANEL
ALBUMIN: 4.2 g/dL (ref 3.5–5.2)
ALT: 34 U/L (ref 0–53)
AST: 24 U/L (ref 0–37)
Alkaline Phosphatase: 59 U/L (ref 39–117)
BUN: 17 mg/dL (ref 6–23)
CO2: 29 mEq/L (ref 19–32)
Calcium: 8.9 mg/dL (ref 8.4–10.5)
Chloride: 104 mEq/L (ref 96–112)
Creatinine, Ser: 1.03 mg/dL (ref 0.40–1.50)
GFR: 76.3 mL/min (ref >60.00)
Glucose, Bld: 123 mg/dL — ABNORMAL HIGH (ref 70–99)
Potassium: 4.4 mEq/L (ref 3.5–5.1)
SODIUM: 140 meq/L (ref 135–145)
Total Bilirubin: 0.7 mg/dL (ref 0.2–1.2)
Total Protein: 6.5 g/dL (ref 6.0–8.3)

## 2017-08-25 LAB — TSH: TSH: 2.5 u[IU]/mL (ref 0.35–4.50)

## 2017-08-25 LAB — LIPID PANEL
CHOLESTEROL: 137 mg/dL (ref 0–200)
HDL: 26.3 mg/dL — AB (ref >39.00)
LDL Cholesterol: 82 mg/dL (ref 0–99)
NONHDL: 110.63
Total CHOL/HDL Ratio: 5
Triglycerides: 143 mg/dL (ref 0.0–149.0)
VLDL: 28.6 mg/dL (ref 0.0–40.0)

## 2017-08-25 LAB — PSA, MEDICARE: PSA: 1 ng/ml (ref 0.10–4.00)

## 2017-08-25 LAB — HEMOGLOBIN A1C: Hgb A1c MFr Bld: 6.1 % (ref 4.6–6.5)

## 2017-08-25 LAB — URIC ACID: URIC ACID, SERUM: 7.5 mg/dL (ref 4.0–7.8)

## 2017-08-25 NOTE — Patient Instructions (Signed)
Ronald Bullock , Thank you for taking time to come for your Medicare Wellness Visit. I appreciate your ongoing commitment to your health goals. Please review the following plan we discussed and let me know if I can assist you in the future.   These are the goals we discussed: Goals    . Follow up with Primary Care Provider     Starting 08/25/2017, I will continue to take medications as prescribed and to keep appointments with PCP as scheduled.        This is a list of the screening recommended for you and due dates:  Health Maintenance  Topic Date Due  . Flu Shot  08/04/2026*  . Pneumonia vaccines (1 of 2 - PCV13) 08/04/2026*  . Colon Cancer Screening  07/14/2020  . DTaP/Tdap/Td vaccine (2 - Td) 03/03/2024  . Tetanus Vaccine  03/03/2024  .  Hepatitis C: One time screening is recommended by Center for Disease Control  (CDC) for  adults born from [birth year] through 1967.   Completed  *Topic was postponed. The date shown is not the original due date.   Preventive Care for Adults  A healthy lifestyle and preventive care can promote health and wellness. Preventive health guidelines for adults include the following key practices.  . A routine yearly physical is a good way to check with your health care provider about your health and preventive screening. It is a chance to share any concerns and updates on your health and to receive a thorough exam.  . Visit your dentist for a routine exam and preventive care every 6 months. Brush your teeth twice a day and floss once a day. Good oral hygiene prevents tooth decay and gum disease.  . The frequency of eye exams is based on your age, health, family medical history, use  of contact lenses, and other factors. Follow your health care provider's recommendations for frequency of eye exams.  . Eat a healthy diet. Foods like vegetables, fruits, whole grains, low-fat dairy products, and lean protein foods contain the nutrients you need without too many  calories. Decrease your intake of foods high in solid fats, added sugars, and salt. Eat the right amount of calories for you. Get information about a proper diet from your health care provider, if necessary.  . Regular physical exercise is one of the most important things you can do for your health. Most adults should get at least 150 minutes of moderate-intensity exercise (any activity that increases your heart rate and causes you to sweat) each week. In addition, most adults need muscle-strengthening exercises on 2 or more days a week.  Silver Sneakers may be a benefit available to you. To determine eligibility, you may visit the website: www.silversneakers.com or contact program at 3156986626 Mon-Fri between 8AM-8PM.   . Maintain a healthy weight. The body mass index (BMI) is a screening tool to identify possible weight problems. It provides an estimate of body fat based on height and weight. Your health care provider can find your BMI and can help you achieve or maintain a healthy weight.   For adults 20 years and older: ? A BMI below 18.5 is considered underweight. ? A BMI of 18.5 to 24.9 is normal. ? A BMI of 25 to 29.9 is considered overweight. ? A BMI of 30 and above is considered obese.   . Maintain normal blood lipids and cholesterol levels by exercising and minimizing your intake of saturated fat. Eat a balanced diet with plenty of fruit and  vegetables. Blood tests for lipids and cholesterol should begin at age 71 and be repeated every 5 years. If your lipid or cholesterol levels are high, you are over 50, or you are at high risk for heart disease, you may need your cholesterol levels checked more frequently. Ongoing high lipid and cholesterol levels should be treated with medicines if diet and exercise are not working.  . If you smoke, find out from your health care provider how to quit. If you do not use tobacco, please do not start.  . If you choose to drink alcohol, please do  not consume more than 2 drinks per day. One drink is considered to be 12 ounces (355 mL) of beer, 5 ounces (148 mL) of wine, or 1.5 ounces (44 mL) of liquor.  . If you are 71-17 years old, ask your health care provider if you should take aspirin to prevent strokes.  . Use sunscreen. Apply sunscreen liberally and repeatedly throughout the day. You should seek shade when your shadow is shorter than you. Protect yourself by wearing long sleeves, pants, a wide-brimmed hat, and sunglasses year round, whenever you are outdoors.  . Once a month, do a whole body skin exam, using a mirror to look at the skin on your back. Tell your health care provider of new moles, moles that have irregular borders, moles that are larger than a pencil eraser, or moles that have changed in shape or color.

## 2017-08-25 NOTE — Progress Notes (Signed)
Pre visit review using our clinic review tool, if applicable. No additional management support is needed unless otherwise documented below in the visit note. 

## 2017-08-28 NOTE — Progress Notes (Signed)
Subjective:   Ronald Botz. is a 67 y.o. male who presents for Medicare Annual/Subsequent preventive examination.  Review of Systems:  N/A  Cardiac Risk Factors include: advanced age (>10men, >82 women);obesity (BMI >30kg/m2);male gender;hypertension     Objective:    Vitals: BP 126/64 (BP Location: Right Arm, Patient Position: Sitting, Cuff Size: Normal)   Pulse 67   Temp 98.5 F (36.9 C) (Oral)   Ht 5\' 11"  (1.803 m) Comment: no shoes  Wt 238 lb 12 oz (108.3 kg)   SpO2 96%   BMI 33.30 kg/m   Body mass index is 33.3 kg/m.  Advanced Directives 08/25/2017 08/04/2016 12/24/2014 03/28/2013 03/19/2013 09/18/2012  Does Patient Have a Medical Advance Directive? No No No Patient has advance directive, copy not in chart Patient has advance directive, copy in chart Patient does not have advance directive;Patient would like information  Type of Advance Directive - - - - [place];Living will -  Copy of [place] in Chart? - - - Copy requested from other (Comment) - -  Would patient like information on creating a medical advance directive? No - Patient declined - - - - Advance directive packet given  Pre-existing out of facility DNR order (yellow form or pink MOST form) - - - - No No    Tobacco Social History   Tobacco Use  Smoking Status Never Smoker  Smokeless Tobacco Never Used     Counseling given: No   Clinical Intake:  Pre-visit preparation completed: Yes  Pain : No/denies pain Pain Score: 0-No pain     Diabetes: No  How often do you need to have someone help you when you read instructions, pamphlets, or other written materials from your doctor or pharmacy?: 1 - Never What is the last grade level you completed in school?: Bachelor degree and additional college courses  Interpreter Needed?: No  Comments: pt lives with spouse  Information entered by :: LPinson, LPN  Past Medical History:  Diagnosis Date  . Arthritis    L end stage knee DKD, mild R knee DJD, R hip end stage DJD  . DDD (degenerative disc disease), cervical    multilevel  . Gout    rare flares  . HLD (hyperlipidemia)    Low HDL  . Hyperglycemia   . Hypertension   . Lumbar spinal stenosis 11/2014   severe central canal stenosis L3/4;L4/5 bilat facet hypertrophy, annular disc bulging, grade 1 sphondylolisthesis (Dumonski) planned PT and ESI Mina Marble)  . OSA on CPAP   . Osteoarthritis    R knee and hip, s/p L TKR   Past Surgical History:  Procedure Laterality Date  . cardiolyte  1997   WNL  . carotid US  1997   WNL  . CATARACT EXTRACTION W/PHACO Left 12/24/2014   Procedure: CATARACT EXTRACTION PHACO AND INTRAOCULAR LENS PLACEMENT (IOC);  Surgeon: Birder Robson, MD; [place] 01:16AP% 26.4Cde 20.17  . COLONOSCOPY  1998   WNL  . COLONOSCOPY  06/2010   WNL, internal hemorrhoids, rec rpt 10 yrs  . CT HEAD LIMITED W/CM  1997   WNL  . ESOPHAGOGASTRODUODENOSCOPY  1998   gastric polyps, benign  . JOINT REPLACEMENT  1968   Left Knee, cartilage removed  . KNEE ARTHROSCOPY  1968   cartilage removal  . LUMBAR EPIDURAL INJECTION  02/2017   L3-4 interlaminar injection Mina Marble)  . MRI lumbar  2000   mild bulge L3/4, mild foraminal narrowing L4/5, L5/S2, small disk herniation  .  MRI lumbar  11/2014   severe L3/4, L4/5 central canal stenosis (Dumonski)  . TOTAL HIP ARTHROPLASTY Right 03/27/2013   TOTAL HIP ARTHROPLASTY ANTERIOR APPROACH;  Surgeon: Hessie Dibble, MD  . TOTAL KNEE ARTHROPLASTY Left 09/26/2012   TOTAL KNEE ARTHROPLASTY;  Surgeon: Hessie Dibble, MD   Family History  Problem Relation Age of Onset  . Stroke Father 44  . Diabetes Father   . Asthma Mother   . Cancer Mother        Vaginal; radiation dz of bowel  . Obesity Sister    Social History   Socioeconomic History  . Marital status: Married    Spouse name: None  . Number of children: 4  . Years of education: None  . Highest education level: None  Social Needs  .  Financial resource strain: None  . Food insecurity - worry: None  . Food insecurity - inability: None  . Transportation needs - medical: None  . Transportation needs - non-medical: None  Occupational History  . Occupation: Database administrator in [employer]  . Smoking status: Never Smoker  . Smokeless tobacco: Never Used  Substance and Sexual Activity  . Alcohol use: No    Comment: Rare  . Drug use: No  . Sexual activity: No  Other Topics Concern  . None  Social History Narrative   Lives with wife, has grown son   Activity: no regular exercise   Diet: good water, some fish, good vegetables, red meat 3x/wk    Outpatient Encounter Medications as of 08/25/2017  Medication Sig  . allopurinol (ZYLOPRIM) 300 MG tablet TAKE 0.5 TABLETS (150 MG TOTAL) BY MOUTH DAILY.  Marland Kitchen Cyanocobalamin (VITAMIN B-12) 500 MCG TABS Take 500 mcg by mouth daily.  Marland Kitchen lisinopril (PRINIVIL,ZESTRIL) 5 MG tablet TAKE 1 TABLET (5 MG TOTAL) BY MOUTH DAILY.  . meloxicam (MOBIC) 7.5 MG tablet TAKE 1 TABLET (7.5 MG TOTAL) BY MOUTH DAILY. AS NEEDED  . Omega 3-6-9 Fatty Acids (OMEGA-3-6-9 PO) Take 2 tablets by mouth daily with supper.  . vitamin C (ASCORBIC ACID) 500 MG tablet Take 500 mg by mouth daily.  . [DISCONTINUED] zinc gluconate 50 MG tablet Take 50 mg by mouth daily.   No facility-administered encounter medications on file as of 08/25/2017.     Activities of Daily Living In your present state of health, do you have any difficulty performing the following activities: 08/25/2017  Hearing? N  Vision? N  Difficulty concentrating or making decisions? N  Walking or climbing stairs? N  Dressing or bathing? N  Doing errands, shopping? N  Preparing Food and eating ? N  Using the Toilet? N  In the past six months, have you accidently leaked urine? N  Do you have problems with loss of bowel control? N  Managing your Medications? N  Managing your Finances? N  Housekeeping or  managing your Housekeeping? N  Some recent data might be hidden    Patient Care Team: [name], MD as PCP - General Arelia Sneddon, [place] as Consulting Physician (Optometry) Melrose Nakayama, MD as Consulting Physician (Orthopedic Surgery) Phylliss Bob, MD as Consulting Physician (Orthopedic Surgery) Normajean Glasgow, MD as Attending Physician (Physical Medicine and Rehabilitation) Eula Listen, DDS as Referring Physician (Dentistry)   Assessment:   This is a routine wellness examination for Ronald Bullock.   Hearing Screening   125Hz  250Hz  500Hz  1000Hz  2000Hz  3000Hz  4000Hz  6000Hz  8000Hz   Right ear:   40 40 40  40  Left ear:   40 40 40  40    Vision Screening Comments: Last vision exam with Dr. Annamaria Helling    Exercise Activities and Dietary recommendations Current Exercise Habits: The patient does not participate in regular exercise at present, Type of exercise: walking;Other - see comments(stationary bike as tolerated), Exercise limited by: orthopedic condition(s)  Goals    . Follow up with Primary Care Provider     Starting 08/25/2017, I will continue to take medications as prescribed and to keep appointments with PCP as scheduled.        Fall Risk Fall Risk  08/25/2017 08/04/2016 06/24/2016 04/12/2016  Falls in the past year? No No No No  Comment - - - Emmi Telephone Survey: data to providers prior to load    Depression Screen PHQ 2/9 Scores 08/25/2017 08/04/2016 06/24/2016  PHQ - 2 Score 0 0 0  PHQ- 9 Score 0 - -    Cognitive Function  MMSE - Mini Mental State Exam 08/25/2017 08/04/2016  Orientation to time 5 5  Orientation to Place 5 5  Registration 3 3  Attention/ Calculation 0 0  Recall 2 3  Recall-comments unable to recall 1 of 3 words -  Language- name 2 objects 0 0  Language- repeat 1 1  Language- follow 3 step command 3 3  Language- read & follow direction 0 0  Write a sentence 0 0  Copy design 0 0  Total score 19 20         PLEASE NOTE: A Mini-Cog  screen was completed. Maximum score is 20. A value of 0 denotes this part of [place]stein MMSE was not completed or the patient failed this part of the Mini-Cog screening.   Mini-Cog Screening Orientation to Time - Max 5 pts Orientation to Place - Max 5 pts Registration - Max 3 pts Recall - Max 3 pts Language Repeat - Max 1 pts Language Follow 3 Step Command - Max 3 pts     Immunization History  Administered Date(s) Administered  . Td 07/16/2001, 11/18/2008  . Tdap 03/03/2014    Screening Tests Health Maintenance  Topic Date Due  . INFLUENZA VACCINE  08/04/2026 (Originally 03/16/2017)  . PNA vac Low Risk Adult (1 of 2 - PCV13) 08/04/2026 (Originally 07/14/2014)  . COLONOSCOPY  07/14/2020  . DTaP/Tdap/Td (2 - Td) 03/03/2024  . TETANUS/TDAP  03/03/2024  . Hepatitis C Screening  Completed     Plan:     I have personally reviewed, addressed, and noted the following in the patient's chart:  A. Medical and social history B. Use of alcohol, tobacco or illicit drugs  C. Current medications and supplements D. Functional ability and status E.  Nutritional status F.  Physical activity G. Advance directives H. List of other physicians I.  Hospitalizations, surgeries, and ER visits in previous 12 months J.  [place] to include hearing, vision, cognitive, depression L. Referrals and appointments - none  In addition, I have reviewed and discussed with patient certain preventive protocols, quality metrics, and best practice recommendations. A written personalized care plan for preventive services as well as general preventive health recommendations were provided to patient.  See attached scanned questionnaire for additional information.   Signed,   Lindell Noe, MHA, BS, LPN Health Coach   Lindell Noe, [place]  [id number]/59/[id number]

## 2017-08-28 NOTE — Progress Notes (Signed)
PCP notes:   Health maintenance:  No gaps identified.   Abnormal screenings:   Mini-Cog score: 19/20  Patient concerns:   Burning tongue - still has concerns despite current treatment plan  Gout management - would like to discuss with PCP taking medication on as needed basis  Back pain - consulting with ortho as it is biggest barrier to exercise  Nurse concerns:  None  Next PCP appt:   08/29/17 @ 1545

## 2017-08-28 NOTE — Progress Notes (Signed)
I reviewed health advisor's note, was available for consultation, and agree with documentation and plan.  

## 2017-08-29 ENCOUNTER — Encounter: Payer: Self-pay | Admitting: Family Medicine

## 2017-08-29 ENCOUNTER — Ambulatory Visit (INDEPENDENT_AMBULATORY_CARE_PROVIDER_SITE_OTHER): Payer: Medicare Other | Admitting: Family Medicine

## 2017-08-29 VITALS — BP 136/62 | HR 67 | Temp 98.0°F | Ht 71.0 in | Wt 245.0 lb

## 2017-08-29 DIAGNOSIS — K146 Glossodynia: Secondary | ICD-10-CM | POA: Diagnosis not present

## 2017-08-29 DIAGNOSIS — M1A09X0 Idiopathic chronic gout, multiple sites, without tophus (tophi): Secondary | ICD-10-CM | POA: Diagnosis not present

## 2017-08-29 DIAGNOSIS — M150 Primary generalized (osteo)arthritis: Secondary | ICD-10-CM

## 2017-08-29 DIAGNOSIS — I10 Essential (primary) hypertension: Secondary | ICD-10-CM | POA: Diagnosis not present

## 2017-08-29 DIAGNOSIS — G4733 Obstructive sleep apnea (adult) (pediatric): Secondary | ICD-10-CM | POA: Diagnosis not present

## 2017-08-29 DIAGNOSIS — Z7189 Other specified counseling: Secondary | ICD-10-CM

## 2017-08-29 DIAGNOSIS — E669 Obesity, unspecified: Secondary | ICD-10-CM | POA: Diagnosis not present

## 2017-08-29 DIAGNOSIS — E785 Hyperlipidemia, unspecified: Secondary | ICD-10-CM | POA: Diagnosis not present

## 2017-08-29 DIAGNOSIS — M159 Polyosteoarthritis, unspecified: Secondary | ICD-10-CM

## 2017-08-29 DIAGNOSIS — M48062 Spinal stenosis, lumbar region with neurogenic claudication: Secondary | ICD-10-CM

## 2017-08-29 DIAGNOSIS — R7303 Prediabetes: Secondary | ICD-10-CM

## 2017-08-29 DIAGNOSIS — E66811 Obesity, class 1: Secondary | ICD-10-CM

## 2017-08-29 MED ORDER — LISINOPRIL 5 MG PO TABS: 5.0000 mg | ORAL_TABLET | Freq: Every day | ORAL | 3 refills | Status: DC

## 2017-08-29 MED ORDER — NYSTATIN 100000 UNIT/ML MT SUSP: 5.0000 mL | Freq: Three times a day (TID) | OROMUCOSAL | 0 refills | Status: DC

## 2017-08-29 NOTE — Assessment & Plan Note (Signed)
Advanced directive: discussed - would want wife to be HCPOA then daughter. Asked to bring me copy.  

## 2017-08-29 NOTE — Progress Notes (Signed)
BP 136/62 (BP Location: Left Arm, Patient Position: Sitting, Cuff Size: Normal)   Pulse 67   Temp 98 F (36.7 C) (Oral)   Ht 5\' 11"  (1.803 m)   Wt 245 lb (111.1 kg)   SpO2 97%   BMI 34.17 kg/m    CC: AMW f/u visit Subjective:    Patient ID: Ronald Bullock., male    DOB: 20-Aug-1950, 67 y.o.   MRN: [id number]  HPI: Ronald Bullock. is a 67 y.o. male presenting on 08/29/2017 for Annual Exam (Pt 2. Wants to discuss allopurinol and vitamins to help with uric acid.  Having issues with burning sensation in the mouth.)   Saw Lesia last week for medicare wellness visit. Note reviewed.   Herniated lumbar disc worsening recently - has increased frequency of meloxicam use. S/p ESI x3, latest 03/2017. Planning to f/u with back doctor.   Ongoing tongue burning over last 1+ year. Zinc supplement was not helpful. Tried biotene spray, rinse, toothpaste. Maybe ongoing since surgery 2014. Possibly worse with allopurinol. See 03/2016 note for details/plan. Altered texture has significantly improved with B12 supplementation. Burning of tongue with any spicy foods has remained.   Preventative: Colonoscopy - 06/2010 wnl, rpt due 10 yr. H/o internal hemorrhoids.  Prostate - DRE last checked 2011 WNL, good stream, no nocturia. No fmhx. Declines DRE today.  Flu - declines  Pneumococcal - declines  Tdap 2015 shingrix - discussed  Advanced directive: discussed - would want wife to be HCPOA then daughter. Asked to bring me copy.  Seat belt use discussed Sunscreen use discussed, no changing moles on skin. Has seen dermatologist.  Non smoker Alcohol - rare  Lives with wife, grown children Occ: traveling Press photographer rep for [employer] Activity: no regular exercise Diet: good water, some fish, good vegetables, red meat 3x/wk   Relevant past medical, surgical, family and social history reviewed and updated as indicated. Interim medical history since our last visit  reviewed. Allergies and medications reviewed and updated. Outpatient Medications Prior to Visit  Medication Sig Dispense Refill  . allopurinol (ZYLOPRIM) 300 MG tablet TAKE 0.5 TABLETS (150 MG TOTAL) BY MOUTH DAILY. 45 tablet 1  . Cyanocobalamin (VITAMIN B-12) 500 MCG TABS Take 500 mcg by mouth daily.    . meloxicam (MOBIC) 7.5 MG tablet TAKE 1 TABLET (7.5 MG TOTAL) BY MOUTH DAILY. AS NEEDED 90 tablet 1  . Omega 3-6-9 Fatty Acids (OMEGA-3-6-9 PO) Take 2 tablets by mouth daily with supper.    . vitamin C (ASCORBIC ACID) 500 MG tablet Take 500 mg by mouth daily.    Marland Kitchen lisinopril (PRINIVIL,ZESTRIL) 5 MG tablet TAKE 1 TABLET (5 MG TOTAL) BY MOUTH DAILY. 30 tablet 0   No facility-administered medications prior to visit.      Per HPI unless specifically indicated in ROS section below Review of Systems     Objective:    BP 136/62 (BP Location: Left Arm, Patient Position: Sitting, Cuff Size: Normal)   Pulse 67   Temp 98 F (36.7 C) (Oral)   Ht 5\' 11"  (1.803 m)   Wt 245 lb (111.1 kg)   SpO2 97%   BMI 34.17 kg/m   Wt Readings from Last 3 Encounters:  08/29/17 245 lb (111.1 kg)  08/25/17 238 lb 12 oz (108.3 kg)  08/20/16 245 lb 4 oz (111.2 kg)    Physical Exam  Constitutional: He is oriented to person, place, and time. He appears well-developed and well-nourished. No distress.  HENT:  Head: Normocephalic and atraumatic.  Right Ear: Hearing, tympanic membrane, external ear and ear canal normal.  Left Ear: Hearing, tympanic membrane, external ear and ear canal normal.  Nose: Nose normal.  Mouth/Throat: Uvula is midline, oropharynx is clear and moist and mucous membranes are normal. No oropharyngeal exudate, posterior oropharyngeal edema, posterior oropharyngeal erythema or tonsillar abscesses.  Red/white patches on tongue  Eyes: Conjunctivae and EOM are normal. Pupils are equal, round, and reactive to light. No scleral icterus.  Neck: Normal range of motion. Neck supple. No thyromegaly  present.  Cardiovascular: Normal rate, regular rhythm, normal heart sounds and intact distal pulses.  No murmur heard. Pulses:      Radial pulses are 2+ on the right side, and 2+ on the left side.  Pulmonary/Chest: Effort normal and breath sounds normal. No respiratory distress. He has no wheezes. He has no rales.  Abdominal: Soft. Bowel sounds are normal. He exhibits no distension and no mass. There is no tenderness. There is no rebound and no guarding.  Genitourinary: Rectum normal and prostate normal. Rectal exam shows no external hemorrhoid, no internal hemorrhoid, no fissure, no mass, no tenderness and anal tone normal. Prostate is not enlarged (15gm) and not tender.  Musculoskeletal: Normal range of motion. He exhibits no edema.  Lymphadenopathy:    He has no cervical adenopathy.  Neurological: He is alert and oriented to person, place, and time.  CN grossly intact, station and gait intact  Skin: Skin is warm and dry. No rash noted.  Psychiatric: He has a normal mood and affect. His behavior is normal. Judgment and thought content normal.  Nursing note and vitals reviewed.  Results for orders placed or performed in visit on 08/25/17  PSA, Medicare  Result Value Ref Range   PSA 1.00 0.10 - 4.00 ng/ml  TSH  Result Value Ref Range   TSH 2.50 0.35 - 4.50 uIU/mL  Comprehensive metabolic panel  Result Value Ref Range   Sodium 140 135 - 145 mEq/L   Potassium 4.4 3.5 - 5.1 mEq/L   Chloride 104 96 - 112 mEq/L   CO2 29 19 - 32 mEq/L   Glucose, Bld 123 (H) 70 - 99 mg/dL   BUN 17 6 - 23 mg/dL   Creatinine, Ser 1.03 0.40 - 1.50 mg/dL   Total Bilirubin 0.7 0.2 - 1.2 mg/dL   Alkaline Phosphatase 59 39 - 117 U/L   AST 24 0 - 37 U/L   ALT 34 0 - 53 U/L   Total Protein 6.5 6.0 - 8.3 g/dL   Albumin 4.2 3.5 - 5.2 g/dL   Calcium 8.9 8.4 - 10.5 mg/dL   GFR 76.30 >60.00 mL/min  Uric acid  Result Value Ref Range   Uric Acid, Serum 7.5 4.0 - 7.8 mg/dL  Lipid panel  Result Value Ref Range    Cholesterol 137 0 - 200 mg/dL   Triglycerides 143.0 0.0 - 149.0 mg/dL   HDL 26.30 (L) >39.00 mg/dL   VLDL 28.6 0.0 - 40.0 mg/dL   LDL Cholesterol 82 0 - 99 mg/dL   Total CHOL/HDL Ratio 5    NonHDL 110.63   Hemoglobin A1c  Result Value Ref Range   Hgb A1c MFr Bld 6.1 4.6 - 6.5 %      Assessment & Plan:   Problem List Items Addressed This Visit    Advanced care planning/counseling discussion - Primary    Advanced directive: discussed - would want wife to be HCPOA then daughter. Asked to  bring me copy.       Burning mouth syndrome    Dysgeusia did improve with b12 replacement. Persistent burning tongue and mouth to certain foods - anything with spice or significant seasoning. Exam today with some white patches not previously seen raising question of geographic tongue.  Suggested trial nystatin swish /swallow to treat possible oral thrush.  Suggested eval by dentist as well.  Consider trial TCA or gabapentin.      Essential hypertension    Chronic, stable. Continue current regimen      Relevant Medications   lisinopril (PRINIVIL,ZESTRIL) 5 MG tablet   Gout    Chronic, stable on 150mg  allopurinol and vitamin C 500mg  daily. No recent gout flare. Urate 7.5 above ideal. rec against lower allopurinol dose.       HLD (hyperlipidemia)    Chronic, stable. Not on statin. Low HDL.  The 10-year ASCVD risk score Mikey Bussing DC Brooke Bonito., et al., 2013) is: 22.1%   Values used to calculate the score:     Age: 54 years     Sex: Male     Is Non-Hispanic African American: No     Diabetic: No     Tobacco smoker: No     Systolic Blood Pressure: 789 mmHg     Is BP treated: Yes     HDL Cholesterol: 26.3 mg/dL     Total Cholesterol: 137 mg/dL       Relevant Medications   lisinopril (PRINIVIL,ZESTRIL) 5 MG tablet   Lumbar stenosis with neurogenic claudication    Ongoing pain. S/p ESI. Discussing possible next step surgery.       Obesity, Class I, BMI 30-34.9    Discussed healthy diet for goal  weight loss. Activity limited by OA pain.      OSA (obstructive sleep apnea)    Reports compliance with CPAP, followed by ENT.       Osteoarthritis, multiple sites   Prediabetes    Reviewed with patient, encourage avoid added sugars.           Follow up plan: Return in about 1 year (around 08/29/2018), or if symptoms worsen or fail to improve, for medicare wellness visit, follow up visit.  [name], MD

## 2017-08-29 NOTE — Assessment & Plan Note (Signed)
Reviewed with patient, encourage avoid added sugars.

## 2017-08-29 NOTE — Assessment & Plan Note (Signed)
Ongoing pain. S/p ESI. Discussing possible next step surgery.

## 2017-08-29 NOTE — Assessment & Plan Note (Signed)
Chronic, stable. Continue current regimen. 

## 2017-08-29 NOTE — Assessment & Plan Note (Signed)
Chronic, stable. Not on statin. Low HDL.  The 10-year ASCVD risk score Ronald Bullock., et al., 2013) is: 22.1%   Values used to calculate the score:     Age: 67 years     Sex: Male     Is Non-Hispanic African American: No     Diabetic: No     Tobacco smoker: No     Systolic Blood Pressure: 161 mmHg     Is BP treated: Yes     HDL Cholesterol: 26.3 mg/dL     Total Cholesterol: 137 mg/dL

## 2017-08-29 NOTE — Patient Instructions (Addendum)
If interested, check with pharmacy about new 2 shot shingles series (shingrix).  Think about 2 pneumonia shots (pneumovax and prevnar) for all adults after age 69 to help prevent most common cause of bacterial pneumonia (streptococcal pneumonia).  Bring me a copy of your living will / advanced directive to update your chart.  Try nystatin swish solution for mouth sensation. See what dentist suggests.   Health Maintenance, Male A healthy lifestyle and preventive care is important for your health and wellness. Ask your health care provider about what schedule of regular examinations is right for you. What should I know about weight and diet? Eat a Healthy Diet  Eat plenty of vegetables, fruits, whole grains, low-fat dairy products, and lean protein.  Do not eat a lot of foods high in solid fats, added sugars, or salt.  Maintain a Healthy Weight Regular exercise can help you achieve or maintain a healthy weight. You should:  Do at least 150 minutes of exercise each week. The exercise should increase your heart rate and make you sweat (moderate-intensity exercise).  Do strength-training exercises at least twice a week.  Watch Your Levels of Cholesterol and Blood Lipids  Have your blood tested for lipids and cholesterol every 5 years starting at 69 years of age. If you are at high risk for heart disease, you should start having your blood tested when you are 69 years old. You may need to have your cholesterol levels checked more often if: ? Your lipid or cholesterol levels are high. ? You are older than 69 years of age. ? You are at high risk for heart disease.  What should I know about cancer screening? Many types of cancers can be detected early and may often be prevented. Lung Cancer  You should be screened every year for lung cancer if: ? You are a current smoker who has smoked for at least 30 years. ? You are a former smoker who has quit within the past 15 years.  Talk to your  health care provider about your screening options, when you should start screening, and how often you should be screened.  Colorectal Cancer  Routine colorectal cancer screening usually begins at 69 years of age and should be repeated every 5-10 years until you are 70 years old. You may need to be screened more often if early forms of precancerous polyps or small growths are found. Your health care provider may recommend screening at an earlier age if you have risk factors for colon cancer.  Your health care provider may recommend using home test kits to check for hidden blood in the stool.  A small camera at the end of a tube can be used to examine your colon (sigmoidoscopy or colonoscopy). This checks for the earliest forms of colorectal cancer.  Prostate and Testicular Cancer  Depending on your age and overall health, your health care provider may do certain tests to screen for prostate and testicular cancer.  Talk to your health care provider about any symptoms or concerns you have about testicular or prostate cancer.  Skin Cancer  Check your skin from head to toe regularly.  Tell your health care provider about any new moles or changes in moles, especially if: ? There is a change in a mole's size, shape, or color. ? You have a mole that is larger than a pencil eraser.  Always use sunscreen. Apply sunscreen liberally and repeat throughout the day.  Protect yourself by wearing long sleeves, pants, a wide-brimmed hat,  and sunglasses when outside.  What should I know about heart disease, diabetes, and high blood pressure?  If you are 2-46 years of age, have your blood pressure checked every 3-5 years. If you are 79 years of age or older, have your blood pressure checked every year. You should have your blood pressure measured twice-once when you are at a hospital or clinic, and once when you are not at a hospital or clinic. Record the average of the two measurements. To check your  blood pressure when you are not at a hospital or clinic, you can use: ? An automated blood pressure machine at a pharmacy. ? A home blood pressure monitor.  Talk to your health care provider about your target blood pressure.  If you are between 12-3 years old, ask your health care provider if you should take aspirin to prevent heart disease.  Have regular diabetes screenings by checking your fasting blood sugar level. ? If you are at a normal weight and have a low risk for diabetes, have this test once every three years after the age of 33. ? If you are overweight and have a high risk for diabetes, consider being tested at a younger age or more often.  A one-time screening for abdominal aortic aneurysm (AAA) by ultrasound is recommended for men aged 13-75 years who are current or former smokers. What should I know about preventing infection? Hepatitis B If you have a higher risk for hepatitis B, you should be screened for this virus. Talk with your health care provider to find out if you are at risk for hepatitis B infection. Hepatitis C Blood testing is recommended for:  Everyone born from [birth year] through 1967.  Anyone with known risk factors for hepatitis C.  Sexually Transmitted Diseases (STDs)  You should be screened each year for STDs including gonorrhea and chlamydia if: ? You are sexually active and are younger than 69 years of age. ? You are older than 69 years of age and your health care provider tells you that you are at risk for this type of infection. ? Your sexual activity has changed since you were last screened and you are at an increased risk for chlamydia or gonorrhea. Ask your health care provider if you are at risk.  Talk with your health care provider about whether you are at high risk of being infected with HIV. Your health care provider may recommend a prescription medicine to help prevent HIV infection.  What else can I do?  Schedule regular health, dental, and  eye exams.  Stay current with your vaccines (immunizations).  Do not use any tobacco products, such as cigarettes, chewing tobacco, and e-cigarettes. If you need help quitting, ask your health care provider.  Limit alcohol intake to no more than 2 drinks per day. One drink equals 12 ounces of beer, 5 ounces of wine, or 1 ounces of hard liquor.  Do not use street drugs.  Do not share needles.  Ask your health care provider for help if you need support or information about quitting drugs.  Tell your health care provider if you often feel depressed.  Tell your health care provider if you have ever been abused or do not feel safe at home. This information is not intended to replace advice given to you by your health care provider. Make sure you discuss any questions you have with your health care provider. Document Released: 01/29/2008 Document Revised: 03/31/2016 Document Reviewed: 05/06/2015 Elsevier Interactive Patient Education  2018 [employer] Inc.  

## 2017-08-29 NOTE — Assessment & Plan Note (Signed)
Chronic, stable on 150mg  allopurinol and vitamin C 500mg  daily. No recent gout flare. Urate 7.5 above ideal. rec against lower allopurinol dose.

## 2017-08-29 NOTE — Assessment & Plan Note (Addendum)
Discussed healthy diet for goal weight loss. Activity limited by OA pain.

## 2017-08-29 NOTE — Assessment & Plan Note (Signed)
Reports compliance with CPAP, followed by ENT.

## 2017-08-29 NOTE — Assessment & Plan Note (Addendum)
Dysgeusia did improve with b12 replacement. Persistent burning tongue and mouth to certain foods - anything with spice or significant seasoning. Exam today with some white patches not previously seen raising question of geographic tongue.  Suggested trial nystatin swish /swallow to treat possible oral thrush.  Suggested eval by dentist as well.  Consider trial TCA or gabapentin.

## 2017-09-01 ENCOUNTER — Ambulatory Visit: Payer: Medicare Other | Admitting: Family Medicine

## 2017-11-04 DIAGNOSIS — H26492 Other secondary cataract, left eye: Secondary | ICD-10-CM | POA: Diagnosis not present

## 2017-11-19 ENCOUNTER — Other Ambulatory Visit: Payer: Self-pay | Admitting: Family Medicine

## 2017-11-21 NOTE — Telephone Encounter (Signed)
Last filled:  08/24/17, #90 Last OV:  08/29/17 Next OV ([facility]):  09/01/18

## 2017-11-22 DIAGNOSIS — M48062 Spinal stenosis, lumbar region with neurogenic claudication: Secondary | ICD-10-CM | POA: Diagnosis not present

## 2017-12-05 ENCOUNTER — Other Ambulatory Visit: Payer: Self-pay | Admitting: Family Medicine

## 2017-12-12 ENCOUNTER — Ambulatory Visit: Payer: Self-pay | Admitting: *Deleted

## 2017-12-12 NOTE — Telephone Encounter (Signed)
Pt has bloating and feeling of  Queasiness  For  Several  Days  Only mild  Pain . Stool  Was  Noted  To be black but patient has been taking  Pepto  Bismol . Patient  Denies  Any  Vomiting or  Severe  Pain. Patient admits eating some spicy food over the weekend. Appt made for later this week with Dr Danise Mina . Advised to stop taking the pepto  And  If any  Severe pain or  Bleeding noted to call  Or  If  Severe  Go to the  ER   Reason for Disposition . Age > 60 years  Answer Assessment - Initial Assessment Questions 1. LOCATION: "Where does it hurt?"       Lower  abd   Bloating    2. RADIATION: "Does the pain shoot anywhere else?" (e.g., chest, back)       no 3. ONSET: "When did the pain begin?" (Minutes, hours or days ago)         2  Days  Ago   4. SUDDEN: "Gradual or sudden onset?"         Ate 2  Days   Developed  sev  Days     5. PATTERN "Does the pain come and go, or is it constant?"    - If constant: "Is it getting better, staying the same, or worsening?"      (Note: Constant means the pain never goes away completely; most serious pain is constant and it progresses)     - If intermittent: "How long does it last?" "Do you have pain now?"     (Note: Intermittent means the pain goes away completely between bouts)        intermittant    6. SEVERITY: "How bad is the pain?"  (e.g., Scale 1-10; mild, moderate, or severe)    - MILD (1-3): doesn't interfere with normal activities, abdomen soft and not tender to touch     - MODERATE (4-7): interferes with normal activities or awakens from sleep, tender to touch     - SEVERE (8-10): excruciating pain, doubled over, unable to do any normal activities         Mild    7. RECURRENT SYMPTOM: "Have you ever had this type of abdominal pain before?" If so, ask: "When was the last time?" and "What happened that time?"         Bloating    8. CAUSE: "What do you think is causing the abdominal pain?"      Maybe  Something  Ate   9. RELIEVING/AGGRAVATING  FACTORS: "What makes it better or worse?" (e.g., movement, antacids, bowel movement)         Pepto  Bismol    10. OTHER SYMPTOMS: "Has there been any vomiting, diarrhea, constipation, or urine problems?"      Black  School   Foul  Odor  Epigastric   Pain  Protocols used: ABDOMINAL PAIN - MALE-A-AH

## 2017-12-15 ENCOUNTER — Encounter: Payer: Self-pay | Admitting: Family Medicine

## 2017-12-15 ENCOUNTER — Ambulatory Visit (INDEPENDENT_AMBULATORY_CARE_PROVIDER_SITE_OTHER): Payer: Medicare Other | Admitting: Family Medicine

## 2017-12-15 DIAGNOSIS — R194 Change in bowel habit: Secondary | ICD-10-CM | POA: Diagnosis not present

## 2017-12-15 NOTE — Assessment & Plan Note (Signed)
In setting of pepto bismol use. BM changes have resolved as he's stopped pepto bismol. Reasssured. Offered iFOB - pt declines. For indigestion suggested daily prilosec OTC x 1-2 wks. Update if ongoing or recurrent symptoms.

## 2017-12-15 NOTE — Progress Notes (Signed)
BP 134/68 (BP Location: Left Arm, Patient Position: Sitting, Cuff Size: Large)   Pulse 65   Temp 97.9 F (36.6 C) (Oral)   Ht 5\' 11"  (1.803 m)   Wt 243 lb (110.2 kg)   SpO2 96%   BMI 33.89 kg/m    CC: dark stools Subjective:    Patient ID: Ronald Husky., male    DOB: [birth year], 67 y.o.   MRN: [id number]  HPI: Ronald Petrik. is a 67 y.o. male presenting on 12/15/2017 for Stool Color Change (States he had dark stools for about 2 wks.  Says he had been taking Pepto Bismol. States he had some gas and bloating.  H/o heartburn. States BMs for the last 2 days have been normal. )   2 wk h/o dark stools. He has been taking pepto bismol due to increased gas, indigestion. This was likely related to different diet.  Last 2 days BMs back to normal - after he stopped pepto bismol.   Pt is color blind - was unable to schedule He takes meloxicam 7.5mg  daily for back pain s/p ESI. He takes fish oil daily  COLONOSCOPY 06/2010 WNL, internal hemorrhoids, rec rpt 10 yrs   Relevant past medical, surgical, family and social history reviewed and updated as indicated. Interim medical history since our last visit reviewed. Allergies and medications reviewed and updated. Outpatient Medications Prior to Visit  Medication Sig Dispense Refill  . allopurinol (ZYLOPRIM) 300 MG tablet TAKE 0.5 TABLETS (150 MG TOTAL) BY MOUTH DAILY. 45 tablet 2  . Cyanocobalamin (VITAMIN B-12) 500 MCG TABS Take 500 mcg by mouth daily.    Marland Kitchen lisinopril (PRINIVIL,ZESTRIL) 5 MG tablet Take 1 tablet (5 mg total) by mouth daily. 90 tablet 3  . meloxicam (MOBIC) 7.5 MG tablet TAKE 1 TABLET (7.5 MG TOTAL) BY MOUTH DAILY. AS NEEDED 90 tablet 1  . Omega 3-6-9 Fatty Acids (OMEGA-3-6-9 PO) Take 2 tablets by mouth daily with supper.    . vitamin C (ASCORBIC ACID) 500 MG tablet Take 500 mg by mouth daily.    Marland Kitchen nystatin (MYCOSTATIN) 100000 UNIT/ML suspension Take 5 mLs (500,000 Units total) by mouth 3 (three) times daily.  120 mL 0   No facility-administered medications prior to visit.      Per HPI unless specifically indicated in ROS section below Review of Systems     Objective:    BP 134/68 (BP Location: Left Arm, Patient Position: Sitting, Cuff Size: Large)   Pulse 65   Temp 97.9 F (36.6 C) (Oral)   Ht 5\' 11"  (1.803 m)   Wt 243 lb (110.2 kg)   SpO2 96%   BMI 33.89 kg/m   Wt Readings from Last 3 Encounters:  12/15/17 243 lb (110.2 kg)  08/29/17 245 lb (111.1 kg)  08/25/17 238 lb 12 oz (108.3 kg)    Physical Exam  Constitutional: He appears well-developed and well-nourished. No distress.  Abdominal: Soft. Bowel sounds are normal. He exhibits no distension and no mass. There is no hepatosplenomegaly. There is no tenderness. There is no rebound, no guarding and no CVA tenderness. No hernia.  Nursing note and vitals reviewed.  Lab Results  Component Value Date   WBC 5.4 08/04/2016   HGB 13.6 08/04/2016   HCT 39.6 08/04/2016   MCV 92.3 08/04/2016   PLT 175.0 08/04/2016       Assessment & Plan:   Problem List Items Addressed This Visit    Bowel habit changes    In  setting of pepto bismol use. BM changes have resolved as he's stopped pepto bismol. Reasssured. Offered iFOB - pt declines. For indigestion suggested daily prilosec OTC x 1-2 wks. Update if ongoing or recurrent symptoms.           No orders of the defined types were placed in this encounter.  No orders of the defined types were placed in this encounter.   Follow up plan: Return if symptoms worsen or fail to improve.  [name], MD

## 2017-12-15 NOTE — Patient Instructions (Addendum)
Probably pepto bismol. I do recommend you start prilosec OTC 20mg  once daily for 1-2 weeks especially if you're taking meloxicam regularly.  Let us know if recurrent symptoms.  Good to see you today.

## 2018-05-03 ENCOUNTER — Other Ambulatory Visit: Payer: Self-pay | Admitting: Family Medicine

## 2018-05-03 NOTE — Telephone Encounter (Signed)
Meloxicam Last filled:  02/17/18, #90 Last OV:  12/15/17, acute Nexto OV:  C[facility]

## 2018-06-16 DIAGNOSIS — Z23 Encounter for immunization: Secondary | ICD-10-CM | POA: Diagnosis not present

## 2018-08-04 ENCOUNTER — Ambulatory Visit (INDEPENDENT_AMBULATORY_CARE_PROVIDER_SITE_OTHER): Payer: Medicare Other | Admitting: Family Medicine

## 2018-08-04 ENCOUNTER — Encounter: Payer: Self-pay | Admitting: Family Medicine

## 2018-08-04 VITALS — BP 124/68 | HR 79 | Temp 97.9°F | Ht 71.0 in | Wt 243.5 lb

## 2018-08-04 DIAGNOSIS — K30 Functional dyspepsia: Secondary | ICD-10-CM | POA: Diagnosis not present

## 2018-08-04 MED ORDER — OMEPRAZOLE 40 MG PO CPDR: 40.0000 mg | DELAYED_RELEASE_CAPSULE | ORAL | 3 refills | Status: DC

## 2018-08-04 NOTE — Assessment & Plan Note (Addendum)
Recent flare of gassiness, bloating, indigestion. rec avoid gas producing foods. rec align probiotic for symptoms. No red flags.  Possibly PNDrainage related - start OTC antihistamine.  With NSAID use, possible gastritis/esophagitis component - will Rx omeprazole 40mg  daily x 3 wks then PRN.  Update with effect. Check labs next month at [facility].

## 2018-08-04 NOTE — Progress Notes (Signed)
BP 124/68 (BP Location: Left Arm, Patient Position: Sitting, Cuff Size: Large)   Pulse 79   Temp 97.9 F (36.6 C) (Oral)   Ht 5\' 11"  (1.803 m)   Wt 243 lb 8 oz (110.5 kg)   SpO2 97%   BMI 33.96 kg/m    CC: GI problems, sinusitis Subjective:    Patient ID: Ronald Husky., male    DOB: November 26, 1950, 67 y.o.   MRN: [id number]  HPI: Ronald Mom. is a 67 y.o. male presenting on 08/04/2018 for GI Problem (C/o gas and bloating after eating. Also, still has burning tongue when eating certain foods. Tried Beano, helpful ) and Sinus Problem (C/o dry cough- worse when lying down and sinus drainage. Tried allergy meds.)   Ongoing trouble with gas and bloating postprandially especially worse over the past 2 days.  Previously took pepto bismol, has also tried prilosec with benefit.  Notices rare dysphagia (2-3 times in last 6 month).  Loose stool after supper yesterday.  Nausea last several days.  No fevers, chills, vomiting. No abd pain or cramping or boring pain to back. No unexpected weight loss, or early satiety.   Diet - limits bread/carbs. No trouble with dairy -- but limits this anyways. Drinks tea, soda, water. Tries to regularly eat salads and grilled chicken. Continues traveling.   Takes meloxicam PRN - about 5 times a week. Takes tylenol as well.   COLONOSCOPY 06/2010 WNL, internal hemorrhoids, rec rpt 10 yrs.  He takes meloxicam 7.5mg  daily for back pain s/p ESI.  He takes fish oil daily.   Sinus cold from grandson started 4 wks ago. Treated with OTC cold remedies (corcedin and guaifenesin and antihistamine). Significant PNDrainage causing nausea. Persistent dry cough worse at night when supine. No fevers/chills, residual sinus congestion/pressure, ST or HA.   Ongoing burning tongue to certain foods ie spicy or chip dips.   Relevant past medical, surgical, family and social history reviewed and updated as indicated. Interim medical history since our last visit  reviewed. Allergies and medications reviewed and updated. Outpatient Medications Prior to Visit  Medication Sig Dispense Refill  . acetaminophen (TYLENOL) 500 MG tablet Take 500 mg by mouth 2 (two) times daily as needed.    Marland Kitchen allopurinol (ZYLOPRIM) 300 MG tablet TAKE 0.5 TABLETS (150 MG TOTAL) BY MOUTH DAILY. 45 tablet 2  . Cyanocobalamin (VITAMIN B-12) 500 MCG TABS Take 500 mcg by mouth daily.    Marland Kitchen lisinopril (PRINIVIL,ZESTRIL) 5 MG tablet Take 1 tablet (5 mg total) by mouth daily. 90 tablet 3  . meloxicam (MOBIC) 7.5 MG tablet TAKE 1 TABLET (7.5 MG TOTAL) BY MOUTH DAILY. AS NEEDED 90 tablet 1  . Omega 3-6-9 Fatty Acids (OMEGA-3-6-9 PO) Take 2 tablets by mouth daily with supper.    . vitamin C (ASCORBIC ACID) 500 MG tablet Take 500 mg by mouth daily.     No facility-administered medications prior to visit.      Per HPI unless specifically indicated in ROS section below Review of Systems     Objective:    BP 124/68 (BP Location: Left Arm, Patient Position: Sitting, Cuff Size: Large)   Pulse 79   Temp 97.9 F (36.6 C) (Oral)   Ht 5\' 11"  (1.803 m)   Wt 243 lb 8 oz (110.5 kg)   SpO2 97%   BMI 33.96 kg/m   Wt Readings from Last 3 Encounters:  08/04/18 243 lb 8 oz (110.5 kg)  12/15/17 243 lb (110.2 kg)  08/29/17 245 lb (111.1 kg)    Physical Exam Vitals signs and nursing note reviewed.  Constitutional:      General: He is not in acute distress.    Appearance: He is well-developed.  HENT:     Head: Normocephalic and atraumatic.     Right Ear: Hearing and external ear normal.     Left Ear: Hearing and external ear normal.     Nose: Nose normal. No mucosal edema or rhinorrhea.     Mouth/Throat:     Mouth: Mucous membranes are moist.     Pharynx: Oropharynx is clear. Uvula midline. No oropharyngeal exudate or posterior oropharyngeal erythema.     Tonsils: No tonsillar abscesses.  Eyes:     General: No scleral icterus.    Conjunctiva/sclera: Conjunctivae normal.     Pupils:  Pupils are equal, round, and reactive to light.  Neck:     Musculoskeletal: Normal range of motion and neck supple.  Cardiovascular:     Rate and Rhythm: Normal rate and regular rhythm.     Heart sounds: Normal heart sounds. No murmur.  Pulmonary:     Effort: Pulmonary effort is normal. No respiratory distress.     Breath sounds: Normal breath sounds. No wheezing or rales.  Abdominal:     General: Abdomen is flat. Bowel sounds are normal. There is no distension.     Palpations: Abdomen is soft. There is no hepatomegaly, splenomegaly or mass.     Tenderness: There is no abdominal tenderness. There is no right CVA tenderness, left CVA tenderness, guarding or rebound. Negative signs include Murphy's sign.     Hernia: No hernia is present.  Lymphadenopathy:     Cervical: No cervical adenopathy.  Skin:    General: Skin is warm and dry.     Findings: No rash.       Assessment & Plan:   Problem List Items Addressed This Visit    Indigestion - Primary    Recent flare of gassiness, bloating, indigestion. rec avoid gas producing foods. rec align probiotic for symptoms. No red flags.  Possibly PNDrainage related - start OTC antihistamine.  With NSAID use, possible gastritis/esophagitis component - will Rx omeprazole 40mg  daily x 3 wks then PRN.  Update with effect. Check labs next month at CPE.           Meds ordered this encounter  Medications  . omeprazole (PRILOSEC) 40 MG capsule    Sig: Take 1 capsule (40 mg total) by mouth as directed. Daily for 3 wks then as needed    Dispense:  30 capsule    Refill:  3   No orders of the defined types were placed in this encounter.   Follow up plan: No follow-ups on file.  [name], MD

## 2018-08-04 NOTE — Patient Instructions (Addendum)
Hold fish oil for next few weeks.  Start omeprazole 40mg  daily for 3 weeks then as needed.  Exclude gas producing foods (beans, onions, celery, carrots, raisins, bananas, apricots, prunes, brussel sprouts, wheat germ, pretzels) Trial of align or activia (probiotic) for bloating/IBS symptoms (OTC). Start zyrtec or claritin OTC antihistamine daily as well.  We will see how this helps  Keep physical appointment for next month.

## 2018-08-30 ENCOUNTER — Ambulatory Visit (INDEPENDENT_AMBULATORY_CARE_PROVIDER_SITE_OTHER): Payer: Medicare Other

## 2018-08-30 ENCOUNTER — Ambulatory Visit: Payer: Medicare Other

## 2018-08-30 VITALS — BP 130/70 | HR 70 | Temp 98.1°F | Ht 72.5 in | Wt 243.8 lb

## 2018-08-30 DIAGNOSIS — R7303 Prediabetes: Secondary | ICD-10-CM

## 2018-08-30 DIAGNOSIS — Z0000 Encounter for general adult medical examination without abnormal findings: Secondary | ICD-10-CM | POA: Diagnosis not present

## 2018-08-30 DIAGNOSIS — Z125 Encounter for screening for malignant neoplasm of prostate: Secondary | ICD-10-CM | POA: Diagnosis not present

## 2018-08-30 DIAGNOSIS — M1000 Idiopathic gout, unspecified site: Secondary | ICD-10-CM | POA: Diagnosis not present

## 2018-08-30 DIAGNOSIS — E785 Hyperlipidemia, unspecified: Secondary | ICD-10-CM

## 2018-08-30 DIAGNOSIS — I10 Essential (primary) hypertension: Secondary | ICD-10-CM

## 2018-08-30 LAB — TSH: TSH: 3.29 u[IU]/mL (ref 0.35–4.50)

## 2018-08-30 LAB — URIC ACID: URIC ACID, SERUM: 7.6 mg/dL (ref 4.0–7.8)

## 2018-08-30 LAB — COMPREHENSIVE METABOLIC PANEL
ALBUMIN: 4.1 g/dL (ref 3.5–5.2)
ALT: 32 U/L (ref 0–53)
AST: 22 U/L (ref 0–37)
Alkaline Phosphatase: 60 U/L (ref 39–117)
BUN: 18 mg/dL (ref 6–23)
CO2: 29 mEq/L (ref 19–32)
CREATININE: 1.16 mg/dL (ref 0.40–1.50)
Calcium: 9.2 mg/dL (ref 8.4–10.5)
Chloride: 103 mEq/L (ref 96–112)
GFR: 66.33 mL/min (ref >60.00)
GLUCOSE: 136 mg/dL — AB (ref 70–99)
Potassium: 4.7 mEq/L (ref 3.5–5.1)
SODIUM: 139 meq/L (ref 135–145)
TOTAL PROTEIN: 6.6 g/dL (ref 6.0–8.3)
Total Bilirubin: 0.5 mg/dL (ref 0.2–1.2)

## 2018-08-30 LAB — LIPID PANEL
CHOLESTEROL: 144 mg/dL (ref 0–200)
HDL: 31.2 mg/dL — ABNORMAL LOW (ref >39.00)
NonHDL: 112.7
Total CHOL/HDL Ratio: 5
Triglycerides: 216 mg/dL — ABNORMAL HIGH (ref 0.0–149.0)
VLDL: 43.2 mg/dL — ABNORMAL HIGH (ref 0.0–40.0)

## 2018-08-30 LAB — HEMOGLOBIN A1C: Hgb A1c MFr Bld: 6.5 % (ref 4.6–6.5)

## 2018-08-30 LAB — PSA, MEDICARE: PSA: 1.15 ng/ml (ref 0.10–4.00)

## 2018-08-30 LAB — LDL CHOLESTEROL, DIRECT: LDL DIRECT: 87 mg/dL

## 2018-08-30 NOTE — Patient Instructions (Signed)
Ronald Bullock , Thank you for taking time to come for your Medicare Wellness Visit. I appreciate your ongoing commitment to your health goals. Please review the following plan we discussed and let me know if I can assist you in the future.   These are the goals we discussed: Goals    . Patient Stated     Starting 08/30/2018, I will continue to take medications as prescribed.        This is a list of the screening recommended for you and due dates:  Health Maintenance  Topic Date Due  . Pneumonia vaccines (1 of 2 - PCV13) 08/04/2026*  . Colon Cancer Screening  07/14/2020  . DTaP/Tdap/Td vaccine (2 - Td) 03/03/2024  . Tetanus Vaccine  03/03/2024  . Flu Shot  Completed  .  Hepatitis C: One time screening is recommended by Center for Disease Control  (CDC) for  adults born from [birth year] through 1967.   Completed  *Topic was postponed. The date shown is not the original due date.   Preventive Care for Adults  A healthy lifestyle and preventive care can promote health and wellness. Preventive health guidelines for adults include the following key practices.  . A routine yearly physical is a good way to check with your health care provider about your health and preventive screening. It is a chance to share any concerns and updates on your health and to receive a thorough exam.  . Visit your dentist for a routine exam and preventive care every 6 months. Brush your teeth twice a day and floss once a day. Good oral hygiene prevents tooth decay and gum disease.  . The frequency of eye exams is based on your age, health, family medical history, use  of contact lenses, and other factors. Follow your health care provider's recommendations for frequency of eye exams.  . Eat a healthy diet. Foods like vegetables, fruits, whole grains, low-fat dairy products, and lean protein foods contain the nutrients you need without too many calories. Decrease your intake of foods high in solid fats, added sugars,  and salt. Eat the right amount of calories for you. Get information about a proper diet from your health care provider, if necessary.  . Regular physical exercise is one of the most important things you can do for your health. Most adults should get at least 150 minutes of moderate-intensity exercise (any activity that increases your heart rate and causes you to sweat) each week. In addition, most adults need muscle-strengthening exercises on 2 or more days a week.  Silver Sneakers may be a benefit available to you. To determine eligibility, you may visit the website: www.silversneakers.com or contact program at [phone] Mon-Fri between 8AM-8PM.   . Maintain a healthy weight. The body mass index (BMI) is a screening tool to identify possible weight problems. It provides an estimate of body fat based on height and weight. Your health care provider can find your BMI and can help you achieve or maintain a healthy weight.   For adults 20 years and older: ? A BMI below 18.5 is considered underweight. ? A BMI of 18.5 to 24.9 is normal. ? A BMI of 25 to 29.9 is considered overweight. ? A BMI of 30 and above is considered obese.   . Maintain normal blood lipids and cholesterol levels by exercising and minimizing your intake of saturated fat. Eat a balanced diet with plenty of fruit and vegetables. Blood tests for lipids and cholesterol should begin at age 80  and be repeated every 5 years. If your lipid or cholesterol levels are high, you are over 50, or you are at high risk for heart disease, you may need your cholesterol levels checked more frequently. Ongoing high lipid and cholesterol levels should be treated with medicines if diet and exercise are not working.  . If you smoke, find out from your health care provider how to quit. If you do not use tobacco, please do not start.  . If you choose to drink alcohol, please do not consume more than 2 drinks per day. One drink is considered to be 12  ounces (355 mL) of beer, 5 ounces (148 mL) of wine, or 1.5 ounces (44 mL) of liquor.  . If you are 55-74 years old, ask your health care provider if you should take aspirin to prevent strokes.  . Use sunscreen. Apply sunscreen liberally and repeatedly throughout the day. You should seek shade when your shadow is shorter than you. Protect yourself by wearing long sleeves, pants, a wide-brimmed hat, and sunglasses year round, whenever you are outdoors.  . Once a month, do a whole body skin exam, using a mirror to look at the skin on your back. Tell your health care provider of new moles, moles that have irregular borders, moles that are larger than a pencil eraser, or moles that have changed in shape or color.

## 2018-08-30 NOTE — Progress Notes (Signed)
Subjective:   Ronald Bullock. is a 68 y.o. male who presents for Medicare Annual/Subsequent preventive examination.  Review of Systems:  N/A Cardiac Risk Factors include: advanced age (>23men, >46 women);obesity (BMI >30kg/m2);male gender;hypertension     Objective:    Vitals: BP 130/70 (BP Location: Right Arm, Patient Position: Sitting, Cuff Size: Normal)   Pulse 70   Temp 98.1 F (36.7 C) (Oral)   Ht 6' 0.5" (1.842 m) Comment: shoes  Wt 243 lb 12 oz (110.6 kg)   SpO2 96%   BMI 32.60 kg/m   Body mass index is 32.6 kg/m.  Advanced Directives 08/30/2018 08/25/2017 08/04/2016 12/24/2014 03/28/2013 03/19/2013 09/18/2012  Does Patient Have a Medical Advance Directive? Yes No No No Patient has advance directive, copy not in chart Patient has advance directive, copy in chart Patient does not have advance directive;Patient would like information  Type of Advance Directive [place];Living will -  Copy of [place] in Chart? No - copy requested - - - Copy requested from other (Comment) - -  Would patient like information on creating a medical advance directive? - No - Patient declined - - - - Advance directive packet given  Pre-existing out of facility DNR order (yellow form or pink MOST form) - - - - - No No    Tobacco Social History   Tobacco Use  Smoking Status Never Smoker  Smokeless Tobacco Never Used     Counseling given: No   Clinical Intake:  Pre-visit preparation completed: Yes  Pain Score: 1      Nutritional Status: BMI > 30  Obese Nutritional Risks: None Diabetes: No  How often do you need to have someone help you when you read instructions, pamphlets, or other written materials from your doctor or pharmacy?: 1 - Never What is the last grade level you completed in school?: Bachelor degree  Interpreter Needed?: No  Comments: pt lives with spouse Information entered by :: LPinson,  LPN  Past Medical History:  Diagnosis Date  . Arthritis    L end stage knee DKD, mild R knee DJD, R hip end stage DJD  . DDD (degenerative disc disease), cervical    multilevel  . Gout    rare flares  . HLD (hyperlipidemia)    Low HDL  . Hyperglycemia   . Hypertension   . Lumbar spinal stenosis 11/2014   severe central canal stenosis L3/4;L4/5 bilat facet hypertrophy, annular disc bulging, grade 1 sphondylolisthesis (Dumonski) planned PT and ESI Mina Marble)  . OSA on CPAP   . Osteoarthritis    R knee and hip, s/p L TKR   Past Surgical History:  Procedure Laterality Date  . cardiolyte  1997   WNL  . carotid US  1997   WNL  . CATARACT EXTRACTION W/PHACO Left 12/24/2014   Procedure: CATARACT EXTRACTION PHACO AND INTRAOCULAR LENS PLACEMENT (IOC);  Surgeon: Birder Robson, MD; [place] 01:16AP% 26.4Cde 20.17  . COLONOSCOPY  1998   WNL  . COLONOSCOPY  06/2010   WNL, internal hemorrhoids, rec rpt 10 yrs  . CT HEAD LIMITED W/CM  1997   WNL  . ESOPHAGOGASTRODUODENOSCOPY  1998   gastric polyps, benign  . JOINT REPLACEMENT  1968   Left Knee, cartilage removed  . KNEE ARTHROSCOPY  1968   cartilage removal  . LUMBAR EPIDURAL INJECTION  02/2017   L3-4 interlaminar injection Mina Marble)  . MRI lumbar  2000  mild bulge L3/4, mild foraminal narrowing L4/5, L5/S2, small disk herniation  . MRI lumbar  11/2014   severe L3/4, L4/5 central canal stenosis (Dumonski)  . TOTAL HIP ARTHROPLASTY Right 03/27/2013   TOTAL HIP ARTHROPLASTY ANTERIOR APPROACH;  Surgeon: Hessie Dibble, MD  . TOTAL KNEE ARTHROPLASTY Left 09/26/2012   TOTAL KNEE ARTHROPLASTY;  Surgeon: Hessie Dibble, MD   Family History  Problem Relation Age of Onset  . Stroke Father 26  . Diabetes Father   . Asthma Mother   . Cancer Mother        Vaginal; radiation dz of bowel  . Obesity Sister    Social History   Socioeconomic History  . Marital status: Married    Spouse name: Not on file  . Number of children: 4  . Years of  education: Not on file  . Highest education level: Not on file  Occupational History  . Occupation: Database administrator in [employer]  . Financial resource strain: Not on file  . Food insecurity:    Worry: Not on file    Inability: Not on file  . Transportation needs:    Medical: Not on file    Non-medical: Not on file  Tobacco Use  . Smoking status: Never Smoker  . Smokeless tobacco: Never Used  Substance and Sexual Activity  . Alcohol use: No    Comment: Rare  . Drug use: No  . Sexual activity: Never  Lifestyle  . Physical activity:    Days per week: Not on file    Minutes per session: Not on file  . Stress: Not on file  Relationships  . Social connections:    Talks on phone: Not on file    Gets together: Not on file    Attends religious service: Not on file    Active member of club or organization: Not on file    Attends meetings of clubs or organizations: Not on file    Relationship status: Not on file  Other Topics Concern  . Not on file  Social History Narrative   Lives with wife, has grown son   Activity: no regular exercise   Diet: good water, some fish, good vegetables, red meat 3x/wk    Outpatient Encounter Medications as of 08/30/2018  Medication Sig  . acetaminophen (TYLENOL) 500 MG tablet Take 500 mg by mouth 2 (two) times daily as needed.  Marland Kitchen allopurinol (ZYLOPRIM) 300 MG tablet TAKE 0.5 TABLETS (150 MG TOTAL) BY MOUTH DAILY.  Marland Kitchen lisinopril (PRINIVIL,ZESTRIL) 5 MG tablet Take 1 tablet (5 mg total) by mouth daily.  Marland Kitchen omeprazole (PRILOSEC) 40 MG capsule Take 1 capsule (40 mg total) by mouth as directed. Daily for 3 wks then as needed  . Cyanocobalamin (VITAMIN B-12) 500 MCG TABS Take 500 mcg by mouth daily. (Patient not taking: Reported on 08/30/2018)  . meloxicam (MOBIC) 7.5 MG tablet TAKE 1 TABLET (7.5 MG TOTAL) BY MOUTH DAILY. AS NEEDED (Patient not taking: Reported on 08/30/2018)  . [DISCONTINUED] Omega 3-6-9 Fatty  Acids (OMEGA-3-6-9 PO) Take 2 tablets by mouth daily with supper.  . [DISCONTINUED] vitamin C (ASCORBIC ACID) 500 MG tablet Take 500 mg by mouth daily.   No facility-administered encounter medications on file as of 08/30/2018.     Activities of Daily Living In your present state of health, do you have any difficulty performing the following activities: 08/30/2018  Hearing? N  Vision? N  Difficulty concentrating or making decisions? N  Walking or climbing stairs? N  Dressing or bathing? N  Doing errands, shopping? N  Preparing Food and eating ? N  Using the Toilet? N  In the past six months, have you accidently leaked urine? N  Do you have problems with loss of bowel control? N  Managing your Medications? N  Managing your Finances? N  Housekeeping or managing your Housekeeping? N  Some recent data might be hidden    Patient Care Team: [name], MD as PCP - General Arelia Sneddon, [place] as Consulting Physician (Optometry) Melrose Nakayama, MD as Consulting Physician (Orthopedic Surgery) Phylliss Bob, MD as Consulting Physician (Orthopedic Surgery) Normajean Glasgow, MD as Attending Physician (Physical Medicine and Rehabilitation) Eula Listen, DDS as Referring Physician (Dentistry)   Assessment:   This is a routine wellness examination for Gwyndolyn Saxon.   Hearing Screening   125Hz  250Hz  500Hz  1000Hz  2000Hz  3000Hz  4000Hz  6000Hz  8000Hz   Right ear:   40 40 40  40    Left ear:   40 40 40  40    Vision Screening Comments: Vision exam in Jan 2019 with Dr. Annamaria Helling  Exercise Activities and Dietary recommendations Current Exercise Habits: The patient does not participate in regular exercise at present, Exercise limited by: orthopedic condition(s)  Goals    . Patient Stated     Starting 08/30/2018, I will continue to take medications as prescribed.        Fall Risk Fall Risk  08/30/2018 08/25/2017 08/04/2016 06/24/2016 04/12/2016  Falls in the past year? 0 No No No No  Comment - -  - - Emmi Telephone Survey: data to providers prior to load   Depression Screen PHQ 2/9 Scores 08/30/2018 08/25/2017 08/04/2016 06/24/2016  PHQ - 2 Score 0 0 0 0  PHQ- 9 Score 0 0 - -    Cognitive Function MMSE - Mini Mental State Exam 08/30/2018 08/25/2017 08/04/2016  Orientation to time 5 5 5   Orientation to Place 5 5 5   Registration 3 3 3   Attention/ Calculation 0 0 0  Recall 3 2 3   Recall-comments - unable to recall 1 of 3 words -  Language- name 2 objects 0 0 0  Language- repeat 1 1 1   Language- follow 3 step command 3 3 3   Language- read & follow direction 0 0 0  Write a sentence 0 0 0  Copy design 0 0 0  Total score 20 19 20      PLEASE NOTE: A Mini-Cog screen was completed. Maximum score is 20. A value of 0 denotes this part of [place] MMSE was not completed or the patient failed this part of the Mini-Cog screening.   Mini-Cog Screening Orientation to Time - Max 5 pts Orientation to Place - Max 5 pts Registration - Max 3 pts Recall - Max 3 pts Language Repeat - Max 1 pts Language Follow 3 Step Command - Max 3 pts     Immunization History  Administered Date(s) Administered  . Influenza, High Dose Seasonal PF 06/16/2018  . Td 07/16/2001, 11/18/2008  . Tdap 03/03/2014    Screening Tests Health Maintenance  Topic Date Due  . PNA vac Low Risk Adult (1 of 2 - PCV13) 08/04/2026 (Originally 07/14/2014)  . COLONOSCOPY  07/14/2020  . DTaP/Tdap/Td (2 - Td) 03/03/2024  . TETANUS/TDAP  03/03/2024  . INFLUENZA VACCINE  Completed  . Hepatitis C Screening  Completed     Plan:     I have personally reviewed, addressed, and noted the following in the patient's  chart:  A. Medical and social history B. Use of alcohol, tobacco or illicit drugs  C. Current medications and supplements D. Functional ability and status E.  Nutritional status F.  Physical activity G. Advance directives H. List of other physicians I.  Hospitalizations, surgeries, and ER visits in previous 12  months J.  [place] to include hearing, vision, cognitive, depression L. Referrals and appointments - none  In addition, I have reviewed and discussed with patient certain preventive protocols, quality metrics, and best practice recommendations. A written personalized care plan for preventive services as well as general preventive health recommendations were provided to patient.  See attached scanned questionnaire for additional information.   Signed,   Lindell Noe, MHA, BS, LPN Health Coach

## 2018-08-30 NOTE — Progress Notes (Signed)
PCP notes:   Health maintenance:  No gaps identified.  Abnormal screenings:   None  Patient concerns:   Patient is still having concerns with gas despite making dietary modifications.   Nurse concerns:  None  Next PCP appt:   09/01/18 @ 0830

## 2018-08-31 ENCOUNTER — Encounter: Payer: Self-pay | Admitting: Family Medicine

## 2018-08-31 ENCOUNTER — Other Ambulatory Visit: Payer: Self-pay | Admitting: Family Medicine

## 2018-08-31 NOTE — Progress Notes (Signed)
BP 124/70 (BP Location: Left Arm, Patient Position: Sitting, Cuff Size: Large)   Pulse 64   Temp 97.8 F (36.6 C) (Oral)   Ht 6' 0.5" (1.842 m)   Wt 247 lb 12 oz (112.4 kg)   SpO2 97%   BMI 33.14 kg/m    CC: CPE Subjective:    Patient ID: Ronald Husky., male    DOB: Jun 25, 1951, 67 y.o.   MRN: [id number]  HPI: Ronald Winterhalter. is a 68 y.o. male presenting on 09/01/2018 for Annual Exam (Pt 2. C/o still having episodes of reflux despite taking med.)   Saw Lesia yesterday for medicare wellness visit. Note reviewed.  Planning to retire next year.    H/o hyperkalemia - so remains on low dose lisinopril.   Worsening indigestion seen last month - omeprazole 40mg  daily was started and continues this daily, has stopped meloxicam. Some improvement in symptoms but still has breakthrough flares of indigestion, GERD, gassiness, bloating. Continues daily [place]. Weight gain noted. No dysphagia or early satiety, vomiting. No diet changes to correlate with symptoms. No cough.   Previously on regular NSAID for chronic back pain s/p ESI (meloxicam 7.5mg ). notes L leg pain worsening since of meloxicam. Upcoming spine injection next week.   Regular with CPAP.   Preventative: Colonoscopy 06/2010 wnl, rpt due 10 yr. H/o internal hemorrhoids.  Prostate last checked 2011 WNL and PSA <1, good stream, no nocturia. No fmhx.  Flu - 06/2018  Pneumococcal - declines  Tdap 2015 Shingles - on wait list at pharmacy Advanced directive: discussed - would want wife to be HCPOA then daughter. Asked to bring me copy.  Seat belt use discussed Sunscreen use discussed, no changing moles on skin.  Non smoker  Alcohol - rare  Dentist has started seeing more regularly   Eye exam tries yearly   Lives with wife  Grown children Occ: truck driver  Activity: no regular exercise Diet: good water, some fish, good vegetables, red meat 3x/wk      Relevant past medical, surgical, family and social  history reviewed and updated as indicated. Interim medical history since our last visit reviewed. Allergies and medications reviewed and updated. Outpatient Medications Prior to Visit  Medication Sig Dispense Refill  . acetaminophen (TYLENOL) 500 MG tablet Take 500 mg by mouth 2 (two) times daily as needed.    Marland Kitchen allopurinol (ZYLOPRIM) 300 MG tablet TAKE 0.5 TABLETS (150 MG TOTAL) BY MOUTH DAILY. 45 tablet 2  . lisinopril (PRINIVIL,ZESTRIL) 5 MG tablet Take 1 tablet (5 mg total) by mouth daily. 90 tablet 3  . omeprazole (PRILOSEC) 40 MG capsule Take 1 capsule (40 mg total) by mouth as directed. Daily for 3 wks then as needed 30 capsule 3  . meloxicam (MOBIC) 7.5 MG tablet TAKE 1 TABLET (7.5 MG TOTAL) BY MOUTH DAILY. AS NEEDED (Patient not taking: Reported on 08/30/2018) 90 tablet 1  . Cyanocobalamin (VITAMIN B-12) 500 MCG TABS Take 500 mcg by mouth daily.     No facility-administered medications prior to visit.      Per HPI unless specifically indicated in ROS section below Review of Systems   Objective:    BP 124/70 (BP Location: Left Arm, Patient Position: Sitting, Cuff Size: Large)   Pulse 64   Temp 97.8 F (36.6 C) (Oral)   Ht 6' 0.5" (1.842 m)   Wt 247 lb 12 oz (112.4 kg)   SpO2 97%   BMI 33.14 kg/m   Wt Readings from Last  3 Encounters:  09/01/18 247 lb 12 oz (112.4 kg)  08/30/18 243 lb 12 oz (110.6 kg)  08/04/18 243 lb 8 oz (110.5 kg)    Physical Exam Vitals signs and nursing note reviewed.  Constitutional:      General: He is not in acute distress.    Appearance: Normal appearance. He is well-developed.  HENT:     Head: Normocephalic and atraumatic.     Right Ear: Hearing, tympanic membrane, ear canal and external ear normal.     Left Ear: Hearing, tympanic membrane, ear canal and external ear normal.     Nose: Nose normal. No mucosal edema or rhinorrhea.     Right Sinus: No maxillary sinus tenderness or frontal sinus tenderness.     Left Sinus: No maxillary sinus  tenderness or frontal sinus tenderness.     Mouth/Throat:     Pharynx: Uvula midline. No oropharyngeal exudate or posterior oropharyngeal erythema.     Tonsils: No tonsillar abscesses.  Eyes:     General: No scleral icterus.    Conjunctiva/sclera: Conjunctivae normal.     Pupils: Pupils are equal, round, and reactive to light.  Neck:     Musculoskeletal: Normal range of motion and neck supple.     Vascular: No carotid bruit.  Cardiovascular:     Rate and Rhythm: Normal rate and regular rhythm.     Heart sounds: Normal heart sounds. No murmur.  Pulmonary:     Effort: Pulmonary effort is normal. No respiratory distress.     Breath sounds: Normal breath sounds. No wheezing or rales.  Genitourinary:    Prostate: Normal. Not enlarged (20gm), not tender and no nodules present.     Rectum: Normal. No mass, tenderness, anal fissure, external hemorrhoid or internal hemorrhoid. Normal anal tone.  Lymphadenopathy:     Cervical: No cervical adenopathy.  Skin:    General: Skin is warm and dry.     Findings: No rash.  Neurological:     Mental Status: He is alert.       Results for orders placed or performed in visit on 08/30/18  PSA, Medicare ([facility])  Result Value Ref Range   PSA 1.15 0.10 - 4.00 ng/ml  TSH  Result Value Ref Range   TSH 3.29 0.35 - 4.50 uIU/mL  Comprehensive metabolic panel  Result Value Ref Range   Sodium 139 135 - 145 mEq/L   Potassium 4.7 3.5 - 5.1 mEq/L   Chloride 103 96 - 112 mEq/L   CO2 29 19 - 32 mEq/L   Glucose, Bld 136 (H) 70 - 99 mg/dL   BUN 18 6 - 23 mg/dL   Creatinine, Ser 1.16 0.40 - 1.50 mg/dL   Total Bilirubin 0.5 0.2 - 1.2 mg/dL   Alkaline Phosphatase 60 39 - 117 U/L   AST 22 0 - 37 U/L   ALT 32 0 - 53 U/L   Total Protein 6.6 6.0 - 8.3 g/dL   Albumin 4.1 3.5 - 5.2 g/dL   Calcium 9.2 8.4 - 10.5 mg/dL   GFR 66.33 >60.00 mL/min  Uric acid  Result Value Ref Range   Uric Acid, Serum 7.6 4.0 - 7.8 mg/dL  Lipid Panel  Result Value Ref Range    Cholesterol 144 0 - 200 mg/dL   Triglycerides 216.0 (H) 0.0 - 149.0 mg/dL   HDL 31.20 (L) >39.00 mg/dL   VLDL 43.2 (H) 0.0 - 40.0 mg/dL   Total CHOL/HDL Ratio 5    NonHDL 112.70   Hemoglobin  A1c  Result Value Ref Range   Hgb A1c MFr Bld 6.5 4.6 - 6.5 %  LDL cholesterol, direct  Result Value Ref Range   Direct LDL 87.0 mg/dL   Assessment & Plan:   Problem List Items Addressed This Visit    Osteoarthritis, multiple sites   OSA (obstructive sleep apnea)    Complaint with CPAP - followed by Dr Constance Holster.       Obesity, Class I, BMI 30-34.9    Encouraged healthy diet and lifestyle changes to affect sustainable weight loss. Activity limited by OA.       Lumbar stenosis with neurogenic claudication    Upcoming ESI next week. Sees Dr Jannet Mantis ortho.       Indigestion    Daily PPI and Emmaline Kluver has helped symptoms but persistent indigestion, gassiness, bloating. Will increase omeprazole to 40mg  bid x 3 wks then reassess. If not improving or worsening, he will let me know for GI referral.       HLD (hyperlipidemia)    Chronic. Not on statin. Trig elevated noted - anticipate related to hyperglycemia. Consider statin.  The 10-year ASCVD risk score Mikey Bussing DC Brooke Bonito., et al., 2013) is: 34%   Values used to calculate the score:     Age: 6 years     Sex: Male     Is Non-Hispanic African American: No     Diabetic: Yes     Tobacco smoker: No     Systolic Blood Pressure: 161 mmHg     Is BP treated: Yes     HDL Cholesterol: 31.2 mg/dL     Total Cholesterol: 144 mg/dL       Gout    Levels above goal but will not make changes to allopurinol given no recent gout flares.       Essential hypertension    Chronic, stable. Continue current regimen.       Controlled diabetes mellitus type 2 with complications ([place])    New diagnosis - reviewed A1c with patient. Diabetes and nutrition handout provided today. Encouraged weight loss. RTC 3-4 mo f/u DM visit.      Advanced care  planning/counseling discussion - Primary    Advanced directive: discussed - would want wife to be HCPOA then daughter. Asked to bring me copy.           Meds ordered this encounter  Medications  . omeprazole (PRILOSEC) 40 MG capsule    Sig: Take 1 capsule (40 mg total) by mouth 2 (two) times daily.    Dispense:  60 capsule    Refill:  3   No orders of the defined types were placed in this encounter.   Follow up plan: Return in about 4 months (around 12/31/2018) for follow up visit.  [name], MD

## 2018-08-31 NOTE — Progress Notes (Signed)
   Subjective:    Patient ID: Ronald Bullock., male    DOB: 04-11-1951, 68 y.o.   MRN: [id number]  HPI  I reviewed health advisor's note, was available for consultation, and agree with documentation and plan.   Review of Systems     Objective:   Physical Exam         Assessment & Plan:

## 2018-09-01 ENCOUNTER — Encounter: Payer: Self-pay | Admitting: Family Medicine

## 2018-09-01 ENCOUNTER — Ambulatory Visit (INDEPENDENT_AMBULATORY_CARE_PROVIDER_SITE_OTHER): Payer: Medicare Other | Admitting: Family Medicine

## 2018-09-01 VITALS — BP 124/70 | HR 64 | Temp 97.8°F | Ht 72.5 in | Wt 247.8 lb

## 2018-09-01 DIAGNOSIS — Z7189 Other specified counseling: Secondary | ICD-10-CM

## 2018-09-01 DIAGNOSIS — I10 Essential (primary) hypertension: Secondary | ICD-10-CM | POA: Diagnosis not present

## 2018-09-01 DIAGNOSIS — M48062 Spinal stenosis, lumbar region with neurogenic claudication: Secondary | ICD-10-CM | POA: Diagnosis not present

## 2018-09-01 DIAGNOSIS — G4733 Obstructive sleep apnea (adult) (pediatric): Secondary | ICD-10-CM

## 2018-09-01 DIAGNOSIS — M1000 Idiopathic gout, unspecified site: Secondary | ICD-10-CM

## 2018-09-01 DIAGNOSIS — E118 Type 2 diabetes mellitus with unspecified complications: Secondary | ICD-10-CM | POA: Diagnosis not present

## 2018-09-01 DIAGNOSIS — E785 Hyperlipidemia, unspecified: Secondary | ICD-10-CM | POA: Diagnosis not present

## 2018-09-01 DIAGNOSIS — M150 Primary generalized (osteo)arthritis: Secondary | ICD-10-CM

## 2018-09-01 DIAGNOSIS — E66811 Obesity, class 1: Secondary | ICD-10-CM

## 2018-09-01 DIAGNOSIS — M159 Polyosteoarthritis, unspecified: Secondary | ICD-10-CM

## 2018-09-01 DIAGNOSIS — E669 Obesity, unspecified: Secondary | ICD-10-CM | POA: Diagnosis not present

## 2018-09-01 DIAGNOSIS — K30 Functional dyspepsia: Secondary | ICD-10-CM | POA: Diagnosis not present

## 2018-09-01 MED ORDER — OMEPRAZOLE 40 MG PO CPDR: 40.0000 mg | DELAYED_RELEASE_CAPSULE | Freq: Two times a day (BID) | ORAL | 3 refills | Status: DC

## 2018-09-01 NOTE — Assessment & Plan Note (Signed)
Levels above goal but will not make changes to allopurinol given no recent gout flares.

## 2018-09-01 NOTE — Assessment & Plan Note (Signed)
Daily PPI and Emmaline Kluver has helped symptoms but persistent indigestion, gassiness, bloating. Will increase omeprazole to 40mg  bid x 3 wks then reassess. If not improving or worsening, he will let me know for GI referral.

## 2018-09-01 NOTE — Assessment & Plan Note (Signed)
Encouraged healthy diet and lifestyle changes to affect sustainable weight loss. Activity limited by OA.

## 2018-09-01 NOTE — Assessment & Plan Note (Signed)
Chronic, stable. Continue current regimen. 

## 2018-09-01 NOTE — Patient Instructions (Addendum)
I recommend pneumovax-23 (pneumonia shot).  Check on shingles shot.  Bring me copy of your advanced directive.  Work on regular exercise routine for goal weight loss over next 3-4 months. Diabetes and nutrition handout provided today.  Return in 3-4 months for diabetes follow up Increase omeprazole to 40mg  twice daily for 3 weeks then return to daily, if no better let us know for GI referral.   Diabetes Mellitus and Nutrition, Adult When you have diabetes (diabetes mellitus), it is very important to have healthy eating habits because your blood sugar (glucose) levels are greatly affected by what you eat and drink. Eating healthy foods in the appropriate amounts, at about the same times every day, can help you:  Control your blood glucose.  Lower your risk of heart disease.  Improve your blood pressure.  Reach or maintain a healthy weight. Every person with diabetes is different, and each person has different needs for a meal plan. Your health care provider may recommend that you work with a diet and nutrition specialist (dietitian) to make a meal plan that is best for you. Your meal plan may vary depending on factors such as:  The calories you need.  The medicines you take.  Your weight.  Your blood glucose, blood pressure, and cholesterol levels.  Your activity level.  Other health conditions you have, such as heart or kidney disease. How do carbohydrates affect me? Carbohydrates, also called carbs, affect your blood glucose level more than any other type of food. Eating carbs naturally raises the amount of glucose in your blood. Carb counting is a method for keeping track of how many carbs you eat. Counting carbs is important to keep your blood glucose at a healthy level, especially if you use insulin or take certain oral diabetes medicines. It is important to know how many carbs you can safely have in each meal. This is different for every person. Your dietitian can help you  calculate how many carbs you should have at each meal and for each snack. Foods that contain carbs include:  Bread, cereal, rice, pasta, and crackers.  Potatoes and corn.  Peas, beans, and lentils.  Milk and yogurt.  Fruit and juice.  Desserts, such as cakes, cookies, ice cream, and candy. How does alcohol affect me? Alcohol can cause a sudden decrease in blood glucose (hypoglycemia), especially if you use insulin or take certain oral diabetes medicines. Hypoglycemia can be a life-threatening condition. Symptoms of hypoglycemia (sleepiness, dizziness, and confusion) are similar to symptoms of having too much alcohol. If your health care provider says that alcohol is safe for you, follow these guidelines:  Limit alcohol intake to no more than 1 drink per day for nonpregnant women and 2 drinks per day for men. One drink equals 12 oz of beer, 5 oz of wine, or 1 oz of hard liquor.  Do not drink on an empty stomach.  Keep yourself hydrated with water, diet soda, or unsweetened iced tea.  Keep in mind that regular soda, juice, and other mixers may contain a lot of sugar and must be counted as carbs. What are tips for following this plan?  Reading food labels  Start by checking the serving size on the "Nutrition Facts" label of packaged foods and drinks. The amount of calories, carbs, fats, and other nutrients listed on the label is based on one serving of the item. Many items contain more than one serving per package.  Check the total grams (g) of carbs in one  serving. You can calculate the number of servings of carbs in one serving by dividing the total carbs by 15. For example, if a food has 30 g of total carbs, it would be equal to 2 servings of carbs.  Check the number of grams (g) of saturated and trans fats in one serving. Choose foods that have low or no amount of these fats.  Check the number of milligrams (mg) of salt (sodium) in one serving. Most people should limit total  sodium intake to less than 2,300 mg per day.  Always check the nutrition information of foods labeled as "low-fat" or "nonfat". These foods may be higher in added sugar or refined carbs and should be avoided.  Talk to your dietitian to identify your daily goals for nutrients listed on the label. Shopping  Avoid buying canned, premade, or processed foods. These foods tend to be high in fat, sodium, and added sugar.  Shop around the outside edge of the grocery store. This includes fresh fruits and vegetables, bulk grains, fresh meats, and fresh dairy. Cooking  Use low-heat cooking methods, such as baking, instead of high-heat cooking methods like deep frying.  Cook using healthy oils, such as olive, canola, or sunflower oil.  Avoid cooking with butter, cream, or high-fat meats. Meal planning  Eat meals and snacks regularly, preferably at the same times every day. Avoid going long periods of time without eating.  Eat foods high in fiber, such as fresh fruits, vegetables, beans, and whole grains. Talk to your dietitian about how many servings of carbs you can eat at each meal.  Eat 4-6 ounces (oz) of lean protein each day, such as lean meat, chicken, fish, eggs, or tofu. One oz of lean protein is equal to: ? 1 oz of meat, chicken, or fish. ? 1 egg. ?  cup of tofu.  Eat some foods each day that contain healthy fats, such as avocado, nuts, seeds, and fish. Lifestyle  Check your blood glucose regularly.  Exercise regularly as told by your health care provider. This may include: ? 150 minutes of moderate-intensity or vigorous-intensity exercise each week. This could be brisk walking, biking, or water aerobics. ? Stretching and doing strength exercises, such as yoga or weightlifting, at least 2 times a week.  Take medicines as told by your health care provider.  Do not use any products that contain nicotine or tobacco, such as cigarettes and e-cigarettes. If you need help quitting, ask  your health care provider.  Work with a Social worker or diabetes educator to identify strategies to manage stress and any emotional and social challenges. Questions to ask a health care provider  Do I need to meet with a diabetes educator?  Do I need to meet with a dietitian?  What number can I call if I have questions?  When are the best times to check my blood glucose? Where to find more information:  American Diabetes Association: diabetes.org  Academy of Nutrition and Dietetics: www.eatright.[employer] of Diabetes and Digestive and Kidney Diseases (NIH): [employer] Summary  A healthy meal plan will help you control your blood glucose and maintain a healthy lifestyle.  Working with a diet and nutrition specialist (dietitian) can help you make a meal plan that is best for you.  Keep in mind that carbohydrates (carbs) and alcohol have immediate effects on your blood glucose levels. It is important to count carbs and to use alcohol carefully. This information is not intended to replace advice given to  you by your health care provider. Make sure you discuss any questions you have with your health care provider. Document Released: 04/29/2005 Document Revised: 03/02/2017 Document Reviewed: 09/06/2016 Elsevier Interactive Patient Education  2019 [employer].

## 2018-09-01 NOTE — Assessment & Plan Note (Signed)
Advanced directive: discussed - would want wife to be HCPOA then daughter. Asked to bring me copy.

## 2018-09-01 NOTE — Assessment & Plan Note (Signed)
Upcoming ESI next week. Sees Dr Jannet Mantis ortho.

## 2018-09-01 NOTE — Assessment & Plan Note (Signed)
Complaint with CPAP - followed by Dr Constance Holster.

## 2018-09-01 NOTE — Assessment & Plan Note (Addendum)
New diagnosis - reviewed A1c with patient. Diabetes and nutrition handout provided today. Encouraged weight loss. RTC 3-4 mo f/u DM visit.

## 2018-09-01 NOTE — Assessment & Plan Note (Addendum)
Chronic. Not on statin. Trig elevated noted - anticipate related to hyperglycemia. Consider statin.  The 10-year ASCVD risk score Mikey Bussing DC Brooke Bonito., et al., 2013) is: 34%   Values used to calculate the score:     Age: 68 years     Sex: Male     Is Non-Hispanic African American: No     Diabetic: Yes     Tobacco smoker: No     Systolic Blood Pressure: 564 mmHg     Is BP treated: Yes     HDL Cholesterol: 31.2 mg/dL     Total Cholesterol: 144 mg/dL

## 2018-09-03 ENCOUNTER — Other Ambulatory Visit: Payer: Self-pay | Admitting: Family Medicine

## 2018-09-05 ENCOUNTER — Other Ambulatory Visit: Payer: Self-pay | Admitting: Family Medicine

## 2018-09-05 DIAGNOSIS — M48062 Spinal stenosis, lumbar region with neurogenic claudication: Secondary | ICD-10-CM | POA: Diagnosis not present

## 2018-10-27 ENCOUNTER — Other Ambulatory Visit: Payer: Self-pay | Admitting: Family Medicine

## 2018-11-01 ENCOUNTER — Other Ambulatory Visit: Payer: Self-pay | Admitting: Family Medicine

## 2018-11-01 NOTE — Telephone Encounter (Signed)
Meloxicam filled last on 05/05/2018 for 90 tablets with 1 refill. Please review

## 2018-12-01 ENCOUNTER — Encounter: Payer: Self-pay | Admitting: Family Medicine

## 2018-12-01 ENCOUNTER — Ambulatory Visit (INDEPENDENT_AMBULATORY_CARE_PROVIDER_SITE_OTHER): Payer: Medicare Other | Admitting: Family Medicine

## 2018-12-01 ENCOUNTER — Telehealth: Payer: Self-pay

## 2018-12-01 VITALS — Ht 72.5 in

## 2018-12-01 DIAGNOSIS — I10 Essential (primary) hypertension: Secondary | ICD-10-CM | POA: Diagnosis not present

## 2018-12-01 DIAGNOSIS — E118 Type 2 diabetes mellitus with unspecified complications: Secondary | ICD-10-CM | POA: Diagnosis not present

## 2018-12-01 DIAGNOSIS — M48062 Spinal stenosis, lumbar region with neurogenic claudication: Secondary | ICD-10-CM

## 2018-12-01 DIAGNOSIS — E785 Hyperlipidemia, unspecified: Secondary | ICD-10-CM | POA: Diagnosis not present

## 2018-12-01 DIAGNOSIS — M1A00X0 Idiopathic chronic gout, unspecified site, without tophus (tophi): Secondary | ICD-10-CM

## 2018-12-01 DIAGNOSIS — K30 Functional dyspepsia: Secondary | ICD-10-CM

## 2018-12-01 MED ORDER — FISH OIL 1200 MG PO CAPS: 1.0000 | ORAL_CAPSULE | Freq: Two times a day (BID) | ORAL | Status: DC

## 2018-12-01 NOTE — Assessment & Plan Note (Signed)
BID PPI worsened symptoms. Off PPI for the past 2 months. Stable period recently. Finds certain foods such as barbeque aggravate symptoms.

## 2018-12-01 NOTE — Assessment & Plan Note (Signed)
Ongoing struggle with lower back pain. Considering surgical intervention later this year.

## 2018-12-01 NOTE — Assessment & Plan Note (Addendum)
Congratulated on healthy changes to date. Anticipate improved control with healthy diet changes and endorsed weight loss. Will have him come in for curbside labs then decide on further eval/treatment at that time. RTC 6 mo f/u visit.

## 2018-12-01 NOTE — Assessment & Plan Note (Signed)
Chronic, not on statin. He has restarted fish oil tablets daily. Update FLP.

## 2018-12-01 NOTE — Assessment & Plan Note (Signed)
Tolerating lisinopril. Does not have bp cuff to check readings.

## 2018-12-01 NOTE — Telephone Encounter (Signed)
LVM pt needs labs now and schedule a 6 month ov diabetes.

## 2018-12-01 NOTE — Telephone Encounter (Signed)
Left message on vm per dpr asking pt to call back.  Needs labs now and 6 mo diabetes f/u OV.

## 2018-12-01 NOTE — Assessment & Plan Note (Signed)
Stable period on allopurinol, occasionally misses doses. No recent gout flare.

## 2018-12-01 NOTE — Progress Notes (Signed)
Virtual visit completed through Doxy[employer]Me. Due to national recommendations of social distancing due to [place] 19, a virtual visit is felt to be most appropriate for this patient at this time.   Patient location: home Provider location: [place] [facility], office If any vitals were documented, they were collected by patient at home unless specified below.    Ht 6' 0.5" (1.842 m)   BMI 33.14 kg/m    CC: 3 mo DM f/u visit Subjective:    Patient ID: Ronald Bullock., male    DOB: 13-Sep-1950, 68 y.o.   MRN: [id number]  HPI: Ronald Bullock. is a 68 y.o. male presenting on 12/01/2018 for Diabetes (3-4 mo f/u. )   He's lost weight. Does not have scale to measure. Healthier eating since he's been out of work. Less driving, more active. Planning to retire at end of year.   Indigestion - BID PPI caused worsening diarrhea and vomiting x4d. No further bloating trouble over the past few months until yesterday after 2 barbeque sandwiches. Eating healthier.   Gout - occasionally forgets allopurinol. No recent gout flares.   HTN - compliant with lisinopril 50mg  daily.   Chronic lower back pain - has had 4 shots total. Last one lasted only 1 wk Ronald Bullock). Leg numbness with ambulation.   DM - does not regularly check sugars. Compliant with antihyperglycemic regimen which includes: diet controlled. Avoids breads. Increased exercise recently. Denies hypoglycemic symptoms. Denies extremity paresthesias. Last diabetic eye exam DUE. Pneumovax: due. Prevnar: due. Glucometer brand: does not have. DSME: has not had.  Lab Results  Component Value Date   HGBA1C 6.5 08/30/2018   Diabetic Foot Exam - Simple   No data filed     No results found for: Ronald Bullock       Relevant past medical, surgical, family and social history reviewed and updated as indicated. Interim medical history since our last visit reviewed. Allergies and medications reviewed and updated. Outpatient  Medications Prior to Visit  Medication Sig Dispense Refill  . acetaminophen (TYLENOL) 500 MG tablet Take 500 mg by mouth 2 (two) times daily as needed.    Marland Kitchen allopurinol (ZYLOPRIM) 300 MG tablet TAKE 0.5 TABLETS (150 MG TOTAL) BY MOUTH DAILY. 45 tablet 2  . lisinopril (PRINIVIL,ZESTRIL) 5 MG tablet TAKE 1 TABLET BY MOUTH EVERY DAY 90 tablet 3  . meloxicam (MOBIC) 7.5 MG tablet TAKE 1 TABLET (7.5 MG TOTAL) BY MOUTH DAILY. AS NEEDED 90 tablet 1  . omeprazole (PRILOSEC) 40 MG capsule Take 1 capsule (40 mg total) by mouth 2 (two) times daily. 60 capsule 3   No facility-administered medications prior to visit.      Per HPI unless specifically indicated in ROS section below Review of Systems Objective:    Ht 6' 0.5" (1.842 m)   BMI 33.14 kg/m   Wt Readings from Last 3 Encounters:  09/01/18 247 lb 12 oz (112.4 kg)  08/30/18 243 lb 12 oz (110.6 kg)  08/04/18 243 lb 8 oz (110.5 kg)     Physical exam: Gen: alert, NAD, not ill appearing Pulm: speaks in complete sentences without increased work of breathing Psych: normal mood, normal thought content      Results for orders placed or performed in visit on 08/30/18  PSA, Medicare ([place])  Result Value Ref Range   PSA 1.15 0.10 - 4.00 ng/ml  TSH  Result Value Ref Range   TSH 3.29 0.35 - 4.50 uIU/mL  Comprehensive metabolic panel  Result  Value Ref Range   Sodium 139 135 - 145 mEq/L   Potassium 4.7 3.5 - 5.1 mEq/L   Chloride 103 96 - 112 mEq/L   CO2 29 19 - 32 mEq/L   Glucose, Bld 136 (H) 70 - 99 mg/dL   BUN 18 6 - 23 mg/dL   Creatinine, Ser 1.16 0.40 - 1.50 mg/dL   Total Bilirubin 0.5 0.2 - 1.2 mg/dL   Alkaline Phosphatase 60 39 - 117 U/L   AST 22 0 - 37 U/L   ALT 32 0 - 53 U/L   Total Protein 6.6 6.0 - 8.3 g/dL   Albumin 4.1 3.5 - 5.2 g/dL   Calcium 9.2 8.4 - 10.5 mg/dL   GFR 66.33 >60.00 mL/min  Uric acid  Result Value Ref Range   Uric Acid, Serum 7.6 4.0 - 7.8 mg/dL  Lipid Panel  Result Value Ref Range   Cholesterol 144  0 - 200 mg/dL   Triglycerides 216.0 (H) 0.0 - 149.0 mg/dL   HDL 31.20 (L) >39.00 mg/dL   VLDL 43.2 (H) 0.0 - 40.0 mg/dL   Total CHOL/HDL Ratio 5    NonHDL 112.70   Hemoglobin A1c  Result Value Ref Range   Hgb A1c MFr Bld 6.5 4.6 - 6.5 %  LDL cholesterol, direct  Result Value Ref Range   Direct LDL 87.0 mg/dL   Assessment & Plan:  RTC for fasting labs. RTC 6 mo OV.  Problem List Items Addressed This Visit    Lumbar stenosis with neurogenic claudication    Ongoing struggle with lower back pain. Considering surgical intervention later this year.       Indigestion    BID PPI worsened symptoms. Off PPI for the past 2 months. Stable period recently. Finds certain foods such as barbeque aggravate symptoms.       HLD (hyperlipidemia)    Chronic, not on statin. He has restarted fish oil tablets daily. Update FLP.       Relevant Orders   Lipid panel   Basic metabolic panel   Gout    Stable period on allopurinol, occasionally misses doses. No recent gout flare.       Essential hypertension    Tolerating lisinopril. Does not have bp cuff to check readings.       Controlled diabetes mellitus type 2 with complications ([place]) - Primary    Congratulated on healthy changes to date. Anticipate improved control with healthy diet changes and endorsed weight loss. Will have him come in for curbside labs then decide on further eval/treatment at that time. RTC 6 mo f/u visit.       Relevant Orders   Basic metabolic panel   Hemoglobin A1c       Meds ordered this encounter  Medications  . Omega-3 Fatty Acids (FISH OIL) 1200 MG CAPS    Sig: Take 1 capsule (1,200 mg total) by mouth 2 (two) times daily.   Orders Placed This Encounter  Procedures  . Lipid panel    Standing Status:   Future    Standing Expiration Date:   12/01/2019  . Basic metabolic panel    Standing Status:   Future    Standing Expiration Date:   12/01/2019  . Hemoglobin A1c    Standing Status:   Future    Standing  Expiration Date:   12/01/2019    Follow up plan: No follow-ups on file.  Ronald Bush, MD

## 2018-12-04 ENCOUNTER — Telehealth: Payer: Self-pay

## 2018-12-04 NOTE — Telephone Encounter (Signed)
Left detailed VM w COVID screen and curbside info   

## 2018-12-06 ENCOUNTER — Other Ambulatory Visit: Payer: Self-pay

## 2018-12-06 ENCOUNTER — Other Ambulatory Visit (INDEPENDENT_AMBULATORY_CARE_PROVIDER_SITE_OTHER): Payer: Medicare Other

## 2018-12-06 DIAGNOSIS — E785 Hyperlipidemia, unspecified: Secondary | ICD-10-CM | POA: Diagnosis not present

## 2018-12-06 DIAGNOSIS — E118 Type 2 diabetes mellitus with unspecified complications: Secondary | ICD-10-CM | POA: Diagnosis not present

## 2018-12-06 LAB — LIPID PANEL
Cholesterol: 133 mg/dL (ref 0–200)
HDL: 25.3 mg/dL — ABNORMAL LOW (ref >39.00)
LDL Cholesterol: 86 mg/dL (ref 0–99)
NonHDL: 107.85
Total CHOL/HDL Ratio: 5
Triglycerides: 110 mg/dL (ref 0.0–149.0)
VLDL: 22 mg/dL (ref 0.0–40.0)

## 2018-12-06 LAB — BASIC METABOLIC PANEL
BUN: 26 mg/dL — ABNORMAL HIGH (ref 6–23)
CO2: 27 mEq/L (ref 19–32)
Calcium: 8.6 mg/dL (ref 8.4–10.5)
Chloride: 104 mEq/L (ref 96–112)
Creatinine, Ser: 1.14 mg/dL (ref 0.40–1.50)
GFR: 63.62 mL/min (ref >60.00)
Glucose, Bld: 169 mg/dL — ABNORMAL HIGH (ref 70–99)
Potassium: 5 mEq/L (ref 3.5–5.1)
Sodium: 138 mEq/L (ref 135–145)

## 2018-12-06 LAB — HEMOGLOBIN A1C: Hgb A1c MFr Bld: 6.3 % (ref 4.6–6.5)

## 2018-12-30 ENCOUNTER — Encounter: Payer: Self-pay | Admitting: Family Medicine

## 2018-12-30 ENCOUNTER — Ambulatory Visit (INDEPENDENT_AMBULATORY_CARE_PROVIDER_SITE_OTHER): Payer: Medicare Other | Admitting: Family Medicine

## 2018-12-30 ENCOUNTER — Other Ambulatory Visit: Payer: Self-pay

## 2018-12-30 DIAGNOSIS — R42 Dizziness and giddiness: Secondary | ICD-10-CM | POA: Diagnosis not present

## 2018-12-30 DIAGNOSIS — I10 Essential (primary) hypertension: Secondary | ICD-10-CM

## 2018-12-30 MED ORDER — MECLIZINE HCL 12.5 MG PO TABS: 12.5000 mg | ORAL_TABLET | Freq: Three times a day (TID) | ORAL | 0 refills | Status: DC | PRN
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
## 2018-12-30 NOTE — Patient Instructions (Signed)
Vertigo    Vertigo means that you feel like you are moving when you are not. Vertigo can also make you feel like things around you are moving when they are not. This feeling can come and go at any time. Vertigo often goes away on its own.  Follow these instructions at home:  · Avoid making fast movements.  · Avoid driving.  · Avoid using heavy machinery.  · Avoid doing any task or activity that might cause danger to you or other people if you would have a vertigo attack while you are doing it.  · Sit down right away if you feel dizzy or have trouble with your balance.  · Take over-the-counter and prescription medicines only as told by your doctor.  · Follow instructions from your doctor about which positions or movements you should avoid.  · Drink enough fluid to keep your pee (urine) clear or pale yellow.  · Keep all follow-up visits as told by your doctor. This is important.  Contact a doctor if:  · Medicine does not help your vertigo.  · You have a fever.  · Your problems get worse or you have new symptoms.  · Your family or friends see changes in your behavior.  · You feel sick to your stomach (nauseous) or you throw up (vomit).  · You have a “pins and needles” feeling or you are numb in part of your body.  Get help right away if:  · You have trouble moving or talking.  · You are always dizzy.  · You pass out (faint).  · You get very bad headaches.  · You feel weak or have trouble using your hands, arms, or legs.  · You have changes in your hearing.  · You have changes in your seeing (vision).  · You get a stiff neck.  · Bright light starts to bother you.  This information is not intended to replace advice given to you by your health care provider. Make sure you discuss any questions you have with your health care provider.  Document Released: 05/11/2008 Document Revised: 01/08/2016 Document Reviewed: 11/25/2014  Elsevier Interactive Patient Education © 2019 Elsevier Inc.

## 2019-01-01 ENCOUNTER — Telehealth: Payer: Self-pay

## 2019-01-01 NOTE — Telephone Encounter (Signed)
Patient was seen on Saturday at our [facility] and dx with vertigo.  Please refer to ov encounter for details.

## 2019-01-01 NOTE — Telephone Encounter (Signed)
[facility] Patient Name: Ronald Bullock Gender: Male DOB: February 11, 1951 Age: 68 Y 59 M 17 D Return Phone Number: [phone] (Primary), [phone] (Secondary) Address: [address] [place] [address] Client [facility] Night - Client Client Site [place] Physician [name] - MD Contact Type Call Who Is Calling Patient / Member / Family / Caregiver Call Type Triage / Clinical Relationship To Patient Self Return Phone Number [phone] (Primary) Chief Complaint Dizziness Reason for Call Symptomatic / Request for [place] states his head is having a floating feeling after he rolled over in bed. His balance and equilibrium is off. Translation No Nurse Assessment Nurse: Leilani Merl, RN, Heather Date/Time (Eastern Time): 12/30/2018 7:22:46 AM Confirm and document reason for call. If symptomatic, describe symptoms. ---Caller states his head is having a floating feeling after he rolled over in bed. His balance and equilibrium is off. It started about 20 min ago. Has the patient had close contact with a person known or suspected to have the novel coronavirus illness OR traveled / lives in area with major community spread (including international travel) in the last 14 days from the onset of symptoms? * If Asymptomatic, screen for exposure and travel within the last 14 days. ---No Does the patient have any new or worsening symptoms? ---Yes Will a triage be completed? ---Yes Related visit to physician within the last 2 weeks? ---No Does the PT have any chronic conditions? (i.e. diabetes, asthma, this includes High risk factors for pregnancy, etc.) ---Yes List chronic conditions. ---HTN, sleep apnea, Is this a behavioral health or substance abuse call?  ---No Guidelines Guideline Title Affirmed Question Affirmed Notes Nurse Date/Time (Eastern Time) Dizziness - Vertigo [1] Dizziness (vertigo) present now AND [2] one or more stroke risk factors (i.e., hypertension, diabetes, prior stroke/ TIA, heart attack) Standifer, RN, Heather 12/30/2018 7:22:56 AM PLEASE NOTE: All timestamps contained within this report are represented as [place] Standard Time. CONFIDENTIALTY NOTICE: This fax transmission is intended only for the addressee. It contains information that is legally privileged, confidential or otherwise protected from use or disclosure. If you are not the intended recipient, you are strictly prohibited from reviewing, disclosing, copying using or disseminating any of this information or taking any action in reliance on or regarding this information. If you have received this fax in error, please notify us immediately by telephone so that we can arrange for its return to [place]. Phone: [phone], Toll-Free: [phone], Fax: [phone] Page: 2 of 2 Call Id: [id number] Guidelines Guideline Title Affirmed Question Affirmed Notes Nurse Date/Time Eilene Ghazi Time) (Exception: prior physician evaluation for this AND no different/ worse than usual) Disp. Time Eilene Ghazi Time) Disposition Final User 12/30/2018 7:28:24 AM Go to ED Now (or PCP triage) Yes Standifer, RN, Soyla Murphy Disagree/Comply Comply Caller Understands Yes PreDisposition Call Doctor Care Advice Given Per Guideline GO TO ED NOW (OR PCP TRIAGE): NOTE TO TRIAGER - DRIVING: * Another adult should drive. CARE ADVICE given per Dizziness - Vertigo (Adult) guideline. Referrals [facility]

## 2019-01-02 NOTE — Telephone Encounter (Signed)
Spoke with pt asking how he is doing. Says he is feeling a lot better and the meclizine helped, along with some exercises he was given in the past. Expresses his thanks for the call.

## 2019-01-02 NOTE — Telephone Encounter (Signed)
Note reviewed. plz call for update - let us know if symptoms not improving with meclizine for further evaluation.

## 2019-01-11 DIAGNOSIS — M48062 Spinal stenosis, lumbar region with neurogenic claudication: Secondary | ICD-10-CM | POA: Diagnosis not present

## 2019-01-19 ENCOUNTER — Ambulatory Visit: Payer: Self-pay | Admitting: *Deleted

## 2019-01-19 NOTE — Telephone Encounter (Signed)
Pt scheduled appt 01/22/19 at 9:45. ED precautions given. See appt notes.FYI to Dr Darnell Level.
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
## 2019-01-22 ENCOUNTER — Encounter: Payer: Self-pay | Admitting: Family Medicine

## 2019-01-22 ENCOUNTER — Ambulatory Visit (INDEPENDENT_AMBULATORY_CARE_PROVIDER_SITE_OTHER): Payer: Medicare Other | Admitting: Family Medicine

## 2019-01-22 ENCOUNTER — Other Ambulatory Visit: Payer: Self-pay

## 2019-01-22 VITALS — BP 140/72 | HR 79 | Temp 98.0°F | Ht 72.5 in | Wt 231.4 lb

## 2019-01-22 DIAGNOSIS — R42 Dizziness and giddiness: Secondary | ICD-10-CM | POA: Diagnosis not present

## 2019-01-22 NOTE — Assessment & Plan Note (Signed)
Vertigo story/exam most consistent with BPPV (peripheral vertigo). Continue PRN meclizine, will refer to vestibular rehab.  Not consistent with central cause.  If not responding to vestibular rehab, will consider head imaging.

## 2019-01-22 NOTE — Patient Instructions (Addendum)
I think this is benign paroxysmal positional vertigo. See below. We will refer you to PT for further treatment.   Benign Positional Vertigo Vertigo is the feeling that you or your surroundings are moving when they are not. Benign positional vertigo is the most common form of vertigo. This is usually a harmless condition (benign). This condition is positional. This means that symptoms are triggered by certain movements and positions. This condition can be dangerous if it occurs while you are doing something that could cause harm to you or others. This includes activities such as driving or operating machinery. What are the causes? In many cases, the cause of this condition is not known. It may be caused by a disturbance in an area of the inner ear that helps your brain to sense movement and balance. This disturbance can be caused by:  Viral infection (labyrinthitis).  Head injury.  Repetitive motion, such as jumping, dancing, or running. What increases the risk? You are more likely to develop this condition if:  You are a woman.  You are 70 years of age or older. What are the signs or symptoms? Symptoms of this condition usually happen when you move your head or your eyes in different directions. Symptoms may start suddenly, and usually last for less than a minute. They include:  Loss of balance and falling.  Feeling like you are spinning or moving.  Feeling like your surroundings are spinning or moving.  Nausea and vomiting.  Blurred vision.  Dizziness.  Involuntary eye movement (nystagmus). Symptoms can be mild and cause only minor problems, or they can be severe and interfere with daily life. Episodes of benign positional vertigo may return (recur) over time. Symptoms may improve over time. How is this diagnosed? This condition may be diagnosed based on:  Your medical history.  Physical exam of the head, neck, and ears.  Tests, such as: ? MRI. ? CT scan. ? Eye  movement tests. Your health care provider may ask you to change positions quickly while he or she watches you for symptoms of benign positional vertigo, such as nystagmus. Eye movement may be tested with a variety of exams that are designed to evaluate or stimulate vertigo. ? An electroencephalogram (EEG). This records electrical activity in your brain. ? Hearing tests. You may be referred to a health care provider who specializes in ear, nose, and throat (ENT) problems (otolaryngologist) or a provider who specializes in disorders of the nervous system (neurologist). How is this treated?  This condition may be treated in a session in which your health care provider moves your head in specific positions to adjust your inner ear back to normal. Treatment for this condition may take several sessions. Surgery may be needed in severe cases, but this is rare. In some cases, benign positional vertigo may resolve on its own in 2-4 weeks. Follow these instructions at home: Safety  Move slowly. Avoid sudden body or head movements or certain positions, as told by your health care provider.  Avoid driving until your health care provider says it is safe for you to do so.  Avoid operating heavy machinery until your health care provider says it is safe for you to do so.  Avoid doing any tasks that would be dangerous to you or others if vertigo occurs.  If you have trouble walking or keeping your balance, try using a cane for stability. If you feel dizzy or unstable, sit down right away.  Return to your normal activities as told  by your health care provider. Ask your health care provider what activities are safe for you. General instructions  Take over-the-counter and prescription medicines only as told by your health care provider.  Drink enough fluid to keep your urine pale yellow.  Keep all follow-up visits as told by your health care provider. This is important. Contact a health care provider  if:  You have a fever.  Your condition gets worse or you develop new symptoms.  Your family or friends notice any behavioral changes.  You have nausea or vomiting that gets worse.  You have numbness or a "pins and needles" sensation. Get help right away if you:  Have difficulty speaking or moving.  Are always dizzy.  Faint.  Develop severe headaches.  Have weakness in your legs or arms.  Have changes in your hearing or vision.  Develop a stiff neck.  Develop sensitivity to light. Summary  Vertigo is the feeling that you or your surroundings are moving when they are not. Benign positional vertigo is the most common form of vertigo.  The cause of this condition is not known. It may be caused by a disturbance in an area of the inner ear that helps your brain to sense movement and balance.  Symptoms include loss of balance and falling, feeling that you or your surroundings are moving, nausea and vomiting, and blurred vision.  This condition can be diagnosed based on symptoms, physical exam, and other tests, such as MRI, CT scan, eye movement tests, and hearing tests.  Follow safety instructions as told by your health care provider. You will also be told when to contact your health care provider in case of problems. This information is not intended to replace advice given to you by your health care provider. Make sure you discuss any questions you have with your health care provider. Document Released: 05/10/2006 Document Revised: 01/11/2018 Document Reviewed: 01/11/2018 Elsevier Interactive Patient Education  2019 [employer].
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
## 2019-01-30 ENCOUNTER — Encounter (HOSPITAL_COMMUNITY): Payer: Self-pay

## 2019-01-30 ENCOUNTER — Ambulatory Visit (HOSPITAL_COMMUNITY): Payer: Medicare Other

## 2019-01-30 ENCOUNTER — Ambulatory Visit (HOSPITAL_COMMUNITY)
Admission: EM | Admit: 2019-01-30 | Discharge: 2019-01-30 | Disposition: A | Discharge status: Home / Self Care | Payer: Medicare Other | Attending: Emergency Medicine | Admitting: Emergency Medicine

## 2019-01-30 ENCOUNTER — Other Ambulatory Visit: Payer: Self-pay

## 2019-01-30 ENCOUNTER — Ambulatory Visit (INDEPENDENT_AMBULATORY_CARE_PROVIDER_SITE_OTHER): Payer: Medicare Other

## 2019-01-30 DIAGNOSIS — M25511 Pain in right shoulder: Secondary | ICD-10-CM | POA: Diagnosis not present

## 2019-01-30 DIAGNOSIS — I10 Essential (primary) hypertension: Secondary | ICD-10-CM

## 2019-01-30 NOTE — ED Triage Notes (Signed)
Patient presents to Urgent Care with complaints of right shoulder pain that has been sore for a while, but yesterday while pulling a door closed he felt like he tweaked something in the shoulder joint. Patient reports he has arthritis and wonders if it is related.

## 2019-01-30 NOTE — Discharge Instructions (Signed)
Continue meloxicam, tylenol as needed  Follow up if pain persisting

## 2019-01-30 NOTE — ED Provider Notes (Signed)
[place]    CSN: [id number] Arrival date & time: 01/30/19  5462     History   Chief Complaint Chief Complaint  Patient presents with  . Shoulder Pain    HPI Ronald Bullock. is a 68 y.o. male history of hypertension, hyperlipidemia, osteoarthritis, DM type II, presenting today for evaluation of right shoulder pain.  Patient states that he has had issues with his shoulder off on on over the years. Yesterday he tried lifting/closing a door on a car yesterday and significantly worsening his shoulder pain.  He had difficulty raising his shoulder above 90 degrees, today is slightly better.  States that the pain feels deeper and mainly with movement, cannot reproduce his pain with touch.  Denies previous injury/surgery to this shoulder.  He has taken Tylenol as well as Mobic yesterday.  HPI  Past Medical History:  Diagnosis Date  . Arthritis    L end stage knee DKD, mild R knee DJD, R hip end stage DJD  . DDD (degenerative disc disease), cervical    multilevel  . Gout    rare flares  . HLD (hyperlipidemia)    Low HDL  . Hyperglycemia   . Hypertension   . Lumbar spinal stenosis 11/2014   severe central canal stenosis L3/4;L4/5 bilat facet hypertrophy, annular disc bulging, grade 1 sphondylolisthesis ([place]) planned PT and ESI Mina Marble)  . OSA on CPAP   . Osteoarthritis    R knee and hip, s/p L TKR    Patient Active Problem List   Diagnosis Date Noted  . Indigestion 08/04/2018  . Lumbar stenosis with neurogenic claudication 01/22/2017  . Advanced care planning/counseling discussion 08/20/2016  . Burning mouth syndrome 04/02/2016  . Right hand pain 03/08/2014  . DJD (degenerative joint disease) of hip 03/27/2013    Class: Chronic  . Left knee DJD 09/26/2012    Class: Chronic  . Ulnar neuropathy at wrist 07/11/2012  . Ulnar neuropathy at elbow 07/11/2012  . Vertigo 02/28/2012  . Right foot pain 07/23/2011  . HLD (hyperlipidemia)   . Osteoarthritis,  multiple sites   . OSA (obstructive sleep apnea)   . Obesity, Class I, BMI 30-34.9 09/30/2010  . PERIPHERAL EDEMA 05/12/2010  . Gout 09/24/2009  . ANKLE PAIN, LEFT 09/03/2008  . Essential hypertension 04/25/2007  . Controlled diabetes mellitus type 2 with complications ([place]) 70/35/0093    Past Surgical History:  Procedure Laterality Date  . cardiolyte  1997   WNL  . carotid US  1997   WNL  . CATARACT EXTRACTION W/PHACO Left 12/24/2014   Procedure: CATARACT EXTRACTION PHACO AND INTRAOCULAR LENS PLACEMENT (IOC);  Surgeon: Birder Robson, MD; [place] 01:16AP% 26.4Cde 20.17  . COLONOSCOPY  1998   WNL  . COLONOSCOPY  06/2010   WNL, internal hemorrhoids, rec rpt 10 yrs  . CT HEAD LIMITED W/CM  1997   WNL  . ESOPHAGOGASTRODUODENOSCOPY  1998   gastric polyps, benign  . JOINT REPLACEMENT  1968   Left Knee, cartilage removed  . KNEE ARTHROSCOPY  1968   cartilage removal  . LUMBAR EPIDURAL INJECTION  02/2017   L3-4 interlaminar injection Mina Marble)  . MRI lumbar  2000   mild bulge L3/4, mild foraminal narrowing L4/5, L5/S2, small disk herniation  . MRI lumbar  11/2014   severe L3/4, L4/5 central canal stenosis ([place])  . TOTAL HIP ARTHROPLASTY Right 03/27/2013   TOTAL HIP ARTHROPLASTY ANTERIOR APPROACH;  Surgeon: Hessie Dibble, MD  . TOTAL KNEE ARTHROPLASTY Left 09/26/2012  TOTAL KNEE ARTHROPLASTY;  Surgeon: Hessie Dibble, MD       Home Medications    Prior to Admission medications   Medication Sig Start Date End Date Taking? Authorizing Provider  allopurinol (ZYLOPRIM) 300 MG tablet TAKE 0.5 TABLETS (150 MG TOTAL) BY MOUTH DAILY. 08/31/18  Yes [name], MD  lisinopril (PRINIVIL,ZESTRIL) 5 MG tablet TAKE 1 TABLET BY MOUTH EVERY DAY 09/04/18  Yes [name], MD  meloxicam (MOBIC) 7.5 MG tablet TAKE 1 TABLET (7.5 MG TOTAL) BY MOUTH DAILY. AS NEEDED 11/03/18  Yes [name], MD  Omega-3 Fatty Acids (FISH OIL) 1200 MG CAPS Take 1 capsule (1,200 mg total) by  mouth 2 (two) times daily. 12/01/18  Yes [name], MD  acetaminophen (TYLENOL) 500 MG tablet Take 500 mg by mouth 2 (two) times daily as needed.    [provider]  meclizine (ANTIVERT) 12.5 MG tablet Take 1 tablet (12.5 mg total) by mouth 3 (three) times daily as needed for dizziness. 12/30/18   Billie Ruddy, MD    Family History Family History  Problem Relation Age of Onset  . Stroke Father 36  . Diabetes Father   . Asthma Mother   . Cancer Mother        Vaginal; radiation dz of bowel  . Obesity Sister     Social History Social History   Tobacco Use  . Smoking status: Never Smoker  . Smokeless tobacco: Never Used  Substance Use Topics  . Alcohol use: No    Comment: Rare  . Drug use: No     Allergies   Omeprazole   Review of Systems Review of Systems  Constitutional: Negative for fatigue and fever.  Eyes: Negative for redness, itching and visual disturbance.  Respiratory: Negative for shortness of breath.   Cardiovascular: Negative for chest pain and leg swelling.  Gastrointestinal: Negative for nausea and vomiting.  Musculoskeletal: Positive for arthralgias and myalgias.  Skin: Negative for color change, rash and wound.  Neurological: Negative for dizziness, syncope, weakness, light-headedness and headaches.     Physical Exam Triage Vital Signs ED Triage Vitals  Enc Vitals Group     BP 01/30/19 1019 (!) 145/71     Pulse Rate 01/30/19 1019 78     Resp 01/30/19 1019 18     Temp 01/30/19 1019 98.1 F (36.7 C)     Temp Source 01/30/19 1019 Oral     SpO2 01/30/19 1019 100 %     Weight --      Height --      Head Circumference --      Peak Flow --      Pain Score 01/30/19 1016 6     Pain Loc --      Pain Edu? --      Excl. in [place]? --    No data found.  Updated Vital Signs BP (!) 145/71 (BP Location: Left Arm)   Pulse 78   Temp 98.1 F (36.7 C) (Oral)   Resp 18   SpO2 100%   Visual Acuity Right Eye Distance:   Left Eye  Distance:   Bilateral Distance:    Right Eye Near:   Left Eye Near:    Bilateral Near:     Physical Exam Vitals signs and nursing note reviewed.  Constitutional:      Appearance: He is well-developed.  HENT:     Head: Normocephalic and atraumatic.  Eyes:     Conjunctiva/sclera: Conjunctivae normal.  Neck:  Musculoskeletal: Neck supple.  Cardiovascular:     Rate and Rhythm: Normal rate and regular rhythm.     Heart sounds: No murmur.  Pulmonary:     Effort: Pulmonary effort is normal. No respiratory distress.     Breath sounds: Normal breath sounds.  Abdominal:     Palpations: Abdomen is soft.     Tenderness: There is no abdominal tenderness.  Musculoskeletal:     Comments: Right shoulder: Nontender to palpation along the length of clavicle, AC joint and scapular spine, nontender throughout surrounding trapezius/upper thoracic musculature and into proximal arm Full active range of motion of shoulder although abduction above 90 does elicit pain Strength 5/5 and equal bilaterally in all directions Negative liftoff, negative resisted external rotation Slight weakness with Hawkins  Skin:    General: Skin is warm and dry.  Neurological:     Mental Status: He is alert.      UC Treatments / Results  Labs (all labs ordered are listed, but only abnormal results are displayed) Labs Reviewed - No data to display  EKG None  Radiology Dg Shoulder Right  Result Date: 01/30/2019 CLINICAL DATA:  Pt went to shut door on vehicle x yesterday, experienced right shoulder pain. Unable to turn key in car or shut car door. Pt has had a sore shoulder x years ago. Pt has no previous injury or surgery on right shoulder. EXAM: RIGHT SHOULDER - 2+ VIEW COMPARISON:  None. FINDINGS: No acute fracture or dislocation. Mild arthropathy of the acromioclavicular joint. Mild osteoarthritis of the glenohumeral joint. No aggressive osseous lesion. Soft tissues are normal. IMPRESSION: No acute osseous  injury of the right shoulder. Electronically Signed   By: Kathreen Devoid   On: 01/30/2019 11:49    Procedures Procedures (including critical care time)  Medications Ordered in UC Medications - No data to display  Initial Impression / Assessment and Plan / UC Course  I have reviewed the triage vital signs and the nursing notes.  Pertinent labs & imaging results that were available during my care of the patient were reviewed by me and considered in my medical decision making (see chart for details).     Do not suspect rotator cuff tear or fracture, possible underlying arthritis, tendonpathy or sprain. Mild arthritis noted in [facility] joint, and glenohumeral joint. Continue mobic, ice, rest, gentle ROM exercises. Follow up if not improving. Consider PT/ortho. Discussed strict return precautions. Patient verbalized understanding and is agreeable with plan.  Final Clinical Impressions(s) / UC Diagnoses   Final diagnoses:  Acute pain of right shoulder     Discharge Instructions     Continue meloxicam, tylenol as needed  Follow up if pain persisting     ED Prescriptions    None     Controlled Substance Prescriptions [place] Controlled Substance Registry consulted? Not Applicable   Janith Lima, [place] 01/30/19 1208

## 2019-01-31 ENCOUNTER — Other Ambulatory Visit: Payer: Self-pay

## 2019-01-31 ENCOUNTER — Ambulatory Visit: Payer: Medicare Other | Attending: Family Medicine | Admitting: Rehabilitative and Restorative Service Providers"

## 2019-01-31 DIAGNOSIS — R2689 Other abnormalities of gait and mobility: Secondary | ICD-10-CM | POA: Insufficient documentation

## 2019-01-31 DIAGNOSIS — R2681 Unsteadiness on feet: Secondary | ICD-10-CM

## 2019-01-31 DIAGNOSIS — R42 Dizziness and giddiness: Secondary | ICD-10-CM | POA: Diagnosis not present

## 2019-01-31 NOTE — Patient Instructions (Signed)
Access Code: [email or web address]  URL: [email or web address]  Date: 01/31/2019  Prepared by: Rudell Cobb   Exercises Standing Gaze Stabilization with Head Rotation - 1 reps - 1 sets - 3x daily - 7x weekly Romberg Stance Eyes Closed on Foam Pad - 3 reps - 1 sets - 30 seconds hold - 2x daily - 7x weekly

## 2019-01-31 NOTE — Therapy (Signed)
[facility] [address] [place], [place], [address] Phone: [phone]   Fax:  [phone]  Physical Therapy Evaluation  Patient Details  Name: Ronald Bullock. MRN: [id number] Date of Birth: March 11, 1951 Referring Provider (PT): [name], MD  CLINIC OPERATION CHANGES: Outpatient [facility] Rehab is open at lower capacity following universal masking, social distancing, and patient screening.  The patient's COVID risk of complications score is 5.  Encounter Date: 01/31/2019  PT End of Session - 01/31/19 1349    Visit Number  1    Number of Visits  4    Date for PT Re-Evaluation  03/17/19    Authorization Type  medicare    PT Start Time  1210    PT Stop Time  1300    PT Time Calculation (min)  50 min    Activity Tolerance  Patient tolerated treatment well    Behavior During Therapy  WFL for tasks assessed/performed       Past Medical History:  Diagnosis Date  . Arthritis    L end stage knee DKD, mild R knee DJD, R hip end stage DJD  . DDD (degenerative disc disease), cervical    multilevel  . Gout    rare flares  . HLD (hyperlipidemia)    Low HDL  . Hyperglycemia   . Hypertension   . Lumbar spinal stenosis 11/2014   severe central canal stenosis L3/4;L4/5 bilat facet hypertrophy, annular disc bulging, grade 1 sphondylolisthesis (Dumonski) planned PT and ESI Mina Marble)  . OSA on CPAP   . Osteoarthritis    R knee and hip, s/p L TKR    Past Surgical History:  Procedure Laterality Date  . cardiolyte  1997   WNL  . carotid US  1997   WNL  . CATARACT EXTRACTION W/PHACO Left 12/24/2014   Procedure: CATARACT EXTRACTION PHACO AND INTRAOCULAR LENS PLACEMENT (IOC);  Surgeon: Birder Robson, MD; [place] 01:16AP% 26.4Cde 20.17  . COLONOSCOPY  1998   WNL  . COLONOSCOPY  06/2010   WNL, internal hemorrhoids, rec rpt 10 yrs  . CT HEAD LIMITED W/CM  1997   WNL  . ESOPHAGOGASTRODUODENOSCOPY  1998   gastric polyps,  benign  . JOINT REPLACEMENT  1968   Left Knee, cartilage removed  . KNEE ARTHROSCOPY  1968   cartilage removal  . LUMBAR EPIDURAL INJECTION  02/2017   L3-4 interlaminar injection Mina Marble)  . MRI lumbar  2000   mild bulge L3/4, mild foraminal narrowing L4/5, L5/S2, small disk herniation  . MRI lumbar  11/2014   severe L3/4, L4/5 central canal stenosis (Dumonski)  . TOTAL HIP ARTHROPLASTY Right 03/27/2013   TOTAL HIP ARTHROPLASTY ANTERIOR APPROACH;  Surgeon: Hessie Dibble, MD  . TOTAL KNEE ARTHROPLASTY Left 09/26/2012   TOTAL KNEE ARTHROPLASTY;  Surgeon: Hessie Dibble, MD    There were no vitals filed for this visit.   Subjective Assessment - 01/31/19 1214    Subjective  The patient reports he saw me at our prior clinic 10 years ago.  The patient reports his vertigo was resolved.  He has just started sleeping on his right hand side and rolled to the left and had sudden onset of dizziness.  He did home exercises and it resolved, however a sensation of "heavy headedness" and had an "electrical shock" feeling in his head have continued.  He describes a "weighted" feeling in his head, but denies a true spinning.  He is taking meclizine and his last dose  was yesterday.    Pertinent History  diabetes, HTN, recent shoulder injury, h/o vertigo, gout    Patient Stated Goals  Determine if a scan is needed, Address heavy headed sensation.    Currently in Pain?  No/denies         [facility] PT Assessment - 01/31/19 1224      Assessment   Medical Diagnosis  Vertigo    Referring Provider (PT)  [name], MD    Onset Date/Surgical Date  --   approximately 4 weeks ago   Prior Therapy  at our clinic 10 years ago      Precautions   Precautions  None      Restrictions   Weight Bearing Restrictions  No      Balance Screen   Has the patient fallen in the past 6 months  No    Has the patient had a decrease in activity level because of a fear of falling?   No    Is the patient reluctant to  leave their home because of a fear of falling?   No      Home Environment   Living Environment  Private residence    Living Arrangements  Spouse/significant other    Type of [place] to enter    Entrance Stairs-Number of Steps  2    [place]  Two level    [place]  None      Prior Function   Level of Independence  Independent    Vocation  Full time employment      Ambulation/Gait   Ambulation/Gait  Yes    Ambulation/Gait Assistance  7: Independent    Ambulation Distance (Feet)  200 Feet    Assistive device  None    Gait Pattern  Within Functional Limits    Ambulation Surface  Level;Indoor    Stairs  Yes    Stairs Assistance  6: Modified independent (Device/Increase time)    Stair Management Technique  One rail Right;Alternating pattern    Number of Stairs  4      Functional Gait  Assessment   Gait assessed   Yes    Gait Level Surface  Walks 20 ft in less than 7 sec but greater than 5.5 sec, uses assistive device, slower speed, mild gait deviations, or deviates 6-10 in outside of the 12 in walkway width.    Change in Gait Speed  Able to smoothly change walking speed without loss of balance or gait deviation. Deviate no more than 6 in outside of the 12 in walkway width.    Gait with Horizontal Head Turns  Performs head turns smoothly with no change in gait. Deviates no more than 6 in outside 12 in walkway width    Gait with Vertical Head Turns  Performs head turns with no change in gait. Deviates no more than 6 in outside 12 in walkway width.    Gait and Pivot Turn  Pivot turns safely within 3 sec and stops quickly with no loss of balance.    Step Over Obstacle  Is able to step over 2 stacked shoe boxes taped together (9 in total height) without changing gait speed. No evidence of imbalance.    Gait with Narrow Base of Support  Ambulates 7-9 steps.    Gait with Eyes Closed  Walks 20 ft, no assistive devices, good speed, no evidence of imbalance,  normal gait pattern, deviates no more than 6  in outside 12 in walkway width. Ambulates 20 ft in less than 7 sec.    Ambulating Backwards  Walks 20 ft, no assistive devices, good speed, no evidence for imbalance, normal gait    Steps  Alternating feet, must use rail.    Total Score  27    FGA comment:  27/30           Vestibular Assessment - 01/31/19 1227      Vestibular Assessment   General Observation  The patient walks into clinic indep without a device. Some symptom of heaviness this morning lasting a moment or two.      Symptom Behavior   Subjective history of current problem  10 years ago had BPPV and was treated at our clinic.    Type of Dizziness   --   Pressure in his head, sensation of electrical shock   Frequency of Dizziness  estimates it occurs 1x every week    Duration of Dizziness  a moment    Symptom Nature  Intermittent    Aggravating Factors  Spontaneous onset   has noticed it when rolling in bed too   Relieving Factors  No known relieving factors    Progression of Symptoms  No change since onset    History of similar episodes  h/o BPPV      Oculomotor Exam   Oculomotor Alignment  Normal    Ocular ROM  WFLs    Spontaneous  Absent    Gaze-induced   Absent    Smooth Pursuits  Intact    Saccades  Intact      Vestibulo-Ocular Reflex   Comment  Head impulse test=refixation saccade during on rep (however he notes mask was moving up), others WNLs therefore think related to mask versus hypofunction.      Positional Testing   Dix-Hallpike  Dix-Hallpike Right;Dix-Hallpike Left    Sidelying Test  Sidelying Right;Sidelying Left    Horizontal Canal Testing  Horizontal Canal Right;Horizontal Canal Left      Dix-Hallpike Right   Dix-Hallpike Right Duration  none    Dix-Hallpike Right Symptoms  No nystagmus      Dix-Hallpike Left   Dix-Hallpike Left Duration  none    Dix-Hallpike Left Symptoms  No nystagmus      Sidelying Right   Sidelying Right Duration  none     Sidelying Right Symptoms  No nystagmus      Sidelying Left   Sidelying Left Duration  none    Sidelying Left Symptoms  No nystagmus      Horizontal Canal Right   Horizontal Canal Right Duration  none    Horizontal Canal Right Symptoms  Normal      Horizontal Canal Left   Horizontal Canal Left Duration  none    Horizontal Canal Left Symptoms  Normal      Cognition   Cognition Comment               Objective measurements completed on examination: See above findings.       Vestibular Treatment/Exercise - 01/31/19 1348      Vestibular Treatment/Exercise   Vestibular Treatment Provided  Gaze    Gaze Exercises  X1 Viewing Horizontal;X1 Viewing Vertical      X1 Viewing Horizontal   Foot Position  standing feet apart    Comments  30 seconds x 2 reps      X1 Viewing Vertical   Foot Position  standing feet apart    Comments  30  seconds x 2 reps                 PT Long Term Goals - 01/31/19 1349      PT LONG TERM GOAL #1   Title  The patient will subjectively report dec'd episodes of "heaviness" in his head.    Time  4    Period  Weeks    Target Date  03/17/19      PT LONG TERM GOAL #2   Title  The patient will tolerate HEP for high level balance and gaze adaptation.    Time  4    Period  Weeks    Target Date  03/02/19             Plan - 01/31/19 1251    Clinical Impression Statement  The patient is a 68 year old male with h/o prior BPPV + concussion approximately 10 years ago with a recent return in vestibular symptoms.  From patient's subjective reports, it appears he had 2 episodes of vertigo from BPPV (positional in nature and lasted x seconds) approximately 1 month ago. He treated with home exercise and it appears he was successful at clearing those symptoms due to negative positional testing today.  He is continuing with a vague report of intermittent heavy headed sensation that lasts for seconds to "moments".  Due to his h/o concussion + use  of vestibular supressants (took meclizine intermittently) his system may have "decompensated" for prior vestibular adaptation.  PT provided gaze x 1 exercise and multi-sensory balance training for HEP (foam with eyes closed) as this provoked a sense of heavyheadedness.  Recommended he write down when these instances occur to look for other patterns.  He inquired about need for imaging and PT recommended he give exercises time unless there is some change in status.  We also discussed that oculomotor screen is WNLs, which is a screen for CNS concerns.    Personal Factors and Comorbidities  Comorbidity 1;Comorbidity 3+;Comorbidity 2    Comorbidities  HTN, diabetes, h/o concussion    Examination-Activity Limitations  Locomotion Level    Examination-Participation Restrictions  Community Activity    Rehab Potential  Good    PT Frequency  1x / week    PT Duration  4 weeks    PT Treatment/Interventions  ADLs/Self Care Home Management;Neuromuscular re-education;Gait training;Stair training;Therapeutic activities;Therapeutic exercise;Balance training;Patient/family education;Canalith Repostioning;Vestibular    PT Next Visit Plan  check log for symptoms-- any further episodes?, check HEP, reassess based on symptology    Consulted and Agree with Plan of Care  Patient       Patient will benefit from skilled therapeutic intervention in order to improve the following deficits and impairments:  Dizziness, Decreased balance, Decreased activity tolerance  Visit Diagnosis: 1. Dizziness and giddiness   2. Unsteadiness on feet   3. Other abnormalities of gait and mobility        Problem List Patient Active Problem List   Diagnosis Date Noted  . Indigestion 08/04/2018  . Lumbar stenosis with neurogenic claudication 01/22/2017  . Advanced care planning/counseling discussion 08/20/2016  . Burning mouth syndrome 04/02/2016  . Right hand pain 03/08/2014  . DJD (degenerative joint disease) of hip 03/27/2013     Class: Chronic  . Left knee DJD 09/26/2012    Class: Chronic  . Ulnar neuropathy at wrist 07/11/2012  . Ulnar neuropathy at elbow 07/11/2012  . Vertigo 02/28/2012  . Right foot pain 07/23/2011  . HLD (hyperlipidemia)   . Osteoarthritis,  multiple sites   . OSA (obstructive sleep apnea)   . Obesity, Class I, BMI 30-34.9 09/30/2010  . PERIPHERAL EDEMA 05/12/2010  . Gout 09/24/2009  . ANKLE PAIN, LEFT 09/03/2008  . Essential hypertension 04/25/2007  . Controlled diabetes mellitus type 2 with complications ([place]) 50/08/6427    [place], PT 01/31/2019, 3:34 PM  [facility] [address] [place], [place], [address] Phone: [phone]   Fax:  [phone]  Name: Ronald Bullock. MRN: [id number] Date of Birth: 26-Jan-1951

## 2019-02-22 ENCOUNTER — Other Ambulatory Visit: Payer: Self-pay

## 2019-02-22 ENCOUNTER — Ambulatory Visit: Payer: Medicare Other | Attending: Family Medicine | Admitting: Rehabilitative and Restorative Service Providers"

## 2019-02-22 DIAGNOSIS — R42 Dizziness and giddiness: Secondary | ICD-10-CM

## 2019-02-22 DIAGNOSIS — R2689 Other abnormalities of gait and mobility: Secondary | ICD-10-CM | POA: Diagnosis not present

## 2019-02-22 DIAGNOSIS — R2681 Unsteadiness on feet: Secondary | ICD-10-CM | POA: Insufficient documentation

## 2019-02-22 NOTE — Therapy (Signed)
[facility] [address] [place], [place], [address] Phone: [phone]   Fax:  [phone]  Physical Therapy Treatment  Patient Details  Name: Ronald Bullock. MRN: [id number] Date of Birth: April 16, 1951 Referring Provider (PT): [name], MD  CLINIC OPERATION CHANGES: Outpatient Neuro Rehab is open at lower capacity following universal masking, social distancing, and patient screening.  The patient's COVID risk of complications score is 5.  Encounter Date: 02/22/2019  PT End of Session - 02/22/19 0709    Visit Number  2    Number of Visits  4    Date for PT Re-Evaluation  03/17/19    Authorization Type  medicare    PT Start Time  0705    PT Stop Time  0752    PT Time Calculation (min)  47 min    Activity Tolerance  Patient tolerated treatment well    Behavior During Therapy  [facility] for tasks assessed/performed       Past Medical History:  Diagnosis Date  . Arthritis    L end stage knee DKD, mild R knee DJD, R hip end stage DJD  . DDD (degenerative disc disease), cervical    multilevel  . Gout    rare flares  . HLD (hyperlipidemia)    Low HDL  . Hyperglycemia   . Hypertension   . Lumbar spinal stenosis 11/2014   severe central canal stenosis L3/4;L4/5 bilat facet hypertrophy, annular disc bulging, grade 1 sphondylolisthesis ([place]umonski) planned PT and ESI Mina Marble)  . OSA on CPAP   . Osteoarthritis    R knee and hip, s/p L TKR    Past Surgical History:  Procedure Laterality Date  . cardiolyte  1997   WNL  . carotid US  1997   WNL  . CATARACT EXTRACTION W/PHACO Left 12/24/2014   Procedure: CATARACT EXTRACTION PHACO AND INTRAOCULAR LENS PLACEMENT (IOC);  Surgeon: Birder Robson, MD; [place] 01:16AP% 26.4Cde 20.17  . COLONOSCOPY  1998   WNL  . COLONOSCOPY  06/2010   WNL, internal hemorrhoids, rec rpt 10 yrs  . CT HEAD LIMITED W/CM  1997   WNL  . ESOPHAGOGASTRODUODENOSCOPY  1998   gastric polyps,  benign  . JOINT REPLACEMENT  1968   Left Knee, cartilage removed  . KNEE ARTHROSCOPY  1968   cartilage removal  . LUMBAR EPIDURAL INJECTION  02/2017   L3-4 interlaminar injection Mina Marble)  . MRI lumbar  2000   mild bulge L3/4, mild foraminal narrowing L4/5, L5/S2, small disk herniation  . MRI lumbar  11/2014   severe L3/4, L4/5 central canal stenosis (Dumonski)  . TOTAL HIP ARTHROPLASTY Right 03/27/2013   TOTAL HIP ARTHROPLASTY ANTERIOR APPROACH;  Surgeon: Hessie Dibble, MD  . TOTAL KNEE ARTHROPLASTY Left 09/26/2012   TOTAL KNEE ARTHROPLASTY;  Surgeon: Hessie Dibble, MD    There were no vitals filed for this visit.  Subjective Assessment - 02/22/19 0707    Subjective  The patient reports he continues with some "heavyheadedness" on an almost daily basis.  He notes the worst day was after bending for minutes to clean a waterslide for his grandkids.  It kicked off heavyheadedness that lasted all day.  He notes he is having difficulty with the balance home exercises due to balance + numbness in his leg related to his back.  He reports "I just want to find out what the heck is going on."    Pertinent History  diabetes, HTN, recent shoulder injury, h/o vertigo,  gout    Patient Stated Goals  Determine if a scan is needed, Address heavy headed sensation.    Currently in Pain?  No/denies         [facility] PT Assessment - 02/22/19 0719      Functional Gait  Assessment   Gait assessed   Yes    Gait Level Surface  Walks 20 ft in less than 5.5 sec, no assistive devices, good speed, no evidence for imbalance, normal gait pattern, deviates no more than 6 in outside of the 12 in walkway width.    Change in Gait Speed  Able to smoothly change walking speed without loss of balance or gait deviation. Deviate no more than 6 in outside of the 12 in walkway width.    Gait with Horizontal Head Turns  Performs head turns smoothly with no change in gait. Deviates no more than 6 in outside 12 in walkway width     Gait with Vertical Head Turns  Performs head turns with no change in gait. Deviates no more than 6 in outside 12 in walkway width.    Gait and Pivot Turn  Pivot turns safely within 3 sec and stops quickly with no loss of balance.    Step Over Obstacle  Is able to step over 2 stacked shoe boxes taped together (9 in total height) without changing gait speed. No evidence of imbalance.    Gait with Narrow Base of Support  Is able to ambulate for 10 steps heel to toe with no staggering.    Gait with Eyes Closed  Walks 20 ft, no assistive devices, good speed, no evidence of imbalance, normal gait pattern, deviates no more than 6 in outside 12 in walkway width. Ambulates 20 ft in less than 7 sec.    Ambulating Backwards  Walks 20 ft, no assistive devices, good speed, no evidence for imbalance, normal gait    Steps  Alternating feet, must use rail.    Total Score  29    FGA comment:  29/30         Vestibular Assessment - 02/22/19 0724      Positional Testing   Sidelying Test  Sidelying Right;Sidelying Left      Sidelying Right   Sidelying Right Duration  None    Sidelying Right Symptoms  No nystagmus      Sidelying Left   Sidelying Left Duration  none    Sidelying Left Symptoms  No nystagmus      Orthostatics   BP supine (x 5 minutes)  151/77    BP standing (after 1 minute)  155/72    HR standing (after 1 minute)  57    BP standing (after 3 minutes)  160/78    HR standing (after 3 minutes)  67     Diagonal head motion reaching down to right and up to left x 5 reps and then reverse.  No symptoms.          [facility] Adult PT Treatment/Exercise - 02/22/19 0928      Self-Care   Self-Care  Other Self-Care Comments    Other Self-Care Comments   Discussed continued performance of HEP at this time until no longer challenging.        Vestibular Treatment/Exercise - 02/22/19 6415      Vestibular Treatment/Exercise   Vestibular Treatment Provided  Gaze;Habituation    Habituation  Exercises  Nestor Lewandowsky    Gaze Exercises  X1 Viewing Horizontal  Nestor Lewandowsky   Number of Reps   3    Symptom Description   Repeated to determine if heavyheadedness provoked with habituation      X1 Viewing Horizontal   Foot Position  standing feet apart    Comments  30 seconds with cues on speed and ROM amplitude                 PT Long Term Goals - 02/22/19 0739      PT LONG TERM GOAL #1   Title  The patient will subjectively report dec'd episodes of "heaviness" in his head.    Time  4    Period  Weeks    Status  Not Met      PT LONG TERM GOAL #2   Title  The patient will tolerate HEP for high level balance and gaze adaptation.    Time  4    Period  Weeks    Status  Achieved            Plan - 02/22/19 1540    Clinical Impression Statement  The patient reports his heavy headedness is more spontaneous in nature.  He is doing well with functional mobility.  He c/o occasional head pain/soreness on the right side.   He scores well on FGA (29/30), and can tolerate habituation activities in the clinic without provocation of symptoms.    PT Treatment/Interventions  ADLs/Self Care Home Management;Neuromuscular re-education;Gait training;Stair training;Therapeutic activities;Therapeutic exercise;Balance training;Patient/family education;Canalith Repostioning;Vestibular    PT Next Visit Plan  Discharge today with HEP to continue until 2 activities are easy for patient.    Consulted and Agree with Plan of Care  Patient       Patient will benefit from skilled therapeutic intervention in order to improve the following deficits and impairments:  Dizziness, Decreased balance, Decreased activity tolerance  Visit Diagnosis: 1. Dizziness and giddiness   2. Unsteadiness on feet   3. Other abnormalities of gait and mobility       PHYSICAL THERAPY DISCHARGE SUMMARY  Visits from Start of Care: 2  Current functional level related to goals / functional outcomes: See  above   Remaining deficits: The patient continues to c/o heaviness in his head and intermittent sharp pain.   Education / Equipment: Home program for high level balance and vor x 1 viewing.  Plan: Patient agrees to discharge.  Patient goals were partially met. Patient is being discharged due to meeting the stated rehab goals.  ?????         Thank you for the referral of this patient. Rudell Cobb, MPT   [place], [place] 02/22/2019, 9:31 AM  [facility] [address] [place], [place], [address] Phone: [phone]   Fax:  [phone]  Name: Ronald Bullock. MRN: [id number] Date of Birth: 01-31-1951

## 2019-02-28 DIAGNOSIS — M542 Cervicalgia: Secondary | ICD-10-CM | POA: Diagnosis not present

## 2019-02-28 DIAGNOSIS — M545 Low back pain: Secondary | ICD-10-CM | POA: Diagnosis not present

## 2019-02-28 DIAGNOSIS — M5412 Radiculopathy, cervical region: Secondary | ICD-10-CM | POA: Diagnosis not present

## 2019-03-08 DIAGNOSIS — M542 Cervicalgia: Secondary | ICD-10-CM | POA: Diagnosis not present

## 2019-03-12 DIAGNOSIS — M545 Low back pain: Secondary | ICD-10-CM | POA: Diagnosis not present

## 2019-04-02 ENCOUNTER — Telehealth: Payer: Self-pay | Admitting: Rehabilitative and Restorative Service Providers"

## 2019-04-02 NOTE — Telephone Encounter (Signed)
The patient called to report that vertigo came back Saturday night.  He woke up on his right side and rolled to the left side and felt the room spinning (when he was asleep per report).  When he woke Sunday he had a "heavyheadedness" described as "oil and water thing, not spinning."   It is occurring at rest at this time, not just with movement.  PT recommended he f/u with MD.   Rudell Cobb, PT

## 2019-04-03 ENCOUNTER — Encounter: Payer: Self-pay | Admitting: Family Medicine

## 2019-04-03 ENCOUNTER — Other Ambulatory Visit: Payer: Self-pay

## 2019-04-03 ENCOUNTER — Ambulatory Visit (INDEPENDENT_AMBULATORY_CARE_PROVIDER_SITE_OTHER): Payer: Medicare Other | Admitting: Family Medicine

## 2019-04-03 VITALS — BP 120/60 | HR 78 | Temp 97.9°F | Ht 72.5 in | Wt 241.1 lb

## 2019-04-03 DIAGNOSIS — H814 Vertigo of central origin: Secondary | ICD-10-CM

## 2019-04-03 DIAGNOSIS — R42 Dizziness and giddiness: Secondary | ICD-10-CM | POA: Diagnosis not present

## 2019-04-03 NOTE — Patient Instructions (Addendum)
We will schedule you for brain MRI and if ongoing unsteadiness will refer you back to the neurologist for further evaluation.
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
## 2019-04-05 ENCOUNTER — Other Ambulatory Visit: Payer: Self-pay

## 2019-04-05 ENCOUNTER — Ambulatory Visit (HOSPITAL_COMMUNITY)
Admission: RE | Admit: 2019-04-05 | Discharge: 2019-04-05 | Disposition: A | Discharge status: Home / Self Care | Payer: Medicare Other | Source: Ambulatory Visit | Attending: Family Medicine | Admitting: Family Medicine

## 2019-04-05 DIAGNOSIS — H814 Vertigo of central origin: Secondary | ICD-10-CM | POA: Diagnosis not present

## 2019-04-05 DIAGNOSIS — R42 Dizziness and giddiness: Secondary | ICD-10-CM | POA: Diagnosis not present

## 2019-04-27 ENCOUNTER — Other Ambulatory Visit: Payer: Self-pay | Admitting: Family Medicine

## 2019-04-27 DIAGNOSIS — M67911 Unspecified disorder of synovium and tendon, right shoulder: Secondary | ICD-10-CM | POA: Diagnosis not present

## 2019-05-07 ENCOUNTER — Other Ambulatory Visit: Payer: Self-pay

## 2019-05-07 ENCOUNTER — Encounter: Payer: Self-pay | Admitting: Neurology

## 2019-05-07 ENCOUNTER — Ambulatory Visit (INDEPENDENT_AMBULATORY_CARE_PROVIDER_SITE_OTHER): Payer: Medicare Other | Admitting: Neurology

## 2019-05-07 VITALS — BP 142/75 | HR 69 | Temp 97.1°F | Ht 72.5 in | Wt 241.5 lb

## 2019-05-07 DIAGNOSIS — I679 Cerebrovascular disease, unspecified: Secondary | ICD-10-CM | POA: Diagnosis not present

## 2019-05-07 DIAGNOSIS — M25511 Pain in right shoulder: Secondary | ICD-10-CM | POA: Diagnosis not present

## 2019-05-07 DIAGNOSIS — G8929 Other chronic pain: Secondary | ICD-10-CM | POA: Insufficient documentation
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
## 2019-05-11 DIAGNOSIS — M75121 Complete rotator cuff tear or rupture of right shoulder, not specified as traumatic: Secondary | ICD-10-CM | POA: Diagnosis not present

## 2019-05-17 HISTORY — PX: SHOULDER SURGERY: SHX246

## 2019-05-31 ENCOUNTER — Other Ambulatory Visit: Payer: Self-pay | Admitting: Family Medicine

## 2019-06-05 DIAGNOSIS — M7542 Impingement syndrome of left shoulder: Secondary | ICD-10-CM | POA: Diagnosis not present

## 2019-06-05 DIAGNOSIS — M7541 Impingement syndrome of right shoulder: Secondary | ICD-10-CM | POA: Diagnosis not present

## 2019-06-05 DIAGNOSIS — Y999 Unspecified external cause status: Secondary | ICD-10-CM | POA: Diagnosis not present

## 2019-06-05 DIAGNOSIS — G8918 Other acute postprocedural pain: Secondary | ICD-10-CM | POA: Diagnosis not present

## 2019-06-05 DIAGNOSIS — X58XXXA Exposure to other specified factors, initial encounter: Secondary | ICD-10-CM | POA: Diagnosis not present

## 2019-06-05 DIAGNOSIS — S46811A Strain of other muscles, fascia and tendons at shoulder and upper arm level, right arm, initial encounter: Secondary | ICD-10-CM | POA: Diagnosis not present

## 2019-06-05 DIAGNOSIS — S46011A Strain of muscle(s) and tendon(s) of the rotator cuff of right shoulder, initial encounter: Secondary | ICD-10-CM | POA: Diagnosis not present

## 2019-06-14 DIAGNOSIS — Z4789 Encounter for other orthopedic aftercare: Secondary | ICD-10-CM | POA: Diagnosis not present

## 2019-06-14 DIAGNOSIS — M75121 Complete rotator cuff tear or rupture of right shoulder, not specified as traumatic: Secondary | ICD-10-CM | POA: Diagnosis not present

## 2019-06-20 DIAGNOSIS — M75121 Complete rotator cuff tear or rupture of right shoulder, not specified as traumatic: Secondary | ICD-10-CM | POA: Diagnosis not present

## 2019-06-20 DIAGNOSIS — Z4789 Encounter for other orthopedic aftercare: Secondary | ICD-10-CM | POA: Diagnosis not present

## 2019-06-22 DIAGNOSIS — M25521 Pain in right elbow: Secondary | ICD-10-CM | POA: Diagnosis not present

## 2019-06-26 DIAGNOSIS — M75121 Complete rotator cuff tear or rupture of right shoulder, not specified as traumatic: Secondary | ICD-10-CM | POA: Diagnosis not present

## 2019-06-26 DIAGNOSIS — Z4789 Encounter for other orthopedic aftercare: Secondary | ICD-10-CM | POA: Diagnosis not present

## 2019-07-03 DIAGNOSIS — M75121 Complete rotator cuff tear or rupture of right shoulder, not specified as traumatic: Secondary | ICD-10-CM | POA: Diagnosis not present

## 2019-07-03 DIAGNOSIS — Z4789 Encounter for other orthopedic aftercare: Secondary | ICD-10-CM | POA: Diagnosis not present

## 2019-07-07 ENCOUNTER — Other Ambulatory Visit: Payer: Self-pay

## 2019-07-07 DIAGNOSIS — Z20822 Contact with and (suspected) exposure to covid-19: Secondary | ICD-10-CM

## 2019-07-09 ENCOUNTER — Encounter: Payer: Self-pay | Admitting: Internal Medicine

## 2019-07-09 ENCOUNTER — Other Ambulatory Visit: Payer: Self-pay

## 2019-07-09 ENCOUNTER — Telehealth: Payer: Self-pay

## 2019-07-09 ENCOUNTER — Ambulatory Visit (INDEPENDENT_AMBULATORY_CARE_PROVIDER_SITE_OTHER): Payer: Medicare Other | Admitting: Internal Medicine

## 2019-07-09 DIAGNOSIS — M10031 Idiopathic gout, right wrist: Secondary | ICD-10-CM | POA: Diagnosis not present

## 2019-07-09 DIAGNOSIS — M109 Gout, unspecified: Secondary | ICD-10-CM | POA: Insufficient documentation

## 2019-07-09 LAB — NOVEL CORONAVIRUS, NAA: SARS-CoV-2, NAA: NOT DETECTED

## 2019-07-09 MED ORDER — COLCHICINE 0.6 MG PO TABS: 0.6000 mg | ORAL_TABLET | Freq: Two times a day (BID) | ORAL | 1 refills | Status: DC | PRN

## 2019-07-09 NOTE — Progress Notes (Signed)
Subjective:    Patient ID: Ronald Bullock., male    DOB: Oct 25, 1950, 68 y.o.   MRN: [id number]  HPI Here due to apparent gout flare  This visit occurred during the SARS-CoV-2 public health emergency.  Safety protocols were in place, including screening questions prior to the visit, additional usage of staff PPE, and extensive cleaning of exam room while observing appropriate contact time as indicated for disinfecting solutions.    Had rotator cuff surgery in October In sling during the day Noticed swelling from right wrist down to fingers 2 days ago Typical for his gout (has happened after past surgery--but usually closer to the surgery) May have missed allopurinol once--but is pretty good about it  Current Outpatient Medications on File Prior to Visit  Medication Sig Dispense Refill  . acetaminophen (TYLENOL) 500 MG tablet Take 500 mg by mouth 2 (two) times daily as needed.    Marland Kitchen allopurinol (ZYLOPRIM) 300 MG tablet TAKE HALF TABLET BY MOUTH BY MOUTH DAILY. 45 tablet 2  . lisinopril (PRINIVIL,ZESTRIL) 5 MG tablet TAKE 1 TABLET BY MOUTH EVERY DAY 90 tablet 3  . Omega-3 Fatty Acids (FISH OIL) 1200 MG CAPS Take 1 capsule (1,200 mg total) by mouth 2 (two) times daily.     No current facility-administered medications on file prior to visit.     Allergies  Allergen Reactions  . Omeprazole     Vomiting/diarrhea on BID dosing    Past Medical History:  Diagnosis Date  . Arthritis    L end stage knee DKD, mild R knee DJD, R hip end stage DJD  . DDD (degenerative disc disease), cervical    multilevel  . Gout    rare flares  . HLD (hyperlipidemia)    Low HDL  . Hyperglycemia   . Hypertension   . Lumbar spinal stenosis 11/2014   severe central canal stenosis L3/4;L4/5 bilat facet hypertrophy, annular disc bulging, grade 1 sphondylolisthesis (Dumonski) planned PT and ESI Mina Marble)  . OSA on CPAP   . Osteoarthritis    R knee and hip, s/p L TKR    Past Surgical History:   Procedure Laterality Date  . cardiolyte  1997   WNL  . carotid US  1997   WNL  . CATARACT EXTRACTION W/PHACO Left 12/24/2014   Procedure: CATARACT EXTRACTION PHACO AND INTRAOCULAR LENS PLACEMENT (IOC);  Surgeon: Birder Robson, MD; [place] 01:16AP% 26.4Cde 20.17  . COLONOSCOPY  1998   WNL  . COLONOSCOPY  06/2010   WNL, internal hemorrhoids, rec rpt 10 yrs  . CT HEAD LIMITED W/CM  1997   WNL  . ESOPHAGOGASTRODUODENOSCOPY  1998   gastric polyps, benign  . JOINT REPLACEMENT  1968   Left Knee, cartilage removed  . KNEE ARTHROSCOPY  1968   cartilage removal  . LUMBAR EPIDURAL INJECTION  02/2017   L3-4 interlaminar injection Mina Marble)  . MRI lumbar  2000   mild bulge L3/4, mild foraminal narrowing L4/5, L5/S2, small disk herniation  . MRI lumbar  11/2014   severe L3/4, L4/5 central canal stenosis (Dumonski)  . TOTAL HIP ARTHROPLASTY Right 03/27/2013   TOTAL HIP ARTHROPLASTY ANTERIOR APPROACH;  Surgeon: Hessie Dibble, MD  . TOTAL KNEE ARTHROPLASTY Left 09/26/2012   TOTAL KNEE ARTHROPLASTY;  Surgeon: Hessie Dibble, MD    Family History  Problem Relation Age of Onset  . Stroke Father 60  . Diabetes Father   . Asthma Mother   . Cancer Mother  Vaginal; radiation dz of bowel  . Obesity Sister     Social History   Socioeconomic History  . Marital status: Married    Spouse name: Not on file  . Number of children: 4  . Years of education: college  . Highest education level: Not on file  Occupational History  . 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 Financial resource strain: Not on file  . Food insecurity    Worry: Not on file    Inability: Not on file  . Transportation needs    Medical: Not on file    Non-medical: Not on file  Tobacco Use  . Smoking status: Never Smoker  . Smokeless tobacco: Never Used  Substance and Sexual Activity  . Alcohol use: No    Comment: Rare  . Drug use: No  . Sexual activity: Never   Lifestyle  . Physical activity    Days per week: Not on file    Minutes per session: Not on file  . Stress: Not on file  Relationships  . Social Herbalist on phone: Not on file    Gets together: Not on file    Attends religious service: Not on file    Active member of club or organization: Not on file    Attends meetings of clubs or organizations: Not on file    Relationship status: Not on file  . Intimate partner violence    Fear of current or ex partner: Not on file    Emotionally abused: Not on file    Physically abused: Not on file    Forced sexual activity: Not on file  Other Topics Concern  . Not on file  Social History Narrative   Lives with wife, has grown son   Activity: no regular exercise   Diet: good water, some fish, good vegetables, red meat 3x/wk   Right-handed.   Two 32-ounce glasses of tea.   Review of Systems No changes in diet No fever    Objective:   Physical Exam  Constitutional: He appears well-developed. No distress.  Musculoskeletal:     Comments: Synovial swelling and tenderness (and warmth) of right wrist MCPs and fingers are fairly quiet           Assessment & Plan:

## 2019-07-09 NOTE — Assessment & Plan Note (Signed)
Classic presentation for him Is on the allopurinol---uric acid level was 7.6. might want to consider increasing the allopurinol if it stays that high Will prescribe colchicine--has worked in the past If that doesn't work, would try prednisone burst

## 2019-07-09 NOTE — Telephone Encounter (Signed)
I spoke with pt; for 2 days had pain in  Rt hand with gout; hand is swollen and warm and tight; every time pt has surgery gets gout and pt had surgery 3 wks ago. Pt has no covid symptoms, no travel and no known exposure to + covid. Pt said his son had possibly been exposed to covid and pts son and pt went for covid testing over the weekend; pts son got his result back which was neg.pt has not gotten his result back yet but reason for him being tested was because he was around his son and the son is neg.pt scheduled in office appt today at 4 pm with Dr Silvio Pate.

## 2019-07-09 NOTE — Telephone Encounter (Signed)
Okay In person visit is the best bet with possible gout

## 2019-07-10 ENCOUNTER — Telehealth: Payer: Self-pay | Admitting: Internal Medicine

## 2019-07-10 NOTE — Telephone Encounter (Signed)
Patient called.  Patient was seen yesterday for gout by Dr.Letvak.  Dr.Letvak sent a rx for Colchicine to [facility].  Patient went to pick up the rx and his insurance won't cover it and it was going to be $265.  Patient wants to know if a smaller amount of pills or something else could be called in to the pharmacy.

## 2019-07-10 NOTE — Telephone Encounter (Signed)
Spoke to pt. Advised him that usually they cover a name brand. He said the pharmacy told him that but did not look in to it to see what they covered. I will contact them later today.

## 2019-07-10 NOTE — Telephone Encounter (Signed)
Spoke to [place] at [facility]. She said the pt did not want either Name Brand or generic because they all were over $300. I looked it up on [facility] and they have 30 tablets for $65 at [facility]. Called pt and left a message per DPR about goodRx.

## 2019-07-17 ENCOUNTER — Other Ambulatory Visit: Payer: Self-pay | Admitting: Internal Medicine

## 2019-07-17 DIAGNOSIS — M75121 Complete rotator cuff tear or rupture of right shoulder, not specified as traumatic: Secondary | ICD-10-CM | POA: Diagnosis not present

## 2019-07-17 DIAGNOSIS — Z4789 Encounter for other orthopedic aftercare: Secondary | ICD-10-CM | POA: Diagnosis not present

## 2019-07-19 DIAGNOSIS — M75121 Complete rotator cuff tear or rupture of right shoulder, not specified as traumatic: Secondary | ICD-10-CM | POA: Diagnosis not present

## 2019-07-19 DIAGNOSIS — Z4789 Encounter for other orthopedic aftercare: Secondary | ICD-10-CM | POA: Diagnosis not present

## 2019-07-25 ENCOUNTER — Other Ambulatory Visit: Payer: Self-pay | Admitting: Internal Medicine

## 2019-08-05 DIAGNOSIS — Z20828 Contact with and (suspected) exposure to other viral communicable diseases: Secondary | ICD-10-CM | POA: Diagnosis not present

## 2019-08-21 DIAGNOSIS — Z4789 Encounter for other orthopedic aftercare: Secondary | ICD-10-CM | POA: Diagnosis not present

## 2019-08-21 DIAGNOSIS — M75121 Complete rotator cuff tear or rupture of right shoulder, not specified as traumatic: Secondary | ICD-10-CM | POA: Diagnosis not present

## 2019-08-26 ENCOUNTER — Other Ambulatory Visit: Payer: Self-pay | Admitting: Family Medicine

## 2019-08-31 DIAGNOSIS — M75121 Complete rotator cuff tear or rupture of right shoulder, not specified as traumatic: Secondary | ICD-10-CM | POA: Diagnosis not present

## 2019-08-31 DIAGNOSIS — Z4789 Encounter for other orthopedic aftercare: Secondary | ICD-10-CM | POA: Diagnosis not present

## 2019-09-04 ENCOUNTER — Other Ambulatory Visit: Payer: Self-pay | Admitting: Family Medicine

## 2019-09-04 DIAGNOSIS — Z125 Encounter for screening for malignant neoplasm of prostate: Secondary | ICD-10-CM

## 2019-09-04 DIAGNOSIS — M75121 Complete rotator cuff tear or rupture of right shoulder, not specified as traumatic: Secondary | ICD-10-CM | POA: Diagnosis not present

## 2019-09-04 DIAGNOSIS — Z4789 Encounter for other orthopedic aftercare: Secondary | ICD-10-CM | POA: Diagnosis not present

## 2019-09-04 DIAGNOSIS — E118 Type 2 diabetes mellitus with unspecified complications: Secondary | ICD-10-CM

## 2019-09-04 DIAGNOSIS — E785 Hyperlipidemia, unspecified: Secondary | ICD-10-CM

## 2019-09-04 DIAGNOSIS — M1A00X0 Idiopathic chronic gout, unspecified site, without tophus (tophi): Secondary | ICD-10-CM

## 2019-09-05 ENCOUNTER — Ambulatory Visit: Payer: Medicare Other

## 2019-09-05 ENCOUNTER — Other Ambulatory Visit: Payer: Self-pay

## 2019-09-05 ENCOUNTER — Other Ambulatory Visit (INDEPENDENT_AMBULATORY_CARE_PROVIDER_SITE_OTHER): Payer: Medicare Other

## 2019-09-05 ENCOUNTER — Ambulatory Visit (INDEPENDENT_AMBULATORY_CARE_PROVIDER_SITE_OTHER): Payer: Medicare Other

## 2019-09-05 VITALS — BP 134/77 | Wt 240.0 lb

## 2019-09-05 DIAGNOSIS — E118 Type 2 diabetes mellitus with unspecified complications: Secondary | ICD-10-CM

## 2019-09-05 DIAGNOSIS — Z0000 Encounter for general adult medical examination without abnormal findings: Secondary | ICD-10-CM | POA: Diagnosis not present

## 2019-09-05 DIAGNOSIS — E785 Hyperlipidemia, unspecified: Secondary | ICD-10-CM

## 2019-09-05 DIAGNOSIS — Z125 Encounter for screening for malignant neoplasm of prostate: Secondary | ICD-10-CM

## 2019-09-05 DIAGNOSIS — M1A00X0 Idiopathic chronic gout, unspecified site, without tophus (tophi): Secondary | ICD-10-CM | POA: Diagnosis not present

## 2019-09-05 LAB — COMPREHENSIVE METABOLIC PANEL
ALT: 29 U/L (ref 0–53)
AST: 22 U/L (ref 0–37)
Albumin: 4 g/dL (ref 3.5–5.2)
Alkaline Phosphatase: 63 U/L (ref 39–117)
BUN: 20 mg/dL (ref 6–23)
CO2: 28 mEq/L (ref 19–32)
Calcium: 8.8 mg/dL (ref 8.4–10.5)
Chloride: 102 mEq/L (ref 96–112)
Creatinine, Ser: 1.02 mg/dL (ref 0.40–1.50)
GFR: 72.17 mL/min (ref >60.00)
Glucose, Bld: 133 mg/dL — ABNORMAL HIGH (ref 70–99)
Potassium: 4.7 mEq/L (ref 3.5–5.1)
Sodium: 137 mEq/L (ref 135–145)
Total Bilirubin: 0.4 mg/dL (ref 0.2–1.2)
Total Protein: 6.6 g/dL (ref 6.0–8.3)

## 2019-09-05 LAB — HEMOGLOBIN A1C: Hgb A1c MFr Bld: 6.6 % — ABNORMAL HIGH (ref 4.6–6.5)

## 2019-09-05 LAB — LIPID PANEL
Cholesterol: 162 mg/dL (ref 0–200)
HDL: 31.8 mg/dL — ABNORMAL LOW (ref >39.00)
LDL Cholesterol: 93 mg/dL (ref 0–99)
NonHDL: 130.35
Total CHOL/HDL Ratio: 5
Triglycerides: 185 mg/dL — ABNORMAL HIGH (ref 0.0–149.0)
VLDL: 37 mg/dL (ref 0.0–40.0)

## 2019-09-05 LAB — URIC ACID: Uric Acid, Serum: 7.7 mg/dL (ref 4.0–7.8)

## 2019-09-05 LAB — PSA, MEDICARE: PSA: 0.84 ng/ml (ref 0.10–4.00)

## 2019-09-05 NOTE — Progress Notes (Signed)
Subjective:   Bernis Neave. is a 69 y.o. male who presents for Medicare Annual/Subsequent preventive examination.  Review of Systems: N/A   This visit is being conducted through telemedicine via telephone at the nurse health advisor's home address due to the COVID-19 pandemic. This patient has given me verbal consent via doximity to conduct this visit, patient states they are participating from their home address. Patient and myself are on the telephone call. There is no referral for this visit. Some vital signs may be absent or patient reported.    Patient identification: identified by name, DOB, and current address   Cardiac Risk Factors include: advanced age (>20men, >32 women);diabetes mellitus;dyslipidemia;hypertension;male gender     Objective:    Vitals: BP 134/77   Wt 240 lb (108.9 kg)   BMI 32.10 kg/m   Body mass index is 32.1 kg/m.  Advanced Directives 09/05/2019 08/30/2018 08/25/2017 08/04/2016 12/24/2014 03/28/2013 03/19/2013  Does Patient Have a Medical Advance Directive? Yes Yes No No No Patient has advance directive, copy not in chart Patient has advance directive, copy in chart  Type of Advance Directive [place];Living will [place];Living will  Copy of [place] in Chart? Yes - validated most recent copy scanned in chart (See row information) No - copy requested - - - Copy requested from other (Comment) -  Would patient like information on creating a medical advance directive? - - No - Patient declined - - - -  Pre-existing out of facility DNR order (yellow form or pink MOST form) - - - - - - No    Tobacco Social History   Tobacco Use  Smoking Status Never Smoker  Smokeless Tobacco Never Used     Counseling given: Not Answered   Clinical Intake:  Pre-visit preparation completed: Yes  Pain : No/denies pain     Nutritional Risks: None Diabetes:  Yes CBG done?: No Did pt. bring in CBG monitor from home?: No  How often do you need to have someone help you when you read instructions, pamphlets, or other written materials from your doctor or pharmacy?: 1 - Never What is the last grade level you completed in school?: college  Interpreter Needed?: No  Information entered by :: CJohnson, LPN  Past Medical History:  Diagnosis Date  . Arthritis    L end stage knee DKD, mild R knee DJD, R hip end stage DJD  . DDD (degenerative disc disease), cervical    multilevel  . Gout    rare flares  . HLD (hyperlipidemia)    Low HDL  . Hyperglycemia   . Hypertension   . Lumbar spinal stenosis 11/2014   severe central canal stenosis L3/4;L4/5 bilat facet hypertrophy, annular disc bulging, grade 1 sphondylolisthesis (Dumonski) planned PT and ESI Mina Marble)  . OSA on CPAP   . Osteoarthritis    R knee and hip, s/p L TKR   Past Surgical History:  Procedure Laterality Date  . cardiolyte  1997   WNL  . carotid US  1997   WNL  . CATARACT EXTRACTION W/PHACO Left 12/24/2014   Procedure: CATARACT EXTRACTION PHACO AND INTRAOCULAR LENS PLACEMENT (IOC);  Surgeon: Birder Robson, MD; [place] 01:16AP% 26.4Cde 20.17  . COLONOSCOPY  1998   WNL  . COLONOSCOPY  06/2010   WNL, internal hemorrhoids, rec rpt 10 yrs  . CT HEAD LIMITED W/CM  1997   WNL  . ESOPHAGOGASTRODUODENOSCOPY  1998   gastric polyps, benign  . JOINT REPLACEMENT  1968   Left Knee, cartilage removed  . KNEE ARTHROSCOPY  1968   cartilage removal  . LUMBAR EPIDURAL INJECTION  02/2017   L3-4 interlaminar injection Mina Marble)  . MRI lumbar  2000   mild bulge L3/4, mild foraminal narrowing L4/5, L5/S2, small disk herniation  . MRI lumbar  11/2014   severe L3/4, L4/5 central canal stenosis (Dumonski)  . TOTAL HIP ARTHROPLASTY Right 03/27/2013   TOTAL HIP ARTHROPLASTY ANTERIOR APPROACH;  Surgeon: Hessie Dibble, MD  . TOTAL KNEE ARTHROPLASTY Left 09/26/2012   TOTAL KNEE ARTHROPLASTY;  Surgeon:  Hessie Dibble, MD   Family History  Problem Relation Age of Onset  . Stroke Father 57  . Diabetes Father   . Asthma Mother   . Cancer Mother        Vaginal; radiation dz of bowel  . Obesity Sister    Social History   Socioeconomic History  . Marital status: Married    Spouse name: Not on file  . Number of children: 4  . Years of education: college  . Highest education level: Not on file  Occupational History  . Occupation: Database administrator in [employer]  . Smoking status: Never Smoker  . Smokeless tobacco: Never Used  Substance and Sexual Activity  . Alcohol use: No    Comment: Rare  . Drug use: No  . Sexual activity: Never  Other Topics Concern  . Not on file  Social History Narrative   Lives with wife, has grown son   Activity: no regular exercise   Diet: good water, some fish, good vegetables, red meat 3x/wk   Right-handed.   Two 32-ounce glasses of tea.   Social Determinants of Health   Financial Resource Strain: Low Risk   . Difficulty of Paying Living Expenses: Not hard at all  Food Insecurity: No Food Insecurity  . Worried About Charity fundraiser in the Last Year: Never true  . Ran Out of Food in the Last Year: Never true  Transportation Needs: No Transportation Needs  . Lack of Transportation (Medical): No  . Lack of Transportation (Non-Medical): No  Physical Activity: Inactive  . Days of Exercise per Week: 0 days  . Minutes of Exercise per Session: 0 min  Stress: No Stress Concern Present  . Feeling of Stress : Not at all  Social Connections:   . Frequency of Communication with Friends and Family: Not on file  . Frequency of Social Gatherings with Friends and Family: Not on file  . Attends Religious Services: Not on file  . Active Member of Clubs or Organizations: Not on file  . Attends Archivist Meetings: Not on file  . Marital Status: Not on file    Outpatient Encounter Medications as of  09/05/2019  Medication Sig  . acetaminophen (TYLENOL) 500 MG tablet Take 500 mg by mouth 2 (two) times daily as needed.  Marland Kitchen allopurinol (ZYLOPRIM) 300 MG tablet TAKE HALF TABLET BY MOUTH BY MOUTH DAILY.  Marland Kitchen lisinopril (ZESTRIL) 5 MG tablet TAKE 1 TABLET BY MOUTH EVERY DAY  . Omega-3 Fatty Acids (FISH OIL) 1200 MG CAPS Take 1 capsule (1,200 mg total) by mouth 2 (two) times daily.  . colchicine 0.6 MG tablet TAKE 1 TABLET (0.6 MG TOTAL) BY MOUTH 2 (TWO) TIMES DAILY AS NEEDED. (Patient not taking: Reported on 09/05/2019)   No facility-administered encounter medications on file as of  09/05/2019.    Activities of Daily Living In your present state of health, do you have any difficulty performing the following activities: 09/05/2019  Hearing? N  Vision? N  Difficulty concentrating or making decisions? N  Walking or climbing stairs? N  Dressing or bathing? N  Doing errands, shopping? N  Preparing Food and eating ? N  Using the Toilet? N  In the past six months, have you accidently leaked urine? N  Do you have problems with loss of bowel control? N  Managing your Medications? N  Managing your Finances? N  Housekeeping or managing your Housekeeping? N  Some recent data might be hidden    Patient Care Team: [name], MD as PCP - General Arelia Sneddon, [place] as Consulting Physician (Optometry) Melrose Nakayama, MD as Consulting Physician (Orthopedic Surgery) Phylliss Bob, MD as Consulting Physician (Orthopedic Surgery) Normajean Glasgow, MD as Attending Physician (Physical Medicine and Rehabilitation) Eula Listen, DDS as Referring Physician (Dentistry)   Assessment:   This is a routine wellness examination for Gwyndolyn Saxon.  Exercise Activities and Dietary recommendations Current Exercise Habits: The patient does not participate in regular exercise at present, Exercise limited by: None identified  Goals    . Patient Stated     Starting 08/30/2018, I will continue to take medications as  prescribed.     . Patient Stated     09/05/2019, I will maintain and continue medications as prescribed.        Fall Risk Fall Risk  09/05/2019 08/30/2018 08/25/2017 08/04/2016 06/24/2016  Falls in the past year? 0 0 No No No  Comment - - - - -  Number falls in past yr: 0 - - - -  Injury with Fall? 0 - - - -  Risk for fall due to : Medication side effect - - - -  Follow up Falls evaluation completed;Falls prevention discussed - - - -   Is the patient's home free of loose throw rugs in walkways, pet beds, electrical cords, etc?   yes      Grab bars in the bathroom? yes      Handrails on the stairs?   yes      Adequate lighting?   yes  Timed Get Up and Go Performed: N/A  Depression Screen PHQ 2/9 Scores 09/05/2019 08/30/2018 08/25/2017 08/04/2016  PHQ - 2 Score 0 0 0 0  PHQ- 9 Score 0 0 0 -    Cognitive Function MMSE - Mini Mental State Exam 09/05/2019 08/30/2018 08/25/2017 08/04/2016  Orientation to time 5 5 5 5   Orientation to Place 5 5 5 5   Registration 3 3 3 3   Attention/ Calculation 5 0 0 0  Recall 3 3 2 3   Recall-comments - - unable to recall 1 of 3 words -  Language- name 2 objects - 0 0 0  Language- repeat 1 1 1 1   Language- follow 3 step command - 3 3 3   Language- read & follow direction - 0 0 0  Write a sentence - 0 0 0  Copy design - 0 0 0  Total score - 20 19 20   Mini Cog  Mini-Cog screen was completed. Maximum score is 22. A value of 0 denotes this part of the MMSE was not completed or the patient failed this part of the Mini-Cog screening.       Immunization History  Administered Date(s) Administered  . Influenza, High Dose Seasonal PF 06/16/2018, 08/03/2019  . Td 07/16/2001, 11/18/2008  . Tdap 03/03/2014  Qualifies for Shingles Vaccine? Yes  Screening Tests Health Maintenance  Topic Date Due  . DTAP VACCINES (1) 09/13/1951  . FOOT EXAM  07/15/1959  . HEMOGLOBIN A1C  06/07/2019  . PNA vac Low Risk Adult (1 of 2 - PCV13) 08/04/2026 (Originally  07/14/2014)  . OPHTHALMOLOGY EXAM  10/15/2019  . COLONOSCOPY  07/14/2020  . DTaP/Tdap/Td (4 - Td) 03/03/2024  . TETANUS/TDAP  03/03/2024  . INFLUENZA VACCINE  Completed  . Hepatitis C Screening  Completed   Cancer Screenings: Lung: Low Dose CT Chest recommended if Age 44-80 years, 30 pack-year currently smoking OR have quit w/in 15 years. Patient does not qualify. Colorectal: completed 07/14/2010  Additional Screenings:  Hepatitis C Screening: 06/16/2015      Plan:   Patient will maintain and continue medications as prescribed.  I have personally reviewed and noted the following in the patient's chart:   . Medical and social history . Use of alcohol, tobacco or illicit drugs  . Current medications and supplements . Functional ability and status . Nutritional status . Physical activity . Advanced directives . List of other physicians . Hospitalizations, surgeries, and ER visits in previous 12 months . Vitals . Screenings to include cognitive, depression, and falls . Referrals and appointments  In addition, I have reviewed and discussed with patient certain preventive protocols, quality metrics, and best practice recommendations. A written personalized care plan for preventive services as well as general preventive health recommendations were provided to patient.     Andrez Grime, LPN  [id number]

## 2019-09-05 NOTE — Patient Instructions (Signed)
[name] , Thank you for taking time to come for your Medicare Wellness Visit. I appreciate your ongoing commitment to your health goals. Please review the following plan we discussed and let me know if I can assist you in the future.   Screening recommendations/referrals: Colonoscopy: Up to date, completed 07/14/2010 Recommended yearly ophthalmology/optometry visit for glaucoma screening and checkup Recommended yearly dental visit for hygiene and checkup  Vaccinations: Influenza vaccine: Up to date, completed 08/03/2019 Pneumococcal vaccine: declined Tdap vaccine: Up to date, completed 03/03/2014 Shingles vaccine: discussed    Advanced directives: copy in chart  Conditions/risks identified: diabetes, hypertension, hyperlipidemia  Next appointment: 09/11/2019 @ 8:30 am   Preventive Care 29 Years and Older, Male Preventive care refers to lifestyle choices and visits with your health care provider that can promote health and wellness. What does preventive care include?  A yearly physical exam. This is also called an annual well check.  Dental exams once or twice a year.  Routine eye exams. Ask your health care provider how often you should have your eyes checked.  Personal lifestyle choices, including:  Daily care of your teeth and gums.  Regular physical activity.  Eating a healthy diet.  Avoiding tobacco and drug use.  Limiting alcohol use.  Practicing safe sex.  Taking low doses of aspirin every day.  Taking vitamin and mineral supplements as recommended by your health care provider. What happens during an annual well check? The services and screenings done by your health care provider during your annual well check will depend on your age, overall health, lifestyle risk factors, and family history of disease. Counseling  Your health care provider may ask you questions about your:  Alcohol use.  Tobacco use.  Drug use.  Emotional well-being.  Home and  relationship well-being.  Sexual activity.  Eating habits.  History of falls.  Memory and ability to understand (cognition).  Work and work Statistician. Screening  You may have the following tests or measurements:  Height, weight, and BMI.  Blood pressure.  Lipid and cholesterol levels. These may be checked every 5 years, or more frequently if you are over 17 years old.  Skin check.  Lung cancer screening. You may have this screening every year starting at age 47 if you have a 30-pack-year history of smoking and currently smoke or have quit within the past 15 years.  Fecal occult blood test (FOBT) of the stool. You may have this test every year starting at age 33.  Flexible sigmoidoscopy or colonoscopy. You may have a sigmoidoscopy every 5 years or a colonoscopy every 10 years starting at age 13.  Prostate cancer screening. Recommendations will vary depending on your family history and other risks.  Hepatitis C blood test.  Hepatitis B blood test.  Sexually transmitted disease (STD) testing.  Diabetes screening. This is done by checking your blood sugar (glucose) after you have not eaten for a while (fasting). You may have this done every 1-3 years.  Abdominal aortic aneurysm (AAA) screening. You may need this if you are a current or former smoker.  Osteoporosis. You may be screened starting at age 10 if you are at high risk. Talk with your health care provider about your test results, treatment options, and if necessary, the need for more tests. Vaccines  Your health care provider may recommend certain vaccines, such as:  Influenza vaccine. This is recommended every year.  Tetanus, diphtheria, and acellular pertussis (Tdap, Td) vaccine. You may need a Td booster every  10 years.  Zoster vaccine. You may need this after age 50.  Pneumococcal 13-valent conjugate (PCV13) vaccine. One dose is recommended after age 75.  Pneumococcal polysaccharide (PPSV23) vaccine. One  dose is recommended after age 72. Talk to your health care provider about which screenings and vaccines you need and how often you need them. This information is not intended to replace advice given to you by your health care provider. Make sure you discuss any questions you have with your health care provider. Document Released: 08/29/2015 Document Revised: 04/21/2016 Document Reviewed: 06/03/2015 Elsevier Interactive Patient Education  2017 [place] Prevention in the Home Falls can cause injuries. They can happen to people of all ages. There are many things you can do to make your home safe and to help prevent falls. What can I do on the outside of my home?  Regularly fix the edges of walkways and driveways and fix any cracks.  Remove anything that might make you trip as you walk through a door, such as a raised step or threshold.  Trim any bushes or trees on the path to your home.  Use bright outdoor lighting.  Clear any walking paths of anything that might make someone trip, such as rocks or tools.  Regularly check to see if handrails are loose or broken. Make sure that both sides of any steps have handrails.  Any raised decks and porches should have guardrails on the edges.  Have any leaves, snow, or ice cleared regularly.  Use sand or salt on walking paths during winter.  Clean up any spills in your garage right away. This includes oil or grease spills. What can I do in the bathroom?  Use night lights.  Install grab bars by the toilet and in the tub and shower. Do not use towel bars as grab bars.  Use non-skid mats or decals in the tub or shower.  If you need to sit down in the shower, use a plastic, non-slip stool.  Keep the floor dry. Clean up any water that spills on the floor as soon as it happens.  Remove soap buildup in the tub or shower regularly.  Attach bath mats securely with double-sided non-slip rug tape.  Do not have throw rugs and other  things on the floor that can make you trip. What can I do in the bedroom?  Use night lights.  Make sure that you have a light by your bed that is easy to reach.  Do not use any sheets or blankets that are too big for your bed. They should not hang down onto the floor.  Have a firm chair that has side arms. You can use this for support while you get dressed.  Do not have throw rugs and other things on the floor that can make you trip. What can I do in the kitchen?  Clean up any spills right away.  Avoid walking on wet floors.  Keep items that you use a lot in easy-to-reach places.  If you need to reach something above you, use a strong step stool that has a grab bar.  Keep electrical cords out of the way.  Do not use floor polish or wax that makes floors slippery. If you must use wax, use non-skid floor wax.  Do not have throw rugs and other things on the floor that can make you trip. What can I do with my stairs?  Do not leave any items on the stairs.  Make sure  that there are handrails on both sides of the stairs and use them. Fix handrails that are broken or loose. Make sure that handrails are as long as the stairways.  Check any carpeting to make sure that it is firmly attached to the stairs. Fix any carpet that is loose or worn.  Avoid having throw rugs at the top or bottom of the stairs. If you do have throw rugs, attach them to the floor with carpet tape.  Make sure that you have a light switch at the top of the stairs and the bottom of the stairs. If you do not have them, ask someone to add them for you. What else can I do to help prevent falls?  Wear shoes that:  Do not have high heels.  Have rubber bottoms.  Are comfortable and fit you well.  Are closed at the toe. Do not wear sandals.  If you use a stepladder:  Make sure that it is fully opened. Do not climb a closed stepladder.  Make sure that both sides of the stepladder are locked into place.  Ask  someone to hold it for you, if possible.  Clearly mark and make sure that you can see:  Any grab bars or handrails.  First and last steps.  Where the edge of each step is.  Use tools that help you move around (mobility aids) if they are needed. These include:  Canes.  Walkers.  Scooters.  Crutches.  Turn on the lights when you go into a dark area. Replace any light bulbs as soon as they burn out.  Set up your furniture so you have a clear path. Avoid moving your furniture around.  If any of your floors are uneven, fix them.  If there are any pets around you, be aware of where they are.  Review your medicines with your doctor. Some medicines can make you feel dizzy. This can increase your chance of falling. Ask your doctor what other things that you can do to help prevent falls. This information is not intended to replace advice given to you by your health care provider. Make sure you discuss any questions you have with your health care provider. Document Released: 05/29/2009 Document Revised: 01/08/2016 Document Reviewed: 09/06/2014 Elsevier Interactive Patient Education  2017 [employer].

## 2019-09-05 NOTE — Progress Notes (Signed)
PCP notes:  Health Maintenance: Declined pneumonia vaccines   Abnormal Screenings: none   Patient concerns: Patient wants to discuss his gout flares after surgical procedures   Nurse concerns: none   Next PCP appt: 09/11/2019 @ 8:30 am

## 2019-09-06 DIAGNOSIS — Z4789 Encounter for other orthopedic aftercare: Secondary | ICD-10-CM | POA: Diagnosis not present

## 2019-09-06 DIAGNOSIS — M75121 Complete rotator cuff tear or rupture of right shoulder, not specified as traumatic: Secondary | ICD-10-CM | POA: Diagnosis not present

## 2019-09-11 ENCOUNTER — Encounter: Payer: Self-pay | Admitting: Family Medicine

## 2019-09-11 ENCOUNTER — Ambulatory Visit (INDEPENDENT_AMBULATORY_CARE_PROVIDER_SITE_OTHER): Payer: Medicare Other | Admitting: Family Medicine

## 2019-09-11 ENCOUNTER — Other Ambulatory Visit: Payer: Self-pay

## 2019-09-11 VITALS — BP 126/64 | HR 74 | Temp 98.1°F | Ht 71.25 in | Wt 236.3 lb

## 2019-09-11 DIAGNOSIS — E785 Hyperlipidemia, unspecified: Secondary | ICD-10-CM | POA: Diagnosis not present

## 2019-09-11 DIAGNOSIS — Z7189 Other specified counseling: Secondary | ICD-10-CM | POA: Diagnosis not present

## 2019-09-11 DIAGNOSIS — E1169 Type 2 diabetes mellitus with other specified complication: Secondary | ICD-10-CM

## 2019-09-11 DIAGNOSIS — M8949 Other hypertrophic osteoarthropathy, multiple sites: Secondary | ICD-10-CM

## 2019-09-11 DIAGNOSIS — I679 Cerebrovascular disease, unspecified: Secondary | ICD-10-CM | POA: Diagnosis not present

## 2019-09-11 DIAGNOSIS — I10 Essential (primary) hypertension: Secondary | ICD-10-CM

## 2019-09-11 DIAGNOSIS — R42 Dizziness and giddiness: Secondary | ICD-10-CM

## 2019-09-11 DIAGNOSIS — E118 Type 2 diabetes mellitus with unspecified complications: Secondary | ICD-10-CM

## 2019-09-11 DIAGNOSIS — M1A00X0 Idiopathic chronic gout, unspecified site, without tophus (tophi): Secondary | ICD-10-CM | POA: Diagnosis not present

## 2019-09-11 DIAGNOSIS — E669 Obesity, unspecified: Secondary | ICD-10-CM

## 2019-09-11 DIAGNOSIS — M159 Polyosteoarthritis, unspecified: Secondary | ICD-10-CM

## 2019-09-11 MED ORDER — LISINOPRIL 5 MG PO TABS: 5.0000 mg | ORAL_TABLET | Freq: Every day | ORAL | 3 refills | Status: DC

## 2019-09-11 MED ORDER — FISH OIL 1200 MG PO CAPS: 2.0000 | ORAL_CAPSULE | Freq: Every day | ORAL | Status: DC

## 2019-09-11 MED ORDER — ALLOPURINOL 300 MG PO TABS: 300.0000 mg | ORAL_TABLET | Freq: Every day | ORAL | 3 refills | Status: DC

## 2019-09-11 NOTE — Assessment & Plan Note (Signed)
Has recently started aspirin 81mg  daily. Discussed statin benefits.

## 2019-09-11 NOTE — Patient Instructions (Addendum)
Consider pneumonia shot. If interested, check with pharmacy about new 2 shot shingles series (shingrix).  Increase allopurinol to 300mg  1 tablet daily.  Watch sugar in the diet You are doing well today. Return in 6 months for follow up visit.   Health Maintenance After Age 71 After age 47, you are at a higher risk for certain long-term diseases and infections as well as injuries from falls. Falls are a major cause of broken bones and head injuries in people who are older than age 76. Getting regular preventive care can help to keep you healthy and well. Preventive care includes getting regular testing and making lifestyle changes as recommended by your health care provider. Talk with your health care provider about:  Which screenings and tests you should have. A screening is a test that checks for a disease when you have no symptoms.  A diet and exercise plan that is right for you. What should I know about screenings and tests to prevent falls? Screening and testing are the best ways to find a health problem early. Early diagnosis and treatment give you the best chance of managing medical conditions that are common after age 55. Certain conditions and lifestyle choices may make you more likely to have a fall. Your health care provider may recommend:  Regular vision checks. Poor vision and conditions such as cataracts can make you more likely to have a fall. If you wear glasses, make sure to get your prescription updated if your vision changes.  Medicine review. Work with your health care provider to regularly review all of the medicines you are taking, including over-the-counter medicines. Ask your health care provider about any side effects that may make you more likely to have a fall. Tell your health care provider if any medicines that you take make you feel dizzy or sleepy.  Osteoporosis screening. Osteoporosis is a condition that causes the bones to get weaker. This can make the bones weak  and cause them to break more easily.  Blood pressure screening. Blood pressure changes and medicines to control blood pressure can make you feel dizzy.  Strength and balance checks. Your health care provider may recommend certain tests to check your strength and balance while standing, walking, or changing positions.  Foot health exam. Foot pain and numbness, as well as not wearing proper footwear, can make you more likely to have a fall.  Depression screening. You may be more likely to have a fall if you have a fear of falling, feel emotionally low, or feel unable to do activities that you used to do.  Alcohol use screening. Using too much alcohol can affect your balance and may make you more likely to have a fall. What actions can I take to lower my risk of falls? General instructions  Talk with your health care provider about your risks for falling. Tell your health care provider if: ? You fall. Be sure to tell your health care provider about all falls, even ones that seem minor. ? You feel dizzy, sleepy, or off-balance.  Take over-the-counter and prescription medicines only as told by your health care provider. These include any supplements.  Eat a healthy diet and maintain a healthy weight. A healthy diet includes low-fat dairy products, low-fat (lean) meats, and fiber from whole grains, beans, and lots of fruits and vegetables. Home safety  Remove any tripping hazards, such as rugs, cords, and clutter.  Install safety equipment such as grab bars in bathrooms and safety rails on stairs.  Keep rooms and walkways well-lit. Activity   Follow a regular exercise program to stay fit. This will help you maintain your balance. Ask your health care provider what types of exercise are appropriate for you.  If you need a cane or walker, use it as recommended by your health care provider.  Wear supportive shoes that have nonskid soles. Lifestyle  Do not drink alcohol if your health  care provider tells you not to drink.  If you drink alcohol, limit how much you have: ? 0-1 drink a day for women. ? 0-2 drinks a day for men.  Be aware of how much alcohol is in your drink. In the [place] one drink equals one typical bottle of beer (12 oz), one-half glass of wine (5 oz), or one shot of hard liquor (1 oz).  Do not use any products that contain nicotine or tobacco, such as cigarettes and e-cigarettes. If you need help quitting, ask your health care provider. Summary  Having a healthy lifestyle and getting preventive care can help to protect your health and wellness after age 93.  Screening and testing are the best way to find a health problem early and help you avoid having a fall. Early diagnosis and treatment give you the best chance for managing medical conditions that are more common for people who are older than age 5.  Falls are a major cause of broken bones and head injuries in people who are older than age 90. Take precautions to prevent a fall at home.  Work with your health care provider to learn what changes you can make to improve your health and wellness and to prevent falls. This information is not intended to replace advice given to you by your health care provider. Make sure you discuss any questions you have with your health care provider. Document Revised: 11/23/2018 Document Reviewed: 06/15/2017 Elsevier Patient Education  2020 [employer].

## 2019-09-11 NOTE — Assessment & Plan Note (Signed)
Chronic, deteriorated. Encouraged decreased sweet tea intake. RTC 6 mo f/u visit.

## 2019-09-11 NOTE — Assessment & Plan Note (Addendum)
Chronic, not on statin. Discussed benefits of statin use in diabetes - declines for now. Will reassess in 6 months The 10-year ASCVD risk score Mikey Bussing DC Brooke Bonito., et al., 2013) is: 38.5%   Values used to calculate the score:     Age: 69 years     Sex: Male     Is Non-Hispanic African American: No     Diabetic: Yes     Tobacco smoker: No     Systolic Blood Pressure: 123XX123 mmHg     Is BP treated: Yes     HDL Cholesterol: 31.8 mg/dL     Total Cholesterol: 162 mg/dL

## 2019-09-11 NOTE — Assessment & Plan Note (Signed)
Advanced directive at home, copy scanned in chart 11/2018. Ronald Bullock is HCPOA. Does not want prolonged life support if irreversible terminal condition. Healthcare agent directive should be followed over advanced directive.

## 2019-09-11 NOTE — Assessment & Plan Note (Signed)
Urate too high - and with recent gout flares - will increase allopurinol to 300mg  daily. Monitor for gout attack with any dosing change.

## 2019-09-11 NOTE — Assessment & Plan Note (Signed)
Chronic, stable. Continue current regimen. 

## 2019-09-11 NOTE — Assessment & Plan Note (Signed)
Encouraged healthy diet and lifestyle changes to affect sustainable weight loss.  

## 2019-09-11 NOTE — Assessment & Plan Note (Signed)
Fortunately largely resolved. Appreciate neuro eval. MRI showed chronic microvascular ischemic changes.

## 2019-09-11 NOTE — Progress Notes (Signed)
This visit was conducted in person.  BP 126/64 (BP Location: Left Arm, Patient Position: Sitting, Cuff Size: Large)   Pulse 74   Temp 98.1 F (36.7 C) (Temporal)   Ht 5' 11.25" (1.81 m)   Wt 236 lb 5 oz (107.2 kg)   SpO2 98%   BMI 32.73 kg/m    CC: AMW f/u visit Subjective:    Patient ID: Ronald Husky., male    DOB: 1950/09/25, 69 y.o.   MRN: [id number]  HPI: Ronald Medal. is a 69 y.o. male presenting on 09/11/2019 for Annual Exam (Prt 2. )   Saw health advisor last year for medicare wellness visit. Note reviewed.   No exam data present    Clinical Support from 09/05/2019 in [place] at [facility] Total Score  0      Fall Risk  09/05/2019 08/30/2018 08/25/2017 08/04/2016 06/24/2016  Falls in the past year? 0 0 No No No  Comment - - - - -  Number falls in past yr: 0 - - - -  Injury with Fall? 0 - - - -  Risk for fall due to : Medication side effect - - - -  Follow up Falls evaluation completed;Falls prevention discussed - - - -    Recent outpatient R shoulder surgery Tamera Punt) 05/2019 - completed PT, currently undergoing HEP.   Vertigo - saw neurology - marked improvement on its own. MR brain showed mild small vessel disease. Has started aspirin 81mg  daily.   Has had 2 acute gout flares this past year around time of surgery - treated with colchicine with benefit.   Preventative: Colonoscopy 06/2010 wnl, rpt due 10 yr. H/o internal hemorrhoids. Prostate - stable PSA <1, good stream, no nocturia. No fmhx. Flu- yearly  Pneumococcal - declines but will consider Tdap 2015 Shingles - on wait list at pharmacy Advanced directive at home, copy scanned in chart 11/2018. Ronald Bullock is HCPOA. Does not want prolonged life support if irreversible terminal condition. Healthcare agent directive should be followed over advanced directive.  Seat belt use discussed Sunscreen use discussed, no changing moles on skin.  Non smoker  Alcohol  - rare  Dentist yearly Eye exam tries yearly  Bowel - no constipation Bladder - no incontinence  Lives with wife  Grown children Occ: truck driver  Activity: no regular exercise Diet: good water, some fish, good vegetables, red meat 3x/wk      Relevant past medical, surgical, family and social history reviewed and updated as indicated. Interim medical history since our last visit reviewed. Allergies and medications reviewed and updated. Outpatient Medications Prior to Visit  Medication Sig Dispense Refill  . acetaminophen (TYLENOL) 500 MG tablet Take 500 mg by mouth 2 (two) times daily as needed.    . ASPIRIN 81 PO Take by mouth daily.    . colchicine 0.6 MG tablet TAKE 1 TABLET (0.6 MG TOTAL) BY MOUTH 2 (TWO) TIMES DAILY AS NEEDED. 30 tablet 3  . Multiple Vitamins-Minerals (CENTRUM SILVER 50+MEN PO) Take by mouth daily.    Marland Kitchen allopurinol (ZYLOPRIM) 300 MG tablet TAKE HALF TABLET BY MOUTH BY MOUTH DAILY. 45 tablet 2  . lisinopril (ZESTRIL) 5 MG tablet TAKE 1 TABLET BY MOUTH EVERY DAY 90 tablet 0  . Omega-3 Fatty Acids (FISH OIL) 1200 MG CAPS Take 1 capsule (1,200 mg total) by mouth 2 (two) times daily.     No facility-administered medications prior to visit.     Per  HPI unless specifically indicated in ROS section below Review of Systems Objective:    BP 126/64 (BP Location: Left Arm, Patient Position: Sitting, Cuff Size: Large)   Pulse 74   Temp 98.1 F (36.7 C) (Temporal)   Ht 5' 11.25" (1.81 m)   Wt 236 lb 5 oz (107.2 kg)   SpO2 98%   BMI 32.73 kg/m   Wt Readings from Last 3 Encounters:  09/11/19 236 lb 5 oz (107.2 kg)  09/05/19 240 lb (108.9 kg)  07/09/19 237 lb (107.5 kg)    Physical Exam Vitals and nursing note reviewed.  Constitutional:      General: He is not in acute distress.    Appearance: Normal appearance. He is well-developed. He is obese. He is not ill-appearing.  HENT:     Head: Normocephalic and atraumatic.     Right Ear: Hearing, tympanic  membrane, ear canal and external ear normal.     Left Ear: Hearing, tympanic membrane, ear canal and external ear normal.     Mouth/Throat:     Pharynx: Uvula midline.  Eyes:     General: No scleral icterus.    Extraocular Movements: Extraocular movements intact.     Conjunctiva/sclera: Conjunctivae normal.     Pupils: Pupils are equal, round, and reactive to light.     Comments: S/p L cataract surgery  Neck:     Thyroid: No thyromegaly or thyroid tenderness.     Vascular: No carotid bruit.  Cardiovascular:     Rate and Rhythm: Normal rate and regular rhythm.     Pulses: Normal pulses.          Radial pulses are 2+ on the right side and 2+ on the left side.     Heart sounds: Normal heart sounds. No murmur.  Pulmonary:     Effort: Pulmonary effort is normal. No respiratory distress.     Breath sounds: Normal breath sounds. No wheezing, rhonchi or rales.  Abdominal:     General: Abdomen is flat. Bowel sounds are normal. There is no distension.     Palpations: Abdomen is soft. There is no mass.     Tenderness: There is no abdominal tenderness. There is no guarding or rebound.     Hernia: No hernia is present.  Musculoskeletal:        General: Normal range of motion.     Cervical back: Normal range of motion and neck supple.     Right lower leg: No edema.     Left lower leg: No edema.  Lymphadenopathy:     Cervical: No cervical adenopathy.  Skin:    General: Skin is warm and dry.     Findings: No rash.  Neurological:     General: No focal deficit present.     Mental Status: He is alert and oriented to person, place, and time.     Comments: CN grossly intact, station and gait intact  Psychiatric:        Mood and Affect: Mood normal.        Behavior: Behavior normal.        Thought Content: Thought content normal.        Judgment: Judgment normal.       Results for orders placed or performed in visit on 09/05/19  PSA, Medicare  Result Value Ref Range   PSA 0.84 0.10 -  4.00 ng/ml  Uric acid  Result Value Ref Range   Uric Acid, Serum 7.7 4.0 - 7.8 mg/dL  Hemoglobin A1c  Result Value Ref Range   Hgb A1c MFr Bld 6.6 (H) 4.6 - 6.5 %  Comprehensive metabolic panel  Result Value Ref Range   Sodium 137 135 - 145 mEq/L   Potassium 4.7 3.5 - 5.1 mEq/L   Chloride 102 96 - 112 mEq/L   CO2 28 19 - 32 mEq/L   Glucose, Bld 133 (H) 70 - 99 mg/dL   BUN 20 6 - 23 mg/dL   Creatinine, Ser 1.02 0.40 - 1.50 mg/dL   Total Bilirubin 0.4 0.2 - 1.2 mg/dL   Alkaline Phosphatase 63 39 - 117 U/L   AST 22 0 - 37 U/L   ALT 29 0 - 53 U/L   Total Protein 6.6 6.0 - 8.3 g/dL   Albumin 4.0 3.5 - 5.2 g/dL   GFR 72.17 >60.00 mL/min   Calcium 8.8 8.4 - 10.5 mg/dL  Lipid panel  Result Value Ref Range   Cholesterol 162 0 - 200 mg/dL   Triglycerides 185.0 (H) 0.0 - 149.0 mg/dL   HDL 31.80 (L) >39.00 mg/dL   VLDL 37.0 0.0 - 40.0 mg/dL   LDL Cholesterol 93 0 - 99 mg/dL   Total CHOL/HDL Ratio 5    NonHDL 130.35    Assessment & Plan:  This visit occurred during the SARS-CoV-2 public health emergency.  Safety protocols were in place, including screening questions prior to the visit, additional usage of staff PPE, and extensive cleaning of exam room while observing appropriate contact time as indicated for disinfecting solutions.   Problem List Items Addressed This Visit    Vertigo    Fortunately largely resolved. Appreciate neuro eval. MRI showed chronic microvascular ischemic changes.       Small vessel disease, cerebrovascular    Has recently started aspirin 81mg  daily. Discussed statin benefits.       Relevant Medications   ASPIRIN 81 PO   lisinopril (ZESTRIL) 5 MG tablet   Osteoarthritis, multiple sites   Relevant Medications   ASPIRIN 81 PO   allopurinol (ZYLOPRIM) 300 MG tablet   Obesity, Class I, BMI 30-34.9    Encouraged healthy diet and lifestyle changes to affect sustainable weight loss.       Hyperlipidemia associated with type 2 diabetes mellitus (HCC)     Chronic, not on statin. Discussed benefits of statin use in diabetes - declines for now. Will reassess in 6 months The 10-year ASCVD risk score Mikey Bussing DC Brooke Bonito., et al., 2013) is: 38.5%   Values used to calculate the score:     Age: 50 years     Sex: Male     Is Non-Hispanic African American: No     Diabetic: Yes     Tobacco smoker: No     Systolic Blood Pressure: 123XX123 mmHg     Is BP treated: Yes     HDL Cholesterol: 31.8 mg/dL     Total Cholesterol: 162 mg/dL       Relevant Medications   ASPIRIN 81 PO   lisinopril (ZESTRIL) 5 MG tablet   Gout    Urate too high - and with recent gout flares - will increase allopurinol to 300mg  daily. Monitor for gout attack with any dosing change.       Essential hypertension    Chronic, stable. Continue current regimen.       Relevant Medications   ASPIRIN 81 PO   lisinopril (ZESTRIL) 5 MG tablet   Controlled diabetes mellitus type 2 with complications (HCC)    Chronic,  deteriorated. Encouraged decreased sweet tea intake. RTC 6 mo f/u visit.       Relevant Medications   ASPIRIN 81 PO   lisinopril (ZESTRIL) 5 MG tablet   Advanced care planning/counseling discussion - Primary    Advanced directive at home, copy scanned in chart 11/2018. Nang Havel is HCPOA. Does not want prolonged life support if irreversible terminal condition. Healthcare agent directive should be followed over advanced directive.           Meds ordered this encounter  Medications  . allopurinol (ZYLOPRIM) 300 MG tablet    Sig: Take 1 tablet (300 mg total) by mouth daily.    Dispense:  90 tablet    Refill:  3    Note new sig  . lisinopril (ZESTRIL) 5 MG tablet    Sig: Take 1 tablet (5 mg total) by mouth daily.    Dispense:  90 tablet    Refill:  3  . Omega-3 Fatty Acids (FISH OIL) 1200 MG CAPS    Sig: Take 2 capsules (2,400 mg total) by mouth daily.   No orders of the defined types were placed in this encounter.   Patient instructions: Consider pneumonia  shot. If interested, check with pharmacy about new 2 shot shingles series (shingrix).  Increase allopurinol to 300mg  1 tablet daily.  Watch sugar in the diet You are doing well today. Return in 6 months for follow up visit.   Follow up plan: Return in about 6 months (around 03/10/2020) for follow up visit.  [name], MD

## 2019-10-12 DIAGNOSIS — Z9889 Other specified postprocedural states: Secondary | ICD-10-CM | POA: Diagnosis not present

## 2019-10-12 DIAGNOSIS — M75121 Complete rotator cuff tear or rupture of right shoulder, not specified as traumatic: Secondary | ICD-10-CM | POA: Diagnosis not present

## 2019-11-29 DIAGNOSIS — Z03818 Encounter for observation for suspected exposure to other biological agents ruled out: Secondary | ICD-10-CM | POA: Diagnosis not present

## 2019-11-29 DIAGNOSIS — Z20828 Contact with and (suspected) exposure to other viral communicable diseases: Secondary | ICD-10-CM | POA: Diagnosis not present

## 2019-12-19 DIAGNOSIS — M48062 Spinal stenosis, lumbar region with neurogenic claudication: Secondary | ICD-10-CM | POA: Diagnosis not present

## 2020-01-29 DIAGNOSIS — H16141 Punctate keratitis, right eye: Secondary | ICD-10-CM | POA: Diagnosis not present

## 2020-03-10 ENCOUNTER — Ambulatory Visit (INDEPENDENT_AMBULATORY_CARE_PROVIDER_SITE_OTHER): Payer: Medicare Other | Admitting: Family Medicine

## 2020-03-10 ENCOUNTER — Other Ambulatory Visit: Payer: Self-pay

## 2020-03-10 ENCOUNTER — Encounter: Payer: Self-pay | Admitting: Family Medicine

## 2020-03-10 VITALS — BP 128/64 | HR 65 | Temp 97.4°F | Ht 71.25 in | Wt 240.9 lb

## 2020-03-10 DIAGNOSIS — Z96652 Presence of left artificial knee joint: Secondary | ICD-10-CM | POA: Diagnosis not present

## 2020-03-10 DIAGNOSIS — M1A00X0 Idiopathic chronic gout, unspecified site, without tophus (tophi): Secondary | ICD-10-CM | POA: Diagnosis not present

## 2020-03-10 DIAGNOSIS — M8949 Other hypertrophic osteoarthropathy, multiple sites: Secondary | ICD-10-CM | POA: Diagnosis not present

## 2020-03-10 DIAGNOSIS — M25662 Stiffness of left knee, not elsewhere classified: Secondary | ICD-10-CM | POA: Diagnosis not present

## 2020-03-10 DIAGNOSIS — M159 Polyosteoarthritis, unspecified: Secondary | ICD-10-CM

## 2020-03-10 DIAGNOSIS — E118 Type 2 diabetes mellitus with unspecified complications: Secondary | ICD-10-CM | POA: Diagnosis not present

## 2020-03-10 DIAGNOSIS — R609 Edema, unspecified: Secondary | ICD-10-CM | POA: Diagnosis not present

## 2020-03-10 LAB — POCT GLYCOSYLATED HEMOGLOBIN (HGB A1C): Hemoglobin A1C: 6.4 % — AB (ref 4.0–5.6)

## 2020-03-10 NOTE — Patient Instructions (Addendum)
We will request records from [facility].  Back off sweet tea A1c was better today - back in prediabetes range.  Send [place] dates of covid shot.  Return in 6 months for wellness visit.

## 2020-03-10 NOTE — Assessment & Plan Note (Signed)
Discussed NSAID vs tylenol use - limit 3gm tylenol/day - he only take 1.5gm.

## 2020-03-10 NOTE — Assessment & Plan Note (Addendum)
Chronic, improvement noted. Congratulated. Will request latest eye exam. reassess at 6 mo f/u visit.

## 2020-03-10 NOTE — Assessment & Plan Note (Signed)
Stable period without flare on higher allopurinol dose - continue.

## 2020-03-10 NOTE — Assessment & Plan Note (Signed)
meloxicam related - improving since he's stopped NSAID.

## 2020-03-10 NOTE — Progress Notes (Signed)
This visit was conducted in person.  BP (!) 128/64 (BP Location: Right Arm, Patient Position: Sitting, Cuff Size: Normal)   Pulse 65   Temp (!) 97.4 F (36.3 C) (Temporal)   Ht 5' 11.25" (1.81 m)   Wt (!) 240 lb 14.4 oz (109.3 kg)   SpO2 98%   BMI 33.36 kg/m    CC: 49mo f/u visit  Subjective:    Patient ID: Ronald Bullock., male    DOB: 1951/07/17, 69 y.o.   MRN: [id number]  HPI: Ronald Bullock. is a 69 y.o. male presenting on 03/10/2020 for Follow-up   Lost sister 2 wks ago.   Has been more active this year.  He stopped meloxicam, has been using tylenol for pain. When he tool meloxicam for 1 wk course - noted fluid retention to ankles.   Gout - no recent flares since increasing allopurinol to 300mg  daily.   DM - does not regularly check sugars. Compliant with antihyperglycemic regimen which includes: diet controlled. Has not really limited sweet tea. Denies hypoglycemic symptoms. Denies paresthesias. Feet chronically sensitive - avoids walking barefoot. Last diabetic eye exam 12/2019. Pneumovax: not due yet. Prevnar: 01/2020. Glucometer brand: doesn't have at home. DSME: declines.  Lab Results  Component Value Date   HGBA1C 6.4 (A) 03/10/2020   Diabetic Foot Exam - Simple   Simple Foot Form Diabetic Foot exam was performed with the following findings: Yes 03/10/2020  9:59 AM  Visual Inspection No deformities, no ulcerations, no other skin breakdown bilaterally: Yes Sensation Testing Intact to touch and monofilament testing bilaterally: Yes Pulse Check Posterior Tibialis and Dorsalis pulse intact bilaterally: Yes Comments    No results found for: Derl Barrow       Relevant past medical, surgical, family and social history reviewed and updated as indicated. Interim medical history since our last visit reviewed. Allergies and medications reviewed and updated. Outpatient Medications Prior to Visit  Medication Sig Dispense Refill  .  acetaminophen (TYLENOL) 500 MG tablet Take 500 mg by mouth 2 (two) times daily as needed.    Marland Kitchen allopurinol (ZYLOPRIM) 300 MG tablet Take 1 tablet (300 mg total) by mouth daily. 90 tablet 3  . ASPIRIN 81 PO Take by mouth daily.    . colchicine 0.6 MG tablet TAKE 1 TABLET (0.6 MG TOTAL) BY MOUTH 2 (TWO) TIMES DAILY AS NEEDED. 30 tablet 3  . lisinopril (ZESTRIL) 5 MG tablet Take 1 tablet (5 mg total) by mouth daily. 90 tablet 3  . Multiple Vitamins-Minerals (CENTRUM SILVER 50+MEN PO) Take by mouth daily.    . Omega-3 Fatty Acids (FISH OIL) 1200 MG CAPS Take 2 capsules (2,400 mg total) by mouth daily.     No facility-administered medications prior to visit.     Per HPI unless specifically indicated in ROS section below Review of Systems Objective:  BP (!) 128/64 (BP Location: Right Arm, Patient Position: Sitting, Cuff Size: Normal)   Pulse 65   Temp (!) 97.4 F (36.3 C) (Temporal)   Ht 5' 11.25" (1.81 m)   Wt (!) 240 lb 14.4 oz (109.3 kg)   SpO2 98%   BMI 33.36 kg/m   Wt Readings from Last 3 Encounters:  03/10/20 (!) 240 lb 14.4 oz (109.3 kg)  09/11/19 236 lb 5 oz (107.2 kg)  09/05/19 240 lb (108.9 kg)      Physical Exam Vitals and nursing note reviewed.  Constitutional:      General: He is not in acute distress.  Appearance: Normal appearance. He is well-developed. He is not ill-appearing.  HENT:     Head: Normocephalic and atraumatic.  Eyes:     General: No scleral icterus.    Extraocular Movements: Extraocular movements intact.     Conjunctiva/sclera: Conjunctivae normal.     Pupils: Pupils are equal, round, and reactive to light.  Cardiovascular:     Rate and Rhythm: Normal rate and regular rhythm.     Pulses: Normal pulses.     Heart sounds: Normal heart sounds. No murmur heard.   Pulmonary:     Effort: Pulmonary effort is normal. No respiratory distress.     Breath sounds: Normal breath sounds. No wheezing, rhonchi or rales.  Musculoskeletal:        General:  Normal range of motion.     Cervical back: Normal range of motion and neck supple.     Right lower leg: Edema (tr) present.     Left lower leg: Edema (tr) present.     Comments: See HPI for foot exam if done  Lymphadenopathy:     Cervical: No cervical adenopathy.  Skin:    General: Skin is warm and dry.     Findings: No rash.  Neurological:     Mental Status: He is alert.       Results for orders placed or performed in visit on 03/10/20  HgB A1c  Result Value Ref Range   Hemoglobin A1C 6.4 (A) 4.0 - 5.6 %   HbA1c POC (<> result, manual entry)     HbA1c, POC (prediabetic range)     HbA1c, POC (controlled diabetic range)     Assessment & Plan:  This visit occurred during the SARS-CoV-2 public health emergency.  Safety protocols were in place, including screening questions prior to the visit, additional usage of staff PPE, and extensive cleaning of exam room while observing appropriate contact time as indicated for disinfecting solutions.   Problem List Items Addressed This Visit    PERIPHERAL EDEMA    meloxicam related - improving since he's stopped NSAID.       Osteoarthritis, multiple sites    Discussed NSAID vs tylenol use - limit 3gm tylenol/day - he only take 1.5gm.       Gout    Stable period without flare on higher allopurinol dose - continue.       Controlled diabetes mellitus type 2 with complications (HCC) - Primary    Chronic, improvement noted. Congratulated. Will request latest eye exam. reassess at 6 mo f/u visit.       Relevant Orders   HgB A1c (Completed)       No orders of the defined types were placed in this encounter.  Orders Placed This Encounter  Procedures  . HgB A1c    Patient Instructions  We will request records from [facility] Dr Lindon Romp.  Back off sweet tea A1c was better today - back in prediabetes range.  Send [place] dates of covid shot.  Return in 6 months for wellness visit.   Follow up plan: Return in about 6  months (around 09/10/2020) for medicare wellness visit.  [name], MD

## 2020-03-21 ENCOUNTER — Other Ambulatory Visit: Payer: Self-pay | Admitting: Family Medicine

## 2020-05-05 IMAGING — DX RIGHT SHOULDER - 2+ VIEW
4 series · 4 of 4 positions shown · non-contrast
Comparison: None.

CLINICAL DATA: Pt went to shut door on vehicle x yesterday,
experienced right shoulder pain. Unable to turn key in car or shut
car door. Pt has had a sore shoulder x years ago. Pt has no previous
injury or surgery on right shoulder.

EXAM:
RIGHT SHOULDER - 2+ VIEW

[shoulder ap]
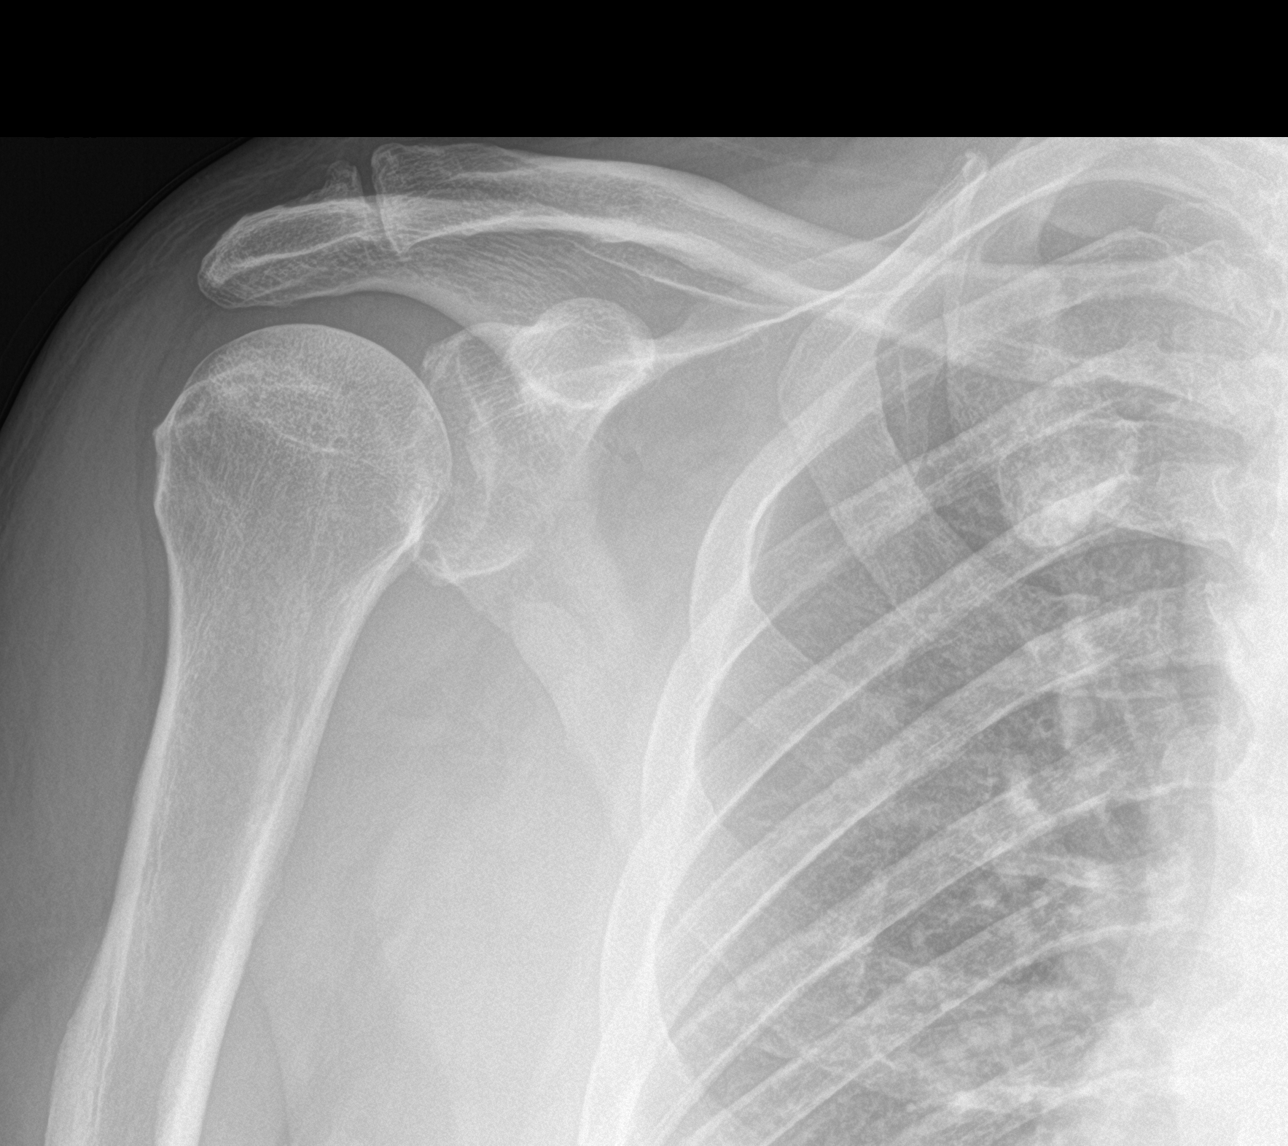

[shoulder grashey]
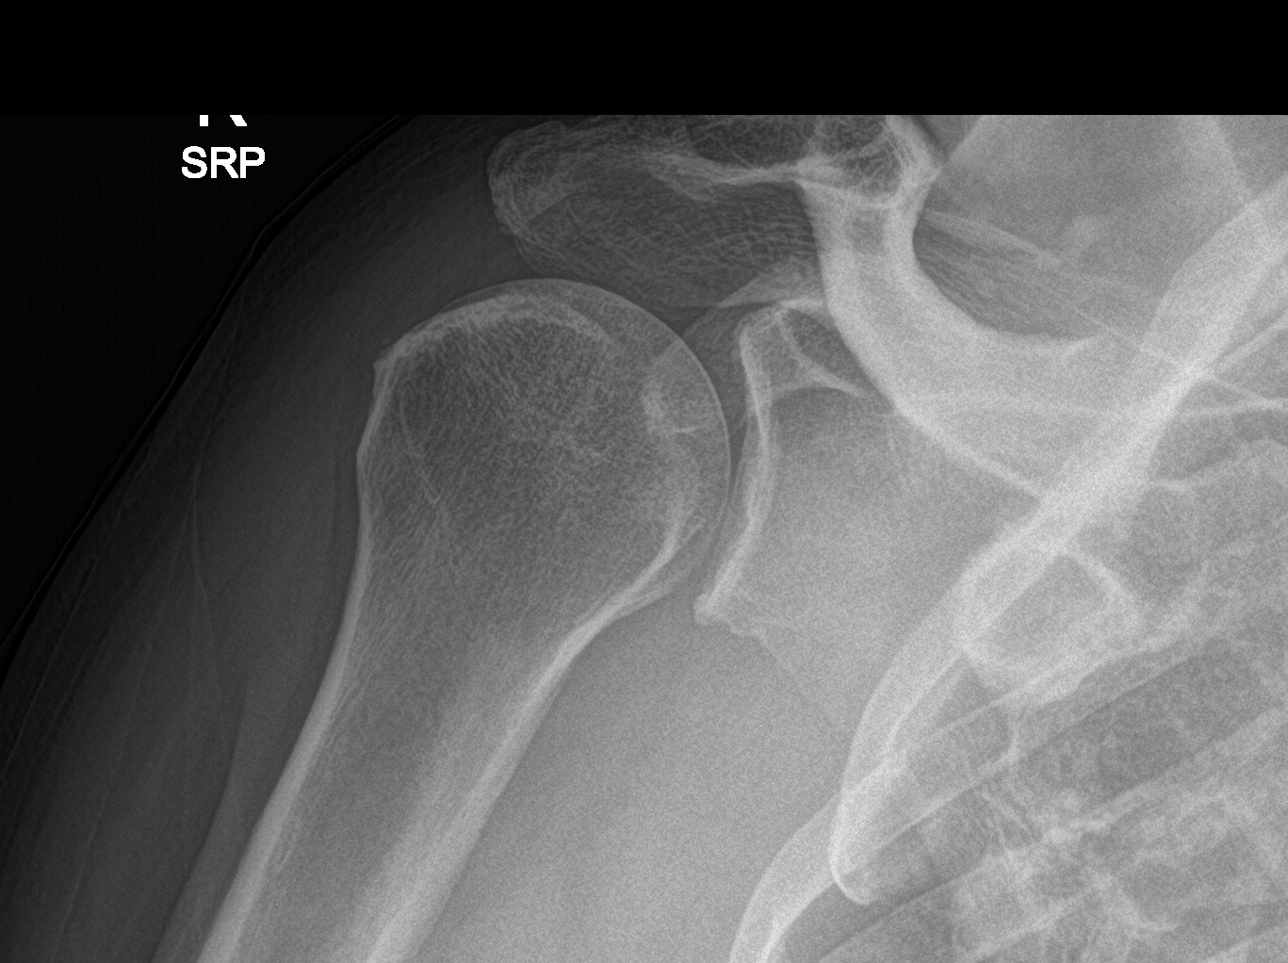

[shoulder y-view]
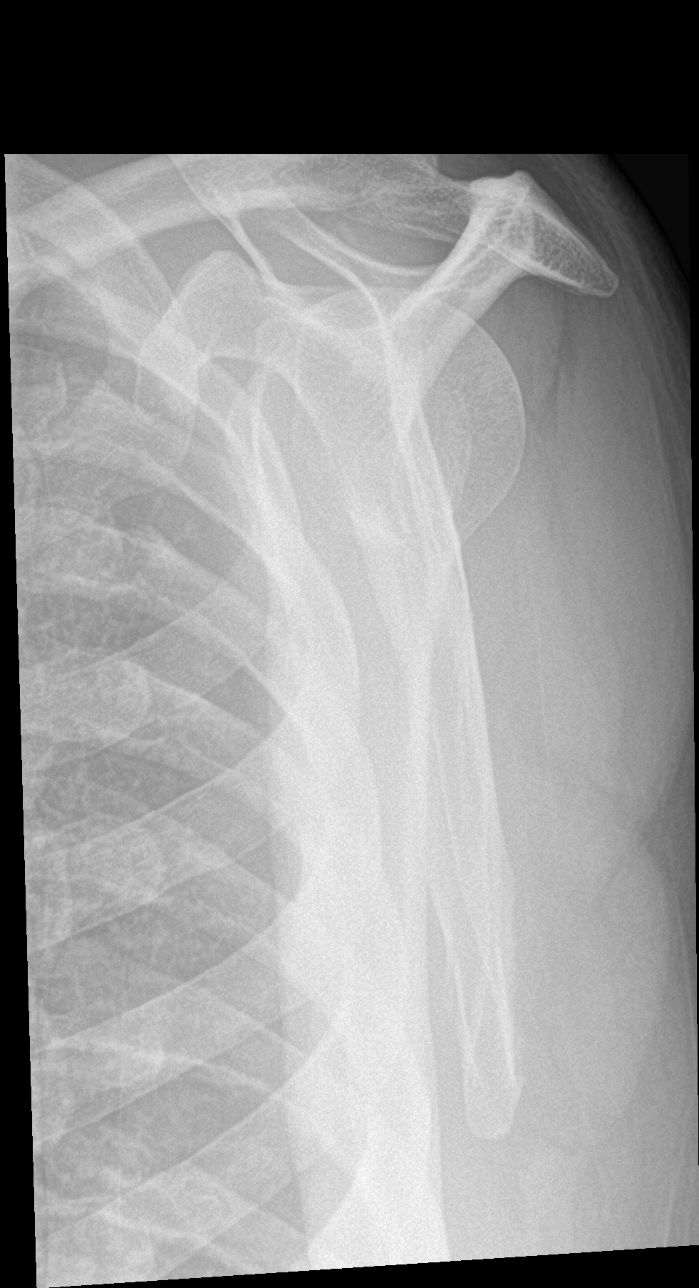

[shoulder axial]
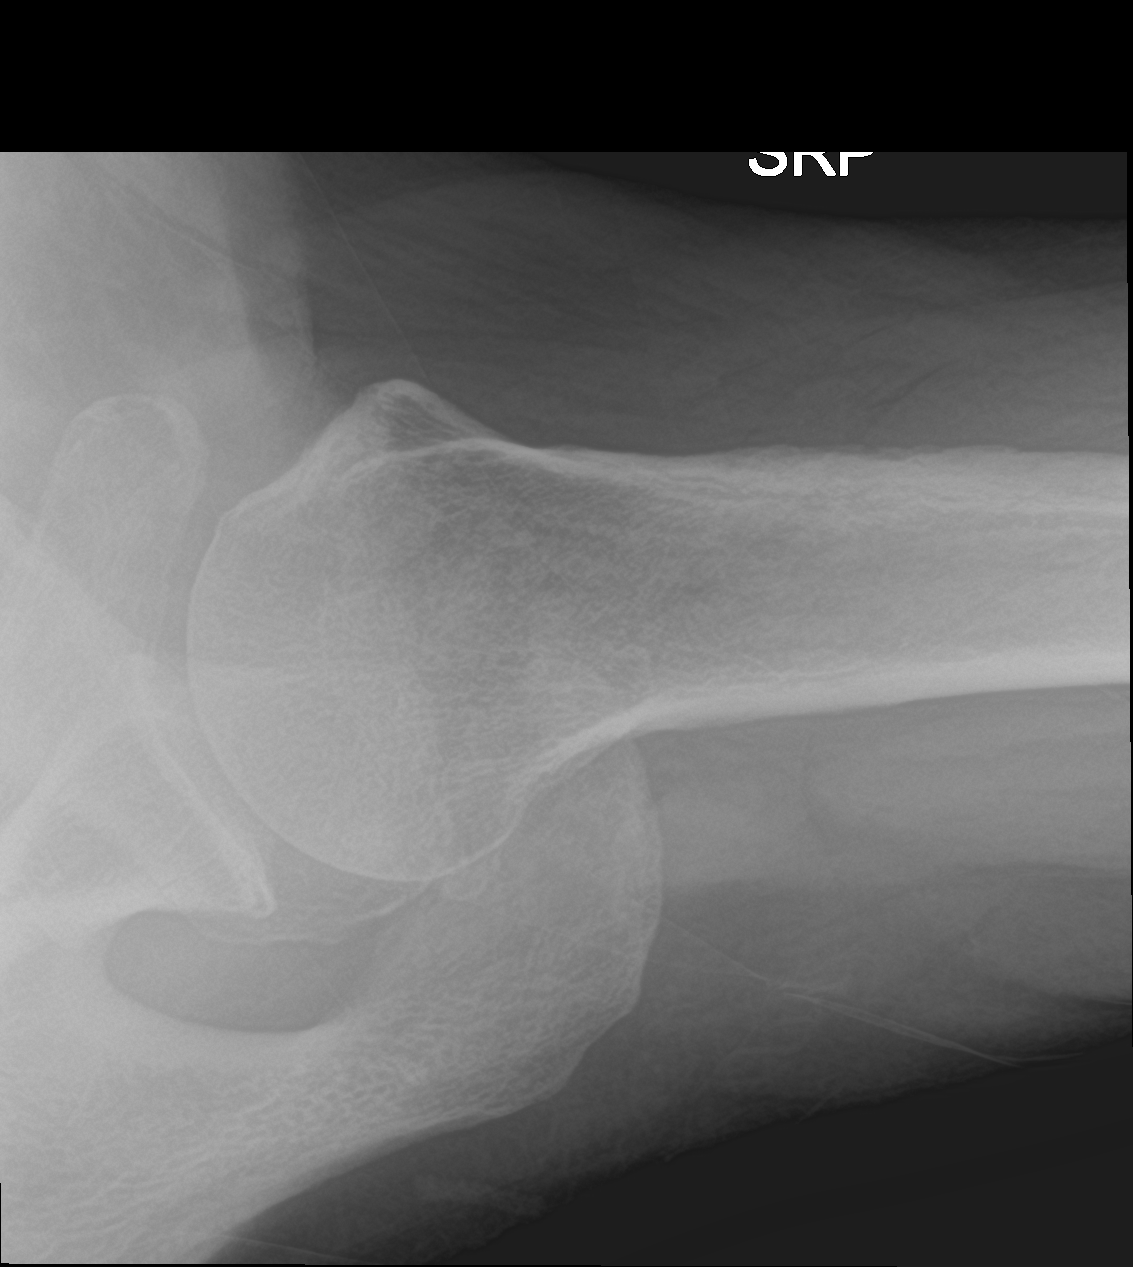

[4 of 4 positions shown; findings below may reference images not displayed]

FINDINGS: No acute fracture or dislocation. Mild arthropathy of the
acromioclavicular joint. Mild osteoarthritis of the glenohumeral
joint. No aggressive osseous lesion. Soft tissues are normal.
IMPRESSION: No acute osseous injury of the right shoulder.

## 2020-05-15 DIAGNOSIS — M48061 Spinal stenosis, lumbar region without neurogenic claudication: Secondary | ICD-10-CM | POA: Diagnosis not present

## 2020-06-13 DIAGNOSIS — Z23 Encounter for immunization: Secondary | ICD-10-CM | POA: Diagnosis not present

## 2020-09-11 ENCOUNTER — Other Ambulatory Visit: Payer: Self-pay | Admitting: Family Medicine

## 2020-09-11 DIAGNOSIS — Z125 Encounter for screening for malignant neoplasm of prostate: Secondary | ICD-10-CM

## 2020-09-11 DIAGNOSIS — M1A00X0 Idiopathic chronic gout, unspecified site, without tophus (tophi): Secondary | ICD-10-CM

## 2020-09-11 DIAGNOSIS — E1169 Type 2 diabetes mellitus with other specified complication: Secondary | ICD-10-CM

## 2020-09-11 DIAGNOSIS — E118 Type 2 diabetes mellitus with unspecified complications: Secondary | ICD-10-CM

## 2020-09-11 DIAGNOSIS — E785 Hyperlipidemia, unspecified: Secondary | ICD-10-CM

## 2020-09-12 ENCOUNTER — Ambulatory Visit (INDEPENDENT_AMBULATORY_CARE_PROVIDER_SITE_OTHER): Payer: Medicare Other

## 2020-09-12 ENCOUNTER — Other Ambulatory Visit: Payer: Self-pay

## 2020-09-12 ENCOUNTER — Other Ambulatory Visit (INDEPENDENT_AMBULATORY_CARE_PROVIDER_SITE_OTHER): Payer: Medicare Other

## 2020-09-12 DIAGNOSIS — M1A00X0 Idiopathic chronic gout, unspecified site, without tophus (tophi): Secondary | ICD-10-CM

## 2020-09-12 DIAGNOSIS — E1169 Type 2 diabetes mellitus with other specified complication: Secondary | ICD-10-CM | POA: Diagnosis not present

## 2020-09-12 DIAGNOSIS — Z125 Encounter for screening for malignant neoplasm of prostate: Secondary | ICD-10-CM

## 2020-09-12 DIAGNOSIS — Z0000 Encounter for general adult medical examination without abnormal findings: Secondary | ICD-10-CM

## 2020-09-12 DIAGNOSIS — E785 Hyperlipidemia, unspecified: Secondary | ICD-10-CM | POA: Diagnosis not present

## 2020-09-12 DIAGNOSIS — E118 Type 2 diabetes mellitus with unspecified complications: Secondary | ICD-10-CM

## 2020-09-12 LAB — COMPREHENSIVE METABOLIC PANEL
ALT: 32 U/L (ref 0–53)
AST: 24 U/L (ref 0–37)
Albumin: 4.3 g/dL (ref 3.5–5.2)
Alkaline Phosphatase: 64 U/L (ref 39–117)
BUN: 25 mg/dL — ABNORMAL HIGH (ref 6–23)
CO2: 29 mEq/L (ref 19–32)
Calcium: 9.2 mg/dL (ref 8.4–10.5)
Chloride: 102 mEq/L (ref 96–112)
Creatinine, Ser: 1.03 mg/dL (ref 0.40–1.50)
GFR: 73.26 mL/min (ref >60.00)
Glucose, Bld: 108 mg/dL — ABNORMAL HIGH (ref 70–99)
Potassium: 4.5 mEq/L (ref 3.5–5.1)
Sodium: 137 mEq/L (ref 135–145)
Total Bilirubin: 0.6 mg/dL (ref 0.2–1.2)
Total Protein: 6.8 g/dL (ref 6.0–8.3)

## 2020-09-12 LAB — URIC ACID: Uric Acid, Serum: 6.1 mg/dL (ref 4.0–7.8)

## 2020-09-12 LAB — LIPID PANEL
Cholesterol: 140 mg/dL (ref 0–200)
HDL: 27.9 mg/dL — ABNORMAL LOW (ref >39.00)
LDL Cholesterol: 76 mg/dL (ref 0–99)
NonHDL: 111.87
Total CHOL/HDL Ratio: 5
Triglycerides: 177 mg/dL — ABNORMAL HIGH (ref 0.0–149.0)
VLDL: 35.4 mg/dL (ref 0.0–40.0)

## 2020-09-12 LAB — HEMOGLOBIN A1C: Hgb A1c MFr Bld: 6.3 % (ref 4.6–6.5)

## 2020-09-12 LAB — PSA, MEDICARE: PSA: 1.15 ng/ml (ref 0.10–4.00)

## 2020-09-12 NOTE — Progress Notes (Signed)
PCP notes:  Health Maintenance: Flu- due Colonoscopy- declined  Eye exams- scheduled 09/19/2020   Abnormal Screenings: none   Patient concerns: none   Nurse concerns: none   Next PCP appt.: 09/16/2020 @ 9:30 am

## 2020-09-12 NOTE — Progress Notes (Addendum)
Subjective:   Ronald Bullock. is a 70 y.o. male who presents for Medicare Annual/Subsequent preventive examination.  Review of Systems: N/A      I connected with the patient today by telephone and verified that I am speaking with the correct person using two identifiers. Location patient: home Location nurse: work Persons participating in the telephone visit: patient, nurse.   I discussed the limitations, risks, security and privacy concerns of performing an evaluation and management service by telephone and the availability of in person appointments. I also discussed with the patient that there may be a patient responsible charge related to this service. The patient expressed understanding and verbally consented to this telephonic visit.        Cardiac Risk Factors include: advanced age (>69men, >29 women);male gender;diabetes mellitus;Other (see comment), Risk factor comments: hyperlipidemia     Objective:    Today's Vitals   There is no height or weight on file to calculate BMI.  Advanced Directives 09/12/2020 09/05/2019 08/30/2018 08/25/2017 08/04/2016 12/24/2014 03/28/2013  Does Patient Have a Medical Advance Directive? Yes Yes Yes No No No Patient has advance directive, copy not in chart  Type of Advance Directive [place];Living will [place];Living will [place] in Chart? Yes - validated most recent copy scanned in chart (See row information) Yes - validated most recent copy scanned in chart (See row information) No - copy requested - - - Copy requested from other (Comment)  Would patient like information on creating a medical advance directive? - - - No - Patient declined - - -  Pre-existing out of facility DNR order (yellow form or pink MOST form) - - - - - - -    Current Medications (verified) Outpatient Encounter Medications as of 09/12/2020  Medication Sig  .  acetaminophen (TYLENOL) 500 MG tablet Take 500 mg by mouth 2 (two) times daily as needed.  Marland Kitchen allopurinol (ZYLOPRIM) 300 MG tablet Take 1 tablet (300 mg total) by mouth daily.  . ASPIRIN 81 PO Take by mouth daily.  . colchicine 0.6 MG tablet TAKE 1 TABLET (0.6 MG TOTAL) BY MOUTH 2 (TWO) TIMES DAILY AS NEEDED.  Marland Kitchen lisinopril (ZESTRIL) 5 MG tablet Take 1 tablet (5 mg total) by mouth daily.  . Multiple Vitamins-Minerals (CENTRUM SILVER 50+MEN PO) Take by mouth daily.  . Omega-3 Fatty Acids (FISH OIL) 1200 MG CAPS Take 2 capsules (2,400 mg total) by mouth daily.   No facility-administered encounter medications on file as of 09/12/2020.    Allergies (verified) Omeprazole   History: Past Medical History:  Diagnosis Date  . Arthritis    L end stage knee DKD, mild R knee DJD, R hip end stage DJD  . DDD (degenerative disc disease), cervical    multilevel  . Gout    rare flares  . HLD (hyperlipidemia)    Low HDL  . Hyperglycemia   . Hypertension   . Lumbar spinal stenosis 11/2014   severe central canal stenosis L3/4;L4/5 bilat facet hypertrophy, annular disc bulging, grade 1 sphondylolisthesis (Dumonski) planned PT and ESI Mina Marble)  . OSA on CPAP   . Osteoarthritis    R knee and hip, s/p L TKR   Past Surgical History:  Procedure Laterality Date  . cardiolyte  1997   WNL  . carotid US  1997   WNL  . CATARACT EXTRACTION W/PHACO Left 12/24/2014   Procedure:  CATARACT EXTRACTION PHACO AND INTRAOCULAR LENS PLACEMENT (IOC);  Surgeon: Birder Robson, MD; [place] 01:16AP% 26.4Cde 20.17  . COLONOSCOPY  1998   WNL  . COLONOSCOPY  06/2010   WNL, internal hemorrhoids, rec rpt 10 yrs  . CT HEAD LIMITED W/CM  1997   WNL  . ESOPHAGOGASTRODUODENOSCOPY  1998   gastric polyps, benign  . JOINT REPLACEMENT  1968   Left Knee, cartilage removed  . KNEE ARTHROSCOPY  1968   cartilage removal  . LUMBAR EPIDURAL INJECTION  02/2017   L3-4 interlaminar injection Mina Marble)  . MRI lumbar  2000   mild bulge L3/4,  mild foraminal narrowing L4/5, L5/S2, small disk herniation  . MRI lumbar  11/2014   severe L3/4, L4/5 central canal stenosis (Dumonski)  . TOTAL HIP ARTHROPLASTY Right 03/27/2013   TOTAL HIP ARTHROPLASTY ANTERIOR APPROACH;  Surgeon: Hessie Dibble, MD  . TOTAL KNEE ARTHROPLASTY Left 09/26/2012   TOTAL KNEE ARTHROPLASTY;  Surgeon: Hessie Dibble, MD   Family History  Problem Relation Age of Onset  . Stroke Father 48  . Diabetes Father   . Asthma Mother   . Cancer Mother        Vaginal; radiation dz of bowel  . Obesity Sister    Social History   Socioeconomic History  . Marital status: Married    Spouse name: Not on file  . Number of children: 4  . Years of education: college  . Highest education level: Not on file  Occupational History  . Occupation: Database administrator in [employer]  . Smoking status: Never Smoker  . Smokeless tobacco: Never Used  Vaping Use  . Vaping Use: Never used  Substance and Sexual Activity  . Alcohol use: No    Comment: Rare  . Drug use: No  . Sexual activity: Never  Other Topics Concern  . Not on file  Social History Narrative   Lives with wife, has grown son   Activity: no regular exercise   Diet: good water, some fish, good vegetables, red meat 3x/wk   Right-handed.   Two 32-ounce glasses of tea.   Social Determinants of Health   Financial Resource Strain: Low Risk   . Difficulty of Paying Living Expenses: Not hard at all  Food Insecurity: No Food Insecurity  . Worried About Charity fundraiser in the Last Year: Never true  . Ran Out of Food in the Last Year: Never true  Transportation Needs: No Transportation Needs  . Lack of Transportation (Medical): No  . Lack of Transportation (Non-Medical): No  Physical Activity: Inactive  . Days of Exercise per Week: 0 days  . Minutes of Exercise per Session: 0 min  Stress: No Stress Concern Present  . Feeling of Stress : Not at all  Social  Connections: Not on file    Tobacco Counseling Counseling given: Not Answered   Clinical Intake:  Pre-visit preparation completed: Yes  Pain : 0-10 Pain Type: Chronic pain Pain Location: Back Pain Orientation: Lower Pain Descriptors / Indicators: Aching Pain Onset: More than a month ago Pain Frequency: Intermittent     Nutritional Risks: None Diabetes: Yes CBG done?: No Did pt. bring in CBG monitor from home?: No  How often do you need to have someone help you when you read instructions, pamphlets, or other written materials from your doctor or pharmacy?: 1 - Never  Diabetic: Yes Nutrition Risk Assessment:   Has the patient had any N/V/D within the last  2 months?  No  Does the patient have any non-healing wounds?  No  Has the patient had any unintentional weight loss or weight gain?  No   Diabetes:  Is the patient diabetic?  Yes  If diabetic, was a CBG obtained today?  No  telephone visit  Did the patient bring in their glucometer from home?  No  telephone visit  How often do you monitor your CBG's? never.   Financial Strains and Diabetes Management:  Are you having any financial strains with the device, your supplies or your medication? No .  Does the patient want to be seen by Chronic Care Management for management of their diabetes?  No  Would the patient like to be referred to a Nutritionist or for Diabetic Management?  No   Diabetic Exams:  Diabetic Eye Exam: scheduled 09/19/2020 Diabetic Foot Exam: Completed 03/10/2020   Interpreter Needed?: No  Information entered by :: CJohnson, LPN   Activities of Daily Living In your present state of health, do you have any difficulty performing the following activities: 09/12/2020  Hearing? N  Vision? N  Difficulty concentrating or making decisions? N  Walking or climbing stairs? N  Dressing or bathing? N  Doing errands, shopping? N  Preparing Food and eating ? N  Using the Toilet? N  In the past six months,  have you accidently leaked urine? N  Do you have problems with loss of bowel control? N  Managing your Medications? N  Managing your Finances? N  Housekeeping or managing your Housekeeping? N  Some recent data might be hidden    Patient Care Team: [name], MD as PCP - General Arelia Sneddon, OD as Consulting Physician (Optometry) Melrose Nakayama, MD as Consulting Physician (Orthopedic Surgery) Phylliss Bob, MD as Consulting Physician (Orthopedic Surgery) Normajean Glasgow, MD as Attending Physician (Physical Medicine and Rehabilitation) Eula Listen, DDS as Referring Physician (Dentistry)  Indicate any recent Medical Services you may have received from other than [facility] providers in the past year (date may be approximate).     Assessment:   This is a routine wellness examination for Ronald Bullock.  Hearing/Vision screen  Hearing Screening   125Hz  250Hz  500Hz  1000Hz  2000Hz  3000Hz  4000Hz  6000Hz  8000Hz   Right ear:           Left ear:           Vision Screening Comments: Patient gets annual eye exams   Dietary issues and exercise activities discussed: Current Exercise Habits: The patient does not participate in regular exercise at present, Exercise limited by: None identified  Goals    . Patient Stated     Starting 08/30/2018, I will continue to take medications as prescribed.     . Patient Stated     09/05/2019, I will maintain and continue medications as prescribed.     . Patient Stated     09/12/2020, I will maintain and continue medications as prescribed.       Depression Screen PHQ 2/9 Scores 09/12/2020 09/05/2019 08/30/2018 08/25/2017 08/04/2016 06/24/2016  PHQ - 2 Score 0 0 0 0 0 0  PHQ- 9 Score 0 0 0 0 - -    Fall Risk Fall Risk  09/12/2020 09/05/2019 08/30/2018 08/25/2017 08/04/2016  Falls in the past year? 0 0 0 No No  Comment - - - - -  Number falls in past yr: 0 0 - - -  Injury with Fall? 0 0 - - -  Risk for fall due to : Medication side  effect Medication side  effect - - -  Follow up Falls evaluation completed;Falls prevention discussed Falls evaluation completed;Falls prevention discussed - - -    FALL RISK PREVENTION PERTAINING TO THE HOME:  Any stairs in or around the home? Yes  If so, are there any without handrails? No  Home free of loose throw rugs in walkways, pet beds, electrical cords, etc? Yes  Adequate lighting in your home to reduce risk of falls? Yes   ASSISTIVE DEVICES UTILIZED TO PREVENT FALLS:  Life alert? No  Use of a cane, walker or w/c? No  Grab bars in the bathroom? No  Shower chair or bench in shower? No  Elevated toilet seat or a handicapped toilet? No   TIMED UP AND GO:  Was the test performed? N/A telephone visit .   Cognitive Function: MMSE - Mini Mental State Exam 09/12/2020 09/05/2019 08/30/2018 08/25/2017 08/04/2016  Orientation to time 5 5 5 5 5   Orientation to Place 5 5 5 5 5   Registration 3 3 3 3 3   Attention/ Calculation 5 5 0 0 0  Recall 3 3 3 2 3   Recall-comments - - - unable to recall 1 of 3 words -  Language- name 2 objects - - 0 0 0  Language- repeat 1 1 1 1 1   Language- follow 3 step command - - 3 3 3   Language- read & follow direction - - 0 0 0  Write a sentence - - 0 0 0  Copy design - - 0 0 0  Total score - - 20 19 20   Mini Cog  Mini-Cog screen was completed. Maximum score is 22. A value of 0 denotes this part of the MMSE was not completed or the patient failed this part of the Mini-Cog screening.       Immunizations Immunization History  Administered Date(s) Administered  . Influenza, High Dose Seasonal PF 06/16/2018, 08/03/2019  . PFIZER(Purple Top)SARS-COV-2 Vaccination 09/27/2019, 10/18/2019, 06/13/2020  . Pneumococcal Conjugate-13 02/05/2020  . Td 07/16/2001, 11/18/2008  . Tdap 03/03/2014  . Zoster Recombinat (Shingrix) 03/10/2020, 07/01/2020    TDAP status: Up to date  Flu Vaccine status: Due, Education has been provided regarding the importance of this vaccine. Advised  may receive this vaccine at local pharmacy or Health Dept. Aware to provide a copy of the vaccination record if obtained from local pharmacy or Health Dept. Verbalized acceptance and understanding.  Pneumococcal vaccine status: Up to date  Covid-19 vaccine status: Completed vaccines  Qualifies for Shingles Vaccine? Yes   Zostavax completed No   Shingrix Completed?: Yes  Screening Tests Health Maintenance  Topic Date Due  . OPHTHALMOLOGY EXAM  10/15/2019  . INFLUENZA VACCINE  03/16/2020  . HEMOGLOBIN A1C  09/10/2020  . COLONOSCOPY (Pts 45-60yrs Insurance coverage will need to be confirmed)  09/12/2021 (Originally 07/14/2020)  . PNA vac Low Risk Adult (2 of 2 - PPSV23) 02/04/2021  . FOOT EXAM  03/10/2021  . TETANUS/TDAP  03/03/2024  . COVID-19 Vaccine  Completed  . Hepatitis C Screening  Completed    Health Maintenance  Health Maintenance Due  Topic Date Due  . OPHTHALMOLOGY EXAM  10/15/2019  . INFLUENZA VACCINE  03/16/2020  . HEMOGLOBIN A1C  09/10/2020    Colorectal cancer screening: declined at this time  Lung Cancer Screening: (Low Dose CT Chest recommended if Age 54-80 years, 30 pack-year currently smoking OR have quit w/in 15 years.) does not qualify.    Additional Screening:  Hepatitis C Screening: does  qualify; Completed 06/16/2015  Vision Screening: Recommended annual ophthalmology exams for early detection of glaucoma and other disorders of the eye. Is the patient up to date with their annual eye exam?  Yes  Who is the provider or what is the name of the office in which the patient attends annual eye exams? Dr. Arelia Sneddon If pt is not established with a provider, would they like to be referred to a provider to establish care? No .   Dental Screening: Recommended annual dental exams for proper oral hygiene  Community Resource Referral / Chronic Care Management: CRR required this visit?  No   CCM required this visit?  No      Plan:     I have  personally reviewed and noted the following in the patient's chart:   . Medical and social history . Use of alcohol, tobacco or illicit drugs  . Current medications and supplements . Functional ability and status . Nutritional status . Physical activity . Advanced directives . List of other physicians . Hospitalizations, surgeries, and ER visits in previous 12 months . Vitals . Screenings to include cognitive, depression, and falls . Referrals and appointments  In addition, I have reviewed and discussed with patient certain preventive protocols, quality metrics, and best practice recommendations. A written personalized care plan for preventive services as well as general preventive health recommendations were provided to patient.   Due to this being a telephonic visit, the after visit summary with patients personalized plan was offered to patient via office or my-chart. Patient preferred to pick up at office at next visit or via mychart.   Andrez Grime, LPN   [id number]

## 2020-09-12 NOTE — Patient Instructions (Signed)
[name] , Thank you for taking time to come for your Medicare Wellness Visit. I appreciate your ongoing commitment to your health goals. Please review the following plan we discussed and let me know if I can assist you in the future.   Screening recommendations/referrals: Colonoscopy: declined Recommended yearly ophthalmology/optometry visit for glaucoma screening and checkup Recommended yearly dental visit for hygiene and checkup  Vaccinations: Influenza vaccine: due, will get at physical  Pneumococcal vaccine: Up to date, completed 02/05/2020, due 01/2021 Tdap vaccine: Up to date, completed 03/03/2014, due 02/2024 Shingles vaccine: Completed series   Covid-19: Completed series  Advanced directives: copy in chart  Conditions/risks identified: diabetes, hyperlipidemia   Next appointment: Follow up in one year for your annual wellness visit.   Preventive Care 4 Years and Older, Male Preventive care refers to lifestyle choices and visits with your health care provider that can promote health and wellness. What does preventive care include?  A yearly physical exam. This is also called an annual well check.  Dental exams once or twice a year.  Routine eye exams. Ask your health care provider how often you should have your eyes checked.  Personal lifestyle choices, including:  Daily care of your teeth and gums.  Regular physical activity.  Eating a healthy diet.  Avoiding tobacco and drug use.  Limiting alcohol use.  Practicing safe sex.  Taking low doses of aspirin every day.  Taking vitamin and mineral supplements as recommended by your health care provider. What happens during an annual well check? The services and screenings done by your health care provider during your annual well check will depend on your age, overall health, lifestyle risk factors, and family history of disease. Counseling  Your health care provider may ask you questions about your:  Alcohol  use.  Tobacco use.  Drug use.  Emotional well-being.  Home and relationship well-being.  Sexual activity.  Eating habits.  History of falls.  Memory and ability to understand (cognition).  Work and work Statistician. Screening  You may have the following tests or measurements:  Height, weight, and BMI.  Blood pressure.  Lipid and cholesterol levels. These may be checked every 5 years, or more frequently if you are over 11 years old.  Skin check.  Lung cancer screening. You may have this screening every year starting at age 64 if you have a 30-pack-year history of smoking and currently smoke or have quit within the past 15 years.  Fecal occult blood test (FOBT) of the stool. You may have this test every year starting at age 57.  Flexible sigmoidoscopy or colonoscopy. You may have a sigmoidoscopy every 5 years or a colonoscopy every 10 years starting at age 64.  Prostate cancer screening. Recommendations will vary depending on your family history and other risks.  Hepatitis C blood test.  Hepatitis B blood test.  Sexually transmitted disease (STD) testing.  Diabetes screening. This is done by checking your blood sugar (glucose) after you have not eaten for a while (fasting). You may have this done every 1-3 years.  Abdominal aortic aneurysm (AAA) screening. You may need this if you are a current or former smoker.  Osteoporosis. You may be screened starting at age 33 if you are at high risk. Talk with your health care provider about your test results, treatment options, and if necessary, the need for more tests. Vaccines  Your health care provider may recommend certain vaccines, such as:  Influenza vaccine. This is recommended every year.  Tetanus, diphtheria, and acellular pertussis (Tdap, Td) vaccine. You may need a Td booster every 10 years.  Zoster vaccine. You may need this after age 31.  Pneumococcal 13-valent conjugate (PCV13) vaccine. One dose is  recommended after age 15.  Pneumococcal polysaccharide (PPSV23) vaccine. One dose is recommended after age 79. Talk to your health care provider about which screenings and vaccines you need and how often you need them. This information is not intended to replace advice given to you by your health care provider. Make sure you discuss any questions you have with your health care provider. Document Released: 08/29/2015 Document Revised: 04/21/2016 Document Reviewed: 06/03/2015 Elsevier Interactive Patient Education  2017 [place] Prevention in the Home Falls can cause injuries. They can happen to people of all ages. There are many things you can do to make your home safe and to help prevent falls. What can I do on the outside of my home?  Regularly fix the edges of walkways and driveways and fix any cracks.  Remove anything that might make you trip as you walk through a door, such as a raised step or threshold.  Trim any bushes or trees on the path to your home.  Use bright outdoor lighting.  Clear any walking paths of anything that might make someone trip, such as rocks or tools.  Regularly check to see if handrails are loose or broken. Make sure that both sides of any steps have handrails.  Any raised decks and porches should have guardrails on the edges.  Have any leaves, snow, or ice cleared regularly.  Use sand or salt on walking paths during winter.  Clean up any spills in your garage right away. This includes oil or grease spills. What can I do in the bathroom?  Use night lights.  Install grab bars by the toilet and in the tub and shower. Do not use towel bars as grab bars.  Use non-skid mats or decals in the tub or shower.  If you need to sit down in the shower, use a plastic, non-slip stool.  Keep the floor dry. Clean up any water that spills on the floor as soon as it happens.  Remove soap buildup in the tub or shower regularly.  Attach bath mats  securely with double-sided non-slip rug tape.  Do not have throw rugs and other things on the floor that can make you trip. What can I do in the bedroom?  Use night lights.  Make sure that you have a light by your bed that is easy to reach.  Do not use any sheets or blankets that are too big for your bed. They should not hang down onto the floor.  Have a firm chair that has side arms. You can use this for support while you get dressed.  Do not have throw rugs and other things on the floor that can make you trip. What can I do in the kitchen?  Clean up any spills right away.  Avoid walking on wet floors.  Keep items that you use a lot in easy-to-reach places.  If you need to reach something above you, use a strong step stool that has a grab bar.  Keep electrical cords out of the way.  Do not use floor polish or wax that makes floors slippery. If you must use wax, use non-skid floor wax.  Do not have throw rugs and other things on the floor that can make you trip. What can I do  with my stairs?  Do not leave any items on the stairs.  Make sure that there are handrails on both sides of the stairs and use them. Fix handrails that are broken or loose. Make sure that handrails are as long as the stairways.  Check any carpeting to make sure that it is firmly attached to the stairs. Fix any carpet that is loose or worn.  Avoid having throw rugs at the top or bottom of the stairs. If you do have throw rugs, attach them to the floor with carpet tape.  Make sure that you have a light switch at the top of the stairs and the bottom of the stairs. If you do not have them, ask someone to add them for you. What else can I do to help prevent falls?  Wear shoes that:  Do not have high heels.  Have rubber bottoms.  Are comfortable and fit you well.  Are closed at the toe. Do not wear sandals.  If you use a stepladder:  Make sure that it is fully opened. Do not climb a closed  stepladder.  Make sure that both sides of the stepladder are locked into place.  Ask someone to hold it for you, if possible.  Clearly mark and make sure that you can see:  Any grab bars or handrails.  First and last steps.  Where the edge of each step is.  Use tools that help you move around (mobility aids) if they are needed. These include:  Canes.  Walkers.  Scooters.  Crutches.  Turn on the lights when you go into a dark area. Replace any light bulbs as soon as they burn out.  Set up your furniture so you have a clear path. Avoid moving your furniture around.  If any of your floors are uneven, fix them.  If there are any pets around you, be aware of where they are.  Review your medicines with your doctor. Some medicines can make you feel dizzy. This can increase your chance of falling. Ask your doctor what other things that you can do to help prevent falls. This information is not intended to replace advice given to you by your health care provider. Make sure you discuss any questions you have with your health care provider. Document Released: 05/29/2009 Document Revised: 01/08/2016 Document Reviewed: 09/06/2014 Elsevier Interactive Patient Education  2017 [employer].

## 2020-09-16 ENCOUNTER — Ambulatory Visit (INDEPENDENT_AMBULATORY_CARE_PROVIDER_SITE_OTHER): Payer: Medicare Other | Admitting: Family Medicine

## 2020-09-16 ENCOUNTER — Encounter: Payer: Self-pay | Admitting: Family Medicine

## 2020-09-16 ENCOUNTER — Other Ambulatory Visit: Payer: Self-pay

## 2020-09-16 VITALS — BP 134/64 | HR 74 | Temp 97.5°F | Ht 71.5 in | Wt 230.6 lb

## 2020-09-16 DIAGNOSIS — G4733 Obstructive sleep apnea (adult) (pediatric): Secondary | ICD-10-CM | POA: Diagnosis not present

## 2020-09-16 DIAGNOSIS — E669 Obesity, unspecified: Secondary | ICD-10-CM

## 2020-09-16 DIAGNOSIS — M48062 Spinal stenosis, lumbar region with neurogenic claudication: Secondary | ICD-10-CM | POA: Diagnosis not present

## 2020-09-16 DIAGNOSIS — M159 Polyosteoarthritis, unspecified: Secondary | ICD-10-CM

## 2020-09-16 DIAGNOSIS — E1169 Type 2 diabetes mellitus with other specified complication: Secondary | ICD-10-CM | POA: Diagnosis not present

## 2020-09-16 DIAGNOSIS — E785 Hyperlipidemia, unspecified: Secondary | ICD-10-CM

## 2020-09-16 DIAGNOSIS — M1A00X0 Idiopathic chronic gout, unspecified site, without tophus (tophi): Secondary | ICD-10-CM | POA: Diagnosis not present

## 2020-09-16 DIAGNOSIS — Z23 Encounter for immunization: Secondary | ICD-10-CM

## 2020-09-16 DIAGNOSIS — R7303 Prediabetes: Secondary | ICD-10-CM

## 2020-09-16 DIAGNOSIS — I10 Essential (primary) hypertension: Secondary | ICD-10-CM

## 2020-09-16 DIAGNOSIS — M8949 Other hypertrophic osteoarthropathy, multiple sites: Secondary | ICD-10-CM

## 2020-09-16 DIAGNOSIS — I679 Cerebrovascular disease, unspecified: Secondary | ICD-10-CM

## 2020-09-16 NOTE — Patient Instructions (Addendum)
If it's been several years, touch base with Dr Janeice Robinson office about follow up plan.  Flu shot today.  Contact us in a few months when ready for dermatology referral.  Good to see you today  Return as needed or in 6 months for diabetes follow up visit   Health Maintenance After Age 72 After age 64, you are at a higher risk for certain long-term diseases and infections as well as injuries from falls. Falls are a major cause of broken bones and head injuries in people who are older than age 53. Getting regular preventive care can help to keep you healthy and well. Preventive care includes getting regular testing and making lifestyle changes as recommended by your health care provider. Talk with your health care provider about:  Which screenings and tests you should have. A screening is a test that checks for a disease when you have no symptoms.  A diet and exercise plan that is right for you. What should I know about screenings and tests to prevent falls? Screening and testing are the best ways to find a health problem early. Early diagnosis and treatment give you the best chance of managing medical conditions that are common after age 5. Certain conditions and lifestyle choices may make you more likely to have a fall. Your health care provider may recommend:  Regular vision checks. Poor vision and conditions such as cataracts can make you more likely to have a fall. If you wear glasses, make sure to get your prescription updated if your vision changes.  Medicine review. Work with your health care provider to regularly review all of the medicines you are taking, including over-the-counter medicines. Ask your health care provider about any side effects that may make you more likely to have a fall. Tell your health care provider if any medicines that you take make you feel dizzy or sleepy.  Osteoporosis screening. Osteoporosis is a condition that causes the bones to get weaker. This can make the bones  weak and cause them to break more easily.  Blood pressure screening. Blood pressure changes and medicines to control blood pressure can make you feel dizzy.  Strength and balance checks. Your health care provider may recommend certain tests to check your strength and balance while standing, walking, or changing positions.  Foot health exam. Foot pain and numbness, as well as not wearing proper footwear, can make you more likely to have a fall.  Depression screening. You may be more likely to have a fall if you have a fear of falling, feel emotionally low, or feel unable to do activities that you used to do.  Alcohol use screening. Using too much alcohol can affect your balance and may make you more likely to have a fall. What actions can I take to lower my risk of falls? General instructions  Talk with your health care provider about your risks for falling. Tell your health care provider if: ? You fall. Be sure to tell your health care provider about all falls, even ones that seem minor. ? You feel dizzy, sleepy, or off-balance.  Take over-the-counter and prescription medicines only as told by your health care provider. These include any supplements.  Eat a healthy diet and maintain a healthy weight. A healthy diet includes low-fat dairy products, low-fat (lean) meats, and fiber from whole grains, beans, and lots of fruits and vegetables. Home safety  Remove any tripping hazards, such as rugs, cords, and clutter.  Install safety equipment such as grab  bars in bathrooms and safety rails on stairs.  Keep rooms and walkways well-lit. Activity  Follow a regular exercise program to stay fit. This will help you maintain your balance. Ask your health care provider what types of exercise are appropriate for you.  If you need a cane or walker, use it as recommended by your health care provider.  Wear supportive shoes that have nonskid soles.   Lifestyle  Do not drink alcohol if your  health care provider tells you not to drink.  If you drink alcohol, limit how much you have: ? 0-1 drink a day for women. ? 0-2 drinks a day for men.  Be aware of how much alcohol is in your drink. In the [place] one drink equals one typical bottle of beer (12 oz), one-half glass of wine (5 oz), or one shot of hard liquor (1 oz).  Do not use any products that contain nicotine or tobacco, such as cigarettes and e-cigarettes. If you need help quitting, ask your health care provider. Summary  Having a healthy lifestyle and getting preventive care can help to protect your health and wellness after age 61.  Screening and testing are the best way to find a health problem early and help you avoid having a fall. Early diagnosis and treatment give you the best chance for managing medical conditions that are more common for people who are older than age 51.  Falls are a major cause of broken bones and head injuries in people who are older than age 84. Take precautions to prevent a fall at home.  Work with your health care provider to learn what changes you can make to improve your health and wellness and to prevent falls. This information is not intended to replace advice given to you by your health care provider. Make sure you discuss any questions you have with your health care provider. Document Revised: 11/23/2018 Document Reviewed: 06/15/2017 Elsevier Patient Education  2021 [employer].

## 2020-09-16 NOTE — Assessment & Plan Note (Signed)
Continues aspirin. Consider statin.

## 2020-09-16 NOTE — Assessment & Plan Note (Signed)
Chronic, stable. Continue current regimen. 

## 2020-09-16 NOTE — Progress Notes (Signed)
Patient ID: Ronald Bullock., male    DOB: 18-Jan-1951, 70 y.o.   MRN: [id number]  This visit was conducted in person.  BP 134/64 (BP Location: Left Arm, Patient Position: Sitting, Cuff Size: Large)   Pulse 74   Temp (!) 97.5 F (36.4 C) (Temporal)   Ht 5' 11.5" (1.816 m)   Wt 230 lb 9 oz (104.6 kg)   SpO2 98%   BMI 31.71 kg/m    CC: CPE Subjective:   HPI: Ronald Bullock. is a 70 y.o. male presenting on 09/16/2020 for Annual Exam (Prt 2. )   Daughter diagnosed last year with breast cancer, upcoming surgery planned.  Saw health advisor last week for medicare wellness visit. Note reviewed.   No exam data present  Flowsheet Row Clinical Support from 09/12/2020 in [facility]  PHQ-2 Total Score 0      Fall Risk  09/12/2020 09/05/2019 08/30/2018 08/25/2017 08/04/2016  Falls in the past year? 0 0 0 No No  Comment - - - - -  Number falls in past yr: 0 0 - - -  Injury with Fall? 0 0 - - -  Risk for fall due to : Medication side effect Medication side effect - - -  Follow up Falls evaluation completed;Falls prevention discussed Falls evaluation completed;Falls prevention discussed - - -    Vertigo - saw neurology previously - marked improvement on its own. MR brain showed mild small vessel disease. Has started aspirin 81mg  daily.   No recent gout flares.  Ongoing lower back pain, known herniated discs. Wants to proceed with surgery. Sees ortho Dr Lynann Bologna, has had steroid shots in the past. Planning to return to ortho.   10 lb weight loss - more active after knee replacement. Activity still limited by back pain.   Notes daytime somnolence despite compliance with CPAP for OSA.   Preventative: Colonoscopy 06/2010 wnl, rpt due. Desires to defer at this time. No blood in stool. Prostate - stable, good stream, no nocturia. No fmhx. Lung cancer screening - not eligible  Flu-yearly COVID vaccine Pfizer 09/2019, 10/2019, booster 05/2020 prevnar  01/2020, will want pneumovax Tdap 2015 Shingles - 02/2020, 06/2020 Advanced directive at home, copy scanned in chart 11/2018. Ronald Bullock is HCPOA. Does not want prolonged life support if irreversible terminal condition. Healthcare agent directive should be followed over advanced directive.  Seat belt use discussed Sunscreen use discussed, no changing moles on skin. Wears hat outdoors. Would like to defer derm eval for a few months.  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 Interim medical history since our last visit reviewed. Allergies and medications reviewed and updated. Outpatient Medications Prior to Visit  Medication Sig Dispense Refill  . acetaminophen (TYLENOL) 500 MG tablet Take 500 mg by mouth 2 (two) times daily as needed.    Marland Kitchen allopurinol (ZYLOPRIM) 300 MG tablet Take 1 tablet (300 mg total) by mouth daily. 90 tablet 3  . ASPIRIN 81 PO Take by mouth daily.    . colchicine 0.6 MG tablet TAKE 1 TABLET (0.6 MG TOTAL) BY MOUTH 2 (TWO) TIMES DAILY AS NEEDED. 30 tablet 3  .  lisinopril (ZESTRIL) 5 MG tablet Take 1 tablet (5 mg total) by mouth daily. 90 tablet 3  . Multiple Vitamins-Minerals (CENTRUM SILVER 50+MEN PO) Take by mouth daily.    . Omega-3 Fatty Acids (FISH OIL) 1200 MG CAPS Take 2 capsules (2,400 mg total) by mouth daily.     No facility-administered medications prior to visit.     Per HPI unless specifically indicated in ROS section below Review of Systems Objective:  BP 134/64 (BP Location: Left Arm, Patient Position: Sitting, Cuff Size: Large)   Pulse 74   Temp (!) 97.5 F (36.4 C) (Temporal)   Ht 5' 11.5" (1.816 m)   Wt 230 lb 9 oz  (104.6 kg)   SpO2 98%   BMI 31.71 kg/m   Wt Readings from Last 3 Encounters:  09/16/20 230 lb 9 oz (104.6 kg)  03/10/20 (!) 240 lb 14.4 oz (109.3 kg)  09/11/19 236 lb 5 oz (107.2 kg)      Physical Exam Vitals and nursing note reviewed.  Constitutional:      General: He is not in acute distress.    Appearance: Normal appearance. He is well-developed and well-nourished. He is not ill-appearing.  HENT:     Head: Normocephalic and atraumatic.     Right Ear: Hearing, tympanic membrane, ear canal and external ear normal.     Left Ear: Hearing, tympanic membrane, ear canal and external ear normal. There is impacted cerumen.     Ears:     Comments: Cerumen impaction cleared using plastic curette, pt tolerated well    Mouth/Throat:     Mouth: Oropharynx is clear and moist and mucous membranes are normal.     Pharynx: Uvula midline. No posterior oropharyngeal edema.  Eyes:     General: No scleral icterus.    Extraocular Movements: Extraocular movements intact and EOM normal.     Conjunctiva/sclera: Conjunctivae normal.     Pupils: Pupils are equal, round, and reactive to light.  Neck:     Thyroid: No thyroid mass, thyromegaly or thyroid tenderness.     Vascular: No carotid bruit.  Cardiovascular:     Rate and Rhythm: Normal rate and regular rhythm.     Pulses: Normal pulses and intact distal pulses.          Radial pulses are 2+ on the right side and 2+ on the left side.     Heart sounds: Normal heart sounds. No murmur heard.   Pulmonary:     Effort: Pulmonary effort is normal. No respiratory distress.     Breath sounds: Normal breath sounds. No wheezing, rhonchi or rales.  Abdominal:     General: Abdomen is flat. Bowel sounds are normal. There is no distension.     Palpations: Abdomen is soft. There is no mass.     Tenderness: There is no abdominal tenderness. There is no guarding or rebound.     Hernia: No hernia is present.  Musculoskeletal:        General: No edema. Normal  range of motion.     Cervical back: Normal range of motion and neck supple.     Right lower leg: No edema.     Left lower leg: No edema.  Lymphadenopathy:     Cervical: No cervical adenopathy.  Skin:    General: Skin is warm and dry.     Findings: No rash.  Neurological:     General: No focal deficit present.     Mental Status: He is alert and  oriented to person, place, and time.     Comments: CN grossly intact, station and gait intact  Psychiatric:        Mood and Affect: Mood and affect and mood normal.        Behavior: Behavior normal.        Thought Content: Thought content normal.        Judgment: Judgment normal.       Results for orders placed or performed in visit on 09/12/20  PSA, Medicare  Result Value Ref Range   PSA 1.15 0.10 - 4.00 ng/ml  Uric acid  Result Value Ref Range   Uric Acid, Serum 6.1 4.0 - 7.8 mg/dL  Hemoglobin A1c  Result Value Ref Range   Hgb A1c MFr Bld 6.3 4.6 - 6.5 %  Comprehensive metabolic panel  Result Value Ref Range   Sodium 137 135 - 145 mEq/L   Potassium 4.5 3.5 - 5.1 mEq/L   Chloride 102 96 - 112 mEq/L   CO2 29 19 - 32 mEq/L   Glucose, Bld 108 (H) 70 - 99 mg/dL   BUN 25 (H) 6 - 23 mg/dL   Creatinine, Ser 1.03 0.40 - 1.50 mg/dL   Total Bilirubin 0.6 0.2 - 1.2 mg/dL   Alkaline Phosphatase 64 39 - 117 U/L   AST 24 0 - 37 U/L   ALT 32 0 - 53 U/L   Total Protein 6.8 6.0 - 8.3 g/dL   Albumin 4.3 3.5 - 5.2 g/dL   GFR 73.26 >60.00 mL/min   Calcium 9.2 8.4 - 10.5 mg/dL  Lipid panel  Result Value Ref Range   Cholesterol 140 0 - 200 mg/dL   Triglycerides 177.0 (H) 0.0 - 149.0 mg/dL   HDL 27.90 (L) >39.00 mg/dL   VLDL 35.4 0.0 - 40.0 mg/dL   LDL Cholesterol 76 0 - 99 mg/dL   Total CHOL/HDL Ratio 5    NonHDL 111.87    Assessment & Plan:  This visit occurred during the SARS-CoV-2 public health emergency.  Safety protocols were in place, including screening questions prior to the visit, additional usage of staff PPE, and extensive  cleaning of exam room while observing appropriate contact time as indicated for disinfecting solutions.   Problem List Items Addressed This Visit    Small vessel disease, cerebrovascular    Continues aspirin. Consider statin.       Prediabetes - Primary    Chronic, remains in prediabetes range - will change dx.       Osteoarthritis, multiple sites   OSA (obstructive sleep apnea)    Continues autoPAP 5-15 nightly regularly.  Has not seen Dr Constance Holster in years. Encouraged he call to schedule f/u.       Obesity, Class I, BMI 30-34.9    Congratulated on weight loss to date. Activity limited by back pain      Lumbar stenosis with neurogenic claudication    PT hasn't helped. Planning to return to ortho to review options.       Hyperlipidemia associated with type 2 diabetes mellitus (HCC)    Chronic, however not on statin. Sugars remain in prediabetes range. Reviewed diet choices to improve triglycerides.  The 10-year ASCVD risk score Mikey Bussing DC Jr., et al., 2013) is: 43.7%   Values used to calculate the score:     Age: 45 years     Sex: Male     Is Non-Hispanic African American: No     Diabetic: Yes     Tobacco smoker:  No     Systolic Blood Pressure: 518 mmHg     Is BP treated: Yes     HDL Cholesterol: 27.9 mg/dL     Total Cholesterol: 140 mg/dL       Gout    Stable period on allopurinol 300mg  daily - continue       Essential hypertension    Chronic, stable. Continue current regimen.        Other Visit Diagnoses    Need for influenza vaccination       Relevant Orders   Flu Vaccine QUAD High Dose(Fluad) (Completed)       No orders of the defined types were placed in this encounter.  Orders Placed This Encounter  Procedures  . Flu Vaccine QUAD High Dose(Fluad)    Patient instructions: If it's been several years, touch base with Dr Janeice Robinson office about follow up plan.  Flu shot today.  Contact us in a few months when ready for dermatology referral.  Good to see  you today  Return as needed or in 6 months for diabetes follow up visit   Follow up plan: Return in about 6 months (around 03/16/2021), or if symptoms worsen or fail to improve, for follow up visit.  [name], MD

## 2020-09-16 NOTE — Assessment & Plan Note (Addendum)
Chronic, however not on statin. Sugars remain in prediabetes range. Reviewed diet choices to improve triglycerides.  The 10-year ASCVD risk score Mikey Bussing DC Brooke Bonito., et al., 2013) is: 43.7%   Values used to calculate the score:     Age: 70 years     Sex: Male     Is Non-Hispanic African American: No     Diabetic: Yes     Tobacco smoker: No     Systolic Blood Pressure: 732 mmHg     Is BP treated: Yes     HDL Cholesterol: 27.9 mg/dL     Total Cholesterol: 140 mg/dL

## 2020-09-16 NOTE — Assessment & Plan Note (Signed)
Chronic, remains in prediabetes range - will change dx.

## 2020-09-16 NOTE — Assessment & Plan Note (Signed)
Congratulated on weight loss to date. Activity limited by back pain

## 2020-09-16 NOTE — Assessment & Plan Note (Signed)
PT hasn't helped. Planning to return to ortho to review options.

## 2020-09-16 NOTE — Assessment & Plan Note (Addendum)
Continues autoPAP 5-15 nightly regularly.  Has not seen Dr Constance Holster in years. Encouraged he call to schedule f/u.

## 2020-09-16 NOTE — Assessment & Plan Note (Signed)
Stable period on allopurinol 300mg  daily - continue

## 2020-09-19 DIAGNOSIS — H5211 Myopia, right eye: Secondary | ICD-10-CM | POA: Diagnosis not present

## 2020-09-24 ENCOUNTER — Encounter: Payer: Self-pay | Admitting: Internal Medicine

## 2020-10-28 DIAGNOSIS — M48062 Spinal stenosis, lumbar region with neurogenic claudication: Secondary | ICD-10-CM | POA: Diagnosis not present

## 2020-10-29 DIAGNOSIS — M25662 Stiffness of left knee, not elsewhere classified: Secondary | ICD-10-CM | POA: Diagnosis not present

## 2020-10-29 DIAGNOSIS — M25562 Pain in left knee: Secondary | ICD-10-CM | POA: Diagnosis not present

## 2020-10-29 DIAGNOSIS — Z96652 Presence of left artificial knee joint: Secondary | ICD-10-CM | POA: Diagnosis not present

## 2020-10-30 ENCOUNTER — Other Ambulatory Visit: Payer: Self-pay | Admitting: Family Medicine

## 2020-11-03 DIAGNOSIS — R262 Difficulty in walking, not elsewhere classified: Secondary | ICD-10-CM | POA: Diagnosis not present

## 2020-11-03 DIAGNOSIS — M25662 Stiffness of left knee, not elsewhere classified: Secondary | ICD-10-CM | POA: Diagnosis not present

## 2020-11-04 DIAGNOSIS — M545 Low back pain, unspecified: Secondary | ICD-10-CM | POA: Diagnosis not present

## 2020-11-06 DIAGNOSIS — M25662 Stiffness of left knee, not elsewhere classified: Secondary | ICD-10-CM | POA: Diagnosis not present

## 2020-11-06 DIAGNOSIS — R262 Difficulty in walking, not elsewhere classified: Secondary | ICD-10-CM | POA: Diagnosis not present

## 2020-11-10 DIAGNOSIS — R262 Difficulty in walking, not elsewhere classified: Secondary | ICD-10-CM | POA: Diagnosis not present

## 2020-11-10 DIAGNOSIS — M25662 Stiffness of left knee, not elsewhere classified: Secondary | ICD-10-CM | POA: Diagnosis not present

## 2020-11-10 DIAGNOSIS — M48062 Spinal stenosis, lumbar region with neurogenic claudication: Secondary | ICD-10-CM | POA: Diagnosis not present

## 2020-11-12 DIAGNOSIS — R262 Difficulty in walking, not elsewhere classified: Secondary | ICD-10-CM | POA: Diagnosis not present

## 2020-11-12 DIAGNOSIS — M25662 Stiffness of left knee, not elsewhere classified: Secondary | ICD-10-CM | POA: Diagnosis not present

## 2020-11-17 DIAGNOSIS — M25662 Stiffness of left knee, not elsewhere classified: Secondary | ICD-10-CM | POA: Diagnosis not present

## 2020-11-17 DIAGNOSIS — R262 Difficulty in walking, not elsewhere classified: Secondary | ICD-10-CM | POA: Diagnosis not present

## 2020-11-19 DIAGNOSIS — R262 Difficulty in walking, not elsewhere classified: Secondary | ICD-10-CM | POA: Diagnosis not present

## 2020-11-19 DIAGNOSIS — M25662 Stiffness of left knee, not elsewhere classified: Secondary | ICD-10-CM | POA: Diagnosis not present

## 2020-11-24 DIAGNOSIS — R262 Difficulty in walking, not elsewhere classified: Secondary | ICD-10-CM | POA: Diagnosis not present

## 2020-11-24 DIAGNOSIS — M25662 Stiffness of left knee, not elsewhere classified: Secondary | ICD-10-CM | POA: Diagnosis not present

## 2020-11-28 ENCOUNTER — Other Ambulatory Visit: Payer: Self-pay | Admitting: Family Medicine

## 2020-12-01 DIAGNOSIS — Z471 Aftercare following joint replacement surgery: Secondary | ICD-10-CM | POA: Diagnosis not present

## 2020-12-01 DIAGNOSIS — R262 Difficulty in walking, not elsewhere classified: Secondary | ICD-10-CM | POA: Diagnosis not present

## 2020-12-01 DIAGNOSIS — M25662 Stiffness of left knee, not elsewhere classified: Secondary | ICD-10-CM | POA: Diagnosis not present

## 2020-12-01 DIAGNOSIS — Z96652 Presence of left artificial knee joint: Secondary | ICD-10-CM | POA: Diagnosis not present

## 2021-01-17 DIAGNOSIS — Z6831 Body mass index (BMI) 31.0-31.9, adult: Secondary | ICD-10-CM | POA: Diagnosis not present

## 2021-01-17 DIAGNOSIS — M62830 Muscle spasm of back: Secondary | ICD-10-CM | POA: Diagnosis not present

## 2021-01-17 DIAGNOSIS — M5416 Radiculopathy, lumbar region: Secondary | ICD-10-CM | POA: Diagnosis not present

## 2021-01-17 DIAGNOSIS — M545 Low back pain, unspecified: Secondary | ICD-10-CM | POA: Diagnosis not present

## 2021-01-21 DIAGNOSIS — L819 Disorder of pigmentation, unspecified: Secondary | ICD-10-CM | POA: Diagnosis not present

## 2021-01-21 DIAGNOSIS — L570 Actinic keratosis: Secondary | ICD-10-CM | POA: Diagnosis not present

## 2021-03-12 DIAGNOSIS — Z23 Encounter for immunization: Secondary | ICD-10-CM | POA: Diagnosis not present

## 2021-03-16 ENCOUNTER — Other Ambulatory Visit: Payer: Self-pay

## 2021-03-16 ENCOUNTER — Ambulatory Visit (INDEPENDENT_AMBULATORY_CARE_PROVIDER_SITE_OTHER): Payer: Medicare Other | Admitting: Family Medicine

## 2021-03-16 ENCOUNTER — Encounter: Payer: Self-pay | Admitting: Family Medicine

## 2021-03-16 VITALS — BP 124/68 | HR 62 | Temp 97.5°F | Ht 71.5 in | Wt 232.1 lb

## 2021-03-16 DIAGNOSIS — M48062 Spinal stenosis, lumbar region with neurogenic claudication: Secondary | ICD-10-CM | POA: Diagnosis not present

## 2021-03-16 DIAGNOSIS — R7303 Prediabetes: Secondary | ICD-10-CM | POA: Diagnosis not present

## 2021-03-16 LAB — POCT GLYCOSYLATED HEMOGLOBIN (HGB A1C): Hemoglobin A1C: 6.4 % — AB (ref 4.0–5.6)

## 2021-03-16 MED ORDER — ACETAMINOPHEN ER 650 MG PO TBCR: 1300.0000 mg | EXTENDED_RELEASE_TABLET | Freq: Two times a day (BID) | ORAL | Status: DC | PRN

## 2021-03-16 NOTE — Assessment & Plan Note (Signed)
Chronic, stable period. Continue to monitor.

## 2021-03-16 NOTE — Assessment & Plan Note (Signed)
Ongoing activity limiting back pain. Told could no longer get steroid injections. Considering spine surgery. Started on low dose gabapentin by PM&R.

## 2021-03-16 NOTE — Progress Notes (Signed)
Patient ID: Ronald Husky., male    DOB: 06/02/1951, 70 y.o.   MRN: [id number]  This visit was conducted in person.  BP 124/68   Pulse 62   Temp (!) 97.5 F (36.4 C) (Temporal)   Ht 5' 11.5" (1.816 m)   Wt 232 lb 2 oz (105.3 kg)   SpO2 97%   BMI 31.92 kg/m    CC: 6 mo prediabetes f/u  Subjective:   HPI: Ronald Hockey. is a 70 y.o. male presenting on 03/16/2021 for Diabetes (Here for 6 mo f/u.  Also, pt provided copy (to keep) or recent MRI. )   Daughter with cancer - he is helping care for her and grandchildren.   He stopped aspirin due to easy bruising/bleeding.   History of diabetes, but over the past year improved glycemic control after 10 lb weight loss after knee replacement surgery.   Known spinal stenosis - walking is limited due to back pain. No longer able to get steroid shots into spine. Has been started on gabapentin '100mg'$  which he takes QHS PRN. Not regularly using meloxicam. Instead using tylenol arthritis strength.   A1c trending up - has drank more sweet tea recently - will renew efforts towards low sugar low carb diet.   Gout - no recent flares, continues allopurinol '300mg'$  daily.     Relevant past medical, surgical, family and social history reviewed and updated as indicated. Interim medical history since our last visit reviewed. Allergies and medications reviewed and updated. Outpatient Medications Prior to Visit  Medication Sig Dispense Refill   allopurinol (ZYLOPRIM) 300 MG tablet TAKE 1 TABLET BY MOUTH EVERY DAY 90 tablet 3   colchicine 0.6 MG tablet TAKE 1 TABLET (0.6 MG TOTAL) BY MOUTH 2 (TWO) TIMES DAILY AS NEEDED. 30 tablet 3   gabapentin (NEURONTIN) 100 MG capsule Take 1 capsule (100 mg total) by mouth at bedtime as needed (back pain).     lisinopril (ZESTRIL) 5 MG tablet TAKE 1 TABLET BY MOUTH EVERY DAY 90 tablet 3   Multiple Vitamins-Minerals (CENTRUM SILVER 50+MEN PO) Take by mouth daily.     Omega-3 Fatty Acids (FISH OIL)  1200 MG CAPS Take 2 capsules (2,400 mg total) by mouth daily.     acetaminophen (TYLENOL) 500 MG tablet Take 500 mg by mouth 2 (two) times daily as needed.     ASPIRIN 81 PO Take by mouth daily.     No facility-administered medications prior to visit.     Per HPI unless specifically indicated in ROS section below Review of Systems  Objective:  BP 124/68   Pulse 62   Temp (!) 97.5 F (36.4 C) (Temporal)   Ht 5' 11.5" (1.816 m)   Wt 232 lb 2 oz (105.3 kg)   SpO2 97%   BMI 31.92 kg/m   Wt Readings from Last 3 Encounters:  03/16/21 232 lb 2 oz (105.3 kg)  09/16/20 230 lb 9 oz (104.6 kg)  03/10/20 (!) 240 lb 14.4 oz (109.3 kg)      Physical Exam Vitals and nursing note reviewed.  Constitutional:      Appearance: Normal appearance. He is not ill-appearing.  Cardiovascular:     Rate and Rhythm: Normal rate and regular rhythm.     Pulses: Normal pulses.     Heart sounds: Normal heart sounds. No murmur heard. Pulmonary:     Effort: Pulmonary effort is normal. No respiratory distress.     Breath sounds: Normal breath sounds.  No wheezing, rhonchi or rales.  Musculoskeletal:     Right lower leg: No edema.     Left lower leg: No edema.  Neurological:     Mental Status: He is alert.  Psychiatric:        Mood and Affect: Mood normal.        Behavior: Behavior normal.      Results for orders placed or performed in visit on 03/16/21  POCT glycosylated hemoglobin (Hb A1C)  Result Value Ref Range   Hemoglobin A1C 6.4 (A) 4.0 - 5.6 %   HbA1c POC (<> result, manual entry)     HbA1c, POC (prediabetic range)     HbA1c, POC (controlled diabetic range)      Assessment & Plan:  This visit occurred during the SARS-CoV-2 public health emergency.  Safety protocols were in place, including screening questions prior to the visit, additional usage of staff PPE, and extensive cleaning of exam room while observing appropriate contact time as indicated for disinfecting solutions.   Problem  List Items Addressed This Visit     Prediabetes - Primary    Chronic, stable period. Continue to monitor.        Relevant Orders   POCT glycosylated hemoglobin (Hb A1C) (Completed)   Lumbar stenosis with neurogenic claudication    Ongoing activity limiting back pain. Told could no longer get steroid injections. Considering spine surgery. Started on low dose gabapentin by PM&R.        Relevant Medications   gabapentin (NEURONTIN) 100 MG capsule   acetaminophen (TYLENOL 8 HOUR) 650 MG CR tablet     Meds ordered this encounter  Medications   acetaminophen (TYLENOL 8 HOUR) 650 MG CR tablet    Sig: Take 2 tablets (1,300 mg total) by mouth 2 (two) times daily as needed for pain.    Orders Placed This Encounter  Procedures   POCT glycosylated hemoglobin (Hb A1C)    Patient Instructions  Good to see you today Work on low sugar diet, watch sweetened beverages.  Return as needed or in 6 months for wellness visit.   Follow up plan: Return in about 6 months (around 09/16/2021) for medicare wellness visit.  [name], MD

## 2021-03-16 NOTE — Patient Instructions (Signed)
Good to see you today Work on low sugar diet, watch sweetened beverages.  Return as needed or in 6 months for wellness visit.

## 2021-04-09 DIAGNOSIS — G4733 Obstructive sleep apnea (adult) (pediatric): Secondary | ICD-10-CM | POA: Diagnosis not present

## 2021-06-02 ENCOUNTER — Encounter: Payer: Self-pay | Admitting: Internal Medicine

## 2021-06-09 ENCOUNTER — Encounter: Payer: Self-pay | Admitting: Internal Medicine

## 2021-06-09 ENCOUNTER — Ambulatory Visit (INDEPENDENT_AMBULATORY_CARE_PROVIDER_SITE_OTHER): Payer: Medicare Other | Admitting: Internal Medicine

## 2021-06-09 VITALS — BP 120/68 | HR 59 | Ht 71.5 in | Wt 230.0 lb

## 2021-06-09 DIAGNOSIS — Z1211 Encounter for screening for malignant neoplasm of colon: Secondary | ICD-10-CM

## 2021-06-09 DIAGNOSIS — K219 Gastro-esophageal reflux disease without esophagitis: Secondary | ICD-10-CM

## 2021-06-09 NOTE — Progress Notes (Signed)
HISTORY OF PRESENT ILLNESS:  Ronald Bullock. is a 70 y.o. male with past medical history as listed below who presents today to this office to discuss colon cancer screening strategies.  Patient last underwent complete colonoscopy November 2011.  Examination was normal.  He also underwent colonoscopy in 1998 which was also normal.  He received a recall letter.  He was encouraged by his PCP to discuss colon cancer screening strategies with this office.  Patient's GI review of systems is remarkable for occasional heartburn.  No dysphagia or other alarm features.  GI review of systems is otherwise negative.  No bleeding or change in bowel habits.  No unexplained weight loss.  No family history of colon cancer.  His daughter struggling with breast cancer.  Review of outside blood work from January 2022 shows normal liver tests.  REVIEW OF SYSTEMS:  All non-GI ROS negative unless otherwise stated in the HPI except for arthritis  Past Medical History:  Diagnosis Date   Arthritis    L end stage knee DKD, mild R knee DJD, R hip end stage DJD   DDD (degenerative disc disease), cervical    multilevel   Gout    rare flares   HLD (hyperlipidemia)    Low HDL   Hyperglycemia    Hypertension    Lumbar spinal stenosis 11/2014   severe central canal stenosis L3/4;L4/5 bilat facet hypertrophy, annular disc bulging, grade 1 sphondylolisthesis (Dumonski) planned PT and ESI (Wang)   OSA on CPAP    Osteoarthritis    R knee and hip, s/p L TKR    Past Surgical History:  Procedure Laterality Date   cardiolyte  1997   WNL   carotid US  1997   WNL   CATARACT EXTRACTION W/PHACO Left 12/24/2014   Procedure: CATARACT EXTRACTION PHACO AND INTRAOCULAR LENS PLACEMENT ([place]);  Surgeon: Birder Robson, MD; [place] 01:16AP% 26.4Cde 20.17   COLONOSCOPY  1998   WNL   COLONOSCOPY  06/2010   WNL, internal hemorrhoids, rec rpt 10 yrs   CT HEAD LIMITED W/CM  1997   WNL   ESOPHAGOGASTRODUODENOSCOPY  1998   gastric  polyps, benign   JOINT REPLACEMENT  1968   Left Knee, cartilage removed   KNEE ARTHROSCOPY  1968   cartilage removal   LUMBAR EPIDURAL INJECTION  02/2017   L3-4 interlaminar injection Mina Marble)   MRI lumbar  2000   mild bulge L3/4, mild foraminal narrowing L4/5, L5/S2, small disk herniation   MRI lumbar  11/2014   severe L3/4, L4/5 central canal stenosis (Dumonski)   SHOULDER SURGERY Right 05/2019   [facility]   TOTAL HIP ARTHROPLASTY Right 03/27/2013   TOTAL HIP ARTHROPLASTY ANTERIOR APPROACH;  Surgeon: Hessie Dibble, MD   TOTAL KNEE ARTHROPLASTY Left 09/26/2012   TOTAL KNEE ARTHROPLASTY;  Surgeon: Hessie Dibble, MD    Social History Ronald Bullock.  reports that he has never smoked. He has never used smokeless tobacco. He reports that he does not drink alcohol and does not use drugs.  family history includes Asthma in his mother; Cancer in his mother; Diabetes in his father; Obesity in his sister; Stroke (age of onset: 33) in his father.  Allergies  Allergen Reactions   Omeprazole     Vomiting/diarrhea on BID dosing       PHYSICAL EXAMINATION: Vital signs: BP 120/68   Pulse (!) 59   Ht 5' 11.5" (1.816 m)   Wt 230 lb (104.3 kg)   BMI 31.63  kg/m   Constitutional: generally well-appearing, no acute distress Psychiatric: alert and oriented x3, cooperative Eyes: extraocular movements intact, anicteric, conjunctiva pink Mouth: oral pharynx moist, no lesions Neck: supple no lymphadenopathy Cardiovascular: heart regular rate and rhythm, no murmur Lungs: clear to auscultation bilaterally Abdomen: soft, nontender, nondistended, no obvious ascites, no peritoneal signs, normal bowel sounds, no organomegaly Rectal: Omitted Extremities: no clubbing, cyanosis, or lower extremity edema bilaterally Skin: no lesions on visible extremities Neuro: No focal deficits.  Cranial nerves intact  ASSESSMENT:  1.  GERD.  Infrequent.  No alarm features. 2.  Colon cancer screening.   Normal colonoscopy 1998 and 2011.  The patient is average risk.  No signs or symptoms to suggest lower GI pathology. 3.  General medical problems.  Stable  PLAN:  1.  Reflux precautions 2.  We discussed optical colonoscopy, FIT testing, and Cologuard testing.The nature of the various strategies, as well as the risks and benefits were carefully and thoroughly reviewed with the patient. Ample time for discussion and questions allowed. The patient understood, was satisfied.  He chose Cologuard as his screening strategy.  He understands that a positive test result would elicit the need for colonoscopy.  He was instructed to contact the office should he submit his study and not hear back from this office in a reasonable (month or so) amount of time.  He agrees. 3.  Resume general medical care with PCP

## 2021-06-09 NOTE — Patient Instructions (Signed)
Your provider has ordered Cologuard testing as an option for colon cancer screening. This is performed by [facility] and may be out of network with your insurance. PRIOR to completing the test, it is YOUR responsibility to contact your insurance about covered benefits for this test. Your out of pocket expense could be anywhere from $0.00 to $649.00.   When you call to check coverage with your insurer, please provide the following information:   -The ONLY provider of Cologuard is [facility]  - CPT code for Cologuard is 81528.  -[facility] NPI # [id number]  -[facility] Tax ID # [id number]   We have already sent your demographic and insurance information to [facility] (phone number [phone]) and they should contact you within the next week regarding your test. If you have not heard from them within the next week, please call our office at [phone].    

## 2021-06-16 DIAGNOSIS — Z1211 Encounter for screening for malignant neoplasm of colon: Secondary | ICD-10-CM | POA: Diagnosis not present

## 2021-06-22 LAB — COLOGUARD: COLOGUARD: POSITIVE — AB

## 2021-06-24 ENCOUNTER — Telehealth: Payer: Self-pay

## 2021-06-24 ENCOUNTER — Other Ambulatory Visit: Payer: Self-pay

## 2021-06-24 DIAGNOSIS — R195 Other fecal abnormalities: Secondary | ICD-10-CM

## 2021-06-24 MED ORDER — NA SULFATE-K SULFATE-MG SULF 17.5-3.13-1.6 GM/177ML PO SOLN: ORAL | Status: DC

## 2021-06-24 NOTE — Telephone Encounter (Signed)
Pt called back and states he cannot keep the appt on 12/19, his wife is having hip replacement surgery and that is her follow-up appt. Pt is going to check with his wife and will call back to reschedule the colon.

## 2021-06-29 ENCOUNTER — Telehealth: Payer: Self-pay | Admitting: Internal Medicine

## 2021-06-29 NOTE — Telephone Encounter (Signed)
Patient rescheduled colonoscopy for 12/21 at 3:00 p.m.  Will you please print out his instructions and leave them at the front desk for him to pick up?  Also, if you will, please call him to let him know they are ready for him to pick up.  His phone number is [phone].  Thank you.

## 2021-07-02 NOTE — Telephone Encounter (Signed)
Spoke to patient and told him I would put his new instructions up front to be picked up.  Patient agreed

## 2021-07-16 HISTORY — PX: COLONOSCOPY: SHX174

## 2021-07-29 ENCOUNTER — Other Ambulatory Visit: Payer: Self-pay

## 2021-07-29 ENCOUNTER — Telehealth: Payer: Self-pay | Admitting: Internal Medicine

## 2021-07-29 MED ORDER — NA SULFATE-K SULFATE-MG SULF 17.5-3.13-1.6 GM/177ML PO SOLN: ORAL | 0 refills | Status: DC

## 2021-07-29 MED ORDER — NA SULFATE-K SULFATE-MG SULF 17.5-3.13-1.6 GM/177ML PO SOLN: ORAL | Status: DC

## 2021-07-29 NOTE — Telephone Encounter (Signed)
Patient called and stated that pharmacy has not received his prep medication for his procedure next week. Please advise.

## 2021-07-29 NOTE — Telephone Encounter (Signed)
Prep sent to the pharmacy, pt aware.

## 2021-08-03 ENCOUNTER — Encounter: Payer: PRIVATE HEALTH INSURANCE | Admitting: Internal Medicine

## 2021-08-05 ENCOUNTER — Encounter: Payer: Self-pay | Admitting: Internal Medicine

## 2021-08-05 ENCOUNTER — Other Ambulatory Visit: Payer: Self-pay

## 2021-08-05 ENCOUNTER — Ambulatory Visit (AMBULATORY_SURGERY_CENTER): Payer: Medicare Other | Admitting: Internal Medicine

## 2021-08-05 VITALS — BP 123/66 | HR 63 | Temp 98.9°F | Resp 12 | Ht 71.5 in | Wt 230.0 lb

## 2021-08-05 DIAGNOSIS — D122 Benign neoplasm of ascending colon: Secondary | ICD-10-CM

## 2021-08-05 DIAGNOSIS — R195 Other fecal abnormalities: Secondary | ICD-10-CM

## 2021-08-05 DIAGNOSIS — D125 Benign neoplasm of sigmoid colon: Secondary | ICD-10-CM | POA: Diagnosis not present

## 2021-08-05 DIAGNOSIS — D124 Benign neoplasm of descending colon: Secondary | ICD-10-CM

## 2021-08-05 DIAGNOSIS — K635 Polyp of colon: Secondary | ICD-10-CM | POA: Diagnosis not present

## 2021-08-05 MED ORDER — SODIUM CHLORIDE 0.9 % IV SOLN: 500.0000 mL | Freq: Once | INTRAVENOUS | Status: DC

## 2021-08-05 NOTE — Progress Notes (Signed)
VS taken by C.W. 

## 2021-08-05 NOTE — Progress Notes (Signed)
Called to room to assist during endoscopic procedure.  Patient ID and intended procedure confirmed with present staff. Received instructions for my participation in the procedure from the performing physician.  

## 2021-08-05 NOTE — Op Note (Signed)
[facility] Patient Name: Ronald Bullock Procedure Date: 08/05/2021 2:53 PM MRN: [id number] Endoscopist: Docia Chuck. [name] , MD Age: 70 Referring MD:  Date of Birth: July 24, 1951 Gender: Male Account #: [id number] Procedure:                Colonoscopy with cold snare polypectomy x 4 Indications:              Positive Cologuard test. Previous colonoscopy                            examinations 1998 and 2011 were negative for                            neoplasia Medicines:                Monitored Anesthesia Care Procedure:                Pre-Anesthesia Assessment:                           - Prior to the procedure, a History and Physical                            was performed, and patient medications and                            allergies were reviewed. The patient's tolerance of                            previous anesthesia was also reviewed. The risks                            and benefits of the procedure and the sedation                            options and risks were discussed with the patient.                            All questions were answered, and informed consent                            was obtained. Prior Anticoagulants: The patient has                            taken no previous anticoagulant or antiplatelet                            agents. ASA Grade Assessment: II - A patient with                            mild systemic disease. After reviewing the risks                            and benefits, the patient was deemed in  satisfactory condition to undergo the procedure.                           After obtaining informed consent, the colonoscope                            was passed under direct vision. Throughout the                            procedure, the patient's blood pressure, pulse, and                            oxygen saturations were monitored continuously. The                            Olympus CF-HQ190L  [id number]) Colonoscope was                            introduced through the anus and advanced to the the                            cecum, identified by appendiceal orifice and                            ileocecal valve. The ileocecal valve, appendiceal                            orifice, and rectum were photographed. The quality                            of the bowel preparation was excellent. The                            colonoscopy was performed without difficulty. The                            patient tolerated the procedure well. The bowel                            preparation used was SUPREP via split dose                            instruction. Scope In: 3:08:05 PM Scope Out: 3:26:32 PM Scope Withdrawal Time: 0 hours 15 minutes 30 seconds  Total Procedure Duration: 0 hours 18 minutes 27 seconds  Findings:                 Four polyps were found in the sigmoid colon,                            descending colon and ascending colon. The polyps                            were 2 to 3 mm in size. These polyps were removed  with a cold snare. Resection and retrieval were                            complete.                           The exam was otherwise without abnormality on                            direct and retroflexion views. Internal hemorrhoids                            present Complications:            No immediate complications. Estimated blood loss:                            None. Estimated Blood Loss:     Estimated blood loss: none. Impression:               - Four 2 to 3 mm polyps in the sigmoid colon, in                            the descending colon and in the ascending colon,                            removed with a cold snare. Resected and retrieved.                           - The examination was otherwise normal on direct                            and retroflexion views. Internal hemorrhoids. Recommendation:           - Repeat  colonoscopy in 5 years for surveillance.                           - Patient has a contact number available for                            emergencies. The signs and symptoms of potential                            delayed complications were discussed with the                            patient. Return to normal activities tomorrow.                            Written discharge instructions were provided to the                            patient.                           - Resume previous diet.                           -  Continue present medications.                           - Await pathology results. Docia Chuck. [name], MD 08/05/2021 3:32:32 PM This report has been signed electronically.

## 2021-08-05 NOTE — Patient Instructions (Signed)

## 2021-08-05 NOTE — Progress Notes (Signed)
Sedate, gd SR, tolerated procedure well, VSS, report to RN 

## 2021-08-05 NOTE — Progress Notes (Signed)
HISTORY OF PRESENT ILLNESS:   Ronald Sweetser. is a 70 y.o. male with past medical history as listed below who presents today to this office to discuss colon cancer screening strategies.  Patient last underwent complete colonoscopy November 2011.  Examination was normal.  He also underwent colonoscopy in 1998 which was also normal.  He received a recall letter.  He was encouraged by his PCP to discuss colon cancer screening strategies with this office.  Patient's GI review of systems is remarkable for occasional heartburn.  No dysphagia or other alarm features.  GI review of systems is otherwise negative.  No bleeding or change in bowel habits.  No unexplained weight loss.  No family history of colon cancer.  His daughter struggling with breast cancer.  Review of outside blood work from January 2022 shows normal liver tests.   REVIEW OF SYSTEMS:   All non-GI ROS negative unless otherwise stated in the HPI except for arthritis       Past Medical History:  Diagnosis Date   Arthritis      L end stage knee DKD, mild R knee DJD, R hip end stage DJD   DDD (degenerative disc disease), cervical      multilevel   Gout      rare flares   HLD (hyperlipidemia)      Low HDL   Hyperglycemia     Hypertension     Lumbar spinal stenosis 11/2014    severe central canal stenosis L3/4;L4/5 bilat facet hypertrophy, annular disc bulging, grade 1 sphondylolisthesis (Dumonski) planned PT and ESI ([facility])   OSA on CPAP     Osteoarthritis      R knee and hip, s/p L TKR           Past Surgical History:  Procedure Laterality Date   cardiolyte   1997    WNL   carotid US   1997    WNL   CATARACT EXTRACTION W/PHACO Left 12/24/2014    Procedure: CATARACT EXTRACTION PHACO AND INTRAOCULAR LENS PLACEMENT ([place]);  Surgeon: Birder Robson, MD; [place] 01:16AP% 26.4Cde 20.17   COLONOSCOPY   1998    WNL   COLONOSCOPY   06/2010    WNL, internal hemorrhoids, rec rpt 10 yrs   CT HEAD LIMITED W/CM   1997    WNL    ESOPHAGOGASTRODUODENOSCOPY   1998    gastric polyps, benign   JOINT REPLACEMENT   1968    Left Knee, cartilage removed   KNEE ARTHROSCOPY   1968    cartilage removal   LUMBAR EPIDURAL INJECTION   02/2017    L3-4 interlaminar injection Mina Marble)   MRI lumbar   2000    mild bulge L3/4, mild foraminal narrowing L4/5, L5/S2, small disk herniation   MRI lumbar   11/2014    severe L3/4, L4/5 central canal stenosis (Dumonski)   SHOULDER SURGERY Right 05/2019    [facility]   TOTAL HIP ARTHROPLASTY Right 03/27/2013    TOTAL HIP ARTHROPLASTY ANTERIOR APPROACH;  Surgeon: Hessie Dibble, MD   TOTAL KNEE ARTHROPLASTY Left 09/26/2012    TOTAL KNEE ARTHROPLASTY;  Surgeon: Hessie Dibble, MD      Social History [name]  reports that he has never smoked. He has never used smokeless tobacco. He reports that he does not drink alcohol and does not use drugs.   family history includes Asthma in his mother; Cancer in his mother; Diabetes in his father; Obesity in his sister; Stroke (  age of onset: 36) in his father.        Allergies  Allergen Reactions   Omeprazole        Vomiting/diarrhea on BID dosing          PHYSICAL EXAMINATION: Vital signs: BP 120/68    Pulse (!) 59    Ht 5' 11.5" (1.816 m)    Wt 230 lb (104.3 kg)    BMI 31.63 kg/m   Constitutional: generally well-appearing, no acute distress Psychiatric: alert and oriented x3, cooperative Eyes: extraocular movements intact, anicteric, conjunctiva pink Mouth: oral pharynx moist, no lesions Neck: supple no lymphadenopathy Cardiovascular: heart regular rate and rhythm, no murmur Lungs: clear to auscultation bilaterally Abdomen: soft, nontender, nondistended, no obvious ascites, no peritoneal signs, normal bowel sounds, no organomegaly Rectal: Omitted Extremities: no clubbing, cyanosis, or lower extremity edema bilaterally Skin: no lesions on visible extremities Neuro: No focal deficits.  Cranial nerves intact    ASSESSMENT:   1.  GERD.  Infrequent.  No alarm features. 2.  Colon cancer screening.  Normal colonoscopy 1998 and 2011.  The patient is average risk.  No signs or symptoms to suggest lower GI pathology. 3.  General medical problems.  Stable   PLAN:   1.  Reflux precautions 2.  We discussed optical colonoscopy, FIT testing, and Cologuard testing.The nature of the various strategies, as well as the risks and benefits were carefully and thoroughly reviewed with the patient. Ample time for discussion and questions allowed. The patient understood, was satisfied.  He chose Cologuard as his screening strategy.  He understands that a positive test result would elicit the need for colonoscopy.  He was instructed to contact the office should he submit his study and not hear back from this office in a reasonable (month or so) amount of time.  He agrees.  The patient was evaluated in the office June 09, 2021 as outlined above.  Cologuard testing returned positive.  He is now for colonoscopy.  No interval changes in the history or physical examination

## 2021-08-06 ENCOUNTER — Encounter: Payer: Self-pay | Admitting: Family Medicine

## 2021-08-07 ENCOUNTER — Telehealth: Payer: Self-pay

## 2021-08-07 NOTE — Telephone Encounter (Signed)
°  Follow up Call-  Call back number 08/05/2021  Post procedure Call Back phone  # [phone]  Permission to leave phone message Yes  Some recent data might be hidden     Patient questions:  Do you have a fever, pain , or abdominal swelling? No. Pain Score  0 *  Have you tolerated food without any problems? Yes.    Have you been able to return to your normal activities? Yes.    Do you have any questions about your discharge instructions: Diet   No. Medications  No. Follow up visit  No.  Do you have questions or concerns about your Care? No.  Actions: * If pain score is 4 or above: No action needed, pain <4.  Have you developed a fever since your procedure? no  2.   Have you had an respiratory symptoms (SOB or cough) since your procedure? no  3.   Have you tested positive for COVID 19 since your procedure no  4.   Have you had any family members/close contacts diagnosed with the COVID 19 since your procedure?  no   If yes to any of these questions please route to Joylene John, RN and Joella Prince, RN

## 2021-08-18 ENCOUNTER — Encounter: Payer: Self-pay | Admitting: Internal Medicine

## 2021-08-30 ENCOUNTER — Encounter: Payer: Self-pay | Admitting: Family Medicine

## 2021-09-03 ENCOUNTER — Other Ambulatory Visit: Payer: Self-pay | Admitting: Family Medicine

## 2021-09-03 DIAGNOSIS — Z125 Encounter for screening for malignant neoplasm of prostate: Secondary | ICD-10-CM

## 2021-09-03 DIAGNOSIS — E785 Hyperlipidemia, unspecified: Secondary | ICD-10-CM

## 2021-09-03 DIAGNOSIS — M1A00X0 Idiopathic chronic gout, unspecified site, without tophus (tophi): Secondary | ICD-10-CM

## 2021-09-03 DIAGNOSIS — R7303 Prediabetes: Secondary | ICD-10-CM

## 2021-09-09 ENCOUNTER — Other Ambulatory Visit (INDEPENDENT_AMBULATORY_CARE_PROVIDER_SITE_OTHER): Payer: Medicare Other

## 2021-09-09 ENCOUNTER — Other Ambulatory Visit: Payer: Self-pay

## 2021-09-09 DIAGNOSIS — Z125 Encounter for screening for malignant neoplasm of prostate: Secondary | ICD-10-CM | POA: Diagnosis not present

## 2021-09-09 DIAGNOSIS — R7303 Prediabetes: Secondary | ICD-10-CM | POA: Diagnosis not present

## 2021-09-09 DIAGNOSIS — E785 Hyperlipidemia, unspecified: Secondary | ICD-10-CM | POA: Diagnosis not present

## 2021-09-09 DIAGNOSIS — M1A00X0 Idiopathic chronic gout, unspecified site, without tophus (tophi): Secondary | ICD-10-CM

## 2021-09-09 LAB — COMPREHENSIVE METABOLIC PANEL
ALT: 32 U/L (ref 0–53)
AST: 26 U/L (ref 0–37)
Albumin: 4.2 g/dL (ref 3.5–5.2)
Alkaline Phosphatase: 64 U/L (ref 39–117)
BUN: 18 mg/dL (ref 6–23)
CO2: 26 mEq/L (ref 19–32)
Calcium: 9.3 mg/dL (ref 8.4–10.5)
Chloride: 104 mEq/L (ref 96–112)
Creatinine, Ser: 0.92 mg/dL (ref 0.40–1.50)
GFR: 83.31 mL/min (ref >60.00)
Glucose, Bld: 116 mg/dL — ABNORMAL HIGH (ref 70–99)
Potassium: 4.9 mEq/L (ref 3.5–5.1)
Sodium: 138 mEq/L (ref 135–145)
Total Bilirubin: 0.6 mg/dL (ref 0.2–1.2)
Total Protein: 6.8 g/dL (ref 6.0–8.3)

## 2021-09-09 LAB — LIPID PANEL
Cholesterol: 151 mg/dL (ref 0–200)
HDL: 28.5 mg/dL — ABNORMAL LOW (ref >39.00)
LDL Cholesterol: 98 mg/dL (ref 0–99)
NonHDL: 122.51
Total CHOL/HDL Ratio: 5
Triglycerides: 123 mg/dL (ref 0.0–149.0)
VLDL: 24.6 mg/dL (ref 0.0–40.0)

## 2021-09-09 LAB — CBC WITH DIFFERENTIAL/PLATELET
Basophils Absolute: 0 10*3/uL (ref 0.0–0.1)
Basophils Relative: 0.6 % (ref 0.0–3.0)
Eosinophils Absolute: 0.2 10*3/uL (ref 0.0–0.7)
Eosinophils Relative: 2.8 % (ref 0.0–5.0)
HCT: 40.2 % (ref 39.0–52.0)
Hemoglobin: 13.2 g/dL (ref 13.0–17.0)
Lymphocytes Relative: 30.3 % (ref 12.0–46.0)
Lymphs Abs: 2.1 10*3/uL (ref 0.7–4.0)
MCHC: 32.7 g/dL (ref 30.0–36.0)
MCV: 93.4 fl (ref 78.0–100.0)
Monocytes Absolute: 0.8 10*3/uL (ref 0.1–1.0)
Monocytes Relative: 11.4 % (ref 3.0–12.0)
Neutro Abs: 3.8 10*3/uL (ref 1.4–7.7)
Neutrophils Relative %: 54.9 % (ref 43.0–77.0)
Platelets: 185 10*3/uL (ref 150.0–400.0)
RBC: 4.31 Mil/uL (ref 4.22–5.81)
RDW: 15.5 % (ref 11.5–15.5)
WBC: 6.9 10*3/uL (ref 4.0–10.5)

## 2021-09-09 LAB — HEMOGLOBIN A1C: Hgb A1c MFr Bld: 6.1 % (ref 4.6–6.5)

## 2021-09-09 LAB — URIC ACID: Uric Acid, Serum: 6.3 mg/dL (ref 4.0–7.8)

## 2021-09-09 LAB — PSA, MEDICARE: PSA: 1.2 ng/ml (ref 0.10–4.00)

## 2021-09-11 NOTE — Progress Notes (Signed)
Subjective:   Ronald Bullock. is a 71 y.o. male who presents for Medicare Annual/Subsequent preventive examination.  I connected with Alisia Ferrari today by telephone and verified that I am speaking with the correct person using two identifiers. Location patient: home Location provider: work Persons participating in the virtual visit: patient, Marine scientist.    I discussed the limitations, risks, security and privacy concerns of performing an evaluation and management service by telephone and the availability of in person appointments. I also discussed with the patient that there may be a patient responsible charge related to this service. The patient expressed understanding and verbally consented to this telephonic visit.    Interactive audio and video telecommunications were attempted between this provider and patient, however failed, due to patient having technical difficulties OR patient did not have access to video capability.  We continued and completed visit with audio only.  Some vital signs may be absent or patient reported.   Time Spent with patient on telephone encounter: 25 minutes  Review of Systems     Cardiac Risk Factors include: advanced age (>67men, >46 women);hypertension;dyslipidemia     Objective:    Today's Vitals   09/14/21 0903  Weight: 210 lb (95.3 kg)  Height: 5\' 11"  (1.803 m)   Body mass index is 29.29 kg/m.  Advanced Directives 09/14/2021 09/12/2020 09/05/2019 08/30/2018 08/25/2017 08/04/2016 12/24/2014  Does Patient Have a Medical Advance Directive? Yes Yes Yes Yes No No No  Type of Paramedic of [place];Living will [place];Living will [place];Living will [place] - - -  Does patient want to make changes to medical advance directive? Yes (MAU/Ambulatory/Procedural Areas - Information given) - - - - - -  Copy of [place] in Chart? Yes - validated  most recent copy scanned in chart (See row information) Yes - validated most recent copy scanned in chart (See row information) Yes - validated most recent copy scanned in chart (See row information) No - copy requested - - -  Would patient like information on creating a medical advance directive? - - - - No - Patient declined - -  Pre-existing out of facility DNR order (yellow form or pink MOST form) - - - - - - -    Current Medications (verified) Outpatient Encounter Medications as of 09/14/2021  Medication Sig   acetaminophen (TYLENOL 8 HOUR) 650 MG CR tablet Take 2 tablets (1,300 mg total) by mouth 2 (two) times daily as needed for pain.   allopurinol (ZYLOPRIM) 300 MG tablet TAKE 1 TABLET BY MOUTH EVERY DAY   gabapentin (NEURONTIN) 300 MG capsule Take 300 mg by mouth at bedtime.   lisinopril (ZESTRIL) 5 MG tablet TAKE 1 TABLET BY MOUTH EVERY DAY   Multiple Vitamins-Minerals (CENTRUM SILVER 50+MEN PO) Take by mouth daily.   Omega-3 Fatty Acids (FISH OIL) 1200 MG CAPS Take 2 capsules (2,400 mg total) by mouth daily.   Respiratory Therapy Supplies (CARETOUCH CPAP & BIPAP HOSE) MISC by Does not apply route.   No facility-administered encounter medications on file as of 09/14/2021.    Allergies (verified) Omeprazole   History: Past Medical History:  Diagnosis Date   Arthritis    L end stage knee DKD, mild R knee DJD, R hip end stage DJD   DDD (degenerative disc disease), cervical    multilevel   Gout    rare flares   HLD (hyperlipidemia)    Low HDL  Hyperglycemia    Hypertension    Lumbar spinal stenosis 11/2014   severe central canal stenosis L3/4;L4/5 bilat facet hypertrophy, annular disc bulging, grade 1 sphondylolisthesis (Dumonski) planned PT and ESI ([facility])   OSA on CPAP    Osteoarthritis    R knee and hip, s/p L TKR   Past Surgical History:  Procedure Laterality Date   cardiolyte  1997   WNL   carotid US  1997   WNL   CATARACT EXTRACTION W/PHACO Left 12/24/2014    Procedure: CATARACT EXTRACTION PHACO AND INTRAOCULAR LENS PLACEMENT ([facility]);  Surgeon: Birder Robson, MD; [place] 01:16AP% 26.4Cde 20.17   COLONOSCOPY  1998   WNL   COLONOSCOPY  06/2010   WNL, internal hemorrhoids, rec rpt 10 yrs   COLONOSCOPY  07/2021   TAx3, HPx1, int hem, rpt 5 yrs [name])   CT HEAD LIMITED W/CM  1997   WNL   ESOPHAGOGASTRODUODENOSCOPY  1998   gastric polyps, benign   JOINT REPLACEMENT  1968   Left Knee, cartilage removed   KNEE ARTHROSCOPY  1968   cartilage removal   LUMBAR EPIDURAL INJECTION  02/2017   L3-4 interlaminar injection Mina [facility]ble)   MRI lumbar  2000   mild bulge L3/4, mild foraminal narrowing L4/5, L5/S2, small disk herniation   MRI lumbar  11/2014   severe L3/4, L4/5 central canal stenosis (Dumonski)   SHOULDER SURGERY Right 05/2019   [facility]   TOTAL HIP ARTHROPLASTY Right 03/27/2013   TOTAL HIP ARTHROPLASTY ANTERIOR APPROACH;  Surgeon: Hessie Dibble, MD   TOTAL KNEE ARTHROPLASTY Left 09/26/2012   TOTAL KNEE ARTHROPLASTY;  Surgeon: Hessie Dibble, MD   Family History  Problem Relation Age of Onset   Asthma Mother    Cancer Mother        Vaginal; radiation dz of bowel   Stroke Father 68   Diabetes Father    Obesity Sister    Esophageal cancer Neg Hx    Stomach cancer Neg Hx    Rectal cancer Neg Hx    Colon cancer Neg Hx    Social History   Socioeconomic History   Marital status: Married    Spouse name: Not on file   Number of children: 4   Years of education: college   Highest education level: Not on file  Occupational History   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   Two 32-ounce glasses of tea.   Social Determinants of Health   Financial Resource Strain: Low Risk    Difficulty of Paying Living Expenses: Not hard at all  Food Insecurity: No Food Insecurity   Worried About Charity fundraiser in the Last Year: Never true   [place] in the Last Year: Never true  Transportation Needs: No Transportation Needs   Lack of Transportation (Medical): No   Lack of Transportation (Non-Medical): No  Physical Activity: Insufficiently Active   Days of Exercise per Week: 2 days   Minutes  of Exercise per Session: 30 min  Stress: No Stress Concern Present   Feeling of Stress : Only a little  Social Connections: Moderately Integrated   Frequency of Communication with Friends and Family: More than three times a week   Frequency of Social Gatherings with Friends and Family: More than three times a week   Attends Religious Services: 1 to 4 times per year   Active Member of [employer] or Organizations: No   Attends Music therapist: Never   Marital Status: Married    Tobacco Counseling Counseling given: Not Answered   Clinical Intake:  Pre-visit preparation completed: Yes  Pain : No/denies pain     BMI - recorded: 29.29 Nutritional Status: BMI 25 -29 Overweight Nutritional Risks: None Diabetes: No  How often do you need to have someone help you when you read instructions, pamphlets, or other written materials from your doctor or pharmacy?: 1 - Never  Diabetic? No  Interpreter Needed?: No  Information entered by :: Orrin Brigham LPN   Activities of Daily Living In your present state of health, do you have any difficulty performing the following activities: 09/14/2021  Hearing? N  Vision? N  Difficulty concentrating or making decisions? N  Walking or climbing stairs? Y  Dressing or bathing? N  Doing errands, shopping? N  Preparing Food and  eating ? N  Using the Toilet? N  In the past six months, have you accidently leaked urine? N  Do you have problems with loss of bowel control? N  Managing your Medications? N  Managing your Finances? N  Housekeeping or managing your Housekeeping? N  Some recent data might be hidden    Patient Care Team: [name], MD as PCP - General Arelia Sneddon, OD as Consulting Physician (Optometry) Melrose Nakayama, MD as Consulting Physician (Orthopedic Surgery) Phylliss Bob, MD as Consulting Physician (Orthopedic Surgery) Normajean Glasgow, MD as Attending Physician (Physical Medicine and Rehabilitation) Eula Listen, DDS as Referring Physician (Dentistry)  Indicate any recent Medical Services you may have received from other than [facility] providers in the past year (date may be approximate).     Assessment:   This is a routine wellness examination for [place].  Hearing/Vision screen Hearing Screening - Comments:: No issues  Vision Screening - Comments:: Last exam 09/2020, Dr. Freda Munro, wears 1 contact in right eye  Dietary issues and exercise activities discussed: Current Exercise Habits: Home exercise routine, Type of exercise: stretching;Other - see comments (stationary bike), Time (Minutes): 30, Frequency (Times/Week): 2, Weekly Exercise (Minutes/Week): 60, Intensity: Mild   Goals Addressed             This Visit's Progress    Patient Stated       Would like to maintain current routine       Depression Screen PHQ 2/9 Scores 09/14/2021 09/12/2020 09/05/2019 08/30/2018 08/25/2017 08/04/2016 06/24/2016  PHQ - 2 Score 0 0 0 0 0 0 0  PHQ- 9 Score - 0 0 0 0 - -    Fall Risk Fall Risk  09/14/2021 09/12/2020 09/05/2019 08/30/2018 08/25/2017  Falls in the past year? 0 0 0 0 No  Comment - - - - -  Number falls in past yr: 0 0 0 - -  Injury with Fall? 0 0 0 - -  Risk for fall due to : No Fall Risks Medication side effect Medication side effect - -  Follow up Falls prevention discussed  Falls evaluation completed;Falls prevention discussed Falls evaluation completed;Falls prevention discussed - -  FALL RISK PREVENTION PERTAINING TO THE HOME:  Any stairs in or around the home? Yes  If so, are there any without handrails? No  Home free of loose throw rugs in walkways, pet beds, electrical cords, etc? Yes  Adequate lighting in your home to reduce risk of falls? Yes   ASSISTIVE DEVICES UTILIZED TO PREVENT FALLS:  Life alert? No  Use of a cane, walker or w/c? No  Grab bars in the bathroom? No  Shower chair or bench in shower? Yes  Elevated toilet seat or a handicapped toilet? Yes   TIMED UP AND GO:  Was the test performed? No .    Cognitive Function: Normal cognitive status assessed by this Nurse Health Advisor. No abnormalities found.   MMSE - Mini Mental State Exam 09/12/2020 09/05/2019 08/30/2018 08/25/2017 08/04/2016  Orientation to time 5 5 5 5 5   Orientation to Place 5 5 5 5 5   Registration 3 3 3 3 3   Attention/ Calculation 5 5 0 0 0  Recall 3 3 3 2 3   Recall-comments - - - unable to recall 1 of 3 words -  Language- name 2 objects - - 0 0 0  Language- repeat 1 1 1 1 1   Language- follow 3 step command - - 3 3 3   Language- read & follow direction - - 0 0 0  Write a sentence - - 0 0 0  Copy design - - 0 0 0  Total score - - 20 19 20         Immunizations Immunization History  Administered Date(s) Administered   Fluad Quad(high Dose 65+) 09/16/2020   Influenza, High Dose Seasonal PF 06/16/2018, 08/03/2019   PFIZER(Purple Top)SARS-COV-2 Vaccination 09/27/2019, 10/18/2019, 06/13/2020   Pneumococcal Conjugate-13 02/05/2020   Td 07/16/2001, 11/18/2008   Tdap 03/03/2014   Zoster Recombinat (Shingrix) 03/10/2020, 07/01/2020    TDAP status: Up to date  Flu Vaccine status: Up to date  Pneumococcal vaccine status: Due, Education has been provided regarding the importance of this vaccine. Advised may receive this vaccine at local pharmacy or Health Dept.  Aware to provide a copy of the vaccination record if obtained from local pharmacy or Health Dept. Verbalized acceptance and understanding.  Covid-19 vaccine status: Information provided on how to obtain vaccines.   Qualifies for Shingles Vaccine? Yes   Zostavax completed No   Shingrix Completed?: Yes  Screening Tests Health Maintenance  Topic Date Due   OPHTHALMOLOGY EXAM  10/15/2019   COVID-19 Vaccine (4 - Booster for Pfizer series) 08/08/2020   Pneumonia Vaccine 59+ Years old (2 - PPSV23 if available, else PCV20) 02/04/2021   FOOT EXAM  03/10/2021   INFLUENZA VACCINE  03/16/2021   HEMOGLOBIN A1C  03/09/2022   TETANUS/TDAP  03/03/2024   COLONOSCOPY (Pts 45-29yrs Insurance coverage will need to be confirmed)  08/05/2026   Hepatitis C Screening  Completed   Zoster Vaccines- Shingrix  Completed   HPV VACCINES  Aged Out    Health Maintenance  Health Maintenance Due  Topic Date Due   OPHTHALMOLOGY EXAM  10/15/2019   COVID-19 Vaccine (4 - Booster for Pfizer series) 08/08/2020   Pneumonia Vaccine 47+ Years old (2 - PPSV23 if available, else PCV20) 02/04/2021   FOOT EXAM  03/10/2021   INFLUENZA VACCINE  03/16/2021    Colorectal cancer screening: Type of screening: Colonoscopy. Completed 08/05/21. Repeat every 5 years  Lung Cancer Screening: (Low Dose CT Chest recommended if Age 15-80 years, 30 pack-year currently smoking OR have quit  w/in 15years.) does not qualify.    Additional Screening:  Hepatitis C Screening: does qualify; Completed 06/16/15  Vision Screening: Recommended annual ophthalmology exams for early detection of glaucoma and other disorders of the eye. Is the patient up to date with their annual eye exam?  Yes  Who is the provider or what is the name of the office in which the patient attends annual eye exams? Dr. Corky Sing   Dental Screening: Recommended annual dental exams for proper oral hygiene  Community Resource Referral / Chronic Care Management: CRR  required this visit?  No   CCM required this visit?  No      Plan:     I have personally reviewed and noted the following in the patients chart:   Medical and social history Use of alcohol, tobacco or illicit drugs  Current medications and supplements including opioid prescriptions. Patient is not currently taking opioid prescriptions. Functional ability and status Nutritional status Physical activity Advanced directives List of other physicians Hospitalizations, surgeries, and ER visits in previous 12 months Vitals Screenings to include cognitive, depression, and falls Referrals and appointments  In addition, I have reviewed and discussed with patient certain preventive protocols, quality metrics, and best practice recommendations. A written personalized care plan for preventive services as well as general preventive health recommendations were provided to patient.   Due to this being a telephonic visit, the after visit summary with patients personalized plan was offered to patient via mail or my-chart.  Patient preferred to pick up at office at next visit.   Loma Messing, LPN   [id number]   Nurse Health Advisor  Nurse Notes: none

## 2021-09-14 ENCOUNTER — Ambulatory Visit (INDEPENDENT_AMBULATORY_CARE_PROVIDER_SITE_OTHER): Payer: Medicare Other

## 2021-09-14 VITALS — Ht 71.0 in | Wt 210.0 lb

## 2021-09-14 DIAGNOSIS — Z0000 Encounter for general adult medical examination without abnormal findings: Secondary | ICD-10-CM | POA: Diagnosis not present

## 2021-09-14 NOTE — Patient Instructions (Addendum)
Ronald Bullock , Thank you for taking time to complete your Medicare Wellness Visit. I appreciate your ongoing commitment to your health goals. Please review the following plan we discussed and let me know if I can assist you in the future.   Screening recommendations/referrals: Colonoscopy: up to date, completed 08/05/21, due 08/05/24 Recommended yearly ophthalmology/optometry visit for glaucoma screening and checkup Recommended yearly dental visit for hygiene and checkup  Vaccinations: Influenza vaccine:  May obtain vaccine at our office or your local pharmacy. Pneumococcal vaccine: Due- 2nd shot, Pneumovax 23, May obtain vaccine at your local pharmacy. Tdap vaccine: up to date, completed 03/03/14, due 03/03/24 Shingles vaccine: up to date   Covid-19: newest booster available at your local pharmacy   Advanced directives: copy on file   Conditions/risks identified: see problem list   Next appointment: Follow up in one year for your annual wellness visit. 09/15/22 @ 9:00am, this will be a telephone visit.   Preventive Care 73 Years and Older, Male Preventive care refers to lifestyle choices and visits with your health care provider that can promote health and wellness. What does preventive care include? A yearly physical exam. This is also called an annual well check. Dental exams once or twice a year. Routine eye exams. Ask your health care provider how often you should have your eyes checked. Personal lifestyle choices, including: Daily care of your teeth and gums. Regular physical activity. Eating a healthy diet. Avoiding tobacco and drug use. Limiting alcohol use. Practicing safe sex. Taking low doses of aspirin every day. Taking vitamin and mineral supplements as recommended by your health care provider. What happens during an annual well check? The services and screenings done by your health care provider during your annual well check will depend on your age, overall health,  lifestyle risk factors, and family history of disease. Counseling  Your health care provider may ask you questions about your: Alcohol use. Tobacco use. Drug use. Emotional well-being. Home and relationship well-being. Sexual activity. Eating habits. History of falls. Memory and ability to understand (cognition). Work and work Statistician. Screening  You may have the following tests or measurements: Height, weight, and BMI. Blood pressure. Lipid and cholesterol levels. These may be checked every 5 years, or more frequently if you are over 42 years old. Skin check. Lung cancer screening. You may have this screening every year starting at age 61 if you have a 30-pack-year history of smoking and currently smoke or have quit within the past 15 years. Fecal occult blood test (FOBT) of the stool. You may have this test every year starting at age 30. Flexible sigmoidoscopy or colonoscopy. You may have a sigmoidoscopy every 5 years or a colonoscopy every 10 years starting at age 29. Prostate cancer screening. Recommendations will vary depending on your family history and other risks. Hepatitis C blood test. Hepatitis B blood test. Sexually transmitted disease (STD) testing. Diabetes screening. This is done by checking your blood sugar (glucose) after you have not eaten for a while (fasting). You may have this done every 1-3 years. Abdominal aortic aneurysm (AAA) screening. You may need this if you are a current or former smoker. Osteoporosis. You may be screened starting at age 27 if you are at high risk. Talk with your health care provider about your test results, treatment options, and if necessary, the need for more tests. Vaccines  Your health care provider may recommend certain vaccines, such as: Influenza vaccine. This is recommended every year. Tetanus, diphtheria, and acellular pertussis (  Tdap, Td) vaccine. You may need a Td booster every 10 years. Zoster vaccine. You may need this  after age 34. Pneumococcal 13-valent conjugate (PCV13) vaccine. One dose is recommended after age 37. Pneumococcal polysaccharide (PPSV23) vaccine. One dose is recommended after age 9. Talk to your health care provider about which screenings and vaccines you need and how often you need them. This information is not intended to replace advice given to you by your health care provider. Make sure you discuss any questions you have with your health care provider. Document Released: 08/29/2015 Document Revised: 04/21/2016 Document Reviewed: 06/03/2015 Elsevier Interactive Patient Education  2017 [employer] [employer] Prevention in the Home Falls can cause injuries. They can happen to people of all ages. There are many things you can do to make your home safe and to help prevent falls. What can I do on the outside of my home? Regularly fix the edges of walkways and driveways and fix any cracks. Remove anything that might make you trip as you walk through a door, such as a raised step or threshold. Trim any bushes or trees on the path to your home. Use bright outdoor lighting. Clear any walking paths of anything that might make someone trip, such as rocks or tools. Regularly check to see if handrails are loose or broken. Make sure that both sides of any steps have handrails. Any raised decks and porches should have guardrails on the edges. Have any leaves, snow, or ice cleared regularly. Use sand or salt on walking paths during winter. Clean up any spills in your garage right away. This includes oil or grease spills. What can I do in the bathroom? Use night lights. Install grab bars by the toilet and in the tub and shower. Do not use towel bars as grab bars. Use non-skid mats or decals in the tub or shower. If you need to sit down in the shower, use a plastic, non-slip stool. Keep the floor dry. Clean up any water that spills on the floor as soon as it happens. Remove soap buildup in the tub or  shower regularly. Attach bath mats securely with double-sided non-slip rug tape. Do not have throw rugs and other things on the floor that can make you trip. What can I do in the bedroom? Use night lights. Make sure that you have a light by your bed that is easy to reach. Do not use any sheets or blankets that are too big for your bed. They should not hang down onto the floor. Have a firm chair that has side arms. You can use this for support while you get dressed. Do not have throw rugs and other things on the floor that can make you trip. What can I do in the kitchen? Clean up any spills right away. Avoid walking on wet floors. Keep items that you use a lot in easy-to-reach places. If you need to reach something above you, use a strong step stool that has a grab bar. Keep electrical cords out of the way. Do not use floor polish or wax that makes floors slippery. If you must use wax, use non-skid floor wax. Do not have throw rugs and other things on the floor that can make you trip. What can I do with my stairs? Do not leave any items on the stairs. Make sure that there are handrails on both sides of the stairs and use them. Fix handrails that are broken or loose. Make sure that handrails are  as long as the stairways. Check any carpeting to make sure that it is firmly attached to the stairs. Fix any carpet that is loose or worn. Avoid having throw rugs at the top or bottom of the stairs. If you do have throw rugs, attach them to the floor with carpet tape. Make sure that you have a light switch at the top of the stairs and the bottom of the stairs. If you do not have them, ask someone to add them for you. What else can I do to help prevent falls? Wear shoes that: Do not have high heels. Have rubber bottoms. Are comfortable and fit you well. Are closed at the toe. Do not wear sandals. If you use a stepladder: Make sure that it is fully opened. Do not climb a closed stepladder. Make  sure that both sides of the stepladder are locked into place. Ask someone to hold it for you, if possible. Clearly mark and make sure that you can see: Any grab bars or handrails. First and last steps. Where the edge of each step is. Use tools that help you move around (mobility aids) if they are needed. These include: Canes. Walkers. Scooters. Crutches. Turn on the lights when you go into a dark area. Replace any light bulbs as soon as they burn out. Set up your furniture so you have a clear path. Avoid moving your furniture around. If any of your floors are uneven, fix them. If there are any pets around you, be aware of where they are. Review your medicines with your doctor. Some medicines can make you feel dizzy. This can increase your chance of falling. Ask your doctor what other things that you can do to help prevent falls. This information is not intended to replace advice given to you by your health care provider. Make sure you discuss any questions you have with your health care provider. Document Released: 05/29/2009 Document Revised: 01/08/2016 Document Reviewed: 09/06/2014 Elsevier Interactive Patient Education  2017 [employer].

## 2021-09-16 ENCOUNTER — Ambulatory Visit (INDEPENDENT_AMBULATORY_CARE_PROVIDER_SITE_OTHER): Payer: Medicare Other | Admitting: Family Medicine

## 2021-09-16 ENCOUNTER — Other Ambulatory Visit: Payer: Self-pay

## 2021-09-16 ENCOUNTER — Encounter: Payer: Self-pay | Admitting: Family Medicine

## 2021-09-16 VITALS — BP 140/74 | HR 62 | Temp 97.3°F | Ht 71.25 in | Wt 220.0 lb

## 2021-09-16 DIAGNOSIS — M48062 Spinal stenosis, lumbar region with neurogenic claudication: Secondary | ICD-10-CM

## 2021-09-16 DIAGNOSIS — Z23 Encounter for immunization: Secondary | ICD-10-CM | POA: Diagnosis not present

## 2021-09-16 DIAGNOSIS — E669 Obesity, unspecified: Secondary | ICD-10-CM

## 2021-09-16 DIAGNOSIS — M1A00X0 Idiopathic chronic gout, unspecified site, without tophus (tophi): Secondary | ICD-10-CM | POA: Diagnosis not present

## 2021-09-16 DIAGNOSIS — R7303 Prediabetes: Secondary | ICD-10-CM | POA: Diagnosis not present

## 2021-09-16 DIAGNOSIS — Z9660 Presence of unspecified orthopedic joint implant: Secondary | ICD-10-CM | POA: Diagnosis not present

## 2021-09-16 DIAGNOSIS — G4733 Obstructive sleep apnea (adult) (pediatric): Secondary | ICD-10-CM

## 2021-09-16 DIAGNOSIS — E785 Hyperlipidemia, unspecified: Secondary | ICD-10-CM | POA: Diagnosis not present

## 2021-09-16 DIAGNOSIS — M159 Polyosteoarthritis, unspecified: Secondary | ICD-10-CM | POA: Diagnosis not present

## 2021-09-16 DIAGNOSIS — I10 Essential (primary) hypertension: Secondary | ICD-10-CM | POA: Diagnosis not present

## 2021-09-16 MED ORDER — LISINOPRIL 5 MG PO TABS: 5.0000 mg | ORAL_TABLET | Freq: Every day | ORAL | 3 refills | Status: DC

## 2021-09-16 MED ORDER — ALLOPURINOL 300 MG PO TABS: 300.0000 mg | ORAL_TABLET | Freq: Every day | ORAL | 3 refills | Status: DC

## 2021-09-16 NOTE — Assessment & Plan Note (Addendum)
Chronic, stable. Encouraged limiting added sugars and sweetened beverages in diet.

## 2021-09-16 NOTE — Assessment & Plan Note (Signed)
-  Continue nightly CPAP °

## 2021-09-16 NOTE — Assessment & Plan Note (Addendum)
Congratulated on weight loss noted. Encouraged healthy diet and lifestyle choices to affect sustainable weight loss.

## 2021-09-16 NOTE — Assessment & Plan Note (Addendum)
Chronic, reviewed ASCVD risk. Declines statin. Reviewed healthy diet choices. Encouraged mediterranean diet. He desires to work on [facility] for 6 months and then recheck cholesterol levels.  The 10-year ASCVD risk score (Arnett DK, et al., 2019) is: 49.6%   Values used to calculate the score:     Age: 71 years     Sex: Male     Is Non-Hispanic African American: No     Diabetic: Yes     Tobacco smoker: No     Systolic Blood Pressure: 524 mmHg     Is BP treated: Yes     HDL Cholesterol: 28.5 mg/dL     Total Cholesterol: 151 mg/dL

## 2021-09-16 NOTE — Assessment & Plan Note (Signed)
Chronic, stable period on allopurinol 300mg daily.  

## 2021-09-16 NOTE — Patient Instructions (Addendum)
Flu shot today  Consider cholesterol medicines. I recommend mediterranean diet as per below. Work on low cholesterol diet and return in 6 months for fasting labs to recheck cholesterol.  Return as needed or in 1 year for next physical.       Mediterranean Diet  Why follow it? Research shows Those who follow the Mediterranean diet have a reduced risk of heart disease  The diet is associated with a reduced incidence of Parkinson's and Alzheimer's diseases People following the diet may have longer life expectancies and lower rates of chronic diseases  The Dietary Guidelines for Americans recommends the Mediterranean diet as an eating plan to promote health and prevent disease  What Is the Mediterranean Diet?  Healthy eating plan based on typical foods and recipes of Mediterranean-style cooking The diet is primarily a plant based diet; these foods should make up a majority of meals   Starches - Plant based foods should make up a majority of meals - They are an important sources of vitamins, minerals, energy, antioxidants, and fiber - Choose whole grains, foods high in fiber and minimally processed items  - Typical grain sources include wheat, oats, barley, corn, brown rice, bulgar, farro, millet, polenta, couscous  - Various types of beans include chickpeas, lentils, fava beans, black beans, white beans   Fruits  Veggies - Large quantities of antioxidant rich fruits & veggies; 6 or more servings  - Vegetables can be eaten raw or lightly drizzled with oil and cooked  - Vegetables common to the traditional Mediterranean Diet include: artichokes, arugula, beets, broccoli, brussel sprouts, cabbage, carrots, celery, collard greens, cucumbers, eggplant, kale, leeks, lemons, lettuce, mushrooms, okra, onions, peas, peppers, potatoes, pumpkin, radishes, rutabaga, shallots, spinach, sweet potatoes, turnips, zucchini - Fruits common to the Mediterranean Diet include: apples, apricots, avocados, cherries,  clementines, dates, figs, grapefruits, grapes, melons, nectarines, oranges, peaches, pears, pomegranates, strawberries, tangerines  Fats - Replace butter and margarine with healthy oils, such as olive oil, canola oil, and tahini  - Limit nuts to no more than a handful a day  - Nuts include walnuts, almonds, pecans, pistachios, pine nuts  - Limit or avoid candied, honey roasted or heavily salted nuts - Olives are central to the [employer] - can be eaten whole or used in a variety of dishes   Meats Protein - Limiting red meat: no more than a few times a month - When eating red meat: choose lean cuts and keep the portion to the size of deck of cards - Eggs: approx. 0 to 4 times a week  - Fish and lean poultry: at least 2 a week  - Healthy protein sources include, chicken, [place], lean beef, lamb - Increase intake of seafood such as tuna, salmon, trout, mackerel, shrimp, scallops - Avoid or limit high fat processed meats such as sausage and bacon  Dairy - Include moderate amounts of low fat dairy products  - Focus on healthy dairy such as fat free yogurt, skim milk, low or reduced fat cheese - Limit dairy products higher in fat such as whole or 2% milk, cheese, ice cream  Alcohol - Moderate amounts of red wine is ok  - No more than 5 oz daily for women (all ages) and men older than age 16  - No more than 10 oz of wine daily for men younger than 19  Other - Limit sweets and other desserts  - Use herbs and spices instead of salt to flavor foods  - Herbs and  spices common to the traditional Mediterranean Diet include: basil, bay leaves, chives, cloves, cumin, fennel, garlic, lavender, marjoram, mint, oregano, parsley, pepper, rosemary, sage, savory, sumac, tarragon, thyme   Its not just a diet, its a lifestyle:  The Mediterranean diet includes lifestyle factors typical of those in the region  Foods, drinks and meals are best eaten with others and savored Daily physical activity is  important for overall good health This could be strenuous exercise like running and aerobics This could also be more leisurely activities such as walking, housework, yard-work, or taking the stairs Moderation is the key; a balanced and healthy diet accommodates most foods and drinks Consider portion sizes and frequency of consumption of certain foods   Meal Ideas & Options:  Breakfast:  Whole wheat toast or whole wheat English muffins with peanut butter & hard boiled egg Steel cut oats topped with apples & cinnamon and skim milk  Fresh fruit: banana, strawberries, melon, berries, peaches  Smoothies: strawberries, bananas, greek yogurt, peanut butter Low fat greek yogurt with blueberries and granola  Egg white omelet with spinach and mushrooms Breakfast couscous: whole wheat couscous, apricots, skim milk, cranberries  Sandwiches:  Hummus and grilled vegetables (peppers, zucchini, squash) on whole wheat bread   Grilled chicken on whole wheat pita with lettuce, tomatoes, cucumbers or tzatziki  Jordan salad on whole wheat bread: tuna salad made with greek yogurt, olives, red peppers, capers, green onions Garlic rosemary lamb pita: lamb sauted with garlic, rosemary, salt & pepper; add lettuce, cucumber, greek yogurt to pita - flavor with lemon juice and black pepper  Seafood:  Mediterranean grilled salmon, seasoned with garlic, basil, parsley, lemon juice and black pepper Shrimp, lemon, and spinach whole-grain pasta salad made with low fat greek yogurt  Seared scallops with lemon orzo  Seared tuna steaks seasoned salt, pepper, coriander topped with tomato mixture of olives, tomatoes, olive oil, minced garlic, parsley, green onions and cappers  Meats:  Herbed greek chicken salad with kalamata olives, cucumber, feta  Red bell peppers stuffed with spinach, bulgur, lean ground beef (or lentils) & topped with feta   Kebabs: skewers of chicken, tomatoes, onions, zucchini, squash  [place] burgers:  made with red onions, mint, dill, lemon juice, feta cheese topped with roasted red peppers Vegetarian Cucumber salad: cucumbers, artichoke hearts, celery, red onion, feta cheese, tossed in olive oil & lemon juice  Hummus and whole grain pita points with a greek salad (lettuce, tomato, feta, olives, cucumbers, red onion) Lentil soup with celery, carrots made with vegetable broth, garlic, salt and pepper  Tabouli salad: parsley, bulgur, mint, scallions, cucumbers, tomato, radishes, lemon juice, olive oil, salt and pepper.

## 2021-09-16 NOTE — Assessment & Plan Note (Signed)
Chronic, adequate. Continue lisinopril 5mg  daily.

## 2021-09-16 NOTE — Assessment & Plan Note (Addendum)
R hip 2014 ([place]) L knee 2014 ([place])

## 2021-09-16 NOTE — Progress Notes (Signed)
Patient ID: Ronald Husky., male    DOB: 16-Apr-1951, 71 y.o.   MRN: [id number]  This visit was conducted in person.  BP 140/74    Pulse 62    Temp (!) 97.3 F (36.3 C) (Temporal)    Ht 5' 11.25" (1.81 m)    Wt 220 lb (99.8 kg)    SpO2 99%    BMI 30.47 kg/m    CC: AMW f/u visit  Subjective:   HPI: Ronald Froelich. is a 71 y.o. male presenting on 09/16/2021 for Annual Exam (MCR prt 2. )   Saw health advisor Monday for medicare wellness visit. Note reviewed.   Stressful period - daughter going through divorce.  Has been drinking more sodas, poor sleep.   No results found.  Flowsheet Row Clinical Support from 09/14/2021 in [place] at [place]  PHQ-2 Total Score 0       Fall Risk  09/14/2021 09/12/2020 09/05/2019 08/30/2018 08/25/2017  Falls in the past year? 0 0 0 0 No  Comment - - - - -  Number falls in past yr: 0 0 0 - -  Injury with Fall? 0 0 0 - -  Risk for fall due to : No Fall Risks Medication side effect Medication side effect - -  Follow up Falls prevention discussed Falls evaluation completed;Falls prevention discussed Falls evaluation completed;Falls prevention discussed - -    12 lb weight loss since last seen 03/2021! More active recently.   Off aspirin due to easy bruising/bleeding.   Spinal stenosis - ambulation limited by back pain. On gabapentin 300mg  nightly PRN - finds it causes insomnia  Denies fmhx cad. Father with h/o CVA age 10 due to carotid stenosis. Sister also had cerebrovascular disease.   Preventative: COLONOSCOPY 07/2021 - TAx3, HPx1, int hem, rpt 5 yrs Ronald Bullock) Prostate - stable, good stream, no nocturia. No fmhx.  Lung cancer screening - not eligible  Flu - yearly  [place] 09/2019, 10/2019, booster 05/2020, 02/2021  Prevnar-13 01/2020, due for pneumovax wants to get at pharmacy Tdap 2015 Shingles - 02/2020, 06/2020 Advanced directive at home, copy scanned in chart 11/2018. Ronald Bullock is HCPOA. Does not  want prolonged life support if irreversible terminal condition. Healthcare agent directive should be followed over advanced directive.  Seat belt use discussed Sunscreen use discussed, no changing moles on skin. Wears hat outdoors. Would like to defer derm eval for a few months.  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 Interim medical history since our last visit reviewed. Allergies and medications reviewed and updated. Outpatient Medications Prior to Visit  Medication Sig Dispense Refill   acetaminophen (TYLENOL 8 HOUR) 650 MG CR tablet Take 2 tablets (1,300 mg total) by mouth 2 (two) times daily as needed for pain.     gabapentin (NEURONTIN) 300 MG capsule Take 300 mg by mouth at bedtime. As needed     Multiple Vitamins-Minerals (CENTRUM SILVER 50+MEN PO) Take by mouth daily.     Omega-3 Fatty Acids (FISH OIL) 1200 MG CAPS Take 2 capsules (2,400 mg total) by mouth daily.     Respiratory  Therapy Supplies (CARETOUCH CPAP & BIPAP HOSE) MISC by Does not apply route.     allopurinol (ZYLOPRIM) 300 MG tablet TAKE 1 TABLET BY MOUTH EVERY DAY 90 tablet 3   lisinopril (ZESTRIL) 5 MG tablet TAKE 1 TABLET BY MOUTH EVERY DAY 90 tablet 3   No facility-administered medications prior to visit.     Per HPI unless specifically indicated in ROS section below Review of Systems  Objective:  BP 140/74    Pulse 62    Temp (!) 97.3 F (36.3 C) (Temporal)    Ht 5' 11.25" (1.81 m)    Wt 220 lb (99.8 kg)    SpO2 99%    BMI 30.47 kg/m   Wt Readings from Last 3 Encounters:  09/16/21 220 lb (99.8 kg)  09/14/21 210 lb (95.3 kg)  08/05/21 230 lb (104.3 kg)       Physical Exam Vitals and nursing note reviewed.  Constitutional:      General: He is not in acute distress.    Appearance: Normal appearance. He is well-developed. He is not ill-appearing.  HENT:     Head: Normocephalic and atraumatic.     Right Ear: Hearing, tympanic membrane, ear canal and external ear normal.     Left Ear: Hearing, tympanic membrane, ear canal and external ear normal.  Eyes:     General: No scleral icterus.    Extraocular Movements: Extraocular movements intact.     Conjunctiva/sclera: Conjunctivae normal.     Pupils: Pupils are equal, round, and reactive to light.  Neck:     Thyroid: No thyroid mass or thyromegaly.     Vascular: No carotid bruit.  Cardiovascular:     Rate and Rhythm: Normal rate and regular rhythm.     Pulses: Normal pulses.          Radial pulses are 2+ on the right side and 2+ on the left side.     Heart sounds: Normal heart sounds. No murmur heard. Pulmonary:     Effort: Pulmonary effort is normal. No respiratory distress.     Breath sounds: Normal breath sounds. No wheezing, rhonchi or rales.  Abdominal:     General: Bowel sounds are normal. There is no distension.     Palpations: Abdomen is soft. There is no mass.     Tenderness: There is no abdominal tenderness. There is no guarding or rebound.     Hernia: No hernia is present.  Musculoskeletal:        General: Normal range of motion.     Cervical back: Normal range of motion and neck supple.     Right lower leg: No edema.     Left lower leg: No edema.  Lymphadenopathy:     Cervical: No cervical adenopathy.  Skin:    General: Skin is warm and dry.     Findings: No rash.  Neurological:     General: No focal deficit present.     Mental Status: He is alert and oriented to person, place, and time.  Psychiatric:        Mood and Affect: Mood normal.        Behavior: Behavior normal.        Thought Content: Thought content normal.        Judgment: Judgment normal.      Results  for orders placed or performed in visit on 09/09/21  PSA, Medicare  Result Value Ref Range   PSA 1.20 0.10 - 4.00 ng/ml  CBC with  Differential/Platelet  Result Value Ref Range   WBC 6.9 4.0 - 10.5 K/uL   RBC 4.31 4.22 - 5.81 Mil/uL   Hemoglobin 13.2 13.0 - 17.0 g/dL   HCT 40.2 39.0 - 52.0 %   MCV 93.4 78.0 - 100.0 fl   MCHC 32.7 30.0 - 36.0 g/dL   RDW 15.5 11.5 - 15.5 %   Platelets 185.0 150.0 - 400.0 K/uL   Neutrophils Relative % 54.9 43.0 - 77.0 %   Lymphocytes Relative 30.3 12.0 - 46.0 %   Monocytes Relative 11.4 3.0 - 12.0 %   Eosinophils Relative 2.8 0.0 - 5.0 %   Basophils Relative 0.6 0.0 - 3.0 %   Neutro Abs 3.8 1.4 - 7.7 K/uL   Lymphs Abs 2.1 0.7 - 4.0 K/uL   Monocytes Absolute 0.8 0.1 - 1.0 K/uL   Eosinophils Absolute 0.2 0.0 - 0.7 K/uL   Basophils Absolute 0.0 0.0 - 0.1 K/uL  Uric acid  Result Value Ref Range   Uric Acid, Serum 6.3 4.0 - 7.8 mg/dL  Hemoglobin A1c  Result Value Ref Range   Hgb A1c MFr Bld 6.1 4.6 - 6.5 %  Lipid panel  Result Value Ref Range   Cholesterol 151 0 - 200 mg/dL   Triglycerides 123.0 0.0 - 149.0 mg/dL   HDL 28.50 (L) >39.00 mg/dL   VLDL 24.6 0.0 - 40.0 mg/dL   LDL Cholesterol 98 0 - 99 mg/dL   Total CHOL/HDL Ratio 5    NonHDL 122.51   Comprehensive metabolic panel  Result Value Ref Range   Sodium 138 135 - 145 mEq/L   Potassium 4.9 3.5 - 5.1 mEq/L   Chloride 104 96 - 112 mEq/L   CO2 26 19 - 32 mEq/L   Glucose, Bld 116 (H) 70 - 99 mg/dL   BUN 18 6 - 23 mg/dL   Creatinine, Ser 0.92 0.40 - 1.50 mg/dL   Total Bilirubin 0.6 0.2 - 1.2 mg/dL   Alkaline Phosphatase 64 39 - 117 U/L   AST 26 0 - 37 U/L   ALT 32 0 - 53 U/L   Total Protein 6.8 6.0 - 8.3 g/dL   Albumin 4.2 3.5 - 5.2 g/dL   GFR 83.31 >60.00 mL/min   Calcium 9.3 8.4 - 10.5 mg/dL    Assessment & Plan:  This visit occurred during the SARS-CoV-2 public health emergency.  Safety protocols were in place, including screening questions prior to the visit, additional usage of  staff PPE, and extensive cleaning of exam room while observing appropriate contact time as indicated for disinfecting solutions.   Problem List Items Addressed This Visit     Gout    Chronic, stable period on allopurinol 300mg  daily.       Prediabetes    Chronic, stable. Encouraged limiting added sugars and sweetened beverages in diet.       Obesity, Class I, BMI 30-34.9    Congratulated on weight loss noted. Encouraged healthy diet and lifestyle choices to affect sustainable weight loss.       Essential hypertension    Chronic, adequate. Continue lisinopril 5mg  daily.       Relevant Medications   lisinopril (ZESTRIL) 5 MG tablet   Hyperlipidemia - Primary    Chronic, reviewed ASCVD risk. Declines statin. Reviewed healthy diet choices. Encouraged mediterranean diet. He desires to work on [facility] for 6 months and then recheck cholesterol levels.  The 10-year ASCVD risk score (Arnett DK, et al., 2019) is: 49.6%   Values used  to calculate the score:     Age: 7 years     Sex: Male     Is Non-Hispanic African American: No     Diabetic: Yes     Tobacco smoker: No     Systolic Blood Pressure: 762 mmHg     Is BP treated: Yes     HDL Cholesterol: 28.5 mg/dL     Total Cholesterol: 151 mg/dL       Relevant Medications   lisinopril (ZESTRIL) 5 MG tablet   Other Relevant Orders   Lipid panel   Osteoarthritis, multiple sites   Relevant Medications   allopurinol (ZYLOPRIM) 300 MG tablet   OSA (obstructive sleep apnea)    Continue nightly CPAP      Lumbar stenosis with neurogenic claudication   S/P joint replacement    R hip 2014 ([place]) L knee 2014 ([place])      Other Visit Diagnoses     Need for influenza vaccination       Relevant Orders   Flu Vaccine QUAD High Dose(Fluad)        Meds ordered this encounter  Medications   allopurinol (ZYLOPRIM) 300 MG tablet    Sig: Take 1 tablet (300 mg total) by mouth daily.    Dispense:  90 tablet    Refill:  3    lisinopril (ZESTRIL) 5 MG tablet    Sig: Take 1 tablet (5 mg total) by mouth daily.    Dispense:  90 tablet    Refill:  3   Orders Placed This Encounter  Procedures   Flu Vaccine QUAD High Dose(Fluad)   Lipid panel    Standing Status:   Future    Standing Expiration Date:   09/16/2022     Patient instructions: Flu shot today  Consider cholesterol medicines. I recommend mediterranean diet as per below. Work on low cholesterol diet and return in 6 months for fasting labs to recheck cholesterol.  Return as needed or in 1 year for next physical.   Follow up plan: Return in about 1 year (around 09/16/2022) for annual exam, prior fasting for blood work.  [name], MD

## 2021-09-29 DIAGNOSIS — Z23 Encounter for immunization: Secondary | ICD-10-CM | POA: Diagnosis not present

## 2021-10-09 DIAGNOSIS — Z20822 Contact with and (suspected) exposure to covid-19: Secondary | ICD-10-CM | POA: Diagnosis not present

## 2021-10-14 ENCOUNTER — Telehealth: Payer: Self-pay

## 2021-10-14 NOTE — Telephone Encounter (Signed)
Received a fax from [place] for Primary Immunodeficency testing for pt.   ? ?Per Dr. Darnell Level, he does not recommend this testing. ? ?Lvm asking pt to call back.  Need to relay Dr. Synthia Innocent message.  ?

## 2021-10-16 NOTE — Telephone Encounter (Signed)
Received a fax from [place] for Primary Immunodeficency testing for pt.   ? ?Per Dr. Darnell Level, he does not recommend this testing. ? ?Lvm asking pt to call back.  Need to relay Dr. Synthia Innocent message.  ?

## 2021-10-19 NOTE — Telephone Encounter (Signed)
Patient notified as instructed by telephone and verbalized understanding. Patient stated that he received a call about this and he thought that it was from Medicare. Patient stated just to dispose of the request. ?

## 2021-10-23 DIAGNOSIS — M48062 Spinal stenosis, lumbar region with neurogenic claudication: Secondary | ICD-10-CM | POA: Diagnosis not present

## 2021-10-27 DIAGNOSIS — M545 Low back pain, unspecified: Secondary | ICD-10-CM | POA: Diagnosis not present

## 2021-11-02 DIAGNOSIS — M4317 Spondylolisthesis, lumbosacral region: Secondary | ICD-10-CM | POA: Diagnosis not present

## 2021-11-09 ENCOUNTER — Other Ambulatory Visit: Payer: Self-pay | Admitting: Orthopedic Surgery

## 2021-11-19 DIAGNOSIS — U071 COVID-19: Secondary | ICD-10-CM | POA: Diagnosis not present

## 2021-11-20 DIAGNOSIS — Z20822 Contact with and (suspected) exposure to covid-19: Secondary | ICD-10-CM | POA: Diagnosis not present

## 2021-11-30 NOTE — Progress Notes (Signed)
Surgical Instructions ? ? ? Your procedure is scheduled on Wednesday, April 26th, 2023. ? ? Report to [facility] Main Entrance "A" at 05:30 A.M., then check in with the Admitting office. ? Call this number if you have problems the morning of surgery: ? [phone] ? ? If you have any questions prior to your surgery date call [phone]: Open Monday-Friday 8am-4pm ? ? ? Remember: ? Do not eat after midnight the night before your surgery ? ?You may drink clear liquids until 04:30 the morning of your surgery.   ?Clear liquids allowed are: Water, Non-Citrus Juices (without pulp), Carbonated Beverages, Clear Tea, Black Coffee ONLY (NO MILK, CREAM OR POWDERED CREAMER of any kind), and Gatorade ? ?Patient Instructions ? ?The night before surgery:  ?No food after midnight. ONLY clear liquids after midnight ? ?The day of surgery (if you do NOT have diabetes):  ?Drink ONE (1) Pre-Surgery Clear Ensure by 04:30 the morning of surgery. Drink in one sitting. Do not sip.  ?This drink was given to you during your hospital  ?pre-op appointment visit. ? ?Nothing else to drink after completing the  ?Pre-Surgery Clear Ensure. ? ? ?       If you have questions, please contact your surgeon?s office.  ?  ? Take these medicines the morning of surgery with A SIP OF WATER:  ? ?allopurinol (ZYLOPRIM) ?acetaminophen (TYLENOL 8 HOUR) - as needed ? ?As of today, STOP taking any Aspirin (unless otherwise instructed by your surgeon) Aleve, Naproxen, Ibuprofen, Motrin, Advil, Goody's, BC's, all herbal medications, fish oil, and all vitamins. ? ? ? The day of surgery: ?         ?Do not wear jewelry  ?Do not wear lotions, powders, colognes, or deodorant. ?Men may shave face and neck. ?Do not bring valuables to the hospital. ? ? ?[facility] is not responsible for any belongings or valuables. .  ? ?Do NOT Smoke (Tobacco/Vaping)  24 hours prior to your procedure ? ?If you use a CPAP at night, you may bring your mask for your overnight stay. ?   ?Contacts, glasses, hearing aids, dentures or partials may not be worn into surgery, please bring cases for these belongings ?  ?For patients admitted to the hospital, discharge time will be determined by your treatment team. ?  ?Patients discharged the day of surgery will not be allowed to drive home, and someone needs to stay with them for 24 hours. ? ? ?SURGICAL WAITING ROOM VISITATION ?Patients having surgery or a procedure in a hospital may have two support people. ?Children under the age of 14 must have an adult with them who is not the patient. ?They may stay in the waiting area during the procedure and may switch out with other visitors. If the patient needs to stay at the hospital during part of their recovery, the visitor guidelines for inpatient rooms apply. ? ?Please refer to the [facility] website for the visitor guidelines for Inpatients (after your surgery is over and you are in a regular room).  ? ? ?Special instructions:   ? ?Oral Hygiene is also important to reduce your risk of infection.  Remember - BRUSH YOUR TEETH THE MORNING OF SURGERY WITH YOUR REGULAR TOOTHPASTE ? ? ?[facility]- Preparing For Surgery ? ?Before surgery, you can play an important role. Because skin is not sterile, your skin needs to be as free of germs as possible. You can reduce the number of germs on your skin by washing with CHG (chlorahexidine gluconate)  Soap before surgery.  CHG is an antiseptic cleaner which kills germs and bonds with the skin to continue killing germs even after washing.   ? ? ?Please do not use if you have an allergy to CHG or antibacterial soaps. If your skin becomes reddened/irritated stop using the CHG.  ?Do not shave (including legs and underarms) for at least 48 hours prior to first CHG shower. It is OK to shave your face. ? ?Please follow these instructions carefully. ?  ? ? Shower the NIGHT BEFORE SURGERY and the MORNING OF SURGERY with CHG Soap.  ? If you chose to wash your hair, wash your  hair first as usual with your normal shampoo. After you shampoo, rinse your hair and body thoroughly to remove the shampoo.  Then [employer] and genitals (private parts) with your normal soap and rinse thoroughly to remove soap. ? ?After that Use CHG Soap as you would any other liquid soap. You can apply CHG directly to the skin and wash gently with a scrungie or a clean washcloth.  ? ?Apply the CHG Soap to your body ONLY FROM THE NECK DOWN.  Do not use on open wounds or open sores. Avoid contact with your eyes, ears, mouth and genitals (private parts). Wash Face and genitals (private parts)  with your normal soap.  ? ?Wash thoroughly, paying special attention to the area where your surgery will be performed. ? ?Thoroughly rinse your body with warm water from the neck down. ? ?DO NOT shower/wash with your normal soap after using and rinsing off the CHG Soap. ? ?Pat yourself dry with a CLEAN TOWEL. ? ?Wear CLEAN PAJAMAS to bed the night before surgery ? ?Place CLEAN SHEETS on your bed the night before your surgery ? ?DO NOT SLEEP WITH PETS. ? ? ?Day of Surgery: ? ?Take a shower with CHG soap. ?Wear Clean/Comfortable clothing the morning of surgery ?Do not apply any deodorants/lotions.   ?Remember to brush your teeth WITH YOUR REGULAR TOOTHPASTE. ? ? ? ?If you received a COVID test during your pre-op visit  it is requested that you wear a mask when out in public, stay away from anyone that may not be feeling well and notify your surgeon if you develop symptoms. If you have been in contact with anyone that has tested positive in the last 10 days please notify you surgeon. ? ?  ?Please read over the following fact sheets that you were given.   ?

## 2021-12-01 ENCOUNTER — Other Ambulatory Visit: Payer: Self-pay

## 2021-12-01 ENCOUNTER — Encounter (HOSPITAL_COMMUNITY): Payer: Self-pay

## 2021-12-01 ENCOUNTER — Encounter (HOSPITAL_COMMUNITY)
Admission: RE | Admit: 2021-12-01 | Discharge: 2021-12-01 | Disposition: A | Discharge status: Home / Self Care | Payer: Medicare Other | Source: Ambulatory Visit | Attending: Orthopedic Surgery | Admitting: Orthopedic Surgery

## 2021-12-01 VITALS — BP 155/80 | HR 67 | Temp 97.5°F | Resp 17 | Ht 71.0 in | Wt 223.0 lb

## 2021-12-01 DIAGNOSIS — Z01818 Encounter for other preprocedural examination: Secondary | ICD-10-CM | POA: Diagnosis not present

## 2021-12-01 DIAGNOSIS — M4317 Spondylolisthesis, lumbosacral region: Secondary | ICD-10-CM | POA: Diagnosis not present

## 2021-12-01 LAB — SURGICAL PCR SCREEN
MRSA, PCR: NEGATIVE
Staphylococcus aureus: POSITIVE — AB

## 2021-12-01 LAB — BASIC METABOLIC PANEL
Anion gap: 7 (ref 5–15)
BUN: 15 mg/dL (ref 8–23)
CO2: 26 mmol/L (ref 22–32)
Calcium: 8.9 mg/dL (ref 8.9–10.3)
Chloride: 106 mmol/L (ref 98–111)
Creatinine, Ser: 1.03 mg/dL (ref 0.61–1.24)
GFR, Estimated: >60 mL/min (ref >60)
Glucose, Bld: 159 mg/dL — ABNORMAL HIGH (ref 70–99)
Potassium: 4.1 mmol/L (ref 3.5–5.1)
Sodium: 139 mmol/L (ref 135–145)

## 2021-12-01 LAB — CBC
HCT: 38.7 % — ABNORMAL LOW (ref 39.0–52.0)
Hemoglobin: 12.5 g/dL — ABNORMAL LOW (ref 13.0–17.0)
MCH: 30.3 pg (ref 26.0–34.0)
MCHC: 32.3 g/dL (ref 30.0–36.0)
MCV: 93.9 fL (ref 80.0–100.0)
Platelets: 164 10*3/uL (ref 150–400)
RBC: 4.12 MIL/uL — ABNORMAL LOW (ref 4.22–5.81)
RDW: 13.6 % (ref 11.5–15.5)
WBC: 6 10*3/uL (ref 4.0–10.5)
nRBC: 0 % (ref 0.0–0.2)

## 2021-12-01 LAB — TYPE AND SCREEN
ABO/RH(D): B POS
Antibody Screen: NEGATIVE

## 2021-12-01 NOTE — Progress Notes (Addendum)
PCP - Dr. [name] ?Cardiologist - denies ? ?PPM/ICD - n/a ?Device Orders - n/a ?Rep Notified - n/a ? ?Chest x-ray - n/a ?EKG - 12/01/21 ?Stress Test - Over 30 years ago and normal per patient ?ECHO - denies ?Cardiac Cath - denies ? ?Sleep Study - 04/2010. +OSA. Wears CPAP nightly ? ? ?Fasting Blood Sugar - n/a ?Checks Blood Sugar _____ times a day- n/a ? ?Blood Thinner Instructions: n/a ?Aspirin Instructions: n/a ? ?ERAS Protcol - Clear liquids until 0430 am day of surgery ?PRE-SURGERY Ensure or G2- Ensure ? ?COVID TEST- N/A ? ? ?Anesthesia review: Yes. Abnormal EKG ? ?Patient denies shortness of breath, fever, cough and chest pain at PAT appointment ? ? ?All instructions explained to the patient, with a verbal understanding of the material. Patient agrees to go over the instructions while at home for a better understanding. Patient also instructed to self quarantine after being tested for COVID-19. The opportunity to ask questions was provided. ? ? ?

## 2021-12-03 ENCOUNTER — Telehealth: Payer: Self-pay | Admitting: Family Medicine

## 2021-12-03 NOTE — Telephone Encounter (Signed)
Pt stated he has an upcoming surgery and he will come by tomorrow to pick up the Health Directive paperwork.  ? ?Best Callback Number: [phone] ?

## 2021-12-03 NOTE — Telephone Encounter (Signed)
Pt walked in and wanted to know if he can have a copy care directive paper work he lost his . Can you please give pt a call  ?

## 2021-12-03 NOTE — Telephone Encounter (Signed)
Pt rtn call.  (See other 12/03/21 phn note) ?

## 2021-12-03 NOTE — Telephone Encounter (Signed)
Noted.  (See other 12/03/21 phn note) ?

## 2021-12-03 NOTE — Telephone Encounter (Signed)
Lvm asking pt to call back.  Need to inform pt a new Advance Directives packet is available for him to pick up. ? ?[Placed (blue) packet at front office.] ?

## 2021-12-04 NOTE — Telephone Encounter (Signed)
Patient is calling needing the correct forms Health Directive paperwork. Please advise ?

## 2021-12-04 NOTE — Telephone Encounter (Signed)
Spoke with pt to get clarification of what pt needs.  States he needs a printed copy of the Advance Directive form he filled out last year so he can have for his surgery scheduled on Tues, 12/08/21.  Informed pt I will have to look through his chart to find it.  But I'll call him when it's ready to pick up.  Gives permission to lvm.  ?

## 2021-12-07 ENCOUNTER — Telehealth: Payer: Self-pay | Admitting: Family Medicine

## 2021-12-07 ENCOUNTER — Ambulatory Visit (INDEPENDENT_AMBULATORY_CARE_PROVIDER_SITE_OTHER): Payer: Medicare Other | Admitting: Cardiology

## 2021-12-07 ENCOUNTER — Encounter: Payer: Self-pay | Admitting: Cardiology

## 2021-12-07 VITALS — BP 114/62 | HR 65 | Ht 71.75 in | Wt 227.2 lb

## 2021-12-07 DIAGNOSIS — Z01818 Encounter for other preprocedural examination: Secondary | ICD-10-CM | POA: Insufficient documentation

## 2021-12-07 DIAGNOSIS — E785 Hyperlipidemia, unspecified: Secondary | ICD-10-CM | POA: Diagnosis not present

## 2021-12-07 DIAGNOSIS — I10 Essential (primary) hypertension: Secondary | ICD-10-CM | POA: Diagnosis not present

## 2021-12-07 DIAGNOSIS — I452 Bifascicular block: Secondary | ICD-10-CM | POA: Insufficient documentation

## 2021-12-07 DIAGNOSIS — R9431 Abnormal electrocardiogram [ECG] [EKG]: Secondary | ICD-10-CM

## 2021-12-07 DIAGNOSIS — Z01810 Encounter for preprocedural cardiovascular examination: Secondary | ICD-10-CM

## 2021-12-07 NOTE — Assessment & Plan Note (Addendum)
Ronald Bullock had previously been very active but is now significantly limited by his lower back pain with radiculopathy.  He also has pretty significant hip and knee pain most notably his left knee which is difficult to straighten out.  As such she has become somewhat immobilized.  However with what he is able to do walking which in some cases could be almost a mile a day, another days he can walk one half of a block, he is not having any symptoms at all chest tightness pressure pain with rest exertion.   ?No lightheadedness dizziness or wooziness, no syncope or near syncope.  No heart failure symptoms and no symptoms of arrhythmia.  Nothing to suggest heart block. ? ? ?His PCP is ordered an echocardiogram which is reasonable to exclude significant wall motion abnormality with left anterior fascicular block, but otherwise this EKG abnormality along would not be any contraindications for proceeding with noncardiac surgery. ? ?Would avoid AV nodal agents, but I do not think he requires any further evaluation other than the echocardiogram has already been ordered. ? ?He is nondiabetic with normal renal function, no stroke, low risk surgery with no active cardiac symptoms.  As such she is considered low risk patient for low risk surgery. ? ?Would recommend proceeding with a OR without any hesitation provided his echocardiogram is remotely normal.  This will be reviewed tomorrow 8 in the next [place] manner.  He is on an ACE inhibitor which probably is best held in the morning of surgery. ? ?In the unfortunate event that something does occur postoperatively, cardiology available to assist if necessary. ? ?He is on CPAP, but does not use routinely.  Would address his respiratory status to ensure safe extubation. ? ?As per Dr. Bosie Clos note, he has an advanced home directive stating his wife [name] is the [place].  The healthcare directive indicates that he would not want prolonged life support measures in the event of  irreversible terminal condition. ? ?

## 2021-12-07 NOTE — Assessment & Plan Note (Signed)
Well-controlled blood pressure.  He is only on low-dose lisinopril.  Would probably actually hold on the morning preop ?

## 2021-12-07 NOTE — Assessment & Plan Note (Addendum)
Referred for cardiology evaluation because of bifascicular block on EKG.  Completely asymptomatic from a cardiac standpoint.  I have seen him today and he has an echocardiogram scheduled for tomorrow.  We reviewed the pathophysiology of bifascicular block in detail, and indicated that this would not be a contraindication for going for his surgery.  He is not actively having any symptoms of heart block or exercise intolerance/chronotropic incompetence. ? ?Indeed there is some correlation between left anterior fascicular block and potential ischemia, he has not had any ischemic symptoms. ? ? ?Crucially, would avoid any AV nodal agents perioperatively.  Therefore we are not giving his a beta-blocker for preop prophylactic cardioprotection. ?For now plan will be to await the results of echocardiogram tomorrow.  Provided there is no evidence of significant wall motion abnormality to suggest prior infarct or reduced EF, I do not see any reason for him not to proceed with this procedure on 12/09/2021. ? ?The vascular asked that I read of the echocardiogram.  Once this is read, we can send the appropriate recommendations. ?

## 2021-12-07 NOTE — Telephone Encounter (Signed)
Left message on vm per dpr. Notifying pt a copy of his Advance Directives (health care power of attorney & living will) are ready to pick up. ? ?[Placed info at front office.] ?

## 2021-12-07 NOTE — Telephone Encounter (Signed)
Left message on vm per dpr relaying Dr. G's message.  

## 2021-12-07 NOTE — Assessment & Plan Note (Signed)
Appropriately monitored by PCP.  Most recent LDL was 98.  This goes along with an A1c of 6.1.  Not on any medications.  Relatively low risk.  ? ?

## 2021-12-07 NOTE — Telephone Encounter (Signed)
Received message Preop EKG was abnormal - new RBBB with LAFB.  ?Will order stat echo and cardiology referral for further evaluation prior to surgery.  ?

## 2021-12-07 NOTE — Progress Notes (Addendum)
? ? ?Primary Care Provider: [name], MD ?[facility] Cardiologist: Glenetta Hew, MD ?Electrophysiologist: None ?Surgeon, Dr. Lynann Bologna ? ?Clinic Note: ?Chief Complaint  ?Patient presents with  ? New Patient (Initial Visit)  ?  HTN, HLD and OSA-EKG with bifascicular block  ? Pre-op Exam  ?  Cardiovascular examination  ? ?=================================== ? ?ASSESSMENT/PLAN  ? ?Problem List Items Addressed This Visit   ? ?  ? Cardiology Problems  ? Hyperlipidemia with target LDL less than 100 (Chronic)  ?  Appropriately monitored by PCP.  Most recent LDL was 98.  This goes along with an A1c of 6.1.  Not on any medications.  Relatively low risk.  ? ? ?  ?  ? Bifascicular block - Primary  ?  Referred for cardiology evaluation because of bifascicular block on EKG.  Completely asymptomatic from a cardiac standpoint.  I have seen him today and he has an echocardiogram scheduled for tomorrow.  We reviewed the pathophysiology of bifascicular block in detail, and indicated that this would not be a contraindication for going for his surgery.  He is not actively having any symptoms of heart block or exercise intolerance/chronotropic incompetence. ? ?Indeed there is some correlation between left anterior fascicular block and potential ischemia, he has not had any ischemic symptoms. ? ? ?Crucially, would avoid any AV nodal agents perioperatively.  Therefore we are not giving his a beta-blocker for preop prophylactic cardioprotection. ?For now plan will be to await the results of echocardiogram tomorrow.  Provided there is no evidence of significant wall motion abnormality to suggest prior infarct or reduced EF, I do not see any reason for him not to proceed with this procedure on 12/09/2021. ? ?The vascular asked that I read of the echocardiogram.  Once this is read, we can send the appropriate recommendations. ? ?  ?  ? Relevant Orders  ? EKG 12-Lead  ? Essential hypertension (Chronic)  ?  Well-controlled blood  pressure.  He is only on low-dose lisinopril.  Would probably actually hold on the morning preop ? ?  ?  ?  ? Other  ? Encounter for preoperative assessment for noncoronary cardiac surgery  ?  Ms. Grays had previously been very active but is now significantly limited by his lower back pain with radiculopathy.  He also has pretty significant hip and knee pain most notably his left knee which is difficult to straighten out.  As such she has become somewhat immobilized.  However with what he is able to do walking which in some cases could be almost a mile a day, another days he can walk one half of a block, he is not having any symptoms at all chest tightness pressure pain with rest exertion.   ?No lightheadedness dizziness or wooziness, no syncope or near syncope.  No heart failure symptoms and no symptoms of arrhythmia.  Nothing to suggest heart block. ? ? ?His PCP is ordered an echocardiogram which is reasonable to exclude significant wall motion abnormality with left anterior fascicular block, but otherwise this EKG abnormality along would not be any contraindications for proceeding with noncardiac surgery. ? ?Would avoid AV nodal agents, but I do not think he requires any further evaluation other than the echocardiogram has already been ordered. ? ?He is nondiabetic with normal renal function, no stroke, low risk surgery with no active cardiac symptoms.  As such she is considered low risk patient for low risk surgery. ? ?Would recommend proceeding with a OR without any hesitation provided his  echocardiogram is remotely normal.  This will be reviewed tomorrow 8 in the next [place] manner.  He is on an ACE inhibitor which probably is best held in the morning of surgery. ? ?In the unfortunate event that something does occur postoperatively, cardiology available to assist if necessary. ? ?He is on CPAP, but does not use routinely.  Would address his respiratory status to ensure safe extubation. ? ?As per Dr.  Bosie Clos note, he has an advanced home directive stating his wife [name] is the [place].  The healthcare directive indicates that he would not want prolonged life support measures in the event of irreversible terminal condition. ? ? ?  ?  ? ?ADDENDUM: ?ECHOCARDIOGRAM RESULTS FROM 12/08/2021 ? 1. Left ventricular ejection fraction, by estimation, is 55 to 60%. Left  ?ventricular ejection fraction by 3D volume is 56 %. The left ventricle has  ?normal function. The left ventricle has no regional wall motion  ?abnormalities. There is mild asymmetric  ?left ventricular hypertrophy of the basal-septal segment. Left ventricular  ?diastolic parameters were normal. The average left ventricular global  ?longitudinal strain is -21.5 %.  ? 2. Right ventricular systolic function is normal. The right ventricular  ?size is mildly enlarged.  ? 3. The mitral valve is normal in structure. Trivial mitral valve  ?regurgitation.  ? 4. The aortic valve is tricuspid. Aortic valve regurgitation is not  ?visualized. Aortic valve sclerosis/calcification is present, without any  ?evidence of aortic stenosis.  ? 5. Aortic dilatation noted. There is mild dilatation of the ascending  ?aorta, measuring 38 mm.  ? ?-> Essentially normal echocardiogram.  Mild LVH/increased thickening but normal diastolic pressures/relaxation.  No wall motion abnormalities to suggest prior heart attack.  Mild calcification/sclerosis of the aortic valve but no stenosis/narrowing.  Otherwise relatively normal echocardiogram. ? ?Based on this result, the patient should be cleared to proceed with surgery without any further cardiac evaluation!! ? ?=================================== ? ?HPI:   ? ?Ronald Bullock. is a 71 y.o. male with PMH notable for spinal stenosis, HTN, HLD, OSA-CPAP, Gout who is being seen today for PREOPEVALUATION of abnormal EKG (right bundle branch block/left anterior fascicular block-bifascicular block) at the request of [name],  MD. ? ?Ronald Bullock. was recently seen by Dr. Danise Mina on September 16, 2021 for annual exam.  Noted 12 pound weight loss since 2022.  Increasing level of activity, but limited by his back and knee pain. ? ?As part of his preoperative evaluation he was seen on April 18 and had an EKG that showed bifascicular block both the change of single block and right bundle branch block with first AV block and PVCs.  This was discussed with his PCP and advised cardiology evaluation.  I cannot see an EKG in the chart since 2020 which did not have either of these findings but he did have left axis deviation.  The right bundle branch does appear to be normal and the progression from the left axis deviation to full left anterior fascicular block does also appear to be new.  These are electrical findings that are not necessarily specific. ? ?Recent Hospitalizations: None ? ?Reviewed  CV studies:   ? ?The following studies were reviewed today: (if available, images/films reviewed: From Epic Chart or Care Everywhere) ?Echo ordered for 12/08/2021-pending.  Has expedited repeat ordered. ? ? ?Interval History:  ? ?Ronald Bullock. presents here today with his wife somewhat unsure as to why he is here besides the fact that he knows  this needs to be done for his surgery that he desperately recall requires because he is very limited with any particular mobility because of back pain with radiculopathy.  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  He also has significant stiffness in his left knee following surgery several years ago that was same time he had his right hip done.  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 and has been somewhat immobilized because of these various joints causing him problems of late, but for the most part even with his injuries, he has remained active to the point that he can.  Of course of late in the last month or so he has not really  been to do much in the way of any activity but prior to that was walking as best he could with no symptoms to suggest any chest pain pressure or dyspnea at rest or exertion.  The only thing he notes that it t

## 2021-12-07 NOTE — Patient Instructions (Addendum)
Medication Instructions:  ?Not needed ? ?*If you need a refill on your cardiac medications before your next appointment, please call your pharmacy* ? ? ?Lab Work: ?Not needed ? ? ? ?Testing/Procedures: ? ? Keep schedule echo on 12/08/21 if test is normal  may continue with surgery on 426/23 ?Your physician has requested that you have an echocardiogram. Echocardiography is a painless test that uses sound waves to create images of your heart. It provides your doctor with information about the size and shape of your heart and how well your heart?s chambers and valves are working. This procedure takes approximately one hour. There are no restrictions for this procedure.  ? ?Follow-Up: ?At [facility], you and your health needs are our priority.  As part of our continuing mission to provide you with exceptional heart care, we have created designated Provider Care Teams.  These Care Teams include your primary Cardiologist (physician) and Advanced Practice Providers (APPs -  Physician Assistants and Nurse Practitioners) who all work together to provide you with the care you need, when you need it. ? ?  ? ?Your next appointment:   ?3 to 4 month(s) ? ?The format for your next appointment:   ?In Person ? ?Provider:   ?Glenetta Hew, MD  ? ? ?Other Instructions ? ?Okay to have back surgery on 12/09/21 if echo on 12/08/21 has no abnormalities ? ?No A-V nodal agents during surgery  ? ? ?

## 2021-12-08 ENCOUNTER — Ambulatory Visit (HOSPITAL_COMMUNITY): Payer: Medicare Other | Attending: Cardiology

## 2021-12-08 DIAGNOSIS — R9431 Abnormal electrocardiogram [ECG] [EKG]: Secondary | ICD-10-CM | POA: Diagnosis not present

## 2021-12-08 HISTORY — PX: TRANSTHORACIC ECHOCARDIOGRAM: SHX275

## 2021-12-08 LAB — ECHOCARDIOGRAM COMPLETE
AR max vel: 2.5 cm2
AV Area VTI: 2.66 cm2
AV Area mean vel: 2.45 cm2
AV Mean grad: 7 mmHg
AV Peak grad: 13.1 mmHg
Ao pk vel: 1.81 m/s
Area-P 1/2: 3.17 cm2
S' Lateral: 2.9 cm

## 2021-12-08 NOTE — Progress Notes (Signed)
Anesthesia Chart Review: ? ?Patient noted to have abnormal EKG preop testing, new bifascicular block.  He is completely asymptomatic from cardiac standpoint.  No known history of CAD.  I reached out to patient's PCP Dr. Danise Mina to review EKG tracing.  He advised patient should have further evaluation prior to undergoing elective surgery.  He ordered echocardiogram and referred patient to cardiology.  Patient was seen by Dr. Ellyn Hack 12/07/2021.  Per note, " Referred for cardiology evaluation because of bifascicular block on EKG.  Completely asymptomatic from a cardiac standpoint.  I have seen him today and he has an echocardiogram scheduled for tomorrow.  We reviewed the pathophysiology of bifascicular block in detail, and indicated that this would not be a contraindication for going for his surgery.  He is not actively having any symptoms of heart block or exercise intolerance/chronotropic incompetence. Indeed there is some correlation between left anterior fascicular block and potential ischemia, he has not had any ischemic symptoms. Crucially, would avoid any AV nodal agents perioperatively.  Therefore we are not giving his a beta-blocker for preop prophylactic cardioprotection. For now plan will be to await the results of echocardiogram tomorrow.  Provided there is no evidence of significant wall motion abnormality to suggest prior infarct or reduced EF, I do not see any reason for him not to proceed with this procedure on 12/09/2021."  Patient subsequently had echocardiogram 12/08/2021 which was essentially normal.  Dr. Ellyn Hack commented, "Based on this result, the patient should be cleared to proceed with surgery without any further cardiac evaluation!!". ? ?OSA, noncompliant with CPAP. ? ?Preop labs reviewed, mild anemia with hemoglobin 12.5, otherwise unremarkable. ? ?EKG 12/07/2021: NSR.  Rate 65.  Right bundle branch block.  Left anterior fascicular block.  Minimal voltage criteria for LVH, may be normal  variant. ? ?TTE 12/08/2021: ? 1. Left ventricular ejection fraction, by estimation, is 55 to 60%. Left  ?ventricular ejection fraction by 3D volume is 56 %. The left ventricle has  ?normal function. The left ventricle has no regional wall motion  ?abnormalities. There is mild asymmetric  ?left ventricular hypertrophy of the basal-septal segment. Left ventricular  ?diastolic parameters were normal. The average left ventricular global  ?longitudinal strain is -21.5 %.  ? 2. Right ventricular systolic function is normal. The right ventricular  ?size is mildly enlarged.  ? 3. The mitral valve is normal in structure. Trivial mitral valve  ?regurgitation.  ? 4. The aortic valve is tricuspid. Aortic valve regurgitation is not  ?visualized. Aortic valve sclerosis/calcification is present, without any  ?evidence of aortic stenosis.  ? 5. Aortic dilatation noted. There is mild dilatation of the ascending  ?aorta, measuring 38 mm.  ? ? ?Karoline Caldwell, PA-C ?[facility]/Anesthesiology ?Phone [phone] ?12/08/2021 2:59 PM ? ? ? ? ?

## 2021-12-08 NOTE — Anesthesia Preprocedure Evaluation (Addendum)
Anesthesia Evaluation  ?Patient identified by MRN, date of birth, ID band ?Patient awake ? ? ? ?Reviewed: ?Allergy & Precautions, NPO status , Patient's Chart, lab work & pertinent test results ? ?Airway ?Mallampati: II ? ?TM Distance: >3 FB ?Neck ROM: Full ? ? ? Dental ? ?(+) Dental Advisory Given, Teeth Intact, Missing, Caps,  ?  ?Pulmonary ?sleep apnea and Continuous Positive Airway Pressure Ventilation ,  ?  ?Pulmonary exam normal ?breath sounds clear to auscultation ? ? ? ? ? ? Cardiovascular ?hypertension, Pt. on medications ?Normal cardiovascular exam ?Rhythm:Regular Rate:Normal ? ?EKG 12/07/21 ?Right BBB, LAFB ? ?Echo 12/08/21 ?1. Left ventricular ejection fraction, by estimation, is 55 to 60%. Left  ?ventricular ejection fraction by 3D volume is 56 %. The left ventricle has normal function. The left ventricle has no regional wall motion abnormalities. There is mild asymmetric left ventricular hypertrophy of the basal-septal segment. Left ventricular diastolic parameters were normal. The average left ventricular global longitudinal strain is -21.5 %.  ??2. Right ventricular systolic function is normal. The right ventricular size is mildly enlarged.  ??3. The mitral valve is normal in structure. Trivial mitral valve regurgitation.  ??4. The aortic valve is tricuspid. Aortic valve regurgitation is not visualized. Aortic valve sclerosis/calcification is present, without any evidence of aortic stenosis.  ??5. Aortic dilatation noted. There is mild dilatation of the ascending aorta, measuring 38 mm.  ?  ?Neuro/Psych ?Ulnar neuropathy right ? Neuromuscular disease negative psych ROS  ? GI/Hepatic ?negative GI ROS, Neg liver ROS,   ?Endo/Other  ?Obesity ?Gout ?Hyperlipidemia ?Hyperglycemia ? Renal/GU ?negative Renal ROS  ?negative genitourinary ?  ?Musculoskeletal ? ?(+) Arthritis , Osteoarthritis,  Spondylolisthesis L3-4, L4-5 with spinal stenosis and neurogenic claudication  ?  Abdominal ?(+) + obese,   ?Peds ? Hematology ? ?(+) Blood dyscrasia, anemia ,   ?Anesthesia Other Findings ? ? Reproductive/Obstetrics ? ?  ? ? ? ? ? ? ? ? ? ? ? ? ? ?  ?  ? ? ? ? ? ?Anesthesia Physical ?Anesthesia Plan ? ?ASA: 3 ? ?Anesthesia Plan: General  ? ?Post-op Pain Management: Dilaudid IV, Precedex, Tylenol PO (pre-op)* and Ketamine IV*  ? ?Induction: Intravenous ? ?PONV Risk Score and Plan: 3 and Treatment may vary due to age or medical condition, Ondansetron and Dexamethasone ? ?Airway Management Planned: Oral ETT ? ?Additional Equipment: None ? ?Intra-op Plan:  ? ?Post-operative Plan: Extubation in OR ? ?Informed Consent: I have reviewed the patients History and Physical, chart, labs and discussed the procedure including the risks, benefits and alternatives for the proposed anesthesia with the patient or authorized representative who has indicated his/her understanding and acceptance.  ? ? ? ?Dental advisory given ? ?Plan Discussed with: CRNA and Anesthesiologist ? ?Anesthesia Plan Comments: (PAT note by Karoline Caldwell, PA-C: ?Patient noted to have abnormal EKG preop testing, new bifascicular block.  He is completely asymptomatic from cardiac standpoint.  No known history of CAD.  I reached out to patient's PCP Dr. Danise Mina to review EKG tracing.  He advised patient should have further evaluation prior to undergoing elective surgery.  He ordered echocardiogram and referred patient to cardiology.  Patient was seen by Dr. Ellyn Hack 12/07/2021.  Per note, "?Referred for cardiology evaluation because of bifascicular block on EKG.  Completely asymptomatic from a cardiac standpoint.  I have seen him today and he has an echocardiogram scheduled for tomorrow.  We reviewed the pathophysiology of bifascicular block in detail, and indicated that this would not be a contraindication for  going for his surgery.  He is not actively having any symptoms of heart block or exercise intolerance/chronotropic incompetence. Indeed  there is some correlation between left anterior fascicular block and potential ischemia, he has not had any ischemic symptoms. Crucially, would avoid any AV nodal agents perioperatively.  Therefore we are not giving his a beta-blocker for preop prophylactic cardioprotection. For now plan will be to await the results of echocardiogram tomorrow.  Provided there is no evidence of significant wall motion abnormality to suggest prior infarct or reduced EF, I do not see any reason for him not to proceed with this procedure on 12/09/2021."  Patient subsequently had echocardiogram 12/08/2021 which was essentially normal.  Dr. Ellyn Hack commented, "Based on this result, the patient should be cleared to proceed with surgery without any further cardiac evaluation!!". ? ?OSA, noncompliant with CPAP. ? ?Preop labs reviewed, mild anemia with hemoglobin 12.5, otherwise unremarkable. ? ?EKG 12/07/2021: NSR.  Rate 65.  Right bundle branch block.  Left anterior fascicular block.  Minimal voltage criteria for LVH, may be normal variant. ? ?TTE 12/08/2021: ??1. Left ventricular ejection fraction, by estimation, is 55 to 60%. Left  ?ventricular ejection fraction by 3D volume is 56 %. The left ventricle has  ?normal function. The left ventricle has no regional wall motion  ?abnormalities. There is mild asymmetric  ?left ventricular hypertrophy of the basal-septal segment. Left ventricular  ?diastolic parameters were normal. The average left ventricular global  ?longitudinal strain is -21.5 %.  ??2. Right ventricular systolic function is normal. The right ventricular  ?size is mildly enlarged.  ??3. The mitral valve is normal in structure. Trivial mitral valve  ?regurgitation.  ??4. The aortic valve is tricuspid. Aortic valve regurgitation is not  ?visualized. Aortic valve sclerosis/calcification is present, without any  ?evidence of aortic stenosis.  ??5. Aortic dilatation noted. There is mild dilatation of the ascending  ?aorta, measuring 38  mm.  ? ?)  ? ? ? ? ?Anesthesia Quick Evaluation ? ?

## 2021-12-09 ENCOUNTER — Other Ambulatory Visit: Payer: Self-pay

## 2021-12-09 ENCOUNTER — Encounter (HOSPITAL_COMMUNITY): Payer: Self-pay | Admitting: Orthopedic Surgery

## 2021-12-09 ENCOUNTER — Ambulatory Visit (HOSPITAL_COMMUNITY): Payer: Medicare Other

## 2021-12-09 ENCOUNTER — Ambulatory Visit (HOSPITAL_COMMUNITY)
Admission: RE | Disposition: A | Discharge status: Home / Self Care | Payer: Self-pay | Source: Ambulatory Visit | Attending: Orthopedic Surgery

## 2021-12-09 ENCOUNTER — Ambulatory Visit (HOSPITAL_BASED_OUTPATIENT_CLINIC_OR_DEPARTMENT_OTHER): Payer: Medicare Other | Admitting: Physician Assistant

## 2021-12-09 ENCOUNTER — Encounter: Payer: Self-pay | Admitting: Family Medicine

## 2021-12-09 ENCOUNTER — Observation Stay (HOSPITAL_COMMUNITY)
Admission: RE | Admit: 2021-12-09 | Discharge: 2021-12-10 | Disposition: A | Discharge status: Home / Self Care | Payer: Medicare Other | Source: Ambulatory Visit | Attending: Orthopedic Surgery | Admitting: Orthopedic Surgery

## 2021-12-09 ENCOUNTER — Ambulatory Visit (HOSPITAL_COMMUNITY): Payer: Medicare Other | Admitting: Physician Assistant

## 2021-12-09 DIAGNOSIS — M4326 Fusion of spine, lumbar region: Secondary | ICD-10-CM | POA: Diagnosis not present

## 2021-12-09 DIAGNOSIS — Z79899 Other long term (current) drug therapy: Secondary | ICD-10-CM | POA: Diagnosis not present

## 2021-12-09 DIAGNOSIS — M4316 Spondylolisthesis, lumbar region: Secondary | ICD-10-CM | POA: Insufficient documentation

## 2021-12-09 DIAGNOSIS — M5416 Radiculopathy, lumbar region: Secondary | ICD-10-CM

## 2021-12-09 DIAGNOSIS — M48062 Spinal stenosis, lumbar region with neurogenic claudication: Secondary | ICD-10-CM | POA: Diagnosis not present

## 2021-12-09 DIAGNOSIS — M48061 Spinal stenosis, lumbar region without neurogenic claudication: Secondary | ICD-10-CM | POA: Diagnosis not present

## 2021-12-09 DIAGNOSIS — Z96653 Presence of artificial knee joint, bilateral: Secondary | ICD-10-CM | POA: Insufficient documentation

## 2021-12-09 DIAGNOSIS — Z20822 Contact with and (suspected) exposure to covid-19: Secondary | ICD-10-CM | POA: Diagnosis not present

## 2021-12-09 DIAGNOSIS — I10 Essential (primary) hypertension: Secondary | ICD-10-CM | POA: Insufficient documentation

## 2021-12-09 DIAGNOSIS — Z96641 Presence of right artificial hip joint: Secondary | ICD-10-CM | POA: Insufficient documentation

## 2021-12-09 DIAGNOSIS — Z9989 Dependence on other enabling machines and devices: Secondary | ICD-10-CM | POA: Diagnosis not present

## 2021-12-09 DIAGNOSIS — M5410 Radiculopathy, site unspecified: Principal | ICD-10-CM | POA: Diagnosis present

## 2021-12-09 DIAGNOSIS — Z981 Arthrodesis status: Secondary | ICD-10-CM | POA: Diagnosis not present

## 2021-12-09 DIAGNOSIS — I77810 Thoracic aortic ectasia: Secondary | ICD-10-CM | POA: Insufficient documentation

## 2021-12-09 DIAGNOSIS — G4733 Obstructive sleep apnea (adult) (pediatric): Secondary | ICD-10-CM | POA: Diagnosis not present

## 2021-12-09 DIAGNOSIS — M532X6 Spinal instabilities, lumbar region: Secondary | ICD-10-CM | POA: Diagnosis not present

## 2021-12-09 HISTORY — PX: TRANSFORAMINAL LUMBAR INTERBODY FUSION (TLIF) WITH PEDICLE SCREW FIXATION 2 LEVEL: SHX6142

## 2021-12-09 SURGERY — TRANSFORAMINAL LUMBAR INTERBODY FUSION (TLIF) WITH PEDICLE SCREW FIXATION 2 LEVEL
Anesthesia: General | Laterality: Left

## 2021-12-09 MED ORDER — FLEET ENEMA 7-19 GM/118ML RE ENEM: 1.0000 | ENEMA | Freq: Once | RECTAL | Status: DC | PRN

## 2021-12-09 MED ORDER — PHENYLEPHRINE 80 MCG/ML (10ML) SYRINGE FOR IV PUSH (FOR BLOOD PRESSURE SUPPORT)
PREFILLED_SYRINGE | INTRAVENOUS | Status: AC
  Filled 2021-12-09: qty 10

## 2021-12-09 MED ORDER — ALLOPURINOL 300 MG PO TABS: 300.0000 mg | ORAL_TABLET | Freq: Every day | ORAL | Status: DC

## 2021-12-09 MED ORDER — 0.9 % SODIUM CHLORIDE (POUR BTL) OPTIME
TOPICAL | Status: DC | PRN
  Administered 2021-12-09: 1000 mL

## 2021-12-09 MED ORDER — THROMBIN 20000 UNITS EX SOLR
CUTANEOUS | Status: DC | PRN
Start: 2021-12-09 — End: 2021-12-09
  Administered 2021-12-09: 20000 [IU] via TOPICAL

## 2021-12-09 MED ORDER — GLYCOPYRROLATE PF 0.2 MG/ML IJ SOSY
PREFILLED_SYRINGE | INTRAMUSCULAR | Status: DC | PRN
  Administered 2021-12-09: .2 mg via INTRAVENOUS

## 2021-12-09 MED ORDER — EPHEDRINE 5 MG/ML INJ
INTRAVENOUS | Status: AC
  Filled 2021-12-09: qty 5

## 2021-12-09 MED ORDER — DOCUSATE SODIUM 100 MG PO CAPS
100.0000 mg | ORAL_CAPSULE | Freq: Two times a day (BID) | ORAL | Status: DC
  Administered 2021-12-09: 100 mg via ORAL
  Filled 2021-12-09: qty 1

## 2021-12-09 MED ORDER — ACETAMINOPHEN 10 MG/ML IV SOLN
INTRAVENOUS | Status: AC
  Filled 2021-12-09: qty 100

## 2021-12-09 MED ORDER — ACETAMINOPHEN 325 MG PO TABS: 650.0000 mg | ORAL_TABLET | ORAL | Status: DC | PRN

## 2021-12-09 MED ORDER — GABAPENTIN 300 MG PO CAPS
300.0000 mg | ORAL_CAPSULE | Freq: Every evening | ORAL | Status: DC | PRN
  Administered 2021-12-09: 300 mg via ORAL
  Filled 2021-12-09: qty 1

## 2021-12-09 MED ORDER — LIDOCAINE 2% (20 MG/ML) 5 ML SYRINGE
INTRAMUSCULAR | Status: AC
  Filled 2021-12-09: qty 5

## 2021-12-09 MED ORDER — PROPOFOL 10 MG/ML IV BOLUS
INTRAVENOUS | Status: DC | PRN
  Administered 2021-12-09: 100 mg via INTRAVENOUS

## 2021-12-09 MED ORDER — BUPIVACAINE-EPINEPHRINE (PF) 0.25% -1:200000 IJ SOLN
INTRAMUSCULAR | Status: AC
  Filled 2021-12-09: qty 30

## 2021-12-09 MED ORDER — POTASSIUM CHLORIDE IN NACL 20-0.9 MEQ/L-% IV SOLN: INTRAVENOUS | Status: DC

## 2021-12-09 MED ORDER — HYDROCODONE-ACETAMINOPHEN 5-325 MG PO TABS
1.0000 | ORAL_TABLET | ORAL | Status: DC | PRN
  Administered 2021-12-09: 1 via ORAL
  Filled 2021-12-09: qty 1

## 2021-12-09 MED ORDER — ONDANSETRON HCL 4 MG/2ML IJ SOLN: 4.0000 mg | Freq: Four times a day (QID) | INTRAMUSCULAR | Status: DC | PRN

## 2021-12-09 MED ORDER — FENTANYL CITRATE (PF) 250 MCG/5ML IJ SOLN
INTRAMUSCULAR | Status: DC | PRN
  Administered 2021-12-09 (×2): 50 ug via INTRAVENOUS
  Administered 2021-12-09: 150 ug via INTRAVENOUS

## 2021-12-09 MED ORDER — POVIDONE-IODINE 7.5 % EX SOLN: Freq: Once | CUTANEOUS | Status: DC

## 2021-12-09 MED ORDER — ONDANSETRON HCL 4 MG/2ML IJ SOLN: 4.0000 mg | Freq: Once | INTRAMUSCULAR | Status: DC | PRN

## 2021-12-09 MED ORDER — BISACODYL 5 MG PO TBEC: 5.0000 mg | DELAYED_RELEASE_TABLET | Freq: Every day | ORAL | Status: DC | PRN

## 2021-12-09 MED ORDER — LACTATED RINGERS IV SOLN
INTRAVENOUS | Status: DC
Start: 2021-12-09 — End: 2021-12-09

## 2021-12-09 MED ORDER — ADULT MULTIVITAMIN W/MINERALS CH: 1.0000 | ORAL_TABLET | Freq: Every day | ORAL | Status: DC

## 2021-12-09 MED ORDER — FENTANYL CITRATE (PF) 250 MCG/5ML IJ SOLN
INTRAMUSCULAR | Status: AC
  Filled 2021-12-09: qty 5

## 2021-12-09 MED ORDER — PROPOFOL 10 MG/ML IV BOLUS
INTRAVENOUS | Status: AC
  Filled 2021-12-09: qty 20

## 2021-12-09 MED ORDER — ALBUMIN HUMAN 5 % IV SOLN: INTRAVENOUS | Status: DC | PRN

## 2021-12-09 MED ORDER — MENTHOL 3 MG MT LOZG: 1.0000 | LOZENGE | OROMUCOSAL | Status: DC | PRN

## 2021-12-09 MED ORDER — CEFAZOLIN SODIUM 1 G IJ SOLR
INTRAMUSCULAR | Status: AC
  Filled 2021-12-09: qty 20

## 2021-12-09 MED ORDER — BUPIVACAINE LIPOSOME 1.3 % IJ SUSP
INTRAMUSCULAR | Status: DC | PRN
  Administered 2021-12-09: 20 mL

## 2021-12-09 MED ORDER — PHENYLEPHRINE 80 MCG/ML (10ML) SYRINGE FOR IV PUSH (FOR BLOOD PRESSURE SUPPORT)
PREFILLED_SYRINGE | INTRAVENOUS | Status: DC | PRN
  Administered 2021-12-09 (×4): 80 ug via INTRAVENOUS

## 2021-12-09 MED ORDER — METHOCARBAMOL 500 MG PO TABS
500.0000 mg | ORAL_TABLET | Freq: Four times a day (QID) | ORAL | Status: DC | PRN
  Administered 2021-12-09 – 2021-12-10 (×2): 500 mg via ORAL
  Filled 2021-12-09 (×2): qty 1

## 2021-12-09 MED ORDER — BUPIVACAINE-EPINEPHRINE 0.25% -1:200000 IJ SOLN
INTRAMUSCULAR | Status: DC | PRN
  Administered 2021-12-09: 30 mL

## 2021-12-09 MED ORDER — ACETAMINOPHEN 650 MG RE SUPP: 650.0000 mg | RECTAL | Status: DC | PRN

## 2021-12-09 MED ORDER — ACETAMINOPHEN 10 MG/ML IV SOLN
INTRAVENOUS | Status: DC | PRN
  Administered 2021-12-09: 1000 mg via INTRAVENOUS

## 2021-12-09 MED ORDER — ONDANSETRON HCL 4 MG/2ML IJ SOLN
INTRAMUSCULAR | Status: DC | PRN
  Administered 2021-12-09: 4 mg via INTRAVENOUS

## 2021-12-09 MED ORDER — MIDAZOLAM HCL 2 MG/2ML IJ SOLN
INTRAMUSCULAR | Status: AC
  Filled 2021-12-09: qty 2

## 2021-12-09 MED ORDER — SENNOSIDES-DOCUSATE SODIUM 8.6-50 MG PO TABS: 1.0000 | ORAL_TABLET | Freq: Every evening | ORAL | Status: DC | PRN

## 2021-12-09 MED ORDER — SODIUM CHLORIDE 0.9 % IV SOLN
250.0000 mL | INTRAVENOUS | Status: DC
  Administered 2021-12-09: 250 mL via INTRAVENOUS

## 2021-12-09 MED ORDER — ROCURONIUM BROMIDE 10 MG/ML (PF) SYRINGE
PREFILLED_SYRINGE | INTRAVENOUS | Status: DC | PRN
Start: 2021-12-09 — End: 2021-12-09
  Administered 2021-12-09: 70 mg via INTRAVENOUS
  Administered 2021-12-09: 20 mg via INTRAVENOUS
  Administered 2021-12-09: 30 mg via INTRAVENOUS
  Administered 2021-12-09: 20 mg via INTRAVENOUS

## 2021-12-09 MED ORDER — THROMBIN 20000 UNITS EX KIT
PACK | CUTANEOUS | Status: AC
  Filled 2021-12-09: qty 1

## 2021-12-09 MED ORDER — SODIUM CHLORIDE 0.9% FLUSH: 3.0000 mL | INTRAVENOUS | Status: DC | PRN

## 2021-12-09 MED ORDER — SUGAMMADEX SODIUM 200 MG/2ML IV SOLN
INTRAVENOUS | Status: DC | PRN
  Administered 2021-12-09: 200 mg via INTRAVENOUS

## 2021-12-09 MED ORDER — LACTATED RINGERS IV SOLN: INTRAVENOUS | Status: DC | PRN

## 2021-12-09 MED ORDER — CEFAZOLIN SODIUM-DEXTROSE 2-4 GM/100ML-% IV SOLN
INTRAVENOUS | Status: AC
  Filled 2021-12-09: qty 100

## 2021-12-09 MED ORDER — SODIUM CHLORIDE 0.9% FLUSH
3.0000 mL | Freq: Two times a day (BID) | INTRAVENOUS | Status: DC
  Administered 2021-12-09 (×2): 3 mL via INTRAVENOUS

## 2021-12-09 MED ORDER — ONDANSETRON HCL 4 MG/2ML IJ SOLN
INTRAMUSCULAR | Status: AC
  Filled 2021-12-09: qty 2

## 2021-12-09 MED ORDER — BUPIVACAINE LIPOSOME 1.3 % IJ SUSP
INTRAMUSCULAR | Status: AC
  Filled 2021-12-09: qty 20

## 2021-12-09 MED ORDER — CHLORHEXIDINE GLUCONATE 0.12 % MT SOLN
15.0000 mL | Freq: Once | OROMUCOSAL | Status: AC
  Administered 2021-12-09: 15 mL via OROMUCOSAL
  Filled 2021-12-09: qty 15

## 2021-12-09 MED ORDER — ALUM & MAG HYDROXIDE-SIMETH 200-200-20 MG/5ML PO SUSP: 30.0000 mL | Freq: Four times a day (QID) | ORAL | Status: DC | PRN

## 2021-12-09 MED ORDER — MIDAZOLAM HCL 2 MG/2ML IJ SOLN
INTRAMUSCULAR | Status: DC | PRN
Start: 2021-12-09 — End: 2021-12-09
  Administered 2021-12-09 (×2): 1 mg via INTRAVENOUS

## 2021-12-09 MED ORDER — ZOLPIDEM TARTRATE 5 MG PO TABS: 5.0000 mg | ORAL_TABLET | Freq: Every evening | ORAL | Status: DC | PRN

## 2021-12-09 MED ORDER — ONDANSETRON HCL 4 MG PO TABS: 4.0000 mg | ORAL_TABLET | Freq: Four times a day (QID) | ORAL | Status: DC | PRN

## 2021-12-09 MED ORDER — LISINOPRIL 5 MG PO TABS
5.0000 mg | ORAL_TABLET | Freq: Every day | ORAL | Status: DC
  Filled 2021-12-09: qty 1

## 2021-12-09 MED ORDER — ROCURONIUM BROMIDE 10 MG/ML (PF) SYRINGE
PREFILLED_SYRINGE | INTRAVENOUS | Status: AC
  Filled 2021-12-09: qty 20

## 2021-12-09 MED ORDER — EPHEDRINE SULFATE-NACL 50-0.9 MG/10ML-% IV SOSY
PREFILLED_SYRINGE | INTRAVENOUS | Status: DC | PRN
  Administered 2021-12-09: 5 mg via INTRAVENOUS
  Administered 2021-12-09: 10 mg via INTRAVENOUS
  Administered 2021-12-09: 5 mg via INTRAVENOUS

## 2021-12-09 MED ORDER — ORAL CARE MOUTH RINSE: 15.0000 mL | Freq: Once | OROMUCOSAL | Status: AC

## 2021-12-09 MED ORDER — LIDOCAINE 2% (20 MG/ML) 5 ML SYRINGE
INTRAMUSCULAR | Status: DC | PRN
Start: 2021-12-09 — End: 2021-12-09
  Administered 2021-12-09: 80 mg via INTRAVENOUS

## 2021-12-09 MED ORDER — PHENYLEPHRINE HCL-NACL 20-0.9 MG/250ML-% IV SOLN
INTRAVENOUS | Status: DC | PRN
  Administered 2021-12-09: 50 ug/min via INTRAVENOUS

## 2021-12-09 MED ORDER — OXYCODONE-ACETAMINOPHEN 5-325 MG PO TABS
1.0000 | ORAL_TABLET | ORAL | Status: DC | PRN
  Administered 2021-12-09: 2 via ORAL
  Administered 2021-12-09 – 2021-12-10 (×2): 1 via ORAL
  Filled 2021-12-09: qty 2
  Filled 2021-12-09 (×2): qty 1

## 2021-12-09 MED ORDER — HYDROMORPHONE HCL 1 MG/ML IJ SOLN: 0.2500 mg | INTRAMUSCULAR | Status: DC | PRN

## 2021-12-09 MED ORDER — MORPHINE SULFATE (PF) 2 MG/ML IV SOLN: 1.0000 mg | INTRAVENOUS | Status: DC | PRN

## 2021-12-09 MED ORDER — ACETAMINOPHEN 500 MG PO TABS: 500.0000 mg | ORAL_TABLET | Freq: Four times a day (QID) | ORAL | Status: DC | PRN

## 2021-12-09 MED ORDER — CEFAZOLIN SODIUM-DEXTROSE 2-4 GM/100ML-% IV SOLN
2.0000 g | Freq: Three times a day (TID) | INTRAVENOUS | Status: AC
  Administered 2021-12-09 – 2021-12-10 (×2): 2 g via INTRAVENOUS
  Filled 2021-12-09 (×2): qty 100

## 2021-12-09 MED ORDER — PHENOL 1.4 % MT LIQD: 1.0000 | OROMUCOSAL | Status: DC | PRN

## 2021-12-09 MED ORDER — METHOCARBAMOL 1000 MG/10ML IJ SOLN
500.0000 mg | Freq: Four times a day (QID) | INTRAVENOUS | Status: DC | PRN
  Filled 2021-12-09: qty 5

## 2021-12-09 MED ORDER — GLYCOPYRROLATE PF 0.2 MG/ML IJ SOSY
PREFILLED_SYRINGE | INTRAMUSCULAR | Status: AC
  Filled 2021-12-09: qty 1

## 2021-12-09 MED ORDER — CEFAZOLIN SODIUM-DEXTROSE 2-4 GM/100ML-% IV SOLN
2.0000 g | INTRAVENOUS | Status: AC
  Administered 2021-12-09 (×2): 2 g via INTRAVENOUS
  Filled 2021-12-09: qty 100

## 2021-12-09 SURGICAL SUPPLY — 92 items
AGENT HMST KT MTR STRL THRMB (HEMOSTASIS)
APL SKNCLS STERI-STRIP NONHPOA (GAUZE/BANDAGES/DRESSINGS) ×1
BAG COUNTER SPONGE SURGICOUNT (BAG) ×3 IMPLANT
BAG SPNG CNTER NS LX DISP (BAG) ×1
BENZOIN TINCTURE PRP APPL 2/3 (GAUZE/BANDAGES/DRESSINGS) ×3 IMPLANT
BLADE CLIPPER SURG (BLADE) IMPLANT
BUR PRESCISION 1.7 ELITE (BURR) ×3 IMPLANT
BUR ROUND FLUTED 5 RND (BURR) ×3 IMPLANT
BUR ROUND PRECISION 4.0 (BURR) IMPLANT
BUR SABER RD CUTTING 3.0 (BURR) IMPLANT
CAGE SABLE 10X30 9-16 8D (Cage) ×2 IMPLANT
CANNULA GRAFT BNE VG PRE-FILL (Bone Implant) IMPLANT
CARTRIDGE OIL MAESTRO DRILL (MISCELLANEOUS) ×2 IMPLANT
CNTNR URN SCR LID CUP LEK RST (MISCELLANEOUS) ×2 IMPLANT
CONT SPEC 4OZ STRL OR WHT (MISCELLANEOUS) ×2
COVER MAYO STAND STRL (DRAPES) ×6 IMPLANT
COVER SURGICAL LIGHT HANDLE (MISCELLANEOUS) ×3 IMPLANT
DIFFUSER DRILL AIR PNEUMATIC (MISCELLANEOUS) ×3 IMPLANT
DISPENSER GRAFT BNE VG (MISCELLANEOUS) IMPLANT
DISPENSER VIVIGEN BONE GRAFT (MISCELLANEOUS) ×2 IMPLANT
DRAIN CHANNEL 15F RND FF W/TCR (WOUND CARE) IMPLANT
DRAPE C-ARM 42X72 X-RAY (DRAPES) ×3 IMPLANT
DRAPE C-ARMOR (DRAPES) IMPLANT
DRAPE POUCH INSTRU U-SHP 10X18 (DRAPES) ×3 IMPLANT
DRAPE SURG 17X23 STRL (DRAPES) ×12 IMPLANT
DURAPREP 26ML APPLICATOR (WOUND CARE) ×3 IMPLANT
ELECT BLADE 4.0 EZ CLEAN MEGAD (MISCELLANEOUS) ×2
ELECT CAUTERY BLADE 6.4 (BLADE) ×3 IMPLANT
ELECT REM PT RETURN 9FT ADLT (ELECTROSURGICAL) ×2
ELECTRODE BLDE 4.0 EZ CLN MEGD (MISCELLANEOUS) ×2 IMPLANT
ELECTRODE REM PT RTRN 9FT ADLT (ELECTROSURGICAL) ×2 IMPLANT
EVACUATOR SILICONE 100CC (DRAIN) IMPLANT
FILTER STRAW FLUID ASPIR (MISCELLANEOUS) ×3 IMPLANT
GAUZE 4X4 16PLY ~~LOC~~+RFID DBL (SPONGE) ×3 IMPLANT
GAUZE SPONGE 4X4 12PLY STRL (GAUZE/BANDAGES/DRESSINGS) ×3 IMPLANT
GAUZE SPONGE 4X4 16PLY XRAY LF (GAUZE/BANDAGES/DRESSINGS) ×1 IMPLANT
GLOVE BIO SURGEON STRL SZ7 (GLOVE) ×3 IMPLANT
GLOVE BIO SURGEON STRL SZ8 (GLOVE) ×3 IMPLANT
GLOVE BIOGEL PI IND STRL 7.0 (GLOVE) ×2 IMPLANT
GLOVE BIOGEL PI IND STRL 8 (GLOVE) ×2 IMPLANT
GLOVE BIOGEL PI INDICATOR 7.0 (GLOVE) ×1
GLOVE BIOGEL PI INDICATOR 8 (GLOVE) ×1
GLOVE SURG ENC MOIS LTX SZ6.5 (GLOVE) ×3 IMPLANT
GOWN STRL REUS W/ TWL LRG LVL3 (GOWN DISPOSABLE) ×4 IMPLANT
GOWN STRL REUS W/ TWL XL LVL3 (GOWN DISPOSABLE) ×2 IMPLANT
GOWN STRL REUS W/TWL LRG LVL3 (GOWN DISPOSABLE) ×4
GOWN STRL REUS W/TWL XL LVL3 (GOWN DISPOSABLE) ×2
GRAFT BONE CANNULA VIVIGEN 3 (Bone Implant) ×12 IMPLANT
IV CATH 14GX2 1/4 (CATHETERS) ×3 IMPLANT
KIT BASIN OR (CUSTOM PROCEDURE TRAY) ×3 IMPLANT
KIT POSITION SURG JACKSON T1 (MISCELLANEOUS) ×3 IMPLANT
KIT TURNOVER KIT B (KITS) ×3 IMPLANT
MARKER SKIN DUAL TIP RULER LAB (MISCELLANEOUS) ×6 IMPLANT
NDL 18GX1X1/2 (RX/OR ONLY) (NEEDLE) ×2 IMPLANT
NDL HYPO 25GX1X1/2 BEV (NEEDLE) ×2 IMPLANT
NDL SPNL 18GX3.5 QUINCKE PK (NEEDLE) ×4 IMPLANT
NEEDLE 18GX1X1/2 (RX/OR ONLY) (NEEDLE) ×2 IMPLANT
NEEDLE 22X1 1/2 (OR ONLY) (NEEDLE) ×6 IMPLANT
NEEDLE HYPO 25GX1X1/2 BEV (NEEDLE) ×2 IMPLANT
NEEDLE SPNL 18GX3.5 QUINCKE PK (NEEDLE) ×4 IMPLANT
NS IRRIG 1000ML POUR BTL (IV SOLUTION) ×3 IMPLANT
OIL CARTRIDGE MAESTRO DRILL (MISCELLANEOUS) ×2
PACK LAMINECTOMY ORTHO (CUSTOM PROCEDURE TRAY) ×3 IMPLANT
PACK UNIVERSAL I (CUSTOM PROCEDURE TRAY) ×3 IMPLANT
PAD ARMBOARD 7.5X6 YLW CONV (MISCELLANEOUS) ×6 IMPLANT
PATTIES SURGICAL .5 X1 (DISPOSABLE) ×3 IMPLANT
PATTIES SURGICAL .5X1.5 (GAUZE/BANDAGES/DRESSINGS) ×3 IMPLANT
ROD EXPEDIUM PRE BENT 5.5X75 (Rod) ×2 IMPLANT
SCREW SET SINGLE INNER (Screw) ×6 IMPLANT
SCREW VIPER CORT FIX 6.00X30 (Screw) ×4 IMPLANT
SCREW VIPER CORT FIX 6X35 (Screw) ×2 IMPLANT
SPONGE INTESTINAL PEANUT (DISPOSABLE) ×3 IMPLANT
SPONGE SURGIFOAM ABS GEL 100 (HEMOSTASIS) ×3 IMPLANT
STRIP CLOSURE SKIN 1/2X4 (GAUZE/BANDAGES/DRESSINGS) ×5 IMPLANT
SURGIFLO W/THROMBIN 8M KIT (HEMOSTASIS) IMPLANT
SUT MNCRL AB 4-0 PS2 18 (SUTURE) ×3 IMPLANT
SUT VIC AB 0 CT1 18XCR BRD 8 (SUTURE) ×2 IMPLANT
SUT VIC AB 0 CT1 8-18 (SUTURE) ×2
SUT VIC AB 1 CT1 18XCR BRD 8 (SUTURE) ×2 IMPLANT
SUT VIC AB 1 CT1 8-18 (SUTURE) ×2
SUT VIC AB 2-0 CT2 18 VCP726D (SUTURE) ×3 IMPLANT
SYR 20ML LL LF (SYRINGE) ×6 IMPLANT
SYR BULB IRRIG 60ML STRL (SYRINGE) ×3 IMPLANT
SYR CONTROL 10ML LL (SYRINGE) ×6 IMPLANT
SYR TB 1ML LUER SLIP (SYRINGE) ×3 IMPLANT
TAP EXPEDIUM DL 4.35 (INSTRUMENTS) ×1 IMPLANT
TAP EXPEDIUM DL 5.0 (INSTRUMENTS) ×1 IMPLANT
TAP EXPEDIUM DL 6.0 (INSTRUMENTS) ×1 IMPLANT
TAPE CLOTH SURG 4X10 WHT LF (GAUZE/BANDAGES/DRESSINGS) ×1 IMPLANT
TRAY FOLEY MTR SLVR 16FR STAT (SET/KITS/TRAYS/PACK) ×3 IMPLANT
WATER STERILE IRR 1000ML POUR (IV SOLUTION) ×3 IMPLANT
YANKAUER SUCT BULB TIP NO VENT (SUCTIONS) ×3 IMPLANT

## 2021-12-09 NOTE — H&P (Signed)
? ? ? ?PREOPERATIVE H&P ? ?Chief Complaint: Bilteral leg pain ? ?HPI: ?Newton Frutiger. is a 71 y.o. male who presents with ongoing pain in the bilateral legs ? ?MRI reveals severe stenosis at L3/4 and L4/5, as well as instability ? ?Patient has failed multiple forms of conservative care and continues to have pain (see office notes for additional details regarding the patient's full course of treatment) ? ?Past Medical History:  ?Diagnosis Date  ? Arthritis   ? L end stage knee DKD, mild R knee DJD, R hip end stage DJD  ? DDD (degenerative disc disease), cervical   ? multilevel  ? Gout   ? rare flares  ? HLD (hyperlipidemia)   ? Low HDL  ? Hyperglycemia   ? Hypertension   ? Lumbar spinal stenosis 11/2014  ? severe central canal stenosis L3/4;L4/5 bilat facet hypertrophy, annular disc bulging, grade 1 sphondylolisthesis (Sharnay Cashion) planned PT and ESI Mina Marble)  ? OSA on CPAP   ? Osteoarthritis   ? R knee and hip, s/p L TKR  ? ?Past Surgical History:  ?Procedure Laterality Date  ? cardiolyte  1997  ? WNL  ? carotid US  1997  ? WNL  ? CATARACT EXTRACTION W/PHACO Left 12/24/2014  ? Procedure: CATARACT EXTRACTION PHACO AND INTRAOCULAR LENS PLACEMENT ([facility]);  Surgeon: Birder Robson, MD; [place] 01:16AP% 26.4Cde 20.17  ? COLONOSCOPY  1998  ? WNL  ? COLONOSCOPY  06/2010  ? WNL, internal hemorrhoids, rec rpt 10 yrs  ? COLONOSCOPY  07/2021  ? TAx3, HPx1, int hem, rpt 5 yrs [name])  ? CT HEAD LIMITED W/CM  1997  ? WNL  ? ESOPHAGOGASTRODUODENOSCOPY  1998  ? gastric polyps, benign  ? JOINT REPLACEMENT  1968  ? Left Knee, cartilage removed  ? KNEE ARTHROSCOPY  1968  ? cartilage removal  ? LUMBAR EPIDURAL INJECTION  02/2017  ? L3-4 interlaminar injection Mina Marble)  ? MRI lumbar  2000  ? mild bulge L3/4, mild foraminal narrowing L4/5, L5/S2, small disk herniation  ? MRI lumbar  11/2014  ? severe L3/4, L4/5 central canal stenosis (Pesach Frisch)  ? SHOULDER SURGERY Right 05/2019  ? Tamera Punt  ? TOTAL HIP ARTHROPLASTY Right 03/27/2013  ?  TOTAL HIP ARTHROPLASTY ANTERIOR APPROACH;  Surgeon: Hessie Dibble, MD  ? TOTAL KNEE ARTHROPLASTY Left 09/26/2012  ? TOTAL KNEE ARTHROPLASTY;  Surgeon: Hessie Dibble, MD  ? ?Social History  ? ?Socioeconomic History  ? Marital status: Married  ?  Spouse name: Mikeal Winstanley  ? Number of children: 4  ? Years of education: college  ? Highest education level: Not on file  ?Occupational History  ? Occupation: Database administrator in [employer]  ?Tobacco Use  ? Smoking status: Never  ? Smokeless tobacco: Never  ?Vaping Use  ? Vaping Use: Never used  ?Substance and Sexual Activity  ? Alcohol use: No  ?  Comment: Rare  ? Drug use: No  ? Sexual activity: Never  ?Other Topics Concern  ? Not on file  ?Social History Narrative  ? Lives with wife, has grown son  ? Activity: no regular exercise-presently limited by left knee pain for 1 prior surgery as well as significant lumbar spinal pain/stenosis with significant radiculopathy down bilateral legs.  Prior to this was ambulatory without any issues.  ? Diet: good water, some fish, good vegetables, red meat 3x/wk  ? Right-handed.  ? Two 32-ounce glasses of tea.  ? ?Social Determinants of Health  ? ?Financial Resource Strain:  Low Risk   ? Difficulty of Paying Living Expenses: Not hard at all  ?Food Insecurity: No Food Insecurity  ? Worried About Charity fundraiser in the Last Year: Never true  ? Ran Out of Food in the Last Year: Never true  ?Transportation Needs: No Transportation Needs  ? Lack of Transportation (Medical): No  ? Lack of Transportation (Non-Medical): No  ?Physical Activity: Insufficiently Active  ? Days of Exercise per Week: 2 days  ? Minutes of Exercise per Session: 30 min  ?Stress: No Stress Concern Present  ? Feeling of Stress : Only a little  ?Social Connections: Moderately Integrated  ? Frequency of Communication with Friends and Family: More than three times a week  ? Frequency of Social Gatherings with Friends and Family: More  than three times a week  ? Attends Religious Services: 1 to 4 times per year  ? Active Member of Clubs or Organizations: No  ? Attends Archivist Meetings: Never  ? Marital Status: Married  ? ?Family History  ?Problem Relation Age of Onset  ? Asthma Mother   ? Cancer Mother   ?     Vaginal; radiation dz of bowel  ? Stroke Father 62  ? Diabetes Father   ? Obesity Sister   ? Esophageal cancer Neg Hx   ? Stomach cancer Neg Hx   ? Rectal cancer Neg Hx   ? Colon cancer Neg Hx   ? Heart attack Neg Hx   ? CAD Neg Hx   ? ?Allergies  ?Allergen Reactions  ? Omeprazole   ?  Vomiting/diarrhea on BID dosing  ? ?Prior to Admission medications   ?Medication Sig Start Date End Date Taking? Authorizing Provider  ?acetaminophen (TYLENOL 8 HOUR) 650 MG CR tablet Take 2 tablets (1,300 mg total) by mouth 2 (two) times daily as needed for pain. ?Patient not taking: Reported on 12/07/2021 03/16/21  Yes [name], MD  ?acetaminophen (TYLENOL) 500 MG tablet Take 500 mg by mouth every 6 (six) hours as needed.   Yes [provider]  ?allopurinol (ZYLOPRIM) 300 MG tablet Take 1 tablet (300 mg total) by mouth daily. 09/16/21  Yes [name], MD  ?gabapentin (NEURONTIN) 300 MG capsule Take 300 mg by mouth at bedtime as needed (Nerve pan). 05/14/21  Yes [provider]  ?lisinopril (ZESTRIL) 5 MG tablet Take 1 tablet (5 mg total) by mouth daily. 09/16/21  Yes [name], MD  ?Multiple Vitamins-Minerals (CENTRUM SILVER 50+MEN PO) Take 1 tablet by mouth daily.   Yes [provider]  ?Omega-3 Fatty Acids (FISH OIL) 1200 MG CAPS Take 2 capsules (2,400 mg total) by mouth daily. 09/11/19  Yes [name], MD  ?OVER THE COUNTER MEDICATION Place 1 spray into both nostrils at bedtime as needed (Decongestant). Nasal decongestant ?Oxymetazoline 0.5%   Yes [provider]  ?Respiratory Therapy Supplies (CARETOUCH CPAP & BIPAP HOSE) MISC by Does not apply route.   Yes [provider]   ? ? ? ?All other systems have been reviewed and were otherwise negative with the exception of those mentioned in the HPI and as above. ? ?Physical Exam: ?Vitals:  ? 12/09/21 0609  ?BP: 132/68  ?Pulse: 71  ?Resp: 18  ?Temp: 98.3 ?F (36.8 ?C)  ?SpO2: 95%  ? ? ?Body mass index is 31 kg/m?. ? ?General: Alert, no acute distress ?Cardiovascular: No pedal edema ?Respiratory: No cyanosis, no use of accessory musculature ?Skin: No lesions in the area of chief complaint ?Neurologic:  Sensation intact distally ?Psychiatric: Patient is competent for consent with normal mood and affect ?Lymphatic: No axillary or cervical lymphadenopathy ? ? ?Assessment/Plan: ?Neurogenic claudication, severe spinal stenosis noted at L3-4, with moderate stenosis at L4-5, with a spondylolisthesis. ?Plan for Procedure(s): ?LEFT-SIDED LUMBAR 3- LUMBAR 4, LUMBAR 4- LUMBAR 5 TRANSFORAMINAL INTERBODY FUSION AND DECOMPRESSION WITH INSTRUMENTATION AND ALLOGRAFT ? ? ?Norva Karvonen, MD ?12/09/2021 ?6:42 AM  ?

## 2021-12-09 NOTE — Anesthesia Procedure Notes (Addendum)
Procedure Name: Intubation ?Date/Time: 12/09/2021 7:55 AM ?Performed by: Michele Rockers, CRNA ?Pre-anesthesia Checklist: Patient identified, Patient being monitored, Timeout performed, Emergency Drugs available and Suction available ?Patient Re-evaluated:Patient Re-evaluated prior to induction ?Oxygen Delivery Method: Circle system utilized ?Preoxygenation: Pre-oxygenation with 100% oxygen ?Induction Type: IV induction ?Ventilation: Mask ventilation without difficulty ?Laryngoscope Size: Mac, Glidescope and 4 ?Grade View: Grade I ?Tube type: Oral ?Tube size: 7.5 mm ?Number of attempts: 2 ?Airway Equipment and Method: Stylet ?Placement Confirmation: ETT inserted through vocal cords under direct vision, positive ETCO2 and breath sounds checked- equal and bilateral ?Secured at: 23 cm ?Tube secured with: Tape ?Dental Injury: Teeth and Oropharynx as per pre-operative assessment  ?Difficulty Due To: Difficult Airway- due to anterior larynx ?Comments: Unable to view vc with Mill 2. Glide Mac 4 used. ? ? ? ? ?

## 2021-12-09 NOTE — Plan of Care (Signed)
?  Problem: Education: Goal: Ability to verbalize activity precautions or restrictions will improve Outcome: Completed/Met Goal: Knowledge of the prescribed therapeutic regimen will improve Outcome: Completed/Met Goal: Understanding of discharge needs will improve Outcome: Completed/Met   Problem: Activity: Goal: Ability to avoid complications of mobility impairment will improve Outcome: Completed/Met Goal: Ability to tolerate increased activity will improve Outcome: Completed/Met Goal: Will remain free from falls Outcome: Completed/Met   Problem: Bowel/Gastric: Goal: Gastrointestinal status for postoperative course will improve Outcome: Completed/Met   Problem: Clinical Measurements: Goal: Ability to maintain clinical measurements within normal limits will improve Outcome: Completed/Met Goal: Postoperative complications will be avoided or minimized Outcome: Completed/Met Goal: Diagnostic test results will improve Outcome: Completed/Met   Problem: Pain Management: Goal: Pain level will decrease Outcome: Completed/Met   Problem: Skin Integrity: Goal: Will show signs of wound healing Outcome: Completed/Met   Problem: Health Behavior/Discharge Planning: Goal: Identification of resources available to assist in meeting health care needs will improve Outcome: Completed/Met   Problem: Bladder/Genitourinary: Goal: Urinary functional status for postoperative course will improve Outcome: Completed/Met   Problem: Safety: Goal: Ability to remain free from injury will improve Outcome: Completed/Met   

## 2021-12-09 NOTE — Op Note (Signed)
PATIENT NAME: Ronald Bullock.  ? ?MEDICAL RECORD NO.:   [id number]  ?  ?DATE OF BIRTH: 22-Jan-1951  ?  ?DATE OF PROCEDURE: 12/09/2021  ? ?  ?                            OPERATIVE REPORT ?  ?  ?PREOPERATIVE DIAGNOSES: ?1. Severe spinal stenosis L3-4, L4-5 (T02.409) ?2. Bilateral lumbar radiculopathy (M54.16) ?3. L3-4, L4-5 spondylolisthesis (M53.2X6) ?  ?POSTOPERATIVE DIAGNOSES: ?1. Severe spinal stenosis L3-4, L4-5 (B35.329) ?2. Bilateral lumbar radiculopathy (M54.16) ?3. L3-4, L4-5 spondylolisthesis (M53.2X6) ?  ?PROCEDURES: ?1. Lumbar decompression, L3-4, L4-5, including bilateral partial facetectomy, and bilateral lumbar decompression ?2. Left-sided L3-4, L4-5 transforaminal lumbar interbody fusion. ?3. Right-sided L3-4, L4-5 posterolateral fusion. ?4. Insertion of interbody device x 2 (Globus expandable intervertebral spacers). ?5. Placement of segmental posterior instrumentation L3, L4, L5, bilaterally. ?6. Use of local autograft. ?7. Use of morselized allograft - ViviGen. ?8. Intraoperative use of fluoroscopy. ?  ?SURGEON:  Phylliss Bob, MD. ?  ?ASSISTANT:  Pricilla Holm, PA-C. ?  ?ANESTHESIA:  General endotracheal anesthesia. ?  ?COMPLICATIONS:  None. ?  ?DISPOSITION:  Stable. ?  ?ESTIMATED BLOOD LOSS:  200cc ?  ?INDICATIONS FOR SURGERY:  Briefly, Mr. Drees is a pleasant 71 y.o. -year-old male  who did present to me with severe and ongoing pain in the bilateral legs. I did feel that the symptoms were secondary to the findings noted above. The patient failed conservative care and did wish to proceed with the procedure noted above.  ?  ?OPERATIVE DETAILS:  On 12/09/2021, the patient was brought to ?surgery and general endotracheal anesthesia was administered.  The ?patient was placed prone on a well-padded flat Jackson bed with a spinal ?frame.  Antibiotics were given and a time-out procedure was performed. ?The back was prepped and draped in the usual fashion.  A midline ?incision was made overlying  the L3-4 and L4-5 intervertebral spaces.  The fascia was incised at the midline.  The paraspinal musculature was bluntly ?swept laterally.  Anatomic landmarks for the pedicles were exposed. ?Using fluoroscopy, I did cannulate the L3, L4, and L5 pedicles bilaterally, ?using a medial to lateral cortical trajectory technique.  On the right ?side, the posterolateral gutter and facet joints at L3-4 and L4-5 were ?decorticated and 6 mm screws of the appropriate length were placed at L3, L4, and L5 pedicles and a 75-mm rod was placed ?and distraction was applied across the rod at each intervertebral level.  On the ?left side, the cannulated pedicle holes were filled with bone wax.  I ?then proceeded with the decompressive aspect of the procedure.     ?Starting at L4-5, I did perform a laminotomy and a full facetectomy on the left.  I was able to thoroughly and entirely decompress the L4-5 intervertebral space bilaterally, removing facet hypertrophy and ligamentum flavum hypertrophy.  At this point, with an assistant holding medial retraction of the traversing left L5 nerve, I did perform a thorough and complete L4-5 intervertebral discectomy.  The intervertebral space was then liberally packed with autograft from the decompression, as well as allograft in the form of ViviGen, as was the appropriately sized intervertebral spacer.  The spacer was then expanded to approximately 14.5 mm in height.  Distraction was then released on the contralateral right side.  I then turned my attention to the L3-4 level.  Once again, it was clearly evident that there was stenosis at the  L3-4 level.  The stenosis was thoroughly and adequately decompressed by performing a bilateral partial facetectomy.  With an assistant holding medial retraction of the traversing left L4 nerve, I did perform an annulotomy at the ?posterolateral aspect of the L3-4 intervertebral space.  I then used a ?series of curettes and pituitary rongeurs to perform a  thorough and ?complete intervertebral diskectomy.  The intervertebral space was then ?liberally packed with autograft as well as allograft in the form of ?ViviGen, as was the appropriate-sized intervertebral spacer.  The spacer ?was then tamped into position in the usual fashion, and expanded to approximately 13.4 mm in height. I was very pleased ?with the press-fit of the spacer.  I then placed 6 mm screws on the ?left at L3, L4, and L5.  A 75-mm rod was then placed and caps were placed. The distraction was then released on the contralateral right side.  All ?6 caps were then locked.  The wound was copiously irrigated with a total ?of approximately 3 L prior to placing the bone graft.  Additional ?autograft and allograft were then packed into the posterolateral gutter ?on the right side to help aid in the success of the fusion.  The wound was  ?explored for any undue bleeding and there was no substantial bleeding encountered.  Gel-Foam was placed over ?the laminectomy site.  The wound was then closed in layers using #1 ?Vicryl followed by 2-0 Vicryl, followed by 4-0 Monocryl.  Benzoin and ?Steri-Strips were applied followed by sterile dressing.   ?  ?  ?Of note, Pricilla Holm was my assistant throughout surgery, and did aid ?in retraction, suctioning, placement of the hardware, and closure. ?  ?  ?Phylliss Bob, MD  ?

## 2021-12-09 NOTE — Anesthesia Postprocedure Evaluation (Signed)
Anesthesia Post Note ? ?Patient: Ronald Bullock. ? ?Procedure(s) Performed: LEFT-SIDED LUMBAR 3- LUMBAR 4, LUMBAR 4- LUMBAR 5 TRANSFORAMINAL INTERBODY FUSION AND DECOMPRESSION WITH INSTRUMENTATION AND ALLOGRAFT (Left) ? ?  ? ?Patient location during evaluation: PACU ?Anesthesia Type: General ?Level of consciousness: awake and alert and oriented ?Pain management: pain level controlled ?Vital Signs Assessment: post-procedure vital signs reviewed and stable ?Respiratory status: spontaneous breathing, nonlabored ventilation and respiratory function stable ?Cardiovascular status: blood pressure returned to baseline and stable ?Postop Assessment: no apparent nausea or vomiting ?Anesthetic complications: no ? ? ?No notable events documented. ? ?Last Vitals:  ?Vitals:  ? 12/09/21 1250 12/09/21 1305  ?BP: (!) 120/55 (!) 110/56  ?Pulse: 74 73  ?Resp: 15 15  ?Temp:    ?SpO2: 96% 96%  ?  ?Last Pain:  ?Vitals:  ? 12/09/21 1305  ?TempSrc:   ?PainSc: 0-No pain  ? ? ?LLE Motor Response: Purposeful movement (12/09/21 1305) ?LLE Sensation: Full sensation (12/09/21 1305) ?RLE Motor Response: Purposeful movement (12/09/21 1305) ?RLE Sensation: Full sensation (12/09/21 1305) ?  ?  ? ?Lasonia Casino A. ? ? ? ? ?

## 2021-12-09 NOTE — Transfer of Care (Signed)
Immediate Anesthesia Transfer of Care Note ? ?Patient: Ronald Bullock. ? ?Procedure(s) Performed: LEFT-SIDED LUMBAR 3- LUMBAR 4, LUMBAR 4- LUMBAR 5 TRANSFORAMINAL INTERBODY FUSION AND DECOMPRESSION WITH INSTRUMENTATION AND ALLOGRAFT (Left) ? ?Patient Location: PACU ? ?Anesthesia Type:General ? ?Level of Consciousness: awake, alert , oriented and patient cooperative ? ?Airway & Oxygen Therapy: Patient Spontanous Breathing and Patient connected to nasal cannula oxygen ? ?Post-op Assessment: Report given to RN, Post -op Vital signs reviewed and stable and Patient moving all extremities X 4 ? ?Post vital signs: Reviewed and stable ? ?Last Vitals:  ?Vitals Value Taken Time  ?BP 150/69 12/09/21 1236  ?Temp    ?Pulse 83 12/09/21 1237  ?Resp 15 12/09/21 1237  ?SpO2 99 % 12/09/21 1237  ?Vitals shown include unvalidated device data. ? ?Last Pain:  ?Vitals:  ? 12/09/21 0641  ?TempSrc:   ?PainSc: 0-No pain  ?   ? ?  ? ?Complications: No notable events documented. ?

## 2021-12-10 DIAGNOSIS — I10 Essential (primary) hypertension: Secondary | ICD-10-CM | POA: Diagnosis not present

## 2021-12-10 DIAGNOSIS — M5416 Radiculopathy, lumbar region: Secondary | ICD-10-CM | POA: Diagnosis not present

## 2021-12-10 DIAGNOSIS — Z96641 Presence of right artificial hip joint: Secondary | ICD-10-CM | POA: Diagnosis not present

## 2021-12-10 DIAGNOSIS — M4316 Spondylolisthesis, lumbar region: Secondary | ICD-10-CM | POA: Diagnosis not present

## 2021-12-10 DIAGNOSIS — Z96653 Presence of artificial knee joint, bilateral: Secondary | ICD-10-CM | POA: Diagnosis not present

## 2021-12-10 DIAGNOSIS — M48061 Spinal stenosis, lumbar region without neurogenic claudication: Secondary | ICD-10-CM | POA: Diagnosis not present

## 2021-12-10 MED ORDER — OXYCODONE-ACETAMINOPHEN 5-325 MG PO TABS: 1.0000 | ORAL_TABLET | ORAL | 0 refills | Status: DC | PRN

## 2021-12-10 MED ORDER — METHOCARBAMOL 500 MG PO TABS: 500.0000 mg | ORAL_TABLET | Freq: Four times a day (QID) | ORAL | 2 refills | Status: DC | PRN

## 2021-12-10 MED FILL — Heparin Sodium (Porcine) Inj 1000 Unit/ML: INTRAMUSCULAR | Qty: 30 | Status: AC

## 2021-12-10 MED FILL — Sodium Chloride IV Soln 0.9%: INTRAVENOUS | Qty: 1000 | Status: AC

## 2021-12-10 MED FILL — Thrombin For Soln Kit 20000 Unit: CUTANEOUS | Qty: 1 | Status: AC

## 2021-12-10 NOTE — Evaluation (Signed)
Occupational Therapy Evaluation and Discharge ?Patient Details ?Name: Ronald Bullock. ?MRN: [id number] ?DOB: March 31, 1951 ?Today's Date: 12/10/2021 ? ? ?History of Present Illness This 71 yo male is s/p left-sided L3-4, L4-5 transforaminal lumbar interbody fusion and right-sided L3-4, L4-5 posterolateral fusion.  ? ?Clinical Impression ?  ?This 71 yo male admitted and underwent above presents to acute OT with all education completed with pt and wife, no further OT needs, we will sign off.  ?   ? ?Recommendations for follow up therapy are one component of a multi-disciplinary discharge planning process, led by the attending physician.  Recommendations may be updated based on patient status, additional functional criteria and insurance authorization.  ? ?Follow Up Recommendations ? No OT follow up  ?  ?Assistance Recommended at Discharge PRN  ?Patient can return home with the following Assistance with cooking/housework ? ?  ?Functional Status Assessment ? Patient has had a recent decline in their functional status and demonstrates the ability to make significant improvements in function in a reasonable and predictable amount of time. (without further needed skilled OT, all education completed)  ?Equipment Recommendations ? None recommended by OT  ?  ?   ?Precautions / Restrictions Precautions ?Precautions: Back ?Precaution Booklet Issued: Yes (comment) ?Required Braces or Orthoses: Spinal Brace ?Spinal Brace: Thoracolumbosacral orthotic;Applied in sitting position ?Restrictions ?Weight Bearing Restrictions: No  ? ?  ? ?Mobility Bed Mobility ?Overal bed mobility: Modified Independent ?  ?  ?  ?  ?  ?  ?  ?  ? ?Transfers ?Overall transfer level: Needs assistance ?Equipment used: None ?Transfers: Sit to/from Stand ?Sit to Stand: Supervision ?  ?  ?  ?  ?  ?  ?  ? ?  ?Balance Overall balance assessment: Mild deficits observed, not formally tested ?  ?  ?  ?  ?  ?  ?  ?  ?  ?  ?  ?  ?  ?  ?  ?  ?  ?  ?   ? ?ADL either  performed or assessed with clinical judgement  ? ?ADL Overall ADL's : Needs assistance/impaired ?Eating/Feeding: Independent;Sitting ?  ?Grooming: Set up;Standing ?  ?Upper Body Bathing: Set up;Sitting ?  ?Lower Body Bathing: Minimal assistance ?Lower Body Bathing Details (indicate cue type and reason): S  sit<>stand ?Upper Body Dressing : Set up;Sitting ?  ?Lower Body Dressing: Set up;Adhering to back precautions;With adaptive equipment ?Lower Body Dressing Details (indicate cue type and reason): S sit<>stand;; reacher and sock aid ?Toilet Transfer: Supervision/safety;Ambulation ?Toilet Transfer Details (indicate cue type and reason): No AD, simulated bed>recliner on other side of bed ?Toileting- Clothing Manipulation and Hygiene: Supervision/safety;Sit to/from stand ?Toileting - Clothing Manipulation Details (indicate cue type and reason): Educated on wet wipes ?  ?  ?  ?   ? ? ? ?Vision Patient Visual Report: No change from baseline ?   ?   ?   ?   ? ?Pertinent Vitals/Pain Pain Assessment ?Pain Assessment: Faces ?Faces Pain Scale: Hurts a little bit ?Pain Location: incisional ?Pain Descriptors / Indicators: Aching, Sore ?Pain Intervention(s): Monitored during session, Limited activity within patient's tolerance  ? ? ? ?Hand Dominance  right ?  ?Extremity/Trunk Assessment Upper Extremity Assessment ?Upper Extremity Assessment: Overall WFL for tasks assessed ?  ?  ?  ?  ?  ?Communication Communication ?Communication: No difficulties ?  ?Cognition Arousal/Alertness: Awake/alert ?Behavior During Therapy: [facility] for tasks assessed/performed ?Overall Cognitive Status: Within Functional Limits for tasks assessed ?  ?  ?  ?  ?  ?  ?  ?  ?  ?  ?  ?  ?  ?  ?  ?  ?  ?  ?  ?   ?   ?   ? ? ?  Home Living Family/patient expects to be discharged to:: Private residence ?Living Arrangements: Spouse/significant other ?Available Help at Discharge: Family;Available 24 hours/day ?Type of Home: House ?Home Access: Stairs to enter ?Entrance  Stairs-Number of Steps: 2 ?Entrance Stairs-Rails: Right ?Home Layout:  (has one step down into bedroom) ?  ?  ?Bathroom Shower/Tub: Walk-in shower ?  ?Bathroom Toilet: Standard ?Bathroom Accessibility: Yes ?How Accessible: Accessible via walker ?Home Equipment: BSC/3in1;Rolling Walker (2 wheels);Cane - single point;Adaptive equipment ?Adaptive Equipment: Reacher;Long-handled sponge;Long-handled shoe horn ?  ?  ? ?  ?Prior Functioning/Environment Prior Level of Function : Independent/Modified Independent ?  ?  ?  ?  ?  ?  ?  ?  ?  ? ?  ?  ?OT Problem List: Decreased range of motion;Pain ?  ?   ?   ?OT Goals(Current goals can be found in the care plan section) Acute Rehab OT Goals ?Patient Stated Goal: go home today  ?   ? ?   ?AM-PAC OT "6 Clicks" Daily Activity     ?Outcome Measure Help from another person eating meals?: None ?Help from another person taking care of personal grooming?: None ?Help from another person toileting, which includes using toliet, bedpan, or urinal?: None ?Help from another person bathing (including washing, rinsing, drying)?: None ?Help from another person to put on and taking off regular upper body clothing?: None ?Help from another person to put on and taking off regular lower body clothing?: None ?6 Click Score: 24 ?  ?End of Session Equipment Utilized During Treatment: Back brace ? ?Activity Tolerance: Patient tolerated treatment well ?Patient left: in chair;with family/visitor present ? ?OT Visit Diagnosis: Unsteadiness on feet (R26.81);Pain ?Pain - part of body:  (incisional)  ?              ?Time: 9702-6378 ?OT Time Calculation (min): 34 min ?Charges:  OT General Charges ?$OT Visit: 1 Visit ?OT Evaluation ?$OT Eval Moderate Complexity: 1 Mod ?OT Treatments ?$Self Care/Home Management : 8-22 mins ? ?[place]en Circle, OTR/L ?Acute Rehab Services ?Pager [phone] ?Office [phone] ? ? ? ?Almon Register ?12/10/2021, 9:54 AM ?

## 2021-12-10 NOTE — Progress Notes (Signed)
Patient alert and oriented, mae's well, voiding adequate amount of urine, swallowing without difficulty, no c/o pain at time of discharge. Patient discharged home with family. Script and discharged instructions given to patient. Patient and family stated understanding of instructions given. Patient has an appointment with Dr. Dumonski in 2 weeks 

## 2021-12-10 NOTE — Evaluation (Signed)
Physical Therapy Evaluation ?Patient Details ?Name: Ronald Bullock. ?MRN: [id number] ?DOB: 16-Feb-1951 ?Today's Date: 12/10/2021 ? ?History of Present Illness ? This 71 yo male is s/p left-sided L3-4, L4-5 transforaminal lumbar interbody fusion and right-sided L3-4, L4-5 posterolateral fusion. PMH includes HTN, OSA on CPAP, and gout.  ?Clinical Impression ? Pt presents s/p lumbar fusion. Pt required supervision for transfers, min guard for ambulation and stair navigation without AD. Pt Pt educated on car transfers, walking program, and positioning. If balance deficits persist, pt may want to consider a SPC for support. All education completed. PT recommending outpatient PT once cleared from post op protocol; MD to order. No further acute PT needs at this time. PT will sign off at this time. If changes occur, please reconsult. ?  ?   ?   ? ?Recommendations for follow up therapy are one component of a multi-disciplinary discharge planning process, led by the attending physician.  Recommendations may be updated based on patient status, additional functional criteria and insurance authorization. ? ?Follow Up Recommendations Outpatient PT (when appropriate, per post op protocol; MD to order.) ? ?  ?Assistance Recommended at Discharge PRN  ?Patient can return home with the following ? Assistance with cooking/housework;Assist for transportation;Help with stairs or ramp for entrance;A little help with walking and/or transfers ? ?  ?Equipment Recommendations None recommended by PT  ?Recommendations for Other Services ?    ?  ?Functional Status Assessment Patient has had a recent decline in their functional status and demonstrates the ability to make significant improvements in function in a reasonable and predictable amount of time.  ? ?  ?Precautions / Restrictions Precautions ?Precautions: Back ?Precaution Booklet Issued: Yes (comment) ?Required Braces or Orthoses: Spinal Brace ?Spinal Brace: Thoracolumbosacral  orthotic;Other (comment) (already applied upon arrival) ?Restrictions ?Weight Bearing Restrictions: No  ? ?  ? ?Mobility ? Bed Mobility ?  ?  ?  ?  ?  ?  ?  ?General bed mobility comments: in recliner upon arrival ?  ? ?Transfers ?Overall transfer level: Needs assistance ?Equipment used: None ?Transfers: Sit to/from Stand ?Sit to Stand: Supervision ?  ?  ?  ?  ?  ?General transfer comment: pt was supervision for safety and required increased time to perform ?  ? ?Ambulation/Gait ?Ambulation/Gait assistance: Min guard ?Gait Distance (Feet): 200 Feet ?Assistive device: None ?Gait Pattern/deviations: Step-through pattern, Decreased stride length ?Gait velocity: decreased ?Gait velocity interpretation: >2.62 ft/sec, indicative of community ambulatory ?  ?General Gait Details: required min guard A for safety and steadying. Pt had one LOB with horizontal head turn. Pt self corrected. Pt stays close to the wall and occassionally reaches out for bed rail in hallway ? ?Stairs ?Stairs: Yes ?Stairs assistance: Min guard ?Stair Management: One rail Right, Step to pattern, Forwards ?Number of Stairs: 2 ?General stair comments: pt required min guard A for steadying during stair navigation. SPT demonstrated beforehand and pt demonstrated understanding during mobility task. Pt feels comfortable navigating his stairs at home ? ?Wheelchair Mobility ?  ? ?Modified Rankin (Stroke Patients Only) ?  ? ?  ? ?Balance Overall balance assessment: Needs assistance ?Sitting-balance support: Feet supported ?Sitting balance-Leahy Scale: Good ?  ?  ?Standing balance support: No upper extremity supported ?Standing balance-Leahy Scale: Fair ?Standing balance comment: pt had one LOB during ambulation, able to self correct. ?  ?  ?  ?  ?  ?  ?  ?  ?  ?  ?  ?   ? ? ? ?Pertinent  Vitals/Pain Pain Assessment ?Pain Assessment: 0-10 ?Pain Score: 3  ?Pain Location: incisional ?Pain Descriptors / Indicators: Aching, Sore ?Pain Intervention(s): Monitored  during session  ? ? ?Home Living Family/patient expects to be discharged to:: Private residence ?Living Arrangements: Spouse/significant other ?Available Help at Discharge: Family;Available 24 hours/day ?Type of Home: House ?Home Access: Stairs to enter ?Entrance Stairs-Rails: Right ?Entrance Stairs-Number of Steps: 2 ?  ?Home Layout: One level;Other (Comment) (one step to get into bedroom) ?Home Equipment: BSC/3in1;Rolling Walker (2 wheels);Cane - single point;Adaptive equipment ?   ?  ?Prior Function Prior Level of Function : Independent/Modified Independent ?  ?  ?  ?  ?  ?  ?Mobility Comments: walks without AD at baseline ?ADLs Comments: bathes, dresses, and feeds himself ?  ? ? ?Hand Dominance  ? Dominant Hand: Right ? ?  ?Extremity/Trunk Assessment  ? Upper Extremity Assessment ?Upper Extremity Assessment: Defer to OT evaluation ?  ? ?Lower Extremity Assessment ?Lower Extremity Assessment: Generalized weakness ?  ? ?Cervical / Trunk Assessment ?Cervical / Trunk Assessment: Normal  ?Communication  ? Communication: No difficulties  ?Cognition Arousal/Alertness: Awake/alert ?Behavior During Therapy: [facility] for tasks assessed/performed ?Overall Cognitive Status: Within Functional Limits for tasks assessed ?  ?  ?  ?  ?  ?  ?  ?  ?  ?  ?  ?  ?  ?  ?  ?  ?  ?  ?  ? ?  ?General Comments General comments (skin integrity, edema, etc.): educated on positioning, car transfers, and walking program ? ?  ?Exercises    ? ?Assessment/Plan  ?  ?PT Assessment Patient does not need any further PT services  ?PT Problem List Decreased strength;Decreased range of motion;Decreased balance ? ?   ?  ?PT Treatment Interventions     ? ?PT Goals (Current goals can be found in the Care Plan section)  ?Acute Rehab PT Goals ?Patient Stated Goal: to return home ?PT Goal Formulation: With patient/family ?Time For Goal Achievement: 12/10/21 ?Potential to Achieve Goals: Good ? ?  ?Frequency   ?  ? ? ?Co-evaluation   ?  ?  ?  ?  ? ? ?  ?AM-PAC PT  "6 Clicks" Mobility  ?Outcome Measure Help needed turning from your back to your side while in a flat bed without using bedrails?: None ?Help needed moving from lying on your back to sitting on the side of a flat bed without using bedrails?: None ?Help needed moving to and from a bed to a chair (including a wheelchair)?: A Little ?Help needed standing up from a chair using your arms (e.g., wheelchair or bedside chair)?: A Little ?Help needed to walk in hospital room?: A Little ?Help needed climbing 3-5 steps with a railing? : A Little ?6 Click Score: 20 ? ?  ?End of Session Equipment Utilized During Treatment: Gait belt;Back brace ?Activity Tolerance: Patient tolerated treatment well ?Patient left: in chair;with call bell/phone within reach;with family/visitor present ?Nurse Communication: Mobility status ?PT Visit Diagnosis: Unsteadiness on feet (R26.81) ?  ? ?Time: 0093-8182 ?PT Time Calculation (min) (ACUTE ONLY): 18 min ? ? ?Charges:   PT Evaluation ?$PT Eval Low Complexity: 1 Low ?  ?  ?   ? ? ?Jonne Ply, SPT ? ?Jonne Ply ?12/10/2021, 12:52 PM ? ?

## 2021-12-10 NOTE — Progress Notes (Signed)
? ? ?  Patient doing well  ?Patient doing well, denies leg pain ?Has been ambulating ? ? ?Physical Exam: ?Vitals:  ? 12/09/21 2309 12/10/21 0326  ?BP: 127/66 112/69  ?Pulse: 64 63  ?Resp: 18 20  ?Temp: 98.4 ?F (36.9 ?C) 98.2 ?F (36.8 ?C)  ?SpO2: 98% 99%  ? ? ?Dressing in place ?NVI ? ?POD #1 s/p L3-L5 decompression and fusion, doing well ? ?- up with PT/OT, encourage ambulation ?- Percocet for pain, Robaxin for muscle spasms ?- d/c home today with f/u in 2 weeks  ?

## 2021-12-11 DIAGNOSIS — M109 Gout, unspecified: Secondary | ICD-10-CM | POA: Diagnosis not present

## 2021-12-11 DIAGNOSIS — M5030 Other cervical disc degeneration, unspecified cervical region: Secondary | ICD-10-CM | POA: Diagnosis not present

## 2021-12-11 DIAGNOSIS — R739 Hyperglycemia, unspecified: Secondary | ICD-10-CM | POA: Diagnosis not present

## 2021-12-11 DIAGNOSIS — Z4789 Encounter for other orthopedic aftercare: Secondary | ICD-10-CM | POA: Diagnosis not present

## 2021-12-11 DIAGNOSIS — E785 Hyperlipidemia, unspecified: Secondary | ICD-10-CM | POA: Diagnosis not present

## 2021-12-11 DIAGNOSIS — I10 Essential (primary) hypertension: Secondary | ICD-10-CM | POA: Diagnosis not present

## 2021-12-11 DIAGNOSIS — Z9989 Dependence on other enabling machines and devices: Secondary | ICD-10-CM | POA: Diagnosis not present

## 2021-12-11 DIAGNOSIS — G4733 Obstructive sleep apnea (adult) (pediatric): Secondary | ICD-10-CM | POA: Diagnosis not present

## 2021-12-11 DIAGNOSIS — Z981 Arthrodesis status: Secondary | ICD-10-CM | POA: Diagnosis not present

## 2021-12-14 ENCOUNTER — Encounter (HOSPITAL_COMMUNITY): Payer: Self-pay | Admitting: Orthopedic Surgery

## 2021-12-15 DIAGNOSIS — Z981 Arthrodesis status: Secondary | ICD-10-CM | POA: Diagnosis not present

## 2021-12-15 DIAGNOSIS — I10 Essential (primary) hypertension: Secondary | ICD-10-CM | POA: Diagnosis not present

## 2021-12-15 DIAGNOSIS — M5030 Other cervical disc degeneration, unspecified cervical region: Secondary | ICD-10-CM | POA: Diagnosis not present

## 2021-12-15 DIAGNOSIS — Z4789 Encounter for other orthopedic aftercare: Secondary | ICD-10-CM | POA: Diagnosis not present

## 2021-12-15 DIAGNOSIS — G4733 Obstructive sleep apnea (adult) (pediatric): Secondary | ICD-10-CM | POA: Diagnosis not present

## 2021-12-15 DIAGNOSIS — R739 Hyperglycemia, unspecified: Secondary | ICD-10-CM | POA: Diagnosis not present

## 2021-12-17 DIAGNOSIS — Z981 Arthrodesis status: Secondary | ICD-10-CM | POA: Diagnosis not present

## 2021-12-17 DIAGNOSIS — G4733 Obstructive sleep apnea (adult) (pediatric): Secondary | ICD-10-CM | POA: Diagnosis not present

## 2021-12-17 DIAGNOSIS — M5030 Other cervical disc degeneration, unspecified cervical region: Secondary | ICD-10-CM | POA: Diagnosis not present

## 2021-12-17 DIAGNOSIS — Z4789 Encounter for other orthopedic aftercare: Secondary | ICD-10-CM | POA: Diagnosis not present

## 2021-12-17 DIAGNOSIS — R739 Hyperglycemia, unspecified: Secondary | ICD-10-CM | POA: Diagnosis not present

## 2021-12-17 DIAGNOSIS — I10 Essential (primary) hypertension: Secondary | ICD-10-CM | POA: Diagnosis not present

## 2021-12-17 NOTE — Discharge Summary (Signed)
? ? ?Patient ID: ?Ronald Bullock. ?MRN: [id number] ?DOB/AGE: [birth year] 71 y.o. ? ?Admit date: 12/09/2021 ?Discharge date: 12/10/2021 ? ?Admission Diagnoses:  ?Principal Problem: ?  Radiculopathy ? ? ?Discharge Diagnoses:  ?Same ? ?Past Medical History:  ?Diagnosis Date  ? Arthritis   ? L end stage knee DKD, mild R knee DJD, R hip end stage DJD  ? DDD (degenerative disc disease), cervical   ? multilevel  ? Gout   ? rare flares  ? HLD (hyperlipidemia)   ? Low HDL  ? Hyperglycemia   ? Hypertension   ? Lumbar spinal stenosis 11/2014  ? severe central canal stenosis L3/4;L4/5 bilat facet hypertrophy, annular disc bulging, grade 1 sphondylolisthesis (Dumonski) planned PT and ESI Mina Marble)  ? OSA on CPAP   ? Osteoarthritis   ? R knee and hip, s/p L TKR  ? ? ?Surgeries: Procedure(s): ?LEFT-SIDED LUMBAR 3- LUMBAR 4, LUMBAR 4- LUMBAR 5 TRANSFORAMINAL INTERBODY FUSION AND DECOMPRESSION WITH INSTRUMENTATION AND ALLOGRAFT on 12/09/2021 ?  ?Consultants: None ? ?Discharged Condition: Improved ? ?Hospital Course: Ronald Bullock. is an 71 y.o. male who was admitted 12/09/2021 for operative treatment of Radiculopathy. Patient has severe unremitting pain that affects sleep, daily activities, and work/hobbies. After pre-op clearance the patient was taken to the operating room on 12/09/2021 and underwent  Procedure(s): ?LEFT-SIDED LUMBAR 3- LUMBAR 4, LUMBAR 4- LUMBAR 5 TRANSFORAMINAL INTERBODY FUSION AND DECOMPRESSION WITH INSTRUMENTATION AND ALLOGRAFT.   ? ?Patient was given perioperative antibiotics:  ?Anti-infectives (From admission, onward)  ? ? Start     Dose/Rate Route Frequency Ordered Stop  ? 12/09/21 2000  ceFAZolin (ANCEF) IVPB 2g/100 mL premix       ? 2 g ?200 mL/hr over 30 Minutes Intravenous Every 8 hours 12/09/21 1328 12/10/21 0336  ? 12/09/21 0600  ceFAZolin (ANCEF) IVPB 2g/100 mL premix       ? 2 g ?200 mL/hr over 30 Minutes Intravenous On call to O.R. 12/09/21 0551 12/09/21 1220  ? ?  ?  ? ?Patient was given  sequential compression devices, early ambulation to prevent DVT. ? ?Patient benefited maximally from hospital stay and there were no complications.   ? ?Recent vital signs: BP 121/63 (BP Location: Right Arm)   Pulse 67   Temp (!) 97.2 ?F (36.2 ?C) (Oral)   Resp 18   Ht 5' 11.75" (1.822 m)   Wt 103 kg   SpO2 98%   BMI 31.00 kg/m?   ? ?Discharge Medications:   ?Allergies as of 12/10/2021   ? ?   Reactions  ? Omeprazole   ? Vomiting/diarrhea on BID dosing  ? ?  ? ?  ?Medication List  ?  ? ?TAKE these medications   ? ?acetaminophen 500 MG tablet ?Commonly known as: TYLENOL ?Take 500 mg by mouth every 6 (six) hours as needed. ?  ?acetaminophen 650 MG CR tablet ?Commonly known as: Tylenol 8 Hour ?Take 2 tablets (1,300 mg total) by mouth 2 (two) times daily as needed for pain. ?  ?allopurinol 300 MG tablet ?Commonly known as: ZYLOPRIM ?Take 1 tablet (300 mg total) by mouth daily. ?  ?[place] ?by Does not apply route. ?  ?CENTRUM SILVER 50+MEN PO ?Take 1 tablet by mouth daily. ?  ?gabapentin 300 MG capsule ?Commonly known as: NEURONTIN ?Take 300 mg by mouth at bedtime as needed (Nerve pan). ?  ?lisinopril 5 MG tablet ?Commonly known as: ZESTRIL ?Take 1 tablet (5 mg total) by mouth daily. ?  ?  methocarbamol 500 MG tablet ?Commonly known as: ROBAXIN ?Take 1 tablet (500 mg total) by mouth every 6 (six) hours as needed for muscle spasms. ?  ?OVER THE COUNTER MEDICATION ?Place 1 spray into both nostrils at bedtime as needed (Decongestant). Nasal decongestant ?Oxymetazoline 0.5% ?  ?oxyCODONE-acetaminophen 5-325 MG tablet ?Commonly known as: PERCOCET/ROXICET ?Take 1-2 tablets by mouth every 4 (four) hours as needed for severe pain. ?  ? ?  ? ? ?Diagnostic Studies: DG Lumbar Spine 2-3 Views ? ?Result Date: 12/09/2021 ?CLINICAL DATA:  Fluoroscopic assistance for lumbar fusion EXAM: LUMBAR SPINE - 2 VIEW COMPARISON:  MR lumbar spine done on 01/03/2017 lateral lumbar spine radiograph done on 12/09/2021.  FINDINGS: Fluoroscopic images show surgical fusion at L3-L4 and L4-L5 levels. Intervertebral disc spaces are noted in place. Fluoroscopic time was 62 seconds. Radiation dose was 22.82 mGy. IMPRESSION: Fluoroscopic assistance was provided for lumbar fusion at L3-L4 and L4-L5 levels. Electronically Signed   By: Elmer Picker M.D.   On: 12/09/2021 12:46  ? ?DG Lumbar Spine 1 View ? ?Result Date: 12/09/2021 ?CLINICAL DATA:  LEFT L3-L5 TLIF EXAM: LUMBAR SPINE - 1 VIEW COMPARISON:  Portable exam 7793 hours compared MRI of 01/03/2017 FINDINGS: Prior MR labeled with 5 lumbar vertebra. Two metallic probes via dorsal approach project dorsal to the mid L4 level and the L5-S1 disc space level. Scattered endplate spur formation lumbar spine. Minimal anterolisthesis L3-L4 unchanged. IMPRESSION: Dorsal localization of the mid L4 level and the L5-S1 disc space level. Electronically Signed   By: Lavonia Dana M.D.   On: 12/09/2021 10:56  ? ?DG C-Arm 1-60 Min-No Report ? ?Result Date: 12/09/2021 ?Fluoroscopy was utilized by the requesting physician.  No radiographic interpretation.  ? ?DG C-Arm 1-60 Min-No Report ? ?Result Date: 12/09/2021 ?Fluoroscopy was utilized by the requesting physician.  No radiographic interpretation.  ? ?DG C-Arm 1-60 Min-No Report ? ?Result Date: 12/09/2021 ?Fluoroscopy was utilized by the requesting physician.  No radiographic interpretation.  ? ?DG C-Arm 1-60 Min-No Report ? ?Result Date: 12/09/2021 ?Fluoroscopy was utilized by the requesting physician.  No radiographic interpretation.  ? ?ECHOCARDIOGRAM COMPLETE ? ?Result Date: 12/08/2021 ?   ECHOCARDIOGRAM REPORT   Patient Name:   Ronald Bullock. Date of Exam: 12/08/2021 Medical Rec #:  [id number]               Height:       71.7 in Accession #:    [id number]              Weight:       227.2 lb Date of Birth:  [birth year]              BSA:          2.243 m? Patient Age:    71 years                BP:           114/62 mmHg Patient Gender: M                        HR:           66 bpm. Exam Location:  [address] Procedure: 2D Echo, 3D Echo, Cardiac Doppler, Color Doppler and Strain Analysis Indications:    R94.31 Abnormal EKG  History:        Patient has no prior history of Echocardiogram examinations.  Risk Factors:Hypertension and Dyslipidemia. Obstructive sleep                 apnea-CPAP.  Sonographer:    Cresenciano Lick RDCS Referring Phys: [place]on Report CC'd to: Glenetta Hew MD IMPRESSIONS  1. Left ventricular ejection fraction, by estimation, is 55 to 60%. Left ventricular ejection fraction by 3D volume is 56 %. The left ventricle has normal function. The left ventricle has no regional wall motion abnormalities. There is mild asymmetric left ventricular hypertrophy of the basal-septal segment. Left ventricular diastolic parameters were normal. The average left ventricular global longitudinal strain is -21.5 %.  2. Right ventricular systolic function is normal. The right ventricular size is mildly enlarged.  3. The mitral valve is normal in structure. Trivial mitral valve regurgitation.  4. The aortic valve is tricuspid. Aortic valve regurgitation is not visualized. Aortic valve sclerosis/calcification is present, without any evidence of aortic stenosis.  5. Aortic dilatation noted. There is mild dilatation of the ascending aorta, measuring 38 mm. FINDINGS  Left Ventricle: Left ventricular ejection fraction, by estimation, is 55 to 60%. Left ventricular ejection fraction by 3D volume is 56 %. The left ventricle has normal function. The left ventricle has no regional wall motion abnormalities. The average left ventricular global longitudinal strain is -21.5 %. The left ventricular internal cavity size was normal in size. There is mild asymmetric left ventricular hypertrophy of the basal-septal segment. Left ventricular diastolic parameters were normal. Right Ventricle: The right ventricular size is mildly enlarged. No  increase in right ventricular wall thickness. Right ventricular systolic function is normal. Left Atrium: Left atrial size was normal in size. Right Atrium: Right atrial size was normal in size. Pericardium: Ther

## 2021-12-21 DIAGNOSIS — Z981 Arthrodesis status: Secondary | ICD-10-CM | POA: Diagnosis not present

## 2021-12-21 DIAGNOSIS — I10 Essential (primary) hypertension: Secondary | ICD-10-CM | POA: Diagnosis not present

## 2021-12-21 DIAGNOSIS — Z4789 Encounter for other orthopedic aftercare: Secondary | ICD-10-CM | POA: Diagnosis not present

## 2021-12-21 DIAGNOSIS — M5030 Other cervical disc degeneration, unspecified cervical region: Secondary | ICD-10-CM | POA: Diagnosis not present

## 2021-12-21 DIAGNOSIS — R739 Hyperglycemia, unspecified: Secondary | ICD-10-CM | POA: Diagnosis not present

## 2021-12-21 DIAGNOSIS — G4733 Obstructive sleep apnea (adult) (pediatric): Secondary | ICD-10-CM | POA: Diagnosis not present

## 2021-12-24 DIAGNOSIS — R739 Hyperglycemia, unspecified: Secondary | ICD-10-CM | POA: Diagnosis not present

## 2021-12-24 DIAGNOSIS — Z981 Arthrodesis status: Secondary | ICD-10-CM | POA: Diagnosis not present

## 2021-12-24 DIAGNOSIS — I10 Essential (primary) hypertension: Secondary | ICD-10-CM | POA: Diagnosis not present

## 2021-12-24 DIAGNOSIS — Z4789 Encounter for other orthopedic aftercare: Secondary | ICD-10-CM | POA: Diagnosis not present

## 2021-12-24 DIAGNOSIS — G4733 Obstructive sleep apnea (adult) (pediatric): Secondary | ICD-10-CM | POA: Diagnosis not present

## 2021-12-24 DIAGNOSIS — M5030 Other cervical disc degeneration, unspecified cervical region: Secondary | ICD-10-CM | POA: Diagnosis not present

## 2021-12-28 DIAGNOSIS — M5030 Other cervical disc degeneration, unspecified cervical region: Secondary | ICD-10-CM | POA: Diagnosis not present

## 2021-12-28 DIAGNOSIS — Z981 Arthrodesis status: Secondary | ICD-10-CM | POA: Diagnosis not present

## 2021-12-28 DIAGNOSIS — R739 Hyperglycemia, unspecified: Secondary | ICD-10-CM | POA: Diagnosis not present

## 2021-12-28 DIAGNOSIS — I10 Essential (primary) hypertension: Secondary | ICD-10-CM | POA: Diagnosis not present

## 2021-12-28 DIAGNOSIS — Z4789 Encounter for other orthopedic aftercare: Secondary | ICD-10-CM | POA: Diagnosis not present

## 2021-12-28 DIAGNOSIS — G4733 Obstructive sleep apnea (adult) (pediatric): Secondary | ICD-10-CM | POA: Diagnosis not present

## 2022-01-04 ENCOUNTER — Encounter: Payer: Self-pay | Admitting: Family Medicine

## 2022-01-22 DIAGNOSIS — M48062 Spinal stenosis, lumbar region with neurogenic claudication: Secondary | ICD-10-CM | POA: Diagnosis not present

## 2022-01-22 DIAGNOSIS — Z9889 Other specified postprocedural states: Secondary | ICD-10-CM | POA: Diagnosis not present

## 2022-03-05 DIAGNOSIS — M4326 Fusion of spine, lumbar region: Secondary | ICD-10-CM | POA: Diagnosis not present

## 2022-03-05 DIAGNOSIS — Z9889 Other specified postprocedural states: Secondary | ICD-10-CM | POA: Diagnosis not present

## 2022-03-08 DIAGNOSIS — Z9889 Other specified postprocedural states: Secondary | ICD-10-CM | POA: Diagnosis not present

## 2022-03-08 DIAGNOSIS — M6281 Muscle weakness (generalized): Secondary | ICD-10-CM | POA: Diagnosis not present

## 2022-03-10 DIAGNOSIS — Z9889 Other specified postprocedural states: Secondary | ICD-10-CM | POA: Diagnosis not present

## 2022-03-10 DIAGNOSIS — M6281 Muscle weakness (generalized): Secondary | ICD-10-CM | POA: Diagnosis not present

## 2022-03-12 ENCOUNTER — Encounter: Payer: Self-pay | Admitting: Cardiology

## 2022-03-12 ENCOUNTER — Ambulatory Visit (INDEPENDENT_AMBULATORY_CARE_PROVIDER_SITE_OTHER): Payer: Medicare Other | Admitting: Cardiology

## 2022-03-12 VITALS — BP 136/58 | HR 74 | Ht 71.5 in | Wt 229.6 lb

## 2022-03-12 DIAGNOSIS — I77810 Thoracic aortic ectasia: Secondary | ICD-10-CM

## 2022-03-12 DIAGNOSIS — E785 Hyperlipidemia, unspecified: Secondary | ICD-10-CM

## 2022-03-12 DIAGNOSIS — I10 Essential (primary) hypertension: Secondary | ICD-10-CM | POA: Diagnosis not present

## 2022-03-12 DIAGNOSIS — I452 Bifascicular block: Secondary | ICD-10-CM

## 2022-03-12 NOTE — Patient Instructions (Signed)

## 2022-03-12 NOTE — Progress Notes (Unsigned)
Primary Care Provider: [name], MD Cardiologist: Glenetta Hew, MD Electrophysiologist: None  Clinic Note: No chief complaint on file.   ===================================  ASSESSMENT/PLAN   Problem List Items Addressed This Visit       Cardiology Problems   Hyperlipidemia with target LDL less than 100 - Primary (Chronic)   Ascending aorta dilatation (HCC) (Chronic)   Bifascicular block (Chronic)   Essential hypertension (Chronic)    ===================================  HPI:    Ronald Ridgley. is a 71 y.o. male with a PMH below who presents today for ***. Ronald Bullock. is a 71 y.o. male who is being seen today for the evaluation of *** at the request of [name], MD.  Ronald Husky. was last seen on ***  Recent Hospitalizations: ***  Reviewed  CV studies:    The following studies were reviewed today: (if available, images/films reviewed: From Epic Chart or Care Everywhere) ***:   Interval History:   Ronald Husky.   CV Review of Symptoms (Summary) Cardiovascular ROS: {roscv:310661}  REVIEWED OF SYSTEMS   ROS  I have reviewed and (if needed) personally updated the patient's problem list, medications, allergies, past medical and surgical history, social and family history.   PAST MEDICAL HISTORY   Past Medical History:  Diagnosis Date   Arthritis    L end stage knee DKD, mild R knee DJD, R hip end stage DJD   DDD (degenerative disc disease), cervical    multilevel   Gout    rare flares   HLD (hyperlipidemia)    Low HDL   Hyperglycemia    Hypertension    Lumbar spinal stenosis 11/15/2014   severe central canal stenosis L3/4;L4/5 bilat facet hypertrophy, annular disc bulging, grade 1 sphondylolisthesis (Dumonski) planned PT and ESI (Wang)   Lumbar spinal stenosis    s/p lumbar decompressive surgery 11/2021 (Dumonski)   OSA on CPAP    Osteoarthritis    R knee and hip, s/p L TKR    PAST  SURGICAL HISTORY   Past Surgical History:  Procedure Laterality Date   cardiolyte  1997   WNL   carotid US  1997   WNL   CATARACT EXTRACTION W/PHACO Left 12/24/2014   Procedure: CATARACT EXTRACTION PHACO AND INTRAOCULAR LENS PLACEMENT ([facility]);  Surgeon: Birder Robson, MD; [place] 01:16AP% 26.4Cde 20.17   COLONOSCOPY  1998   WNL   COLONOSCOPY  06/2010   WNL, internal hemorrhoids, rec rpt 10 yrs   COLONOSCOPY  07/2021   TAx3, HPx1, int hem, rpt 5 yrs [name])   CT HEAD LIMITED W/CM  1997   WNL   ESOPHAGOGASTRODUODENOSCOPY  1998   gastric polyps, benign   JOINT REPLACEMENT  1968   Left Knee, cartilage removed   KNEE ARTHROSCOPY  1968   cartilage removal   LUMBAR EPIDURAL INJECTION  02/2017   L3-4 interlaminar injection Mina Marble)   MRI lumbar  2000   mild bulge L3/4, mild foraminal narrowing L4/5, L5/S2, small disk herniation   MRI lumbar  11/2014   severe L3/4, L4/5 central canal stenosis (Dumonski)   SHOULDER SURGERY Right 05/2019   [facility]   TOTAL HIP ARTHROPLASTY Right 03/27/2013   TOTAL HIP ARTHROPLASTY ANTERIOR APPROACH;  Surgeon: Hessie Dibble, MD   TOTAL KNEE ARTHROPLASTY Left 09/26/2012   TOTAL KNEE ARTHROPLASTY;  Surgeon: Hessie Dibble, MD   TRANSFORAMINAL LUMBAR INTERBODY FUSION (TLIF) WITH PEDICLE SCREW FIXATION 2 LEVEL Left 12/09/2021   Procedure: LEFT-SIDED LUMBAR 3-  LUMBAR 4, LUMBAR 4- LUMBAR 5 TRANSFORAMINAL INTERBODY FUSION AND DECOMPRESSION WITH INSTRUMENTATION AND ALLOGRAFT;  Surgeon: Phylliss Bob, MD;  Location: [place];  Service: Orthopedics;  Laterality: Left;    Immunization History  Administered Date(s) Administered   Fluad Quad(high Dose 65+) 09/16/2020, 09/16/2021   Influenza, High Dose Seasonal PF 06/16/2018, 08/03/2019   PFIZER(Purple Top)SARS-COV-2 Vaccination 09/27/2019, 10/18/2019, 06/13/2020, 03/12/2021   PNEUMOCOCCAL CONJUGATE-20 09/29/2021   Pneumococcal Conjugate-13 02/05/2020   Td 07/16/2001, 11/18/2008   Tdap 03/03/2014   Zoster  Recombinat (Shingrix) 03/10/2020, 07/01/2020    MEDICATIONS/ALLERGIES   Current Meds  Medication Sig   acetaminophen (TYLENOL 8 HOUR) 650 MG CR tablet Take 2 tablets (1,300 mg total) by mouth 2 (two) times daily as needed for pain.   acetaminophen (TYLENOL) 500 MG tablet Take 500 mg by mouth every 6 (six) hours as needed.   allopurinol (ZYLOPRIM) 300 MG tablet Take 1 tablet (300 mg total) by mouth daily.   gabapentin (NEURONTIN) 300 MG capsule Take 300 mg by mouth at bedtime as needed (Nerve pan).   lisinopril (ZESTRIL) 5 MG tablet Take 1 tablet (5 mg total) by mouth daily.   Multiple Vitamins-Minerals (CENTRUM SILVER 50+MEN PO) Take 1 tablet by mouth daily.   OVER THE COUNTER MEDICATION Place 1 spray into both nostrils at bedtime as needed (Decongestant). Nasal decongestant Oxymetazoline 0.5%   Respiratory Therapy Supplies (CARETOUCH CPAP & BIPAP HOSE) MISC by Does not apply route.    Allergies  Allergen Reactions   Omeprazole     Vomiting/diarrhea on BID dosing    SOCIAL HISTORY/FAMILY HISTORY   Reviewed in Epic:  Pertinent findings:  Social History   Tobacco Use   Smoking status: Never   Smokeless tobacco: Never  Vaping Use   Vaping Use: Never used  Substance Use Topics   Alcohol use: No    Comment: Rare   Drug use: No   Social History   Social History Narrative   Lives with wife, has grown son   Activity: no regular exercise-presently limited by left knee pain for 1 prior surgery as well as significant lumbar spinal pain/stenosis with significant radiculopathy down bilateral legs.  Prior to this was ambulatory without any issues.   Diet: good water, some fish, good vegetables, red meat 3x/wk   Right-handed.   Two 32-ounce glasses of tea.    OBJCTIVE -PE, EKG, labs   Wt Readings from Last 3 Encounters:  03/12/22 229 lb 9.6 oz (104.1 kg)  12/09/21 227 lb (103 kg)  12/07/21 227 lb 3.2 oz (103.1 kg)    Physical Exam: BP (!) 136/58   Pulse 74   Ht 5' 11.5"  (1.816 m)   Wt 229 lb 9.6 oz (104.1 kg)   SpO2 96%   BMI 31.58 kg/m  Physical Exam   Adult ECG Report  Rate: *** ;  Rhythm: {rhythm:17366};   Narrative Interpretation: ***  Recent Labs:  ***  Lab Results  Component Value Date   CHOL 151 09/09/2021   HDL 28.50 (L) 09/09/2021   LDLCALC 98 09/09/2021   LDLDIRECT 87.0 08/30/2018   TRIG 123.0 09/09/2021   CHOLHDL 5 09/09/2021   Lab Results  Component Value Date   CREATININE 1.03 12/01/2021   BUN 15 12/01/2021   NA 139 12/01/2021   K 4.1 12/01/2021   CL 106 12/01/2021   CO2 26 12/01/2021      Latest Ref Rng & Units 12/01/2021    9:19 AM 09/09/2021    9:41 AM 08/04/2016  9:36 AM  CBC  WBC 4.0 - 10.5 K/uL 6.0  6.9  5.4   Hemoglobin 13.0 - 17.0 g/dL 12.5  13.2  13.6   Hematocrit 39.0 - 52.0 % 38.7  40.2  39.6   Platelets 150 - 400 K/uL 164  185.0  175.0     Lab Results  Component Value Date   HGBA1C 6.1 09/09/2021   Lab Results  Component Value Date   TSH 3.29 08/30/2018    ================================================== I spent a total of ***minutes with the patient spent in direct patient consultation.  Additional time spent with chart review  / charting (studies, outside notes, etc): *** min Total Time: *** min  Current medicines are reviewed at length with the patient today.  (+/- concerns) ***  Notice: This dictation was prepared with Dragon dictation along with smart phrase technology. Any transcriptional errors that result from this process are unintentional and may not be corrected upon review.  Studies Ordered:   No orders of the defined types were placed in this encounter.  No orders of the defined types were placed in this encounter.   Patient Instructions / Medication Changes & Studies & Tests Ordered   There are no Patient Instructions on file for this visit.     Glenetta Hew, M.D., M.S. Interventional Cardiologist   Pager # [phone] Phone # [phone] [address]  [place], [place] [address]   Thank you for choosing Heartcare at [facility]!!

## 2022-03-15 ENCOUNTER — Encounter: Payer: Self-pay | Admitting: Cardiology

## 2022-03-15 DIAGNOSIS — Z9889 Other specified postprocedural states: Secondary | ICD-10-CM | POA: Diagnosis not present

## 2022-03-15 DIAGNOSIS — M6281 Muscle weakness (generalized): Secondary | ICD-10-CM | POA: Diagnosis not present

## 2022-03-15 NOTE — Assessment & Plan Note (Signed)
38 mm ascending aorta and.  Ronald Bullock is probably not abnormal.  Can potentially monitor for couple years with either echocardiogram or CTA aorta

## 2022-03-15 NOTE — Assessment & Plan Note (Signed)
Borderline BP today.  Is on relatively low-dose lisinopril. Continue to monitor.  As long as his pain is suspect his pressure will improve.

## 2022-03-15 NOTE — Assessment & Plan Note (Signed)
Completely asymptomatic from cardiac standpoint.  No signs or symptoms to suggest chronotropic incompetence or pauses.  No evidence of high-grade AVB.  Would recommend avoiding AV nodal agents such as beta-blockers, nondihydropyridine calcium channel blockers and digoxin.  Structurally normal heart on echocardiogram

## 2022-03-15 NOTE — Assessment & Plan Note (Signed)
LDL as of January was 98.  Lipids have been monitored by PCP.  With no active evidence of CAD, lower than 100 started, but would prefer less than 70. At this point can hold off on initiating therapy.

## 2022-03-15 NOTE — Progress Notes (Incomplete)
Primary Care Provider: [name], MD Cardiologist: Glenetta Hew, MD Electrophysiologist: None  Clinic Note: No chief complaint on file.  ===================================  ASSESSMENT/PLAN   Problem List Items Addressed This Visit       Cardiology Problems   Hyperlipidemia with target LDL less than 100 - Primary (Chronic)   Ascending aorta dilatation (HCC) (Chronic)   Bifascicular block (Chronic)   Essential hypertension (Chronic)    ===================================  HPI:    Ronald Habenicht. is a 71 y.o. male with a PMH notable for spinal stenosis (s/p recent back surgery) with HTN, HLD, OSA on CPAP and gout who presents today for post hospital follow-up.  I saw Ronald Bullock. for preop consultation at the request of [name], MD on 12/07/2021 patient EKG showing bifascicular block. => But when I saw him, the echocardiogram educated be completed.  See results below [place].  Relatively normal.  He denied any symptoms to suggest exercise intolerance or chronotropic incompetence.  Nothing to suggest high-grade AV block.  We mainly discussed avoiding AV nodal agents so as not to exacerbate conduction abnormality.  He really was not able to be active because of his low back pain radiculopathy.  He also has difficulty with his left knee adequately recovered from surgery.  Difficult to stretch and straighten out.  Therefore prefers limited walking.  He has left greater than right leg swelling. => Recommended proceed with the surgery without further cardiac evaluation.  Recent Hospitalizations: ***  Reviewed  CV studies:    The following studies were reviewed today: (if available, images/films reviewed: From Epic Chart or Care Everywhere) ***:   Interval History:   Ronald Bullock.   CV Review of Symptoms (Summary) Cardiovascular ROS: {roscv:310661}  REVIEWED OF SYSTEMS   ROS  I have reviewed and (if needed) personally updated  the patient's problem list, medications, allergies, past medical and surgical history, social and family history.   PAST MEDICAL HISTORY   Past Medical History:  Diagnosis Date  . Arthritis    L end stage knee DKD, mild R knee DJD, R hip end stage DJD  . DDD (degenerative disc disease), cervical    multilevel  . Gout    rare flares  . HLD (hyperlipidemia)    Low HDL  . Hyperglycemia   . Hypertension   . Lumbar spinal stenosis 11/15/2014   severe central canal stenosis L3/4;L4/5 bilat facet hypertrophy, annular disc bulging, grade 1 sphondylolisthesis (Dumonski) planned PT and ESI Mina Marble)  . Lumbar spinal stenosis    s/p lumbar decompressive surgery 11/2021 (Dumonski)  . OSA on CPAP   . Osteoarthritis    R knee and hip, s/p L TKR    PAST SURGICAL HISTORY   Past Surgical History:  Procedure Laterality Date  . cardiolyte  1997   WNL  . carotid US  1997   WNL  . CATARACT EXTRACTION W/PHACO Left 12/24/2014   Procedure: CATARACT EXTRACTION PHACO AND INTRAOCULAR LENS PLACEMENT ([facility]);  Surgeon: Birder Robson, MD; [place] 01:16AP% 26.4Cde 20.17  . COLONOSCOPY  1998   WNL  . COLONOSCOPY  06/2010   WNL, internal hemorrhoids, rec rpt 10 yrs  . COLONOSCOPY  07/2021   TAx3, HPx1, int hem, rpt 5 yrs [name])  . CT HEAD LIMITED W/CM  1997   WNL  . ESOPHAGOGASTRODUODENOSCOPY  1998   gastric polyps, benign  . JOINT REPLACEMENT  1968   Left Knee, cartilage removed  . KNEE ARTHROSCOPY  1968  cartilage removal  . LUMBAR EPIDURAL INJECTION  02/2017   L3-4 interlaminar injection Mina Marble)  . MRI lumbar  2000   mild bulge L3/4, mild foraminal narrowing L4/5, L5/S2, small disk herniation  . MRI lumbar  11/2014   severe L3/4, L4/5 central canal stenosis (Dumonski)  . SHOULDER SURGERY Right 05/2019   Tamera Punt  . TOTAL HIP ARTHROPLASTY Right 03/27/2013   TOTAL HIP ARTHROPLASTY ANTERIOR APPROACH;  Surgeon: Hessie Dibble, MD  . TOTAL KNEE ARTHROPLASTY Left 09/26/2012   TOTAL KNEE  ARTHROPLASTY;  Surgeon: Hessie Dibble, MD  . TRANSFORAMINAL LUMBAR INTERBODY FUSION (TLIF) WITH PEDICLE SCREW FIXATION 2 LEVEL Left 12/09/2021   Procedure: LEFT-SIDED LUMBAR 3- LUMBAR 4, LUMBAR 4- LUMBAR 5 TRANSFORAMINAL INTERBODY FUSION AND DECOMPRESSION WITH INSTRUMENTATION AND ALLOGRAFT;  Surgeon: Phylliss Bob, MD;  Location: [place];  Service: Orthopedics;  Laterality: Left;    Immunization History  Administered Date(s) Administered  . Fluad Quad(high Dose 65+) 09/16/2020, 09/16/2021  . Influenza, High Dose Seasonal PF 06/16/2018, 08/03/2019  . PFIZER(Purple Top)SARS-COV-2 Vaccination 09/27/2019, 10/18/2019, 06/13/2020, 03/12/2021  . PNEUMOCOCCAL CONJUGATE-20 09/29/2021  . Pneumococcal Conjugate-13 02/05/2020  . Td 07/16/2001, 11/18/2008  . Tdap 03/03/2014  . Zoster Recombinat (Shingrix) 03/10/2020, 07/01/2020    MEDICATIONS/ALLERGIES   Current Meds  Medication Sig  . acetaminophen (TYLENOL 8 HOUR) 650 MG CR tablet Take 2 tablets (1,300 mg total) by mouth 2 (two) times daily as needed for pain.  Marland Kitchen acetaminophen (TYLENOL) 500 MG tablet Take 500 mg by mouth every 6 (six) hours as needed.  Marland Kitchen allopurinol (ZYLOPRIM) 300 MG tablet Take 1 tablet (300 mg total) by mouth daily.  Marland Kitchen gabapentin (NEURONTIN) 300 MG capsule Take 300 mg by mouth at bedtime as needed (Nerve pan).  Marland Kitchen lisinopril (ZESTRIL) 5 MG tablet Take 1 tablet (5 mg total) by mouth daily.  . Multiple Vitamins-Minerals (CENTRUM SILVER 50+MEN PO) Take 1 tablet by mouth daily.  Marland Kitchen OVER THE COUNTER MEDICATION Place 1 spray into both nostrils at bedtime as needed (Decongestant). Nasal decongestant Oxymetazoline 0.5%  . Respiratory Therapy Supplies (CARETOUCH CPAP & BIPAP HOSE) MISC by Does not apply route.    Allergies  Allergen Reactions  . Omeprazole     Vomiting/diarrhea on BID dosing    SOCIAL HISTORY/FAMILY HISTORY   Reviewed in Epic:  Pertinent findings:  Social History   Tobacco Use  . Smoking status: Never  .  Smokeless tobacco: Never  Vaping Use  . Vaping Use: Never used  Substance Use Topics  . Alcohol use: No    Comment: Rare  . Drug use: No   Social History   Social History Narrative   Lives with wife, has grown son   Activity: no regular exercise-presently limited by left knee pain for 1 prior surgery as well as significant lumbar spinal pain/stenosis with significant radiculopathy down bilateral legs.  Prior to this was ambulatory without any issues.   Diet: good water, some fish, good vegetables, red meat 3x/wk   Right-handed.   Two 32-ounce glasses of tea.    OBJCTIVE -PE, EKG, labs   Wt Readings from Last 3 Encounters:  03/12/22 229 lb 9.6 oz (104.1 kg)  12/09/21 227 lb (103 kg)  12/07/21 227 lb 3.2 oz (103.1 kg)    Physical Exam: BP (!) 136/58   Pulse 74   Ht 5' 11.5" (1.816 m)   Wt 229 lb 9.6 oz (104.1 kg)   SpO2 96%   BMI 31.58 kg/m  Physical Exam   Adult ECG  Report  Rate: *** ;  Rhythm: {rhythm:17366};   Narrative Interpretation: ***  Recent Labs:  ***  Lab Results  Component Value Date   CHOL 151 09/09/2021   HDL 28.50 (L) 09/09/2021   LDLCALC 98 09/09/2021   LDLDIRECT 87.0 08/30/2018   TRIG 123.0 09/09/2021   CHOLHDL 5 09/09/2021   Lab Results  Component Value Date   CREATININE 1.03 12/01/2021   BUN 15 12/01/2021   NA 139 12/01/2021   K 4.1 12/01/2021   CL 106 12/01/2021   CO2 26 12/01/2021      Latest Ref Rng & Units 12/01/2021    9:19 AM 09/09/2021    9:41 AM 08/04/2016    9:36 AM  CBC  WBC 4.0 - 10.5 K/uL 6.0  6.9  5.4   Hemoglobin 13.0 - 17.0 g/dL 12.5  13.2  13.6   Hematocrit 39.0 - 52.0 % 38.7  40.2  39.6   Platelets 150 - 400 K/uL 164  185.0  175.0     Lab Results  Component Value Date   HGBA1C 6.1 09/09/2021   Lab Results  Component Value Date   TSH 3.29 08/30/2018    ================================================== I spent a total of ***minutes with the patient spent in direct patient consultation.  Additional time  spent with chart review  / charting (studies, outside notes, etc): *** min Total Time: *** min  Current medicines are reviewed at length with the patient today.  (+/- concerns) ***  Notice: This dictation was prepared with Dragon dictation along with smart phrase technology. Any transcriptional errors that result from this process are unintentional and may not be corrected upon review.  Studies Ordered:   No orders of the defined types were placed in this encounter.  No orders of the defined types were placed in this encounter.   Patient Instructions / Medication Changes & Studies & Tests Ordered   There are no Patient Instructions on file for this visit.     Glenetta Hew, M.D., M.S. Interventional Cardiologist   Pager # [phone] Phone # [phone] [address] [place], [place] [address]   Thank you for choosing Heartcare at [facility]!!

## 2022-03-16 ENCOUNTER — Other Ambulatory Visit (INDEPENDENT_AMBULATORY_CARE_PROVIDER_SITE_OTHER): Payer: Medicare Other

## 2022-03-16 DIAGNOSIS — E785 Hyperlipidemia, unspecified: Secondary | ICD-10-CM

## 2022-03-16 LAB — LIPID PANEL
Cholesterol: 141 mg/dL (ref 0–200)
HDL: 28.7 mg/dL — ABNORMAL LOW (ref >39.00)
LDL Cholesterol: 89 mg/dL (ref 0–99)
NonHDL: 112.75
Total CHOL/HDL Ratio: 5
Triglycerides: 121 mg/dL (ref 0.0–149.0)
VLDL: 24.2 mg/dL (ref 0.0–40.0)

## 2022-03-17 DIAGNOSIS — Z9889 Other specified postprocedural states: Secondary | ICD-10-CM | POA: Diagnosis not present

## 2022-03-17 DIAGNOSIS — M6281 Muscle weakness (generalized): Secondary | ICD-10-CM | POA: Diagnosis not present

## 2022-03-22 DIAGNOSIS — Z9889 Other specified postprocedural states: Secondary | ICD-10-CM | POA: Diagnosis not present

## 2022-03-22 DIAGNOSIS — M6281 Muscle weakness (generalized): Secondary | ICD-10-CM | POA: Diagnosis not present

## 2022-03-24 DIAGNOSIS — Z9889 Other specified postprocedural states: Secondary | ICD-10-CM | POA: Diagnosis not present

## 2022-03-24 DIAGNOSIS — M6281 Muscle weakness (generalized): Secondary | ICD-10-CM | POA: Diagnosis not present

## 2022-03-29 DIAGNOSIS — Z9889 Other specified postprocedural states: Secondary | ICD-10-CM | POA: Diagnosis not present

## 2022-03-29 DIAGNOSIS — M6281 Muscle weakness (generalized): Secondary | ICD-10-CM | POA: Diagnosis not present

## 2022-03-31 DIAGNOSIS — M6281 Muscle weakness (generalized): Secondary | ICD-10-CM | POA: Diagnosis not present

## 2022-03-31 DIAGNOSIS — Z9889 Other specified postprocedural states: Secondary | ICD-10-CM | POA: Diagnosis not present

## 2022-04-05 DIAGNOSIS — M6281 Muscle weakness (generalized): Secondary | ICD-10-CM | POA: Diagnosis not present

## 2022-04-05 DIAGNOSIS — Z9889 Other specified postprocedural states: Secondary | ICD-10-CM | POA: Diagnosis not present

## 2022-04-07 DIAGNOSIS — M6281 Muscle weakness (generalized): Secondary | ICD-10-CM | POA: Diagnosis not present

## 2022-04-07 DIAGNOSIS — Z9889 Other specified postprocedural states: Secondary | ICD-10-CM | POA: Diagnosis not present

## 2022-04-12 DIAGNOSIS — Z9889 Other specified postprocedural states: Secondary | ICD-10-CM | POA: Diagnosis not present

## 2022-04-12 DIAGNOSIS — M6281 Muscle weakness (generalized): Secondary | ICD-10-CM | POA: Diagnosis not present

## 2022-04-14 DIAGNOSIS — M6281 Muscle weakness (generalized): Secondary | ICD-10-CM | POA: Diagnosis not present

## 2022-04-14 DIAGNOSIS — Z9889 Other specified postprocedural states: Secondary | ICD-10-CM | POA: Diagnosis not present

## 2022-05-11 ENCOUNTER — Telehealth: Payer: Self-pay | Admitting: Family Medicine

## 2022-05-11 NOTE — Telephone Encounter (Addendum)
Pt missed call from access nurse  and pt called back; pt is traveling back home now and is about 1 hr away from home. Pt said for couple of weeks on and off pt has had lower rt back pain that radiates down to buttock and hip. Pt said last night the pain was worse than usual. Pt said not like the back pain he had prior to back surgery in April 2023.. pt said he has had no known injury. Pt said he tolerates pain well and this is an on and off pain; pain level is 6. Scheduled pt for appt with Dr Silvio Pate on 05/12/22 (no available appts today). Offered pt appt an appt today at [facility] but pt said he was going home and he was going to call surgeon to see if he could be seen today and if so pt will cb and cancel appt on 05/12/22. UC & ED precautions given and pt voiced understanding. Sending note to Dr Darnell Level as PCP and Dr Silvio Pate.   [facility] - Client TELEPHONE ADVICE RECORD AccessNurse Patient Name: Ronald Bullock Gender: Male DOB: 02-Jul-1951 Age: 71 Y 9 M 28 D Return Phone Number: [phone] (Primary), [phone] (Secondary) Address: City/ State/ Zip: [place] [address] Client [facility] - Client Client Site [place] Provider [name] - MD Contact Type Call Who Is Calling Patient / Member / Family / Caregiver Call Type Triage / Clinical Relationship To Patient Self Return Phone Number [phone] (Primary) Chief Complaint Back Injury Reason for Call Symptomatic / Request for Health Information Initial Comment Answering Transferring Call - No Appt today caller thinks he may have sprained a muscle from his back to buttock on the right side, he also had back surgery in April and has rods in his back Translation No Disp. Time Eilene Ghazi Time) Disposition Final User 05/11/2022 11:54:35 AM Attempt made - message left D'Heur Macky Lower 05/11/2022 12:12:51 PM Attempt made -  message left D'Heur Lucia Gaskins, RN, Vincente Liberty 05/11/2022 12:27:04 PM FINAL ATTEMPT MADE - message left Yes D'Heur Lucia Gaskins RN, Adrienne Final Disposition 05/11/2022 12:27:04 PM FINAL ATTEMPT MADE - message left Yes D'Heur Lucia Gaskins, RN, Adrienn

## 2022-05-11 NOTE — Telephone Encounter (Signed)
Patient called and stated that he think he has sprained a muscle from his back to his buttocks and can barely sit down and stand up. Patient was sent to access nurse.

## 2022-05-11 NOTE — Telephone Encounter (Signed)
Noted! Thank you

## 2022-05-11 NOTE — Telephone Encounter (Signed)
Okay---will check him tomorrow

## 2022-05-12 ENCOUNTER — Ambulatory Visit (INDEPENDENT_AMBULATORY_CARE_PROVIDER_SITE_OTHER): Payer: Medicare Other | Admitting: Internal Medicine

## 2022-05-12 ENCOUNTER — Encounter: Payer: Self-pay | Admitting: Internal Medicine

## 2022-05-12 DIAGNOSIS — R109 Unspecified abdominal pain: Secondary | ICD-10-CM | POA: Diagnosis not present

## 2022-05-12 NOTE — Progress Notes (Signed)
Subjective:    Patient ID: Ronald Bullock., male    DOB: 08/31/1950, 71 y.o.   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     acetaminophen (TYLENOL) 500 MG tablet Take 500 mg by mouth every 6 (six) hours as needed.     allopurinol (ZYLOPRIM) 300 MG tablet Take 1 tablet (300 mg total) by mouth daily. 90 tablet 3   gabapentin (NEURONTIN) 300 MG capsule Take 300 mg by mouth at bedtime as needed (Nerve pan).     lisinopril (ZESTRIL) 5 MG tablet Take 1 tablet (5 mg total) by mouth daily. 90 tablet 3   Multiple Vitamins-Minerals (CENTRUM SILVER 50+MEN PO) Take 1 tablet by mouth daily.     Omega-3 Fatty Acids (FISH OIL) 1000 MG CAPS Take 2 capsules by mouth daily.     OVER THE COUNTER MEDICATION Place 1 spray into both nostrils at bedtime as needed (Decongestant). Nasal decongestant Oxymetazoline 0.5%     Respiratory Therapy Supplies (CARETOUCH CPAP & BIPAP HOSE) MISC by Does not apply route.     No current facility-administered medications on file prior to visit.    Allergies  Allergen Reactions   Omeprazole     Vomiting/diarrhea on BID dosing    Past Medical History:  Diagnosis Date   Arthritis    L end stage knee DKD, mild R knee DJD, R hip end stage DJD   DDD (degenerative disc disease), cervical     multilevel   Gout    rare flares   HLD (hyperlipidemia)    Low HDL   Hyperglycemia    Hypertension    Lumbar spinal stenosis 11/15/2014   severe central canal stenosis L3/4;L4/5 bilat facet hypertrophy, annular disc bulging, grade 1 sphondylolisthesis (Dumonski) planned PT and ESI Mina Marble)   Lumbar spinal stenosis    s/p lumbar decompressive surgery 11/2021 (Dumonski)   OSA on CPAP    Osteoarthritis    R knee and hip, s/p L TKR    Past Surgical History:  Procedure Laterality Date   cardiolyte  1997   WNL   carotid US  1997   WNL   CATARACT EXTRACTION W/PHACO Left 12/24/2014   Procedure: CATARACT EXTRACTION PHACO AND INTRAOCULAR LENS PLACEMENT ([facility]);  Surgeon: Birder Robson, MD; [place] 01:16AP% 26.4Cde 20.17   COLONOSCOPY  1998   WNL   COLONOSCOPY  06/2010   WNL, internal hemorrhoids, rec rpt 10 yrs   COLONOSCOPY  07/2021   TAx3, HPx1, int hem, rpt 5 yrs [name])   CT HEAD LIMITED W/CM  1997   WNL   ESOPHAGOGASTRODUODENOSCOPY  1998   gastric polyps, benign   JOINT REPLACEMENT  1968   Left Knee, cartilage removed   KNEE ARTHROSCOPY  1968   cartilage removal   LUMBAR EPIDURAL INJECTION  02/2017   L3-4 interlaminar injection (  Wang)   MRI lumbar  2000   mild bulge L3/4, mild foraminal narrowing L4/5, L5/S2, small disk herniation   MRI lumbar  11/2014   severe L3/4, L4/5 central canal stenosis (Dumonski)   SHOULDER SURGERY Right 05/2019   [facility]   TOTAL HIP ARTHROPLASTY Right 03/27/2013   TOTAL HIP ARTHROPLASTY ANTERIOR APPROACH;  Surgeon: Hessie Dibble, MD   TOTAL KNEE ARTHROPLASTY Left 09/26/2012   TOTAL KNEE ARTHROPLASTY;  Surgeon: Hessie Dibble, MD   TRANSFORAMINAL LUMBAR INTERBODY FUSION (TLIF) WITH PEDICLE SCREW FIXATION 2 LEVEL Left 12/09/2021   Procedure: LEFT-SIDED LUMBAR 3- LUMBAR 4, LUMBAR 4- LUMBAR 5 TRANSFORAMINAL INTERBODY FUSION AND DECOMPRESSION WITH INSTRUMENTATION AND ALLOGRAFT;  Surgeon: Phylliss Bob, MD;  Location: [facility];  Service: Orthopedics;   Laterality: Left;   TRANSTHORACIC ECHOCARDIOGRAM  12/08/2021   Normal LV size and function.  EF 55-60 %.  No RWMA.  Mild asymmetric basal septal LVH.  Normal diastolic function.  Mild RV dilation with normal function.  Normal MV, mild AOV calcification-sclerosis but no stenosis.    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  Prior to this was ambulatory without any issues.   Diet: good water, some fish, good vegetables, red meat 3x/wk   Right-handed.   Two 32-ounce glasses of tea.   Social Determinants of Health   Financial Resource Strain: Low Risk  (09/14/2021)   Overall Financial Resource Strain (CARDIA)    Difficulty of Paying Living Expenses: Not hard at all  Food Insecurity: No Food Insecurity (09/14/2021)   Hunger Vital Sign    Worried About Running Out of Food in the Last Year: Never true    Ran Out of Food in the Last Year:  Never true  Transportation Needs: No Transportation Needs (09/14/2021)   PRAPARE - Hydrologist (Medical): No    Lack of Transportation (Non-Medical): No  Physical Activity: Insufficiently Active (09/14/2021)   Exercise Vital Sign    Days of Exercise per Week: 2 days    Minutes of Exercise per Session: 30 min  Stress: No Stress Concern Present (09/14/2021)   [place]  Questionnaire    Feeling of Stress : Only a little  Social Connections: Moderately Integrated (09/14/2021)   Social Connection and Isolation Panel [NHANES]    Frequency of Communication with Friends and Family: More than three times a week    Frequency of Social Gatherings with Friends and Family: More than three times a week    Attends Religious Services: 1 to 4 times per year    Active Member of [employer] or Organizations: No    Attends Archivist Meetings: Never    Marital Status: Married  Human resources officer Violence: Not At Risk (09/14/2021)   Humiliation, Afraid, Rape, and Kick questionnaire    Fear of Current or Ex-Partner: No    Emotionally Abused: No    Physically Abused: No    Sexually Abused: No   Review of Systems No saddle anaesthesia No change in bladder or bowel control     Objective:   Physical Exam Constitutional:      Appearance: Normal appearance.  Musculoskeletal:     Comments: No spine tenderness Pain area is right flank (?oblique) Some pain with right hip flexion--but SLR negative Normal ROM in hips  Neurological:     Mental Status: He is alert.     Comments: Normal gait Mild weakness in right hip flexors--normal otherwise            Assessment & Plan:

## 2022-05-12 NOTE — Assessment & Plan Note (Signed)
Seems to be muscular Recommended heat Doesn't think NSAIDs help Continue tylenol/topical diclofenac Can use methocarbamol he has --at bedtime

## 2022-05-31 DIAGNOSIS — Z23 Encounter for immunization: Secondary | ICD-10-CM | POA: Diagnosis not present

## 2022-06-24 ENCOUNTER — Other Ambulatory Visit: Payer: Self-pay | Admitting: Family Medicine

## 2022-06-24 NOTE — Telephone Encounter (Signed)
Patient called in and stated that he had back surgery in April and was prescribed gabapentin (NEURONTIN) 300 MG capsule. The back surgeon who originally prescribed the medication stated they won't be able to refill it anymore and to contact his PCP. He was wondering if Dr. Darnell Level could send over a prescription for medication to  [id number] [place], [place] . Thank you!

## 2022-06-24 NOTE — Telephone Encounter (Signed)
Gabapentin Last OV:  09/16/21, CPE Next OV:  09/21/22, CPE

## 2022-06-25 MED ORDER — GABAPENTIN 300 MG PO CAPS: 300.0000 mg | ORAL_CAPSULE | Freq: Every evening | ORAL | 1 refills | Status: DC | PRN

## 2022-06-25 NOTE — Telephone Encounter (Signed)
ERx 

## 2022-08-23 DIAGNOSIS — M5416 Radiculopathy, lumbar region: Secondary | ICD-10-CM | POA: Diagnosis not present

## 2022-08-26 DIAGNOSIS — H2511 Age-related nuclear cataract, right eye: Secondary | ICD-10-CM | POA: Diagnosis not present

## 2022-08-26 DIAGNOSIS — H5211 Myopia, right eye: Secondary | ICD-10-CM | POA: Diagnosis not present

## 2022-08-26 DIAGNOSIS — Z961 Presence of intraocular lens: Secondary | ICD-10-CM | POA: Diagnosis not present

## 2022-08-27 DIAGNOSIS — M4326 Fusion of spine, lumbar region: Secondary | ICD-10-CM | POA: Diagnosis not present

## 2022-08-27 DIAGNOSIS — M545 Low back pain, unspecified: Secondary | ICD-10-CM | POA: Diagnosis not present

## 2022-09-03 ENCOUNTER — Other Ambulatory Visit (HOSPITAL_COMMUNITY): Payer: Self-pay | Admitting: Orthopaedic Surgery

## 2022-09-03 DIAGNOSIS — M25562 Pain in left knee: Secondary | ICD-10-CM | POA: Diagnosis not present

## 2022-09-11 ENCOUNTER — Other Ambulatory Visit: Payer: Self-pay | Admitting: Family Medicine

## 2022-09-11 DIAGNOSIS — Z125 Encounter for screening for malignant neoplasm of prostate: Secondary | ICD-10-CM

## 2022-09-11 DIAGNOSIS — M1A00X0 Idiopathic chronic gout, unspecified site, without tophus (tophi): Secondary | ICD-10-CM

## 2022-09-11 DIAGNOSIS — D649 Anemia, unspecified: Secondary | ICD-10-CM

## 2022-09-11 DIAGNOSIS — E785 Hyperlipidemia, unspecified: Secondary | ICD-10-CM

## 2022-09-11 DIAGNOSIS — R7303 Prediabetes: Secondary | ICD-10-CM

## 2022-09-15 ENCOUNTER — Other Ambulatory Visit (INDEPENDENT_AMBULATORY_CARE_PROVIDER_SITE_OTHER): Payer: Medicare Other

## 2022-09-15 ENCOUNTER — Ambulatory Visit (INDEPENDENT_AMBULATORY_CARE_PROVIDER_SITE_OTHER): Payer: Medicare Other

## 2022-09-15 VITALS — Wt 217.0 lb

## 2022-09-15 DIAGNOSIS — Z125 Encounter for screening for malignant neoplasm of prostate: Secondary | ICD-10-CM

## 2022-09-15 DIAGNOSIS — Z0000 Encounter for general adult medical examination without abnormal findings: Secondary | ICD-10-CM | POA: Diagnosis not present

## 2022-09-15 DIAGNOSIS — M1A00X0 Idiopathic chronic gout, unspecified site, without tophus (tophi): Secondary | ICD-10-CM

## 2022-09-15 DIAGNOSIS — E785 Hyperlipidemia, unspecified: Secondary | ICD-10-CM

## 2022-09-15 DIAGNOSIS — R7303 Prediabetes: Secondary | ICD-10-CM

## 2022-09-15 DIAGNOSIS — D649 Anemia, unspecified: Secondary | ICD-10-CM | POA: Diagnosis not present

## 2022-09-15 LAB — CBC WITH DIFFERENTIAL/PLATELET
Basophils Absolute: 0 10*3/uL (ref 0.0–0.1)
Basophils Relative: 0.4 % (ref 0.0–3.0)
Eosinophils Absolute: 0.2 10*3/uL (ref 0.0–0.7)
Eosinophils Relative: 3.5 % (ref 0.0–5.0)
HCT: 39.6 % (ref 39.0–52.0)
Hemoglobin: 13.4 g/dL (ref 13.0–17.0)
Lymphocytes Relative: 28.7 % (ref 12.0–46.0)
Lymphs Abs: 1.9 10*3/uL (ref 0.7–4.0)
MCHC: 33.9 g/dL (ref 30.0–36.0)
MCV: 91 fl (ref 78.0–100.0)
Monocytes Absolute: 0.7 10*3/uL (ref 0.1–1.0)
Monocytes Relative: 10.7 % (ref 3.0–12.0)
Neutro Abs: 3.8 10*3/uL (ref 1.4–7.7)
Neutrophils Relative %: 56.7 % (ref 43.0–77.0)
Platelets: 207 10*3/uL (ref 150.0–400.0)
RBC: 4.35 Mil/uL (ref 4.22–5.81)
RDW: 14.5 % (ref 11.5–15.5)
WBC: 6.6 10*3/uL (ref 4.0–10.5)

## 2022-09-15 LAB — COMPREHENSIVE METABOLIC PANEL
ALT: 27 U/L (ref 0–53)
AST: 22 U/L (ref 0–37)
Albumin: 4.2 g/dL (ref 3.5–5.2)
Alkaline Phosphatase: 75 U/L (ref 39–117)
BUN: 18 mg/dL (ref 6–23)
CO2: 30 mEq/L (ref 19–32)
Calcium: 9.1 mg/dL (ref 8.4–10.5)
Chloride: 101 mEq/L (ref 96–112)
Creatinine, Ser: 0.91 mg/dL (ref 0.40–1.50)
GFR: 83.81 mL/min (ref >60.00)
Glucose, Bld: 131 mg/dL — ABNORMAL HIGH (ref 70–99)
Potassium: 5 mEq/L (ref 3.5–5.1)
Sodium: 138 mEq/L (ref 135–145)
Total Bilirubin: 0.5 mg/dL (ref 0.2–1.2)
Total Protein: 6.8 g/dL (ref 6.0–8.3)

## 2022-09-15 LAB — LDL CHOLESTEROL, DIRECT: Direct LDL: 96 mg/dL

## 2022-09-15 LAB — PSA, MEDICARE: PSA: 1.36 ng/ml (ref 0.10–4.00)

## 2022-09-15 LAB — LIPID PANEL
Cholesterol: 152 mg/dL (ref 0–200)
HDL: 26.7 mg/dL — ABNORMAL LOW (ref >39.00)
NonHDL: 125.51
Total CHOL/HDL Ratio: 6
Triglycerides: 202 mg/dL — ABNORMAL HIGH (ref 0.0–149.0)
VLDL: 40.4 mg/dL — ABNORMAL HIGH (ref 0.0–40.0)

## 2022-09-15 LAB — IBC PANEL
Iron: 67 ug/dL (ref 42–165)
Saturation Ratios: 26.2 % (ref 20.0–50.0)
TIBC: 256.2 ug/dL (ref 250.0–450.0)
Transferrin: 183 mg/dL — ABNORMAL LOW (ref 212.0–360.0)

## 2022-09-15 LAB — FERRITIN: Ferritin: 266.3 ng/mL (ref 22.0–322.0)

## 2022-09-15 LAB — URIC ACID: Uric Acid, Serum: 6 mg/dL (ref 4.0–7.8)

## 2022-09-15 LAB — HEMOGLOBIN A1C: Hgb A1c MFr Bld: 6.4 % (ref 4.6–6.5)

## 2022-09-15 NOTE — Progress Notes (Signed)
Virtual Visit via Telephone Note  I connected with  [name] on 09/15/22 at  9:45 AM EST by telephone and verified that I am speaking with the correct person using two identifiers.  Location: Patient: home Provider: [place] Persons participating in the virtual visit: [place]   I discussed the limitations, risks, security and privacy concerns of performing an evaluation and management service by telephone and the availability of in person appointments. The patient expressed understanding and agreed to proceed.  Interactive audio and video telecommunications were attempted between this nurse and patient, however failed, due to patient having technical difficulties OR patient did not have access to video capability.  We continued and completed visit with audio only.  Some vital signs may be absent or patient reported.   Dionisio David, LPN  Subjective:   Leo Fray. is a 72 y.o. male who presents for Medicare Annual/Subsequent preventive examination.  Review of Systems     Cardiac Risk Factors include: advanced age (>47mn, >>68women);hypertension;dyslipidemia;male gender     Objective:    There were no vitals filed for this visit. There is no height or weight on file to calculate BMI.     09/15/2022    9:57 AM 12/09/2021    6:31 AM 12/01/2021    9:14 AM 09/14/2021    9:06 AM 09/12/2020    9:52 AM 09/05/2019    9:46 AM 08/30/2018    8:29 AM  Advanced Directives  Does Patient Have a Medical Advance Directive? Yes Yes Yes Yes Yes Yes Yes  Type of AParamedicof AHiltonsLiving will  HGageLiving will HLedbetterLiving will HF[place]Living will [place]Living will [place] Does patient want to make changes to medical advance directive? No - Patient declined  No - Patient declined Yes  (MAU/Ambulatory/Procedural Areas - Information given)     Copy of HRock Fallsin Chart? Yes - validated most recent copy scanned in chart (See row information)  No - copy requested Yes - validated most recent copy scanned in chart (See row information) Yes - validated most recent copy scanned in chart (See row information) Yes - validated most recent copy scanned in chart (See row information) No - copy requested    Current Medications (verified) Outpatient Encounter Medications as of 09/15/2022  Medication Sig   acetaminophen (TYLENOL 8 HOUR) 650 MG CR tablet Take 2 tablets (1,300 mg total) by mouth 2 (two) times daily as needed for pain.   acetaminophen (TYLENOL) 500 MG tablet Take 500 mg by mouth every 6 (six) hours as needed.   allopurinol (ZYLOPRIM) 300 MG tablet Take 1 tablet (300 mg total) by mouth daily.   gabapentin (NEURONTIN) 300 MG capsule Take 1 capsule (300 mg total) by mouth at bedtime as needed (Nerve pan).   lisinopril (ZESTRIL) 5 MG tablet Take 1 tablet (5 mg total) by mouth daily.   Multiple Vitamins-Minerals (CENTRUM SILVER 50+MEN PO) Take 1 tablet by mouth daily.   Omega-3 Fatty Acids (FISH OIL) 1000 MG CAPS Take 2 capsules by mouth daily.   OVER THE COUNTER MEDICATION Place 1 spray into both nostrils at bedtime as needed (Decongestant). Nasal decongestant Oxymetazoline 0.5%   Respiratory Therapy Supplies (CARETOUCH CPAP & BIPAP HOSE) MISC by Does not apply route.   No facility-administered encounter medications on file as of 09/15/2022.    Allergies (verified) Omeprazole  History: Past Medical History:  Diagnosis Date   Arthritis    L end stage knee DKD, mild R knee DJD, R hip end stage DJD   DDD (degenerative disc disease), cervical    multilevel   Gout    rare flares   HLD (hyperlipidemia)    Low HDL   Hyperglycemia    Hypertension    Lumbar spinal stenosis 11/15/2014   severe central canal stenosis L3/4;L4/5 bilat facet hypertrophy,  annular disc bulging, grade 1 sphondylolisthesis (Dumonski) planned PT and ESI Mina Marble)   Lumbar spinal stenosis    s/p lumbar decompressive surgery 11/2021 (Dumonski)   OSA on CPAP    Osteoarthritis    R knee and hip, s/p L TKR   Past Surgical History:  Procedure Laterality Date   cardiolyte  1997   WNL   carotid US  1997   WNL   CATARACT EXTRACTION W/PHACO Left 12/24/2014   Procedure: CATARACT EXTRACTION PHACO AND INTRAOCULAR LENS PLACEMENT ([place]);  Surgeon: Birder Robson, MD; [place] 01:16AP% 26.4Cde 20.17   COLONOSCOPY  1998   WNL   COLONOSCOPY  06/2010   WNL, internal hemorrhoids, rec rpt 10 yrs   COLONOSCOPY  07/2021   TAx3, HPx1, int hem, rpt 5 yrs [name])   CT HEAD LIMITED W/CM  1997   WNL   ESOPHAGOGASTRODUODENOSCOPY  1998   gastric polyps, benign   JOINT REPLACEMENT  1968   Left Knee, cartilage removed   KNEE ARTHROSCOPY  1968   cartilage removal   LUMBAR EPIDURAL INJECTION  02/2017   L3-4 interlaminar injection Mina Marble)   MRI lumbar  2000   mild bulge L3/4, mild foraminal narrowing L4/5, L5/S2, small disk herniation   MRI lumbar  11/2014   severe L3/4, L4/5 central canal stenosis (Dumonski)   SHOULDER SURGERY Right 05/2019   [facility]   TOTAL HIP ARTHROPLASTY Right 03/27/2013   TOTAL HIP ARTHROPLASTY ANTERIOR APPROACH;  Surgeon: Hessie Dibble, MD   TOTAL KNEE ARTHROPLASTY Left 09/26/2012   TOTAL KNEE ARTHROPLASTY;  Surgeon: Hessie Dibble, MD   TRANSFORAMINAL LUMBAR INTERBODY FUSION (TLIF) WITH PEDICLE SCREW FIXATION 2 LEVEL Left 12/09/2021   Procedure: LEFT-SIDED LUMBAR 3- LUMBAR 4, LUMBAR 4- LUMBAR 5 TRANSFORAMINAL INTERBODY FUSION AND DECOMPRESSION WITH INSTRUMENTATION AND ALLOGRAFT;  Surgeon: Phylliss Bob, MD;  Location: [place];  Service: Orthopedics;  Laterality: Left;   TRANSTHORACIC ECHOCARDIOGRAM  12/08/2021   Normal LV size and function.  EF 55-60 %.  No RWMA.  Mild asymmetric basal septal LVH.  Normal diastolic function.  Mild RV dilation with normal  function.  Normal MV, mild AOV calcification-sclerosis but no stenosis.   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  Prior to this was ambulatory without any issues.   Diet: good water, some fish, good vegetables, red meat 3x/wk   Right-handed.   Two 32-ounce glasses of tea.   Social Determinants of Health   Financial Resource Strain: Low Risk  (09/15/2022)   Overall Financial Resource Strain (CARDIA)    Difficulty of Paying Living Expenses: Not very hard  Food Insecurity: No Food Insecurity (09/15/2022)   Hunger Vital Sign    Worried About Running Out of Food in the Last Year: Never true    Ran Out of Food in the Last Year: Never true  Transportation Needs: No Transportation Needs (09/15/2022)   PRAPARE - Hydrologist (Medical): No    Lack of Transportation (Non-Medical): No  Physical Activity:  Sufficiently Active (09/15/2022)   Exercise Vital Sign    Days of Exercise per Week: 3 days    Minutes of Exercise per Session: 60 min  Stress: No Stress Concern Present (09/15/2022)   [place]    Feeling of Stress : Only a little  Social Connections: Moderately Isolated (09/15/2022)   Social Connection and Isolation Panel [NHANES]    Frequency of Communication with Friends and Family: More than three times a week    Frequency of Social Gatherings with Friends and Family: More than three times a week    Attends Religious Services: Never    Marine scientist or Organizations: No    Attends Music therapist: Never    Marital Status: Married    Tobacco Counseling Counseling given: Not Answered   Clinical Intake:  Pre-visit preparation completed: Yes  Pain : No/denies pain     Nutritional Risks: None Diabetes: No  How often do you need to have someone help you when you read instructions, pamphlets, or other written materials from your doctor or pharmacy?: 1 - Never  Diabetic?  Interpreter Needed?: No  Information entered by :: Kirke Shaggy, LPN   Activities of Daily Living    09/15/2022    9:59 AM 12/01/2021    9:16 AM  In your present state of health, do you have any difficulty performing the following activities:  Hearing? 0   Vision? 0   Difficulty concentrating or making decisions? 0   Walking or climbing stairs? 1   Dressing or bathing? 0   Doing errands, shopping? 0 0  Preparing Food and eating ? N   Using the Toilet? N   In the past six months, have you accidently leaked urine? N   Do you have problems with loss of bowel control? N   Managing your Medications? N   Managing your Finances? N   Housekeeping or managing your Housekeeping? N     Patient Care Team: [name], MD as PCP - General Ellyn Hack Leonie Green, MD as PCP - Cardiology (Cardiology) Arelia Sneddon,  Dorris as Consulting Physician (Optometry) Melrose Nakayama, MD as Consulting Physician (Orthopedic Surgery)  Phylliss Bob, MD as Consulting Physician (Orthopedic Surgery) Normajean Glasgow, MD as Attending Physician (Physical Medicine and Rehabilitation) Eula Listen, DDS as Referring Physician (Dentistry)  Indicate any recent Medical Services you may have received from other than [facility] providers in the past year (date may be approximate).     Assessment:   This is a routine wellness examination for Gwyndolyn Saxon.  Hearing/Vision screen Hearing Screening - Comments:: No aids Vision Screening - Comments:: Wears one contact (right eye)- Dr.Katsoudas  Dietary issues and exercise activities discussed: Current Exercise Habits: Home exercise routine, Type of exercise: stretching;walking, Time (Minutes): 30, Frequency (Times/Week): 3, Weekly Exercise (Minutes/Week): 90, Intensity: Mild   Goals Addressed             This Visit's Progress    DIET - EAT MORE FRUITS AND VEGETABLES         Depression Screen    09/15/2022    9:55 AM 09/14/2021    9:09 AM 09/12/2020    9:53 AM 09/05/2019    9:47 AM 08/30/2018    8:28 AM 08/25/2017    8:41 AM 08/04/2016    8:51 AM  PHQ 2/9 Scores  PHQ - 2 Score 0 0 0 0 0 0 0  PHQ- 9 Score 0  0 0 0 0     Fall Risk    09/15/2022    9:59 AM 09/14/2021    9:07 AM 09/12/2020    9:53 AM 09/05/2019    9:46 AM 08/30/2018    8:28 AM  Fall Risk   Falls in the past year? 1 0 0 0 0  Number falls in past yr: 0 0 0 0   Injury with Fall? 0 0 0 0   Risk for fall due to : History of fall(s) No Fall Risks Medication side effect Medication side effect   Follow up Falls prevention discussed;Falls evaluation completed Falls prevention discussed Falls evaluation completed;Falls prevention discussed Falls evaluation completed;Falls prevention discussed     FALL RISK PREVENTION PERTAINING TO THE HOME:  Any stairs in or around the home? Yes  If so, are there any without handrails? No   Home free of loose throw rugs in walkways, pet beds, electrical cords, etc? Yes  Adequate lighting in your home to reduce risk of falls? Yes   ASSISTIVE DEVICES UTILIZED TO PREVENT FALLS:  Life alert? No  Use of a cane, walker or w/c? No  Grab bars in the bathroom? No  Shower chair or bench in shower? No  Elevated toilet seat or a handicapped toilet? No   Cognitive Function:     09/12/2020    9:56 AM 09/05/2019    9:50 AM 08/30/2018    8:28 AM 08/25/2017    8:43 AM 08/04/2016    8:56 AM  MMSE - Mini Mental State Exam  Orientation to time '5 5 5 5 5  '$ Orientation to Place '5 5 5 5 5  '$ Registration '3 3 3 3 3  '$ Attention/ Calculation 5 5 0 0 0  Recall '3 3 3 2 3  '$ Recall-comments    unable to recall 1 of 3 words   Language- name 2 objects   0 0 0  Language- repeat '1 1 1 1 1  '$ Language- follow 3 step command   '3 3 3  '$ Language- read & follow direction   0 0 0  Write a sentence   0 0 0  Copy design   0 0 0  Total score   20 19  20        09/15/2022   10:03 AM  6CIT Screen  What Year? 0 points  What month? 0 points  What time? 0 points  Count back from 20 0 points  Months in reverse 0 points  Repeat phrase 0 points  Total Score 0 points    Immunizations Immunization History  Administered Date(s) Administered   COVID-19, mRNA, vaccine(Comirnaty)12 years and older 05/31/2022   Fluad Quad(high Dose 65+) 09/16/2020, 09/16/2021, 05/31/2022   Influenza, High Dose Seasonal PF 06/16/2018, 08/03/2019   PFIZER(Purple Top)SARS-COV-2 Vaccination 09/27/2019, 10/18/2019, 06/13/2020, 03/12/2021   PNEUMOCOCCAL CONJUGATE-20 09/29/2021   Pneumococcal Conjugate-13 02/05/2020   Td 07/16/2001, 11/18/2008   Tdap 03/03/2014   Zoster Recombinat (Shingrix) 03/10/2020, 07/01/2020    TDAP status: Up to date  Flu Vaccine status: Up to date  Pneumococcal vaccine status: Up to date  Covid-19 vaccine status: Completed vaccines  Qualifies for Shingles Vaccine? Yes   Zostavax completed No    Shingrix Completed?: Yes  Screening Tests Health Maintenance  Topic Date Due   Diabetic kidney evaluation - Urine ACR  Never done   OPHTHALMOLOGY EXAM  10/15/2019   FOOT EXAM  03/10/2021   HEMOGLOBIN A1C  03/09/2022   Diabetic kidney evaluation - eGFR measurement  12/02/2022   Medicare Annual Wellness (AWV)  09/16/2023   DTaP/Tdap/Td (4 - Td or Tdap) 03/03/2024   COLONOSCOPY (Pts 45-26yr Insurance coverage will need to be confirmed)  08/05/2026   Pneumonia Vaccine 72 Years old  Completed   INFLUENZA VACCINE  Completed   COVID-19 Vaccine  Completed   Hepatitis C Screening  Completed   Zoster Vaccines- Shingrix  Completed   HPV VACCINES  Aged Out    Health Maintenance  Health Maintenance Due  Topic Date Due   Diabetic kidney evaluation - Urine ACR  Never done   OPHTHALMOLOGY EXAM  10/15/2019   FOOT EXAM  03/10/2021   HEMOGLOBIN A1C  03/09/2022    Colorectal cancer screening: Type of screening: Colonoscopy. Completed 08/05/21. Repeat every 5 years  Lung Cancer Screening: (Low Dose CT Chest recommended if Age 74-80years, 30 pack-year currently smoking OR have quit w/in 15years.) does not qualify.   Additional Screening:  Hepatitis C Screening: does qualify; Completed 06/16/15  Vision Screening: Recommended annual ophthalmology exams for early detection of glaucoma and other disorders of the eye. Is the patient up to date with their annual eye exam?  Yes  Who is the provider or what is the name of the office in which the patient attends annual eye exams? Dr.Katsoudas If pt is not established with a provider, would they like to be referred to a provider to establish care? No .   Dental Screening: Recommended annual dental exams for proper oral hygiene  Community Resource Referral / Chronic Care Management: CRR required this visit?  No   CCM required this visit?  No      Plan:     I have personally reviewed and noted the following in the patient's chart:    Medical and social history Use of alcohol, tobacco or illicit drugs  Current medications and supplements including opioid prescriptions. Patient is not currently taking opioid prescriptions. Functional ability and status Nutritional status Physical activity Advanced directives List of other physicians Hospitalizations, surgeries, and ER visits in previous 12 months Vitals Screenings to include cognitive, depression, and falls Referrals and appointments  In addition, I have reviewed and discussed with patient certain preventive protocols, quality metrics, and best practice recommendations.  A written personalized care plan for preventive services as well as general preventive health recommendations were provided to patient.     Dionisio David, LPN   11/22/8117   Nurse Notes:

## 2022-09-15 NOTE — Patient Instructions (Signed)
Ronald Bullock , Thank you for taking time to come for your Medicare Wellness Visit. I appreciate your ongoing commitment to your health goals. Please review the following plan we discussed and let me know if I can assist you in the future.   These are the goals we discussed:  Goals      DIET - EAT MORE FRUITS AND VEGETABLES     Patient Stated     Starting 08/30/2018, I will continue to take medications as prescribed.      Patient Stated     09/05/2019, I will maintain and continue medications as prescribed.      Patient Stated     09/12/2020, I will maintain and continue medications as prescribed.      Patient Stated     Would like to maintain current routine        This is a list of the screening recommended for you and due dates:  Health Maintenance  Topic Date Due   Yearly kidney health urinalysis for diabetes  Never done   Eye exam for diabetics  10/15/2019   Complete foot exam   03/10/2021   Hemoglobin A1C  03/09/2022   Yearly kidney function blood test for diabetes  12/02/2022   Medicare Annual Wellness Visit  09/16/2023   DTaP/Tdap/Td vaccine (4 - Td or Tdap) 03/03/2024   Colon Cancer Screening  08/05/2026   Pneumonia Vaccine  Completed   Flu Shot  Completed   COVID-19 Vaccine  Completed   Hepatitis C Screening: USPSTF Recommendation to screen - Ages 74-79 yo.  Completed   Zoster (Shingles) Vaccine  Completed   HPV Vaccine  Aged Out    Advanced directives: no  Conditions/risks identified: none  Next appointment: Follow up in one year for your annual wellness visit. 09/19/23 @ 8:30 am by phone  Preventive Care 65 Years and Older, Male  Preventive care refers to lifestyle choices and visits with your health care provider that can promote health and wellness. What does preventive care include? A yearly physical exam. This is also called an annual well check. Dental exams once or twice a year. Routine eye exams. Ask your health care provider how often you should  have your eyes checked. Personal lifestyle choices, including: Daily care of your teeth and gums. Regular physical activity. Eating a healthy diet. Avoiding tobacco and drug use. Limiting alcohol use. Practicing safe sex. Taking low doses of aspirin every day. Taking vitamin and mineral supplements as recommended by your health care provider. What happens during an annual well check? The services and screenings done by your health care provider during your annual well check will depend on your age, overall health, lifestyle risk factors, and family history of disease. Counseling  Your health care provider may ask you questions about your: Alcohol use. Tobacco use. Drug use. Emotional well-being. Home and relationship well-being. Sexual activity. Eating habits. History of falls. Memory and ability to understand (cognition). Work and work Statistician. Screening  You may have the following tests or measurements: Height, weight, and BMI. Blood pressure. Lipid and cholesterol levels. These may be checked every 5 years, or more frequently if you are over 50 years old. Skin check. Lung cancer screening. You may have this screening every year starting at age 55 if you have a 30-pack-year history of smoking and currently smoke or have quit within the past 15 years. Fecal occult blood test (FOBT) of the stool. You may have this test every year starting at age 29.  Flexible sigmoidoscopy or colonoscopy. You may have a sigmoidoscopy every 5 years or a colonoscopy every 10 years starting at age 97. Prostate cancer screening. Recommendations will vary depending on your family history and other risks. Hepatitis C blood test. Hepatitis B blood test. Sexually transmitted disease (STD) testing. Diabetes screening. This is done by checking your blood sugar (glucose) after you have not eaten for a while (fasting). You may have this done every 1-3 years. Abdominal aortic aneurysm (AAA) screening. You  may need this if you are a current or former smoker. Osteoporosis. You may be screened starting at age 14 if you are at high risk. Talk with your health care provider about your test results, treatment options, and if necessary, the need for more tests. Vaccines  Your health care provider may recommend certain vaccines, such as: Influenza vaccine. This is recommended every year. Tetanus, diphtheria, and acellular pertussis (Tdap, Td) vaccine. You may need a Td booster every 10 years. Zoster vaccine. You may need this after age 68. Pneumococcal 13-valent conjugate (PCV13) vaccine. One dose is recommended after age 36. Pneumococcal polysaccharide (PPSV23) vaccine. One dose is recommended after age 82. Talk to your health care provider about which screenings and vaccines you need and how often you need them. This information is not intended to replace advice given to you by your health care provider. Make sure you discuss any questions you have with your health care provider. Document Released: 08/29/2015 Document Revised: 04/21/2016 Document Reviewed: 06/03/2015 Elsevier Interactive Patient Education  2017 [place] Prevention in the Home Falls can cause injuries. They can happen to people of all ages. There are many things you can do to make your home safe and to help prevent falls. What can I do on the outside of my home? Regularly fix the edges of walkways and driveways and fix any cracks. Remove anything that might make you trip as you walk through a door, such as a raised step or threshold. Trim any bushes or trees on the path to your home. Use bright outdoor lighting. Clear any walking paths of anything that might make someone trip, such as rocks or tools. Regularly check to see if handrails are loose or broken. Make sure that both sides of any steps have handrails. Any raised decks and porches should have guardrails on the edges. Have any leaves, snow, or ice cleared  regularly. Use sand or salt on walking paths during winter. Clean up any spills in your garage right away. This includes oil or grease spills. What can I do in the bathroom? Use night lights. Install grab bars by the toilet and in the tub and shower. Do not use towel bars as grab bars. Use non-skid mats or decals in the tub or shower. If you need to sit down in the shower, use a plastic, non-slip stool. Keep the floor dry. Clean up any water that spills on the floor as soon as it happens. Remove soap buildup in the tub or shower regularly. Attach bath mats securely with double-sided non-slip rug tape. Do not have throw rugs and other things on the floor that can make you trip. What can I do in the bedroom? Use night lights. Make sure that you have a light by your bed that is easy to reach. Do not use any sheets or blankets that are too big for your bed. They should not hang down onto the floor. Have a firm chair that has side arms. You can use this  for support while you get dressed. Do not have throw rugs and other things on the floor that can make you trip. What can I do in the kitchen? Clean up any spills right away. Avoid walking on wet floors. Keep items that you use a lot in easy-to-reach places. If you need to reach something above you, use a strong step stool that has a grab bar. Keep electrical cords out of the way. Do not use floor polish or wax that makes floors slippery. If you must use wax, use non-skid floor wax. Do not have throw rugs and other things on the floor that can make you trip. What can I do with my stairs? Do not leave any items on the stairs. Make sure that there are handrails on both sides of the stairs and use them. Fix handrails that are broken or loose. Make sure that handrails are as long as the stairways. Check any carpeting to make sure that it is firmly attached to the stairs. Fix any carpet that is loose or worn. Avoid having throw rugs at the top or  bottom of the stairs. If you do have throw rugs, attach them to the floor with carpet tape. Make sure that you have a light switch at the top of the stairs and the bottom of the stairs. If you do not have them, ask someone to add them for you. What else can I do to help prevent falls? Wear shoes that: Do not have high heels. Have rubber bottoms. Are comfortable and fit you well. Are closed at the toe. Do not wear sandals. If you use a stepladder: Make sure that it is fully opened. Do not climb a closed stepladder. Make sure that both sides of the stepladder are locked into place. Ask someone to hold it for you, if possible. Clearly mark and make sure that you can see: Any grab bars or handrails. First and last steps. Where the edge of each step is. Use tools that help you move around (mobility aids) if they are needed. These include: Canes. Walkers. Scooters. Crutches. Turn on the lights when you go into a dark area. Replace any light bulbs as soon as they burn out. Set up your furniture so you have a clear path. Avoid moving your furniture around. If any of your floors are uneven, fix them. If there are any pets around you, be aware of where they are. Review your medicines with your doctor. Some medicines can make you feel dizzy. This can increase your chance of falling. Ask your doctor what other things that you can do to help prevent falls. This information is not intended to replace advice given to you by your health care provider. Make sure you discuss any questions you have with your health care provider. Document Released: 05/29/2009 Document Revised: 01/08/2016 Document Reviewed: 09/06/2014 Elsevier Interactive Patient Education  2017 [employer].

## 2022-09-17 ENCOUNTER — Ambulatory Visit (HOSPITAL_COMMUNITY)
Admission: RE | Admit: 2022-09-17 | Discharge: 2022-09-17 | Disposition: A | Discharge status: Home / Self Care | Payer: Medicare Other | Source: Ambulatory Visit | Attending: Orthopaedic Surgery | Admitting: Orthopaedic Surgery

## 2022-09-17 DIAGNOSIS — M25562 Pain in left knee: Secondary | ICD-10-CM | POA: Insufficient documentation

## 2022-09-17 DIAGNOSIS — Z96652 Presence of left artificial knee joint: Secondary | ICD-10-CM | POA: Diagnosis not present

## 2022-09-17 DIAGNOSIS — M7989 Other specified soft tissue disorders: Secondary | ICD-10-CM | POA: Diagnosis not present

## 2022-09-17 MED ORDER — TECHNETIUM TC 99M MEDRONATE IV KIT
20.0000 | PACK | Freq: Once | INTRAVENOUS | Status: AC | PRN
  Administered 2022-09-17: 22 via INTRAVENOUS

## 2022-09-20 DIAGNOSIS — M25562 Pain in left knee: Secondary | ICD-10-CM | POA: Diagnosis not present

## 2022-09-21 ENCOUNTER — Ambulatory Visit (INDEPENDENT_AMBULATORY_CARE_PROVIDER_SITE_OTHER): Payer: Medicare Other | Admitting: Family Medicine

## 2022-09-21 ENCOUNTER — Ambulatory Visit (INDEPENDENT_AMBULATORY_CARE_PROVIDER_SITE_OTHER)
Admission: RE | Admit: 2022-09-21 | Discharge: 2022-09-21 | Disposition: A | Discharge status: Home / Self Care | Payer: Medicare Other | Source: Ambulatory Visit | Attending: Family Medicine | Admitting: Family Medicine

## 2022-09-21 ENCOUNTER — Encounter: Payer: Self-pay | Admitting: Family Medicine

## 2022-09-21 VITALS — BP 128/62 | HR 70 | Temp 97.5°F | Ht 70.5 in | Wt 223.0 lb

## 2022-09-21 DIAGNOSIS — I10 Essential (primary) hypertension: Secondary | ICD-10-CM | POA: Diagnosis not present

## 2022-09-21 DIAGNOSIS — G4733 Obstructive sleep apnea (adult) (pediatric): Secondary | ICD-10-CM | POA: Diagnosis not present

## 2022-09-21 DIAGNOSIS — T8484XS Pain due to internal orthopedic prosthetic devices, implants and grafts, sequela: Secondary | ICD-10-CM

## 2022-09-21 DIAGNOSIS — E785 Hyperlipidemia, unspecified: Secondary | ICD-10-CM | POA: Diagnosis not present

## 2022-09-21 DIAGNOSIS — D649 Anemia, unspecified: Secondary | ICD-10-CM | POA: Diagnosis not present

## 2022-09-21 DIAGNOSIS — E669 Obesity, unspecified: Secondary | ICD-10-CM

## 2022-09-21 DIAGNOSIS — Z7189 Other specified counseling: Secondary | ICD-10-CM

## 2022-09-21 DIAGNOSIS — Z96652 Presence of left artificial knee joint: Secondary | ICD-10-CM

## 2022-09-21 DIAGNOSIS — M1A00X0 Idiopathic chronic gout, unspecified site, without tophus (tophi): Secondary | ICD-10-CM

## 2022-09-21 DIAGNOSIS — I452 Bifascicular block: Secondary | ICD-10-CM | POA: Diagnosis not present

## 2022-09-21 DIAGNOSIS — I77810 Thoracic aortic ectasia: Secondary | ICD-10-CM | POA: Diagnosis not present

## 2022-09-21 DIAGNOSIS — M159 Polyosteoarthritis, unspecified: Secondary | ICD-10-CM

## 2022-09-21 DIAGNOSIS — M150 Primary generalized (osteo)arthritis: Secondary | ICD-10-CM

## 2022-09-21 DIAGNOSIS — E66811 Obesity, class 1: Secondary | ICD-10-CM

## 2022-09-21 DIAGNOSIS — Z01818 Encounter for other preprocedural examination: Secondary | ICD-10-CM | POA: Diagnosis not present

## 2022-09-21 DIAGNOSIS — R7303 Prediabetes: Secondary | ICD-10-CM

## 2022-09-21 DIAGNOSIS — I679 Cerebrovascular disease, unspecified: Secondary | ICD-10-CM

## 2022-09-21 MED ORDER — ALLOPURINOL 300 MG PO TABS: 300.0000 mg | ORAL_TABLET | Freq: Every day | ORAL | 4 refills | Status: DC

## 2022-09-21 MED ORDER — LISINOPRIL 5 MG PO TABS: 5.0000 mg | ORAL_TABLET | Freq: Every day | ORAL | 4 refills | Status: DC

## 2022-09-21 NOTE — Progress Notes (Signed)
I reviewed health advisor's note, was available for consultation, and agree with documentation and plan.  

## 2022-09-21 NOTE — Assessment & Plan Note (Addendum)
Pt asxs. Saw cardiology Dr Ellyn Hack, rec avoiding AV nodal blocking agents.

## 2022-09-21 NOTE — Assessment & Plan Note (Signed)
Previously discussed.

## 2022-09-21 NOTE — Progress Notes (Unsigned)
Patient ID: Ronald Bullock., male    DOB: 1951-02-23, 72 y.o.   MRN: [id number]  This visit was conducted in person.  BP 128/62   Pulse 70   Temp (!) 97.5 F (36.4 C) (Temporal)   Ht 5' 10.5" (1.791 m)   Wt 223 lb (101.2 kg)   SpO2 99%   BMI 31.54 kg/m    CC: AMW f/u visit  Subjective:   HPI: Ronald George. is a 72 y.o. male presenting on 09/21/2022 for Annual Exam (MCR prt 2 [AWV- 09/15/22]. Wants to discuss allopurinol. )   Saw health advisor last week for medicare wellness visit. Note reviewed.   Daughter and her family moved in with him.   No results found.  [facility] Visit from 09/21/2022 in [facility] [place]  PHQ-2 Total Score 0          09/21/2022    9:33 AM 09/15/2022    9:59 AM 09/14/2021    9:07 AM 09/12/2020    9:53 AM 09/05/2019    9:46 AM  Fall Risk   Falls in the past year? 1 1 0 0 0  Number falls in past yr: 0 0 0 0 0  Injury with Fall? 0 0 0 0 0  Risk for fall due to :  History of fall(s) No Fall Risks Medication side effect Medication side effect  Follow up  Falls prevention discussed;Falls evaluation completed Falls prevention discussed Falls evaluation completed;Falls prevention discussed Falls evaluation completed;Falls prevention discussed  Tripped over branch 5 wks after back surgery, no injury.   Off aspirin due to easy bruising/bleeding.    Spinal stenosis - on gabapentin '300mg'$  nightly PRN - finds it causes insomnia   Bifascicular block - overall asymptomatic. Saw cardiology, rec avoiding AV nodal blocking agents. Heart [place] reassuringly ok. Last seen 02/2022. Rec yearly cards follow up.   OSA - on CPAP nightly, unsure settings. Sees ENT Dr Constance Holster, last seen 03/2021, stable evaluation.   L painful knee s/p replacement - found to have loosening of implant - s/p recent knee joint aspiration by ortho Rhona Raider). Discussing knee revision replacement under spinal anesthesia - requests preop eval  today. Last surgery was lower back 11/2021. Tolerates general anesthesia well, no h/o postop nausea/vomiting, or trouble waking up from surgery. No current chest pain, dyspnea, dizziness, fevers/chills, cough, UTI symptoms.   Last surgical intervention was back surgery 11/2021, recovered well from this.    Preventative: COLONOSCOPY 07/2021 - TAx3, HPx1, int hem, rpt 5 yrs [name]) Prostate - strong stream, no nocturia. No fmhx prostate cancer.  Lung cancer screening - not eligible  Flu - yearly  [place] 09/2019, 10/2019, booster 05/2020, 02/2021, 05/2022 Prevnar-13 01/2020, prevnar-20 09/2021 Tdap 2015 RSV - declines Shingles - 02/2020, 06/2020 Advanced directive at home, copy scanned in chart 11/2018. Ronald Bullock is HCPOA. Does not want prolonged life support if irreversible terminal condition. Healthcare agent directive should be followed over advanced directive.  Seat belt use discussed Sunscreen use discussed, no changing moles on skin. Wears hat outdoors.  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 Interim medical history since our last visit reviewed. Allergies and medications reviewed and updated. Outpatient Medications Prior to Visit  Medication Sig Dispense Refill   Multiple Vitamins-Minerals (CENTRUM SILVER 50+MEN PO) Take 1 tablet by mouth daily.     Omega-3 Fatty Acids (FISH OIL) 1000 MG CAPS Take 2 capsules by mouth daily.     OVER THE COUNTER MEDICATION Place 1 spray into both nostrils at bedtime as needed (Decongestant). Nasal decongestant Oxymetazoline 0.5%     Respiratory Therapy Supplies (CARETOUCH CPAP & BIPAP HOSE) MISC  by Does not apply route.     allopurinol (ZYLOPRIM) 300 MG tablet Take 1 tablet (300 mg total) by mouth daily. 90 tablet 3   gabapentin (NEURONTIN) 300 MG capsule Take 1 capsule (300 mg total) by mouth at bedtime as needed (Nerve pan). 90 capsule 1   lisinopril (ZESTRIL) 5 MG tablet Take 1 tablet (5 mg total) by mouth daily. 90 tablet 3   acetaminophen (TYLENOL 8 HOUR) 650 MG CR tablet Take 2 tablets (1,300 mg total) by mouth 2 (two) times daily as needed for pain.     acetaminophen (TYLENOL) 500 MG tablet Take 500 mg by mouth every 6 (six) hours as needed.     No facility-administered medications prior to visit.     Per HPI unless specifically indicated in ROS section below Review of Systems  Objective:  BP 128/62   Pulse 70   Temp (!) 97.5 F (36.4 C) (Temporal)   Ht 5' 10.5" (1.791 m)   Wt 223 lb (101.2 kg)   SpO2 99%   BMI 31.54 kg/m   Wt Readings from Last 3 Encounters:  09/21/22 223 lb (101.2 kg)  09/15/22 217 lb (98.4 kg)  05/12/22 221 lb (100.2 kg)      Physical Exam Vitals and nursing note reviewed.  Constitutional:      General: He is not in acute distress.    Appearance: Normal appearance. He is well-developed. He is not ill-appearing.  HENT:     Head: Normocephalic and atraumatic.     Right Ear: Hearing, tympanic membrane, ear canal and external ear normal.     Left Ear: Hearing, tympanic membrane, ear canal and external ear normal.  Eyes:     General: No scleral icterus.    Extraocular Movements: Extraocular movements intact.     Conjunctiva/sclera: Conjunctivae normal.     Pupils: Pupils are equal, round, and reactive to light.  Neck:     Thyroid: No thyroid mass or thyromegaly.     Vascular: No carotid bruit.  Cardiovascular:     Rate and Rhythm: Normal rate and regular rhythm.     Pulses: Normal pulses.          Radial pulses are 2+ on the right side and 2+ on the left side.     Heart sounds: Normal heart sounds. No murmur heard. Pulmonary:      Effort: Pulmonary effort is normal. No respiratory distress.     Breath sounds: Normal breath sounds. No wheezing, rhonchi or rales.  Abdominal:     General: Bowel sounds are normal. There is no distension.     Palpations: Abdomen is soft. There is no mass.     Tenderness: There is no abdominal tenderness. There is no guarding or rebound.     Hernia: No hernia is present.  Musculoskeletal:        General: Normal range of motion.     Cervical back: Normal range of  motion and neck supple.     Right lower leg: No edema.     Left lower leg: No edema.  Lymphadenopathy:     Cervical: No cervical adenopathy.  Skin:    General: Skin is warm and dry.     Findings: No rash.  Neurological:     General: No focal deficit present.     Mental Status: He is alert and oriented to person, place, and time.  Psychiatric:        Mood and Affect: Mood normal.        Behavior: Behavior normal.        Thought Content: Thought content normal.        Judgment: Judgment normal.       Results for orders placed or performed in visit on 09/15/22  IBC panel  Result Value Ref Range   Iron 67 42 - 165 ug/dL   Transferrin 183.0 (L) 212.0 - 360.0 mg/dL   Saturation Ratios 26.2 20.0 - 50.0 %   TIBC 256.2 250.0 - 450.0 mcg/dL  Ferritin  Result Value Ref Range   Ferritin 266.3 22.0 - 322.0 ng/mL  CBC with Differential/Platelet  Result Value Ref Range   WBC 6.6 4.0 - 10.5 K/uL   RBC 4.35 4.22 - 5.81 Mil/uL   Hemoglobin 13.4 13.0 - 17.0 g/dL   HCT 39.6 39.0 - 52.0 %   MCV 91.0 78.0 - 100.0 fl   MCHC 33.9 30.0 - 36.0 g/dL   RDW 14.5 11.5 - 15.5 %   Platelets 207.0 150.0 - 400.0 K/uL   Neutrophils Relative % 56.7 43.0 - 77.0 %   Lymphocytes Relative 28.7 12.0 - 46.0 %   Monocytes Relative 10.7 3.0 - 12.0 %   Eosinophils Relative 3.5 0.0 - 5.0 %   Basophils Relative 0.4 0.0 - 3.0 %   Neutro Abs 3.8 1.4 - 7.7 K/uL   Lymphs Abs 1.9 0.7 - 4.0 K/uL   Monocytes Absolute 0.7 0.1 - 1.0 K/uL   Eosinophils  Absolute 0.2 0.0 - 0.7 K/uL   Basophils Absolute 0.0 0.0 - 0.1 K/uL  Uric acid  Result Value Ref Range   Uric Acid, Serum 6.0 4.0 - 7.8 mg/dL  PSA, Medicare  Result Value Ref Range   PSA 1.36 0.10 - 4.00 ng/ml  Hemoglobin A1c  Result Value Ref Range   Hgb A1c MFr Bld 6.4 4.6 - 6.5 %  Comprehensive metabolic panel  Result Value Ref Range   Sodium 138 135 - 145 mEq/L   Potassium 5.0 3.5 - 5.1 mEq/L   Chloride 101 96 - 112 mEq/L   CO2 30 19 - 32 mEq/L   Glucose, Bld 131 (H) 70 - 99 mg/dL   BUN 18 6 - 23 mg/dL   Creatinine, Ser 0.91 0.40 - 1.50 mg/dL   Total Bilirubin 0.5 0.2 - 1.2 mg/dL   Alkaline Phosphatase 75 39 - 117 U/L   AST 22 0 - 37 U/L   ALT 27 0 - 53 U/L   Total Protein 6.8 6.0 - 8.3 g/dL   Albumin 4.2 3.5 - 5.2 g/dL   GFR 83.81 >60.00 mL/min   Calcium 9.1 8.4 - 10.5 mg/dL  Lipid panel  Result Value Ref Range   Cholesterol 152 0 - 200 mg/dL   Triglycerides 202.0 (H) 0.0 - 149.0 mg/dL   HDL 26.70 (L) >39.00 mg/dL   VLDL 40.4 (H) 0.0 - 40.0 mg/dL   Total CHOL/HDL Ratio 6    NonHDL 125.51  LDL cholesterol, direct  Result Value Ref Range   Direct LDL 96.0 mg/dL   DG Chest 2 View CLINICAL DATA:  Preoperative evaluation.  EXAM: CHEST - 2 VIEW  COMPARISON:  09/18/2012  FINDINGS: The cardiomediastinal contours are normal. The lungs are clear. Pulmonary vasculature is normal. No consolidation, pleural effusion, or pneumothorax. Thoracic spondylosis with spurring. Degenerative change of the right first rib costochondral junction no acute osseous abnormalities are seen.  IMPRESSION: No acute chest findings.  Electronically Signed   By: Keith Rake M.D.   On: 09/21/2022 20:59   EKG - NSR rate 60, LAD with LAFB as well as RBBB (chronic), 1st degree AV block, no hypertrophy or acute ST/T changes.   Assessment & Plan:  I spent 45 minutes caring for this patient today face to face, reviewing labs, prior records from another facility, performing a  medically appropriate examination and/or evaluation, counseling and educating the patient on preventative healthcare, documenting in the record and arranging for pre-operative evaluation for upcoming L knee revision of prior replacement.   Problem List Items Addressed This Visit     Essential hypertension (Chronic)    Chronic, stable on current regimen of lisinopril '5mg'$  daily.       Relevant Medications   lisinopril (ZESTRIL) 5 MG tablet   Hyperlipidemia with target LDL less than 100 (Chronic)    Stable period off statin. Goal LDL <100.  The 10-year ASCVD risk score (Arnett DK, et al., 2019) is: 47.4%   Values used to calculate the score:     Age: 42 years     Sex: Male     Is Non-Hispanic African American: No     Diabetic: Yes     Tobacco smoker: No     Systolic Blood Pressure: 664 mmHg     Is BP treated: Yes     HDL Cholesterol: 26.7 mg/dL     Total Cholesterol: 152 mg/dL       Relevant Medications   lisinopril (ZESTRIL) 5 MG tablet   Bifascicular block (Chronic)    Bifascicular block (RBBB, LAFB) with 1st degree AV block. Pt asxs. Echocardiogram from 11/2021 was reassuring.  Saw cardiology Dr Ellyn Hack last year, rec avoiding AV nodal blocking agents.  Will defer cardiac clearance to cardiologist.       Relevant Medications   lisinopril (ZESTRIL) 5 MG tablet   Ascending aorta dilatation (HCC) (Chronic)    Mild, this is followed by cardiology.       Relevant Medications   lisinopril (ZESTRIL) 5 MG tablet   Advanced directives, counseling/discussion (Chronic)    Previously discussed      Gout    Chronic, stable period on allopurinol '300mg'$  daily without recent gout flare. Urate level reassuringly 6 - rec continue allopurinol at current dose.       Prediabetes    Encouraged limiting added sugars, sweetened beverages and working on weight loss.       Obesity, Class I, BMI 30-34.9    Continue to encourage healthy diet and lifestyle choices to affect sustainable weight  loss.  Activity currently limited by L knee pain.       Osteoarthritis, multiple sites   Relevant Medications   allopurinol (ZYLOPRIM) 300 MG tablet   OSA (obstructive sleep apnea)    OSA on CPAP followed by ENT Dr Constance Holster last seen 03/2021.  Last pressure setting was AutoPAP 5-15 mmHg.  Continue nightly CPAP.       Small vessel disease, cerebrovascular    He  stopped aspirin due to easy bruising/bleeding. He has declined statin. Will work towards good blood pressure control.       Relevant Medications   lisinopril (ZESTRIL) 5 MG tablet   Pre-op evaluation - Primary    RCRI = 0 Check EKG and CXR today Recent labs reassuring. Anticipate adequately low risk to proceed with planned surgical intervention from a medical standpoint. Will defer cardiac clearance to cardiologist.      Relevant Orders   DG Chest 2 View (Completed)   EKG 12-Lead (Completed)   Painful total knee replacement, left (HCC)    States found to have loose hardware s/p knee joint aspirations by ortho, now planned knee replacement revision at end of month. See below.       RESOLVED: Anemia    Latest Hgb stable - will resolve this problem.         Meds ordered this encounter  Medications   allopurinol (ZYLOPRIM) 300 MG tablet    Sig: Take 1 tablet (300 mg total) by mouth daily.    Dispense:  90 tablet    Refill:  4   lisinopril (ZESTRIL) 5 MG tablet    Sig: Take 1 tablet (5 mg total) by mouth daily.    Dispense:  90 tablet    Refill:  4   gabapentin (NEURONTIN) 300 MG capsule    Sig: Take 1 capsule (300 mg total) by mouth at bedtime as needed (Nerve pan).    Dispense:  90 capsule    Refill:  4    Orders Placed This Encounter  Procedures   DG Chest 2 View    Standing Status:   Future    Number of Occurrences:   1    Standing Expiration Date:   09/22/2023    Order Specific Question:   Reason for Exam (SYMPTOM  OR DIAGNOSIS REQUIRED)    Answer:   preop eval    Order Specific Question:   Preferred  imaging location?    Answer:   [place]   EKG 12-Lead    Patient Instructions  Update EKG and chest xray today for possible pre-operative evaluation.  Continue current medicines.  Work on low cholesterol diet, limit sugars in diet to keep prediabetes under control.  Good to see you today Return as needed or in 1 year for next wellness visit.   Follow up plan: Return in about 1 year (around 09/22/2023) for medicare wellness visit.  [name], MD

## 2022-09-21 NOTE — Patient Instructions (Addendum)
Update EKG and chest xray today for possible pre-operative evaluation.  Continue current medicines.  Work on low cholesterol diet, limit sugars in diet to keep prediabetes under control.  Good to see you today Return as needed or in 1 year for next wellness visit.

## 2022-09-21 NOTE — Assessment & Plan Note (Signed)
Chronic, stable on current regimen.  

## 2022-09-21 NOTE — Assessment & Plan Note (Signed)
Mild, this is followed by cardiology.

## 2022-09-21 NOTE — Assessment & Plan Note (Signed)
RCRI = 0 Check EKG and CXR today Recent labs reassuring. Anticipate adequately low risk to proceed with planned surgical intervention from a medical standpoint. Will defer cardiac clearance to cardiologist.

## 2022-09-21 NOTE — Assessment & Plan Note (Addendum)
Stable period off statin. Goal LDL <100.  The 10-year ASCVD risk score (Arnett DK, et al., 2019) is: 47.4%   Values used to calculate the score:     Age: 72 years     Sex: Male     Is Non-Hispanic African American: No     Diabetic: Yes     Tobacco smoker: No     Systolic Blood Pressure: 003 mmHg     Is BP treated: Yes     HDL Cholesterol: 26.7 mg/dL     Total Cholesterol: 152 mg/dL

## 2022-09-22 ENCOUNTER — Telehealth: Payer: Self-pay | Admitting: *Deleted

## 2022-09-22 NOTE — Telephone Encounter (Signed)
   Pre-operative Risk Assessment    Patient Name: Ronald Bullock.  DOB: [birth year] MRN: [id number]      Request for Surgical Clearance    Procedure:   LEFT KNEE ARTHROPLASTY   Date of Surgery:  Clearance TBD                                 Surgeon:  Hessie Dibble, MD Surgeon's Group or Practice Name:  [facility] Phone number:  [phone] Fax number:  [phone]   Type of Clearance Requested:   - Medical    Type of Anesthesia:  Spinal   Additional requests/questions:    SignedJeanann Lewandowsky   09/22/2022, 10:02 AM

## 2022-09-22 NOTE — Telephone Encounter (Signed)
   Name: Ronald Bullock.  DOB: [birth year]  MRN: [id number]  Primary Cardiologist: Glenetta Hew, MD   Preoperative team, please contact this patient and set up a phone call appointment for further preoperative risk assessment. Please obtain consent and complete medication review. Thank you for your help.  I confirm that guidance regarding antiplatelet and oral anticoagulation therapy has been completed and, if necessary, noted below (none requested).    Lenna Sciara, NP 09/22/2022, 12:32 PM [facility]

## 2022-09-23 ENCOUNTER — Telehealth: Payer: Self-pay | Admitting: *Deleted

## 2022-09-23 ENCOUNTER — Encounter: Payer: Self-pay | Admitting: Family Medicine

## 2022-09-23 DIAGNOSIS — T8484XA Pain due to internal orthopedic prosthetic devices, implants and grafts, initial encounter: Secondary | ICD-10-CM | POA: Insufficient documentation

## 2022-09-23 DIAGNOSIS — Z96652 Presence of left artificial knee joint: Secondary | ICD-10-CM | POA: Insufficient documentation

## 2022-09-23 MED ORDER — GABAPENTIN 300 MG PO CAPS: 300.0000 mg | ORAL_CAPSULE | Freq: Every evening | ORAL | 4 refills | Status: DC | PRN

## 2022-09-23 NOTE — Assessment & Plan Note (Addendum)
OSA on CPAP followed by ENT Dr Constance Holster last seen 03/2021.  Last pressure setting was AutoPAP 5-15 mmHg.  Continue nightly CPAP.

## 2022-09-23 NOTE — Telephone Encounter (Signed)
1st attempt to reach pt regarding surgical clearance and the need for a tele visit.  Left a message for pt to call back and ask for the preop team. 

## 2022-09-23 NOTE — Telephone Encounter (Signed)
Pt called back and has been scheduled for tele pre op appt 09/29/22 @ 10 am. Med rec and consent are done.      Patient Consent for Virtual Visit        Ronald Bullock. has provided verbal consent on 09/23/2022 for a virtual visit (video or telephone).   CONSENT FOR VIRTUAL VISIT FOR:  Ronald Bullock.  By participating in this virtual visit I agree to the following:  I hereby voluntarily request, consent and authorize [facility] and its employed or contracted physicians, physician assistants, nurse practitioners or other licensed health care professionals (the Practitioner), to provide me with telemedicine health care services (the "Services") as deemed necessary by the treating Practitioner. I acknowledge and consent to receive the Services by the Practitioner via telemedicine. I understand that the telemedicine visit will involve communicating with the Practitioner through live audiovisual communication technology and the disclosure of certain medical information by electronic transmission. I acknowledge that I have been given the opportunity to request an in-person assessment or other available alternative prior to the telemedicine visit and am voluntarily participating in the telemedicine visit.  I understand that I have the right to withhold or withdraw my consent to the use of telemedicine in the course of my care at any time, without affecting my right to future care or treatment, and that the Practitioner or I may terminate the telemedicine visit at any time. I understand that I have the right to inspect all information obtained and/or recorded in the course of the telemedicine visit and may receive copies of available information for a reasonable fee.  I understand that some of the potential risks of receiving the Services via telemedicine include:  Delay or interruption in medical evaluation due to technological equipment failure or disruption; Information  transmitted may not be sufficient (e.g. poor resolution of images) to allow for appropriate medical decision making by the Practitioner; and/or  In rare instances, security protocols could fail, causing a breach of personal health information.  Furthermore, I acknowledge that it is my responsibility to provide information about my medical history, conditions and care that is complete and accurate to the best of my ability. I acknowledge that Practitioner's advice, recommendations, and/or decision may be based on factors not within their control, such as incomplete or inaccurate data provided by me or distortions of diagnostic images or specimens that may result from electronic transmissions. I understand that the practice of medicine is not an exact science and that Practitioner makes no warranties or guarantees regarding treatment outcomes. I acknowledge that a copy of this consent can be made available to me via my patient portal ([facility]), or I can request a printed copy by calling the office of [facility].    I understand that my insurance will be billed for this visit.   I have read or had this consent read to me. I understand the contents of this consent, which adequately explains the benefits and risks of the Services being provided via telemedicine.  I have been provided ample opportunity to ask questions regarding this consent and the Services and have had my questions answered to my satisfaction. I give my informed consent for the services to be provided through the use of telemedicine in my medical care

## 2022-09-23 NOTE — Assessment & Plan Note (Signed)
States found to have loose hardware s/p knee joint aspirations by ortho, now planned knee replacement revision at end of month. See below.

## 2022-09-23 NOTE — Assessment & Plan Note (Signed)
Latest Hgb stable - will resolve this problem.

## 2022-09-23 NOTE — Assessment & Plan Note (Addendum)
He stopped aspirin due to easy bruising/bleeding. He has declined statin. Will work towards good blood pressure control.

## 2022-09-23 NOTE — Assessment & Plan Note (Addendum)
Continue to encourage healthy diet and lifestyle choices to affect sustainable weight loss.  Activity currently limited by L knee pain.

## 2022-09-23 NOTE — Assessment & Plan Note (Signed)
Encouraged limiting added sugars, sweetened beverages and working on weight loss.

## 2022-09-23 NOTE — Assessment & Plan Note (Addendum)
Chronic, stable period on allopurinol '300mg'$  daily without recent gout flare. Urate level reassuringly 6 - rec continue allopurinol at current dose.

## 2022-09-23 NOTE — Telephone Encounter (Signed)
Pt called back and has been scheduled for tele pre op appt 09/29/22 @ 10 am. Med rec and consent are done

## 2022-09-24 ENCOUNTER — Telehealth: Payer: Self-pay

## 2022-09-24 NOTE — Telephone Encounter (Signed)
Faxed surgical clearance form, OV notes and results to [facility] at [phone], attn: Marlin Canary.

## 2022-09-27 NOTE — Progress Notes (Unsigned)
Virtual Visit via Telephone Note   Because of Ronald NEUSTADT Jr.'s co-morbid illnesses, he is at least at moderate risk for complications without adequate follow up.  This format is felt to be most appropriate for this patient at this time.  The patient did not have access to video technology/had technical difficulties with video requiring transitioning to audio format only (telephone).  All issues noted in this document were discussed and addressed.  No physical exam could be performed with this format.  Please refer to the patient's chart for his consent to telehealth for [facility].  Evaluation Performed:  Preoperative cardiovascular risk assessment _____________   Date:  09/27/2022   Patient ID:  Ronald Bullock., DOB [birth year], MRN [id number] Patient Location:  Home Provider location:   Office  Primary Care Provider:  [name], MD Primary Cardiologist:  Glenetta Hew, MD  Chief Complaint / Patient Profile   72 y.o. y/o male with a h/o HTN, HLD, OSA on CPAP  who is pending left knee arthroplasty and presents today for telephonic preoperative cardiovascular risk assessment.  History of Present Illness    Ronald Bullock. is a 72 y.o. male who presents via audio/video conferencing for a telehealth visit today.  Pt was last seen in cardiology clinic on 03/12/2022 by Dr. Ellyn Hack.  At that time Ronald Bullock. was doing well overall with no new cardiac complaints.  He was completing physical therapy and had no complaints of chest pain or shortness of breath..  The patient is now pending procedure as outlined above. Since his last visit, he ***  *** denies chest pain, shortness of breath, lower extremity edema, fatigue, palpitations, melena, hematuria, hemoptysis, diaphoresis, weakness, presyncope, syncope, orthopnea, and PND.   None requested  Past Medical History    Past Medical History:  Diagnosis Date   Arthritis    L end stage  knee DKD, mild R knee DJD, R hip end stage DJD   DDD (degenerative disc disease), cervical    multilevel   Gout    rare flares   HLD (hyperlipidemia)    Low HDL   Hyperglycemia    Hypertension    Lumbar spinal stenosis 11/15/2014   severe central canal stenosis L3/4;L4/5 bilat facet hypertrophy, annular disc bulging, grade 1 sphondylolisthesis ([facility]onski) planned PT and ESI Mina Marble)   Lumbar spinal stenosis    s/p lumbar decompressive surgery 11/2021 (Dumonski)   OSA on CPAP    Osteoarthritis    R knee and hip, s/p L TKR   Past Surgical History:  Procedure Laterality Date   cardiolyte  1997   WNL   carotid US  1997   WNL   CATARACT EXTRACTION W/PHACO Left 12/24/2014   Procedure: CATARACT EXTRACTION PHACO AND INTRAOCULAR LENS PLACEMENT ([facility]);  Surgeon: Birder Robson, MD; [place] 01:16AP% 26.4Cde 20.17   COLONOSCOPY  1998   WNL   COLONOSCOPY  06/2010   WNL, internal hemorrhoids, rec rpt 10 yrs   COLONOSCOPY  07/2021   TAx3, HPx1, int hem, rpt 5 yrs [name])   CT HEAD LIMITED W/CM  1997   WNL   ESOPHAGOGASTRODUODENOSCOPY  1998   gastric polyps, benign   JOINT REPLACEMENT  1968   Left Knee, cartilage removed   KNEE ARTHROSCOPY  1968   cartilage removal   LUMBAR EPIDURAL INJECTION  02/2017   L3-4 interlaminar injection Mina Marble)   MRI lumbar  2000   mild bulge L3/4, mild foraminal narrowing L4/5, L5/S2, small disk  herniation   MRI lumbar  11/2014   severe L3/4, L4/5 central canal stenosis (Dumonski)   SHOULDER SURGERY Right 05/2019   [facility]   TOTAL HIP ARTHROPLASTY Right 03/27/2013   TOTAL HIP ARTHROPLASTY ANTERIOR APPROACH;  Surgeon: Hessie Dibble, MD   TOTAL KNEE ARTHROPLASTY Left 09/26/2012   TOTAL KNEE ARTHROPLASTY;  Surgeon: Hessie Dibble, MD   TRANSFORAMINAL LUMBAR INTERBODY FUSION (TLIF) WITH PEDICLE SCREW FIXATION 2 LEVEL Left 12/09/2021   Procedure: LEFT-SIDED LUMBAR 3- LUMBAR 4, LUMBAR 4- LUMBAR 5 TRANSFORAMINAL INTERBODY FUSION AND DECOMPRESSION WITH  INSTRUMENTATION AND ALLOGRAFT;  Surgeon: Phylliss Bob, MD;  Location: [facility];  Service: Orthopedics;  Laterality: Left;   TRANSTHORACIC ECHOCARDIOGRAM  12/08/2021   Normal LV size and function.  EF 55-60 %.  No RWMA.  Mild asymmetric basal septal LVH.  Normal diastolic function.  Mild RV dilation with normal function.  Normal MV, mild AOV calcification-sclerosis but no stenosis.    Allergies  Allergies  Allergen Reactions   Meloxicam Other (See Comments)    Peripheral edema   Omeprazole     Vomiting/diarrhea on BID dosing    Home Medications    Prior to Admission medications   Medication Sig Start Date End Date Taking? Authorizing Provider  allopurinol (ZYLOPRIM) 300 MG tablet Take 1 tablet (300 mg total) by mouth daily. 09/21/22   [name], MD  gabapentin (NEURONTIN) 300 MG capsule Take 1 capsule (300 mg total) by mouth at bedtime as needed (Nerve pan). 09/23/22   [name], MD  lisinopril (ZESTRIL) 5 MG tablet Take 1 tablet (5 mg total) by mouth daily. 09/21/22   [name], MD  Multiple Vitamins-Minerals (CENTRUM SILVER 50+MEN PO) Take 1 tablet by mouth daily.    [provider]  Omega-3 Fatty Acids (FISH OIL) 1000 MG CAPS Take 2 capsules by mouth daily.    [provider]  OVER THE COUNTER MEDICATION Place 1 spray into both nostrils at bedtime as needed (Decongestant). Nasal decongestant Oxymetazoline 0.5%    [provider]  Respiratory Therapy Supplies (CARETOUCH CPAP & BIPAP HOSE) MISC by Does not apply route.    [provider]    Physical Exam    Vital Signs:  Ronald Bullock. does not have vital signs available for review today.***  Given telephonic nature of communication, physical exam is limited. AAOx3. NAD. Normal affect.  Speech and respirations are unlabored.  Accessory Clinical Findings    None  Assessment & Plan    1.  Preoperative Cardiovascular Risk Assessment:  {Click Here to Calculate RCRI       :0000000  { Click Here to Calculate DASI      :[id number] {Select to add RCRI Risk (<1%=LOW; >/=1%=HIGH) (Optional):21036017}  {Select if HIGH (RCRI >/=1%) Risk (Optional):21036030} Recommendations: {2014 ACC/AHA Perioperative Guidelines  :21036001} Antiplatelet and/or Anticoagulation Recommendations: {Antiplatelet Recommendations                  :21036016} {Anticoagulation Recommendations           :[id number]    The patient was advised that if he develops new symptoms prior to surgery to contact our office to arrange for a follow-up visit, and he verbalized understanding.  (Reminder: Include SBE prophylaxis/Antiplatelet/Anticoag Instructions***)  A copy of this note will be routed to requesting surgeon.  Time:   Today, I have spent *** minutes with the patient with telehealth technology discussing medical history, symptoms, and management plan.     Mable Fill, Marissa Nestle,  NP  09/27/2022, 1:10 PM

## 2022-09-29 ENCOUNTER — Ambulatory Visit: Payer: Medicare Other | Attending: Internal Medicine

## 2022-09-29 DIAGNOSIS — Z01810 Encounter for preprocedural cardiovascular examination: Secondary | ICD-10-CM

## 2022-10-28 ENCOUNTER — Other Ambulatory Visit: Payer: Self-pay | Admitting: Orthopaedic Surgery

## 2022-11-15 ENCOUNTER — Encounter (HOSPITAL_COMMUNITY): Payer: Self-pay

## 2022-11-15 NOTE — Progress Notes (Signed)
COVID Vaccine Completed:  Yes  Date of COVID positive in last 90 days:  PCP - [name], MD Cardiologist - Glenetta Hew, MD  Cardiac clearance in Epic dated 09-27-22 by Ambrose Pancoast, NP  Medical clearance in note dated 09-21-22 by Dr. Danise Mina  Chest x-ray - 09-21-22 Epic EKG - 09-21-22 Epic Stress Test -  ECHO -  Cardiac Cath -  Pacemaker/ICD device last checked: Spinal Cord Stimulator:  Bowel Prep -   Sleep Study - Yes, +sleep apnea CPAP -   Prediabetes Fasting Blood Sugar -  Checks Blood Sugar _____ times a day  Last dose of GLP1 agonist-  N/A GLP1 instructions:  N/A   Last dose of SGLT-2 inhibitors-  N/A SGLT-2 instructions: N/A   Blood Thinner Instructions: Aspirin Instructions: Last Dose:  Activity level:  Can go up a flight of stairs and perform activities of daily living without stopping and without symptoms of chest pain or shortness of breath.  Able to exercise without symptoms  Unable to go up a flight of stairs without symptoms of     Anesthesia review:  Bifascicular block, ascending aorta dilation, RBBB, HTN, OSA  Patient denies shortness of breath, fever, cough and chest pain at PAT appointment  Patient verbalized understanding of instructions that were given to them at the PAT appointment. Patient was also instructed that they will need to review over the PAT instructions again at home before surgery.

## 2022-11-15 NOTE — Patient Instructions (Signed)
SURGICAL WAITING ROOM VISITATION Patients having surgery or a procedure may have no more than 2 support people in the waiting area - these visitors may rotate.    Children under the age of 36 must have an adult with them who is not the patient.  If the patient needs to stay at the hospital during part of their recovery, the visitor guidelines for inpatient rooms apply. Pre-op nurse will coordinate an appropriate time for 1 support person to accompany patient in pre-op.  This support person may not rotate.    Please refer to the [facility] website for the visitor guidelines for Inpatients (after your surgery is over and you are in a regular room).     Your procedure is scheduled on: 11-30-22   Report to [facility] Main Entrance    Report to admitting at 5:15 AM   Call this number if you have problems the morning of surgery [phone]   Do not eat food :After Midnight.   After Midnight you may have the following liquids until 4:30 AM DAY OF SURGERY  Water Non-Citrus Juices (without pulp, NO RED) Carbonated Beverages Black Coffee (NO MILK/CREAM OR CREAMERS, sugar ok)  Clear Tea (NO MILK/CREAM OR CREAMERS, sugar ok) regular and decaf                             Plain Jell-O (NO RED)                                           Fruit ices (not with fruit pulp, NO RED)                                     Popsicles (NO RED)                                                               Sports drinks like Gatorade (NO RED)                   The day of surgery:  Drink ONE (1) Pre-Surgery G2 at 4:30 AM the morning of surgery. Drink in one sitting. Do not sip.  This drink was given to you during your hospital  pre-op appointment visit. Nothing else to drink after completing the Pre-Surgery G2.          If you have questions, please contact your surgeon's office.   FOLLOW  ANY ADDITIONAL PRE OP INSTRUCTIONS YOU RECEIVED FROM YOUR SURGEON'S OFFICE!!!     Oral Hygiene is also  important to reduce your risk of infection.                                    Remember - BRUSH YOUR TEETH THE MORNING OF SURGERY WITH YOUR REGULAR TOOTHPASTE   Do NOT smoke after Midnight   Take these medicines the morning of surgery with A SIP OF WATER:   Allopurinol  Okay to use nsal spray  Bring CPAP mask  and tubing day of surgery.                              You may not have any metal on your body including jewelry, and body piercing             Do not wear lotions, powders, cologne, or deodorant              Men may shave face and neck.   Do not bring valuables to the hospital. [place].   Contacts, dentures or bridgework may not be worn into surgery.   Bring small overnight bag day of surgery.   DO NOT [place]. PHARMACY WILL DISPENSE MEDICATIONS LISTED ON YOUR MEDICATION LIST TO YOU DURING YOUR ADMISSION [place]!   Special Instructions: Bring a copy of your healthcare power of attorney and living will documents the day of surgery if you haven't scanned them before.              Please read over the following fact sheets you were given: IF [place] G[place]  If you received a COVID test during your pre-op visit  it is requested that you wear a mask when out in public, stay away from anyone that may not be feeling well and notify your surgeon if you develop symptoms. If you test positive for Covid or have been in contact with anyone that has tested positive in the last 10 days please notify you surgeon.   Incentive Spirometer  An incentive spirometer is a tool that can help keep your lungs clear and active. This tool measures how well you are filling your lungs with each breath. Taking long deep breaths may help reverse or decrease the chance of developing breathing (pulmonary) problems (especially infection) following: A long period  of time when you are unable to move or be active. BEFORE THE PROCEDURE  If the spirometer includes an indicator to show your best effort, your nurse or respiratory therapist will set it to a desired goal. If possible, sit up straight or lean slightly forward. Try not to slouch. Hold the incentive spirometer in an upright position. INSTRUCTIONS FOR USE  Sit on the edge of your bed if possible, or sit up as far as you can in bed or on a chair. Hold the incentive spirometer in an upright position. Breathe out normally. Place the mouthpiece in your mouth and seal your lips tightly around it. Breathe in slowly and as deeply as possible, raising the piston or the ball toward the top of the column. Hold your breath for 3-5 seconds or for as long as possible. Allow the piston or ball to fall to the bottom of the column. Remove the mouthpiece from your mouth and breathe out normally. Rest for a few seconds and repeat Steps 1 through 7 at least 10 times every 1-2 hours when you are awake. Take your time and take a few normal breaths between deep breaths. The spirometer may include an indicator to show your best effort. Use the indicator as a goal to work toward during each repetition. After each set of 10 deep breaths, practice coughing to be sure your lungs are clear. If you have an incision (the cut made at the time of surgery), support your incision when coughing by placing a pillow  or rolled up towels firmly against it. Once you are able to get out of bed, walk around indoors and cough well. You may stop using the incentive spirometer when instructed by your caregiver.  RISKS AND COMPLICATIONS Take your time so you do not get dizzy or light-headed. If you are in pain, you may need to take or ask for pain medication before doing incentive spirometry. It is harder to take a deep breath if you are having pain. AFTER USE Rest and breathe slowly and easily. It can be helpful to keep track of a log of your  progress. Your caregiver can provide you with a simple table to help with this. If you are using the spirometer at home, follow these instructions: [place] IF:  You are having difficultly using the spirometer. You have trouble using the spirometer as often as instructed. Your pain medication is not giving enough relief while using the spirometer. You develop fever of 100.5 F (38.1 C) or higher. SEEK IMMEDIATE MEDICAL CARE IF:  You cough up bloody sputum that had not been present before. You develop fever of 102 F (38.9 C) or greater. You develop worsening pain at or near the incision site. MAKE SURE YOU:  Understand these instructions. Will watch your condition. Will get help right away if you are not doing well or get worse. Document Released: 12/13/2006 Document Revised: 10/25/2011 Document Reviewed: 02/13/2007 ExitCare Patient Information 2014 Exit[employer], [place].   ________________________________________________________________________ WHAT IS A BLOOD TRANSFUSION? Blood Transfusion Information  A transfusion is the replacement of blood or some of its parts. Blood is made up of multiple cells which provide different functions. Red blood cells carry oxygen and are used for blood loss replacement. White blood cells fight against infection. Platelets control bleeding. Plasma helps clot blood. Other blood products are available for specialized needs, such as hemophilia or other clotting disorders. BEFORE THE TRANSFUSION  Who gives blood for transfusions?  Healthy volunteers who are fully evaluated to make sure their blood is safe. This is blood bank blood. Transfusion therapy is the safest it has ever been in the practice of medicine. Before blood is taken from a donor, a complete history is taken to make sure that person has no history of diseases nor engages in risky social behavior (examples are intravenous drug use or sexual activity with multiple partners). The donor's  travel history is screened to minimize risk of transmitting infections, such as malaria. The donated blood is tested for signs of infectious diseases, such as HIV and hepatitis. The blood is then tested to be sure it is compatible with you in order to minimize the chance of a transfusion reaction. If you or a relative donates blood, this is often done in anticipation of surgery and is not appropriate for emergency situations. It takes many days to process the donated blood. RISKS AND COMPLICATIONS Although transfusion therapy is very safe and saves many lives, the main dangers of transfusion include:  Getting an infectious disease. Developing a transfusion reaction. This is an allergic reaction to something in the blood you were given. Every precaution is taken to prevent this. The decision to have a blood transfusion has been considered carefully by your caregiver before blood is given. Blood is not given unless the benefits outweigh the risks. AFTER THE TRANSFUSION Right after receiving a blood transfusion, you will usually feel much better and more energetic. This is especially true if your red blood cells have gotten low (anemic). The transfusion raises the  level of the red blood cells which carry oxygen, and this usually causes an energy increase. The nurse administering the transfusion will monitor you carefully for complications. HOME CARE INSTRUCTIONS  No special instructions are needed after a transfusion. You may find your energy is better. Speak with your caregiver about any limitations on activity for underlying diseases you may have. SEEK MEDICAL CARE IF:  Your condition is not improving after your transfusion. You develop redness or irritation at the intravenous (IV) site. SEEK IMMEDIATE MEDICAL CARE IF:  Any of the following symptoms occur over the next 12 hours: Shaking chills. You have a temperature by mouth above 102 F (38.9 C), not controlled by medicine. Chest, back, or muscle  pain. People around you feel you are not acting correctly or are confused. Shortness of breath or difficulty breathing. Dizziness and fainting. You get a rash or develop hives. You have a decrease in urine output. Your urine turns a dark color or changes to pink, red, or brown. Any of the following symptoms occur over the next 10 days: You have a temperature by mouth above 102 F (38.9 C), not controlled by medicine. Shortness of breath. Weakness after normal activity. The white part of the eye turns yellow (jaundice). You have a decrease in the amount of urine or are urinating less often. Your urine turns a dark color or changes to pink, red, or brown. Document Released: 07/30/2000 Document Revised: 10/25/2011 Document Reviewed: 03/18/2008 [facility] Patient Information 2014 [place], [place].  _______________________________________________________________________

## 2022-11-16 ENCOUNTER — Other Ambulatory Visit: Payer: Self-pay | Admitting: Family Medicine

## 2022-11-16 NOTE — Telephone Encounter (Signed)
Too soon. Rx sent 09/21/22, #90/4 to [facility].  Request denied.

## 2022-11-18 ENCOUNTER — Encounter (HOSPITAL_COMMUNITY): Payer: Self-pay

## 2022-11-18 ENCOUNTER — Encounter (HOSPITAL_COMMUNITY)
Admission: RE | Admit: 2022-11-18 | Discharge: 2022-11-18 | Disposition: A | Discharge status: Home / Self Care | Payer: Medicare Other | Source: Ambulatory Visit | Attending: Orthopaedic Surgery | Admitting: Orthopaedic Surgery

## 2022-11-18 ENCOUNTER — Other Ambulatory Visit: Payer: Self-pay

## 2022-11-18 VITALS — BP 148/66 | HR 66 | Temp 97.7°F | Resp 16 | Ht 71.75 in | Wt 223.2 lb

## 2022-11-18 DIAGNOSIS — I10 Essential (primary) hypertension: Secondary | ICD-10-CM | POA: Insufficient documentation

## 2022-11-18 DIAGNOSIS — G4733 Obstructive sleep apnea (adult) (pediatric): Secondary | ICD-10-CM | POA: Diagnosis not present

## 2022-11-18 DIAGNOSIS — E785 Hyperlipidemia, unspecified: Secondary | ICD-10-CM | POA: Diagnosis not present

## 2022-11-18 DIAGNOSIS — Z01818 Encounter for other preprocedural examination: Secondary | ICD-10-CM | POA: Diagnosis not present

## 2022-11-18 DIAGNOSIS — M1611 Unilateral primary osteoarthritis, right hip: Secondary | ICD-10-CM | POA: Insufficient documentation

## 2022-11-18 DIAGNOSIS — M170 Bilateral primary osteoarthritis of knee: Secondary | ICD-10-CM | POA: Insufficient documentation

## 2022-11-18 DIAGNOSIS — M109 Gout, unspecified: Secondary | ICD-10-CM | POA: Diagnosis not present

## 2022-11-18 DIAGNOSIS — R7303 Prediabetes: Secondary | ICD-10-CM

## 2022-11-18 DIAGNOSIS — Z96652 Presence of left artificial knee joint: Secondary | ICD-10-CM | POA: Diagnosis not present

## 2022-11-18 DIAGNOSIS — Z8719 Personal history of other diseases of the digestive system: Secondary | ICD-10-CM | POA: Insufficient documentation

## 2022-11-18 HISTORY — DX: Prediabetes: R73.03

## 2022-11-18 LAB — CBC
HCT: 40.2 % (ref 39.0–52.0)
Hemoglobin: 13 g/dL (ref 13.0–17.0)
MCH: 30.2 pg (ref 26.0–34.0)
MCHC: 32.3 g/dL (ref 30.0–36.0)
MCV: 93.3 fL (ref 80.0–100.0)
Platelets: 180 10*3/uL (ref 150–400)
RBC: 4.31 MIL/uL (ref 4.22–5.81)
RDW: 14 % (ref 11.5–15.5)
WBC: 6.6 10*3/uL (ref 4.0–10.5)
nRBC: 0 % (ref 0.0–0.2)

## 2022-11-18 LAB — BASIC METABOLIC PANEL
Anion gap: 7 (ref 5–15)
BUN: 22 mg/dL (ref 8–23)
CO2: 25 mmol/L (ref 22–32)
Calcium: 8.7 mg/dL — ABNORMAL LOW (ref 8.9–10.3)
Chloride: 104 mmol/L (ref 98–111)
Creatinine, Ser: 0.93 mg/dL (ref 0.61–1.24)
GFR, Estimated: >60 mL/min (ref >60)
Glucose, Bld: 124 mg/dL — ABNORMAL HIGH (ref 70–99)
Potassium: 4.2 mmol/L (ref 3.5–5.1)
Sodium: 136 mmol/L (ref 135–145)

## 2022-11-18 LAB — GLUCOSE, CAPILLARY: Glucose-Capillary: 136 mg/dL — ABNORMAL HIGH (ref 70–99)

## 2022-11-18 LAB — TYPE AND SCREEN
ABO/RH(D): B POS
Antibody Screen: NEGATIVE

## 2022-11-18 LAB — HEMOGLOBIN A1C
Hgb A1c MFr Bld: 6.3 % — ABNORMAL HIGH (ref 4.8–5.6)
Mean Plasma Glucose: 134 mg/dL

## 2022-11-18 LAB — SURGICAL PCR SCREEN
MRSA, PCR: NEGATIVE
Staphylococcus aureus: POSITIVE — AB

## 2022-11-18 NOTE — Progress Notes (Signed)
PCR results sent to Dr. Dalldorf to review. ?

## 2022-11-19 NOTE — Progress Notes (Signed)
Anesthesia Chart Review   Case: [id number] Date/Time: 11/30/22 0715   Procedure: LEFT TOTAL KNEE REVISION (Left: Knee)   Anesthesia type: Spinal   Pre-op diagnosis: LOOSE LEFT KNEE REPLACEMENT   [facility]   Surgeons: Marcene Corningalldorf, Peter, MD       DISCUSSION:71 y.o. never smoker with h/o HTN, OSA on CPAP, loose left knee replacement scheduled for above procedure 11/30/2022 with Dr. Marcene CorningPeter Dalldorf.   Per cardiology preoperative evaluation 09/29/2022, "Mr. Pruden's perioperative risk of a major cardiac event is 0.9% according to the Revised Cardiac Risk Index (RCRI).  Therefore, he is at low risk for perioperative complications.   His functional capacity is fair at 4.31 METs according to the Duke Activity Status Index (DASI).   Recommendations: According to ACC/AHA guidelines, no further cardiovascular testing needed.  The patient may proceed to surgery at acceptable risk.   Antiplatelet and/or Anticoagulation Recommendations:"  Anticipate pt can proceed with planned procedure barring acute status change.   VS: BP (!) 148/66   Pulse 66   Temp 36.5 C (Oral)   Resp 16   Ht 5' 11.75" (1.822 m)   Wt 101.2 kg   SpO2 100%   BMI 30.48 kg/m   PROVIDERS: Eustaquio BoydenGutierrez, Javier, MD is PCP   Primary Cardiologist:  Bryan Lemmaavid Harding, MD   LABS: Labs reviewed: Acceptable for surgery. (all labs ordered are listed, but only abnormal results are displayed)  Labs Reviewed  SURGICAL PCR SCREEN - Abnormal; Notable for the following components:      Result Value   Staphylococcus aureus POSITIVE (*)    All other components within normal limits  HEMOGLOBIN A1C - Abnormal; Notable for the following components:   Hgb A1c MFr Bld 6.3 (*)    All other components within normal limits  BASIC METABOLIC PANEL - Abnormal; Notable for the following components:   Glucose, Bld 124 (*)    Calcium 8.7 (*)    All other components within normal limits  GLUCOSE, CAPILLARY - Abnormal; Notable for the  following components:   Glucose-Capillary 136 (*)    All other components within normal limits  CBC  TYPE AND SCREEN     IMAGES:   EKG:   CV: Echo 12/08/2021  1. Left ventricular ejection fraction, by estimation, is 55 to 60%. Left  ventricular ejection fraction by 3D volume is 56 %. The left ventricle has  normal function. The left ventricle has no regional wall motion  abnormalities. There is mild asymmetric  left ventricular hypertrophy of the basal-septal segment. Left ventricular  diastolic parameters were normal. The average left ventricular global  longitudinal strain is -21.5 %.   2. Right ventricular systolic function is normal. The right ventricular  size is mildly enlarged.   3. The mitral valve is normal in structure. Trivial mitral valve  regurgitation.   4. The aortic valve is tricuspid. Aortic valve regurgitation is not  visualized. Aortic valve sclerosis/calcification is present, without any  evidence of aortic stenosis.   5. Aortic dilatation noted. There is mild dilatation of the ascending  aorta, measuring 38 mm.  Past Medical History:  Diagnosis Date   Arthritis    L end stage knee DKD, mild R knee DJD, R hip end stage DJD   DDD (degenerative disc disease), cervical    multilevel   Gout    rare flares   HLD (hyperlipidemia)    Low HDL   Hyperglycemia    Hypertension    Lumbar spinal stenosis  11/15/2014   severe central canal stenosis L3/4;L4/5 bilat facet hypertrophy, annular disc bulging, grade 1 sphondylolisthesis (Dumonski) planned PT and ESI (Wang)   Lumbar spinal stenosis    s/p lumbar decompressive surgery 11/2021 (Dumonski)   OSA on CPAP    Osteoarthritis    R knee and hip, s/p L TKR   Pre-diabetes     Past Surgical History:  Procedure Laterality Date   cardiolyte  1997   WNL   carotid US  1997   WNL   CATARACT EXTRACTION W/PHACO Left 12/24/2014   Procedure: CATARACT EXTRACTION PHACO AND INTRAOCULAR LENS PLACEMENT (IOC);  Surgeon:  Galen Manila, MD; [place] 01:16AP% 26.4Cde 20.17   COLONOSCOPY  1998   WNL   COLONOSCOPY  06/2010   WNL, internal hemorrhoids, rec rpt 10 yrs   COLONOSCOPY  07/2021   TAx3, HPx1, int hem, rpt 5 yrs Marina Goodell)   CT HEAD LIMITED W/CM  1997   WNL   ESOPHAGOGASTRODUODENOSCOPY  1998   gastric polyps, benign   JOINT REPLACEMENT  1968   Left Knee, cartilage removed   KNEE ARTHROSCOPY  1968   cartilage removal   LUMBAR EPIDURAL INJECTION  02/2017   L3-4 interlaminar injection Regino Schultze)   MRI lumbar  2000   mild bulge L3/4, mild foraminal narrowing L4/5, L5/S2, small disk herniation   MRI lumbar  11/2014   severe L3/4, L4/5 central canal stenosis (Dumonski)   SHOULDER SURGERY Right 05/2019   [place]   TOTAL HIP ARTHROPLASTY Right 03/27/2013   TOTAL HIP ARTHROPLASTY ANTERIOR APPROACH;  Surgeon: Velna Ochs, MD   TOTAL KNEE ARTHROPLASTY Left 09/26/2012   TOTAL KNEE ARTHROPLASTY;  Surgeon: Velna Ochs, MD   TRANSFORAMINAL LUMBAR INTERBODY FUSION (TLIF) WITH PEDICLE SCREW FIXATION 2 LEVEL Left 12/09/2021   Procedure: LEFT-SIDED LUMBAR 3- LUMBAR 4, LUMBAR 4- LUMBAR 5 TRANSFORAMINAL INTERBODY FUSION AND DECOMPRESSION WITH INSTRUMENTATION AND ALLOGRAFT;  Surgeon: Estill Bamberg, MD;  Location: [facility] OR;  Service: Orthopedics;  Laterality: Left;   TRANSTHORACIC ECHOCARDIOGRAM  12/08/2021   Normal LV size and function.  EF 55-60 %.  No RWMA.  Mild asymmetric basal septal LVH.  Normal diastolic function.  Mild RV dilation with normal function.  Normal MV, mild AOV calcification-sclerosis but no stenosis.    MEDICATIONS:  allopurinol (ZYLOPRIM) 300 MG tablet   gabapentin (NEURONTIN) 300 MG capsule   lisinopril (ZESTRIL) 5 MG tablet   Multiple Vitamins-Minerals (CENTRUM SILVER 50+MEN PO)   NON FORMULARY   Omega 3-6-9 Fatty Acids (OMEGA 3-6-9 PO)   oxymetazoline (AFRIN) 0.05 % nasal spray   Respiratory Therapy Supplies (CARETOUCH CPAP & BIPAP HOSE) MISC   No current facility-administered  medications for this encounter.    Jodell Cipro Ward, PA-C [facility] Pre-Surgical Testing [phone]

## 2022-11-29 MED ORDER — TRANEXAMIC ACID 1000 MG/10ML IV SOLN
2000.0000 mg | INTRAVENOUS | Status: DC
  Filled 2022-11-29: qty 20

## 2022-11-29 NOTE — Anesthesia Preprocedure Evaluation (Addendum)
Anesthesia Evaluation  Patient identified by MRN, date of birth, ID band Patient awake    Reviewed: Allergy & Precautions, NPO status , Patient's Chart, lab work & pertinent test results  Airway Mallampati: II  TM Distance: >3 FB Neck ROM: Full    Dental  (+) Missing, Caps, Chipped,    Pulmonary sleep apnea and Continuous Positive Airway Pressure Ventilation    Pulmonary exam normal breath sounds clear to auscultation       Cardiovascular hypertension, Pt. on medications Normal cardiovascular exam+ dysrhythmias  Rhythm:Regular Rate:Normal  EKG 12/07/21 Right BBB, LAFB  Echo 12/08/21 1. Left ventricular ejection fraction, by estimation, is 55 to 60%. Left  ventricular ejection fraction by 3D volume is 56 %. The left ventricle has normal function. The left ventricle has no regional wall motion abnormalities. There is mild asymmetric left ventricular hypertrophy of the basal-septal segment. Left ventricular diastolic parameters were normal. The average left ventricular global longitudinal strain is -21.5 %.   2. Right ventricular systolic function is normal. The right ventricular size is mildly enlarged.   3. The mitral valve is normal in structure. Trivial mitral valve regurgitation.   4. The aortic valve is tricuspid. Aortic valve regurgitation is not visualized. Aortic valve sclerosis/calcification is present, without any evidence of aortic stenosis.   5. Aortic dilatation noted. There is mild dilatation of the ascending aorta, measuring 38 mm.    Neuro/Psych Ulnar neuropathy right  Neuromuscular disease  negative psych ROS   GI/Hepatic negative GI ROS, Neg liver ROS,,,  Endo/Other  Obesity Gout Hyperlipidemia Hyperglycemia  Renal/GU negative Renal ROS     Musculoskeletal  (+) Arthritis , Osteoarthritis,  Spondylolisthesis L3-4, L4-5 with spinal stenosis and neurogenic claudication   Abdominal  (+) + obese  Peds   Hematology  (+) Blood dyscrasia, anemia   Anesthesia Other Findings   Reproductive/Obstetrics                              Anesthesia Physical Anesthesia Plan  ASA: 3  Anesthesia Plan: Spinal   Post-op Pain Management: Tylenol PO (pre-op)*, Ketamine IV*, Gabapentin PO (pre-op)* and Regional block*   Induction: Intravenous  PONV Risk Score and Plan: 3 and Treatment may vary due to age or medical condition, Ondansetron, Dexamethasone and Propofol infusion  Airway Management Planned: Natural Airway  Additional Equipment: None  Intra-op Plan:   Post-operative Plan:   Informed Consent: I have reviewed the patients History and Physical, chart, labs and discussed the procedure including the risks, benefits and alternatives for the proposed anesthesia with the patient or authorized representative who has indicated his/her understanding and acceptance.     Dental advisory given  Plan Discussed with: CRNA  Anesthesia Plan Comments: (PAT note by Antionette Poles, PA-C: Patient noted to have abnormal EKG preop testing, new bifascicular block.  He is completely asymptomatic from cardiac standpoint.  No known history of CAD.  I reached out to patient's PCP Dr. Sharen Hones to review EKG tracing.  He advised patient should have further evaluation prior to undergoing elective surgery.  He ordered echocardiogram and referred patient to cardiology.  Patient was seen by Dr. Herbie Baltimore 12/07/2021.  Per note, " Referred for cardiology evaluation because of bifascicular block on EKG.  Completely asymptomatic from a cardiac standpoint.  I have seen him today and he has an echocardiogram scheduled for tomorrow.  We reviewed the pathophysiology of bifascicular block in detail, and indicated that this would not  be a contraindication for going for his surgery.  He is not actively having any symptoms of heart block or exercise intolerance/chronotropic incompetence. Indeed there is some  correlation between left anterior fascicular block and potential ischemia, he has not had any ischemic symptoms. Crucially, would avoid any AV nodal agents perioperatively.  Therefore we are not giving his a beta-blocker for preop prophylactic cardioprotection. For now plan will be to await the results of echocardiogram tomorrow.  Provided there is no evidence of significant wall motion abnormality to suggest prior infarct or reduced EF, I do not see any reason for him not to proceed with this procedure on 12/09/2021."  Patient subsequently had echocardiogram 12/08/2021 which was essentially normal.  Dr. Herbie Baltimore commented, "Based on this result, the patient should be cleared to proceed with surgery without any further cardiac evaluation!!".  OSA, noncompliant with CPAP.  Preop labs reviewed, mild anemia with hemoglobin 12.5, otherwise unremarkable.  EKG 12/07/2021: NSR.  Rate 65.  Right bundle branch block.  Left anterior fascicular block.  Minimal voltage criteria for LVH, may be normal variant.  TTE 12/08/2021:  1. Left ventricular ejection fraction, by estimation, is 55 to 60%. Left  ventricular ejection fraction by 3D volume is 56 %. The left ventricle has  normal function. The left ventricle has no regional wall motion  abnormalities. There is mild asymmetric  left ventricular hypertrophy of the basal-septal segment. Left ventricular  diastolic parameters were normal. The average left ventricular global  longitudinal strain is -21.5 %.   2. Right ventricular systolic function is normal. The right ventricular  size is mildly enlarged.   3. The mitral valve is normal in structure. Trivial mitral valve  regurgitation.   4. The aortic valve is tricuspid. Aortic valve regurgitation is not  visualized. Aortic valve sclerosis/calcification is present, without any  evidence of aortic stenosis.   5. Aortic dilatation noted. There is mild dilatation of the ascending  aorta, measuring 38 mm.   )          Anesthesia Quick Evaluation

## 2022-11-29 NOTE — H&P (Signed)
TOTAL KNEE REVISION ADMISSION H&P  Patient is being admitted for left revision total knee arthroplasty.  Subjective:  Chief Complaint:left knee pain.  HPI: Ronald Bullock., 72 y.o. male, has a history of pain and functional disability in the left knee(s) due to failed previous arthroplasty and patient has failed non-surgical conservative treatments for greater than 12 weeks to include NSAID's and/or analgesics, flexibility and strengthening excercises, supervised PT with diminished ADL's post treatment, use of assistive devices, weight reduction as appropriate, and activity modification. The indications for the revision of the total knee arthroplasty are loosening of one or more components. Onset of symptoms was gradual starting 5 years ago with gradually worsening course since that time.  Prior procedures on the left knee(s) include arthroplasty.  Patient currently rates pain in the left knee(s) at 10 out of 10 with activity. There is night pain, worsening of pain with activity and weight bearing, pain that interferes with activities of daily living, and joint swelling.  Patient has evidence of prosthetic loosening by imaging studies. This condition presents safety issues increasing the risk of falls. There is no current active infection.  Patient Active Problem List   Diagnosis Date Noted   Painful total knee replacement, left 09/23/2022   Advanced directives, counseling/discussion 09/21/2022   Right flank pain 05/12/2022   Ascending aorta dilatation 12/09/2021   Radiculopathy 12/09/2021   Pre-op evaluation 12/07/2021   Bifascicular block 12/07/2021   S/P joint replacement 09/16/2021   Small vessel disease, cerebrovascular 05/07/2019   Lumbar stenosis with neurogenic claudication 01/22/2017   Encounter for preoperative assessment for noncoronary cardiac surgery 08/20/2016   Burning mouth syndrome 04/02/2016   Right hand pain 03/08/2014   Ulnar neuropathy at wrist 07/11/2012    Ulnar neuropathy at elbow 07/11/2012   Vertigo 02/28/2012   Right foot pain 07/23/2011   Hyperlipidemia with target LDL less than 100    Osteoarthritis, multiple sites    OSA (obstructive sleep apnea)    Obesity, Class I, BMI 30-34.9 09/30/2010   Gout 09/24/2009   ANKLE PAIN, LEFT 09/03/2008   Essential hypertension 04/25/2007   Prediabetes 04/16/2004   Past Medical History:  Diagnosis Date   Arthritis    L end stage knee DKD, mild R knee DJD, R hip end stage DJD   DDD (degenerative disc disease), cervical    multilevel   Gout    rare flares   HLD (hyperlipidemia)    Low HDL   Hyperglycemia    Hypertension    Lumbar spinal stenosis 11/15/2014   severe central canal stenosis L3/4;L4/5 bilat facet hypertrophy, annular disc bulging, grade 1 sphondylolisthesis (Dumonski) planned PT and ESI (Wang)   Lumbar spinal stenosis    s/p lumbar decompressive surgery 11/2021 (Dumonski)   OSA on CPAP    Osteoarthritis    R knee and hip, s/p L TKR   Pre-diabetes     Past Surgical History:  Procedure Laterality Date   cardiolyte  1997   WNL   carotid US  1997   WNL   CATARACT EXTRACTION W/PHACO Left 12/24/2014   Procedure: CATARACT EXTRACTION PHACO AND INTRAOCULAR LENS PLACEMENT (IOC);  Surgeon: Galen Manila, MD; [place] 01:16AP% 26.4Cde 20.17   COLONOSCOPY  1998   WNL   COLONOSCOPY  06/2010   WNL, internal hemorrhoids, rec rpt 10 yrs   COLONOSCOPY  07/2021   TAx3, HPx1, int hem, rpt 5 yrs Marina Goodell)   CT HEAD LIMITED W/CM  1997   WNL   ESOPHAGOGASTRODUODENOSCOPY  1998  gastric polyps, benign   JOINT REPLACEMENT  1968   Left Knee, cartilage removed   KNEE ARTHROSCOPY  1968   cartilage removal   LUMBAR EPIDURAL INJECTION  02/2017   L3-4 interlaminar injection Regino Schultze)   MRI lumbar  2000   mild bulge L3/4, mild foraminal narrowing L4/5, L5/S2, small disk herniation   MRI lumbar  11/2014   severe L3/4, L4/5 central canal stenosis (Dumonski)   SHOULDER SURGERY Right 05/2019    [facility]   TOTAL HIP ARTHROPLASTY Right 03/27/2013   TOTAL HIP ARTHROPLASTY ANTERIOR APPROACH;  Surgeon: Velna Ochs, MD   TOTAL KNEE ARTHROPLASTY Left 09/26/2012   TOTAL KNEE ARTHROPLASTY;  Surgeon: Velna Ochs, MD   TRANSFORAMINAL LUMBAR INTERBODY FUSION (TLIF) WITH PEDICLE SCREW FIXATION 2 LEVEL Left 12/09/2021   Procedure: LEFT-SIDED LUMBAR 3- LUMBAR 4, LUMBAR 4- LUMBAR 5 TRANSFORAMINAL INTERBODY FUSION AND DECOMPRESSION WITH INSTRUMENTATION AND ALLOGRAFT;  Surgeon: Estill Bamberg, MD;  Location: [facility] OR;  Service: Orthopedics;  Laterality: Left;   TRANSTHORACIC ECHOCARDIOGRAM  12/08/2021   Normal LV size and function.  EF 55-60 %.  No RWMA.  Mild asymmetric basal septal LVH.  Normal diastolic function.  Mild RV dilation with normal function.  Normal MV, mild AOV calcification-sclerosis but no stenosis.    Current Facility-Administered Medications  Medication Dose Route Frequency Provider Last Rate Last Admin   [START ON 11/30/2022] tranexamic acid (CYKLOKAPRON) 2,000 mg in sodium chloride 0.9 % 50 mL Topical Application  2,000 mg Topical On Call to OR Wofford, Deirdre Evener, [facility]       Current Outpatient Medications  Medication Sig Dispense Refill Last Dose   allopurinol (ZYLOPRIM) 300 MG tablet Take 1 tablet (300 mg total) by mouth daily. 90 tablet 4    gabapentin (NEURONTIN) 300 MG capsule Take 1 capsule (300 mg total) by mouth at bedtime as needed (Nerve pan). 90 capsule 4    lisinopril (ZESTRIL) 5 MG tablet Take 1 tablet (5 mg total) by mouth daily. 90 tablet 4    Multiple Vitamins-Minerals (CENTRUM SILVER 50+MEN PO) Take 1 tablet by mouth daily.      NON FORMULARY Pt uses a cpap nightly      Omega 3-6-9 Fatty Acids (OMEGA 3-6-9 PO) Take 1,000 mg by mouth in the morning and at bedtime.      oxymetazoline (AFRIN) 0.05 % nasal spray Place 1 spray into both nostrils daily as needed for congestion.      Respiratory Therapy Supplies (CARETOUCH CPAP & BIPAP HOSE) MISC by Does not apply  route.      Allergies  Allergen Reactions   Ibuprofen     Weight gain   Meloxicam Other (See Comments)    Peripheral edema, weight gain   Omeprazole Diarrhea and Nausea And Vomiting    Vomiting/diarrhea on BID dosing    Social History   Tobacco Use   Smoking status: Never   Smokeless tobacco: Never  Substance Use Topics   Alcohol use: Yes    Comment: Rare    Family History  Problem Relation Age of Onset   Asthma Mother    Cancer Mother        Vaginal; radiation dz of bowel   Stroke Father 25   Diabetes Father    Obesity Sister    Esophageal cancer Neg Hx    Stomach cancer Neg Hx    Rectal cancer Neg Hx    Colon cancer Neg Hx    Heart attack Neg Hx    CAD  Neg Hx       Review of Systems  Musculoskeletal:  Positive for arthralgias.       Left knee  All other systems reviewed and are negative.    Objective:  Physical Exam Constitutional:      Appearance: Normal appearance.  HENT:     Head: Normocephalic and atraumatic.     Mouth/Throat:     Pharynx: Oropharynx is clear.  Eyes:     Extraocular Movements: Extraocular movements intact.  Pulmonary:     Effort: Pulmonary effort is normal.  Abdominal:     Palpations: Abdomen is soft.  Musculoskeletal:     Comments: Left knee has trace effusion and a benign scar.  His knee does not feel warm nor is it red.  Range of motion is about 5-75.  Calf is soft and nontender.    Skin:    General: Skin is warm and dry.  Neurological:     General: No focal deficit present.     Mental Status: He is alert and oriented to person, place, and time. Mental status is at baseline.  Psychiatric:        Mood and Affect: Mood normal.        Behavior: Behavior normal.        Thought Content: Thought content normal.        Judgment: Judgment normal.     Vital signs in last 24 hours:    Labs:  Estimated body mass index is 30.48 kg/m as calculated from the following:   Height as of 11/18/22: 5' 11.75" (1.822 m).   Weight as  of 11/18/22: 101.2 kg.  Imaging Review Plain radiographs demonstrate  no  degenerative joint disease of the left knee(s). The overall alignment is neutral.There is evidence of loosening of the tibial components. The bone quality appears to be good for age and reported activity level.  Assessment/Plan:  End stage primary arthritis, left knee(s) with failed previous arthroplasty.   The patient history, physical examination, clinical judgment of the provider and imaging studies are consistent with end stage degenerative joint disease of the left knee(s), previous total knee arthroplasty. Revision total knee arthroplasty is deemed medically necessary. The treatment options including medical management, injection therapy, arthroscopy and revision arthroplasty were discussed at length. The risks and benefits of revision total knee arthroplasty were presented and reviewed. The risks due to aseptic loosening, infection, stiffness, patella tracking problems, thromboembolic complications and other imponderables were discussed. The patient acknowledged the explanation, agreed to proceed with the plan and consent was signed. Patient is being admitted for inpatient treatment for surgery, pain control, PT, OT, prophylactic antibiotics, VTE prophylaxis, progressive ambulation and ADL's and discharge planning.The patient is planning to be discharged home with home health services

## 2022-11-30 ENCOUNTER — Inpatient Hospital Stay (HOSPITAL_COMMUNITY): Payer: Medicare Other | Admitting: Anesthesiology

## 2022-11-30 ENCOUNTER — Inpatient Hospital Stay (HOSPITAL_COMMUNITY)
Admission: RE | Admit: 2022-11-30 | Discharge: 2022-12-01 | DRG: 468 | Disposition: A | Discharge status: Home Health | Payer: Medicare Other | Attending: Orthopaedic Surgery | Admitting: Orthopaedic Surgery

## 2022-11-30 ENCOUNTER — Encounter (HOSPITAL_COMMUNITY)
Admission: RE | Disposition: A | Discharge status: Home Health | Payer: Self-pay | Source: Home / Self Care | Attending: Orthopaedic Surgery

## 2022-11-30 ENCOUNTER — Inpatient Hospital Stay (HOSPITAL_COMMUNITY): Payer: Medicare Other | Admitting: Physician Assistant

## 2022-11-30 ENCOUNTER — Other Ambulatory Visit: Payer: Self-pay

## 2022-11-30 ENCOUNTER — Encounter (HOSPITAL_COMMUNITY): Payer: Self-pay | Admitting: Orthopaedic Surgery

## 2022-11-30 DIAGNOSIS — M48062 Spinal stenosis, lumbar region with neurogenic claudication: Secondary | ICD-10-CM | POA: Diagnosis present

## 2022-11-30 DIAGNOSIS — I10 Essential (primary) hypertension: Secondary | ICD-10-CM | POA: Diagnosis present

## 2022-11-30 DIAGNOSIS — Y831 Surgical operation with implant of artificial internal device as the cause of abnormal reaction of the patient, or of later complication, without mention of misadventure at the time of the procedure: Secondary | ICD-10-CM | POA: Diagnosis present

## 2022-11-30 DIAGNOSIS — E669 Obesity, unspecified: Secondary | ICD-10-CM | POA: Diagnosis present

## 2022-11-30 DIAGNOSIS — R739 Hyperglycemia, unspecified: Secondary | ICD-10-CM | POA: Diagnosis present

## 2022-11-30 DIAGNOSIS — Z888 Allergy status to other drugs, medicaments and biological substances status: Secondary | ICD-10-CM

## 2022-11-30 DIAGNOSIS — Z79899 Other long term (current) drug therapy: Secondary | ICD-10-CM | POA: Diagnosis not present

## 2022-11-30 DIAGNOSIS — G4733 Obstructive sleep apnea (adult) (pediatric): Secondary | ICD-10-CM

## 2022-11-30 DIAGNOSIS — M170 Bilateral primary osteoarthritis of knee: Secondary | ICD-10-CM | POA: Diagnosis present

## 2022-11-30 DIAGNOSIS — Z6830 Body mass index (BMI) 30.0-30.9, adult: Secondary | ICD-10-CM

## 2022-11-30 DIAGNOSIS — T84033A Mechanical loosening of internal left knee prosthetic joint, initial encounter: Secondary | ICD-10-CM

## 2022-11-30 DIAGNOSIS — T8484XA Pain due to internal orthopedic prosthetic devices, implants and grafts, initial encounter: Secondary | ICD-10-CM | POA: Diagnosis present

## 2022-11-30 DIAGNOSIS — E785 Hyperlipidemia, unspecified: Secondary | ICD-10-CM | POA: Diagnosis present

## 2022-11-30 DIAGNOSIS — Z825 Family history of asthma and other chronic lower respiratory diseases: Secondary | ICD-10-CM | POA: Diagnosis not present

## 2022-11-30 DIAGNOSIS — G8918 Other acute postprocedural pain: Secondary | ICD-10-CM | POA: Diagnosis not present

## 2022-11-30 DIAGNOSIS — Z823 Family history of stroke: Secondary | ICD-10-CM | POA: Diagnosis not present

## 2022-11-30 DIAGNOSIS — Z833 Family history of diabetes mellitus: Secondary | ICD-10-CM

## 2022-11-30 DIAGNOSIS — R7303 Prediabetes: Secondary | ICD-10-CM | POA: Diagnosis present

## 2022-11-30 DIAGNOSIS — Z9989 Dependence on other enabling machines and devices: Secondary | ICD-10-CM

## 2022-11-30 DIAGNOSIS — Z886 Allergy status to analgesic agent status: Secondary | ICD-10-CM | POA: Diagnosis not present

## 2022-11-30 DIAGNOSIS — M109 Gout, unspecified: Secondary | ICD-10-CM | POA: Diagnosis present

## 2022-11-30 DIAGNOSIS — Z96652 Presence of left artificial knee joint: Secondary | ICD-10-CM | POA: Diagnosis not present

## 2022-11-30 DIAGNOSIS — I77810 Thoracic aortic ectasia: Secondary | ICD-10-CM | POA: Diagnosis present

## 2022-11-30 DIAGNOSIS — Z96641 Presence of right artificial hip joint: Secondary | ICD-10-CM | POA: Diagnosis present

## 2022-11-30 HISTORY — PX: TOTAL KNEE REVISION: SHX996

## 2022-11-30 SURGERY — TOTAL KNEE REVISION
Anesthesia: Spinal | Site: Knee | Laterality: Left

## 2022-11-30 MED ORDER — MORPHINE SULFATE (PF) 2 MG/ML IV SOLN: 0.5000 mg | INTRAVENOUS | Status: DC | PRN

## 2022-11-30 MED ORDER — PHENOL 1.4 % MT LIQD: 1.0000 | OROMUCOSAL | Status: DC | PRN

## 2022-11-30 MED ORDER — FENTANYL CITRATE (PF) 100 MCG/2ML IJ SOLN
INTRAMUSCULAR | Status: AC
  Filled 2022-11-30: qty 2

## 2022-11-30 MED ORDER — SODIUM CHLORIDE (PF) 0.9 % IJ SOLN
INTRAMUSCULAR | Status: DC | PRN
  Administered 2022-11-30: 30 mL

## 2022-11-30 MED ORDER — ONDANSETRON HCL 4 MG PO TABS: 4.0000 mg | ORAL_TABLET | Freq: Four times a day (QID) | ORAL | Status: DC | PRN

## 2022-11-30 MED ORDER — LACTATED RINGERS IV SOLN: INTRAVENOUS | Status: DC

## 2022-11-30 MED ORDER — HYDROMORPHONE HCL 1 MG/ML IJ SOLN: 0.2500 mg | INTRAMUSCULAR | Status: DC | PRN

## 2022-11-30 MED ORDER — ACETAMINOPHEN 500 MG PO TABS
500.0000 mg | ORAL_TABLET | Freq: Four times a day (QID) | ORAL | Status: AC
  Administered 2022-11-30 – 2022-12-01 (×4): 500 mg via ORAL
  Filled 2022-11-30 (×4): qty 1

## 2022-11-30 MED ORDER — BUPIVACAINE LIPOSOME 1.3 % IJ SUSP: 20.0000 mL | Freq: Once | INTRAMUSCULAR | Status: DC

## 2022-11-30 MED ORDER — GABAPENTIN 300 MG PO CAPS
300.0000 mg | ORAL_CAPSULE | Freq: Once | ORAL | Status: DC
  Filled 2022-11-30: qty 1

## 2022-11-30 MED ORDER — MIDAZOLAM HCL 5 MG/5ML IJ SOLN
INTRAMUSCULAR | Status: DC | PRN
  Administered 2022-11-30 (×2): 1 mg via INTRAVENOUS

## 2022-11-30 MED ORDER — METHOCARBAMOL 1000 MG/10ML IJ SOLN: 500.0000 mg | Freq: Four times a day (QID) | INTRAVENOUS | Status: DC | PRN

## 2022-11-30 MED ORDER — HYDROCODONE-ACETAMINOPHEN 7.5-325 MG PO TABS: 1.0000 | ORAL_TABLET | ORAL | Status: DC | PRN

## 2022-11-30 MED ORDER — MENTHOL 3 MG MT LOZG
1.0000 | LOZENGE | OROMUCOSAL | Status: DC | PRN
  Administered 2022-11-30: 3 mg via ORAL
  Filled 2022-11-30: qty 9

## 2022-11-30 MED ORDER — BUPIVACAINE-EPINEPHRINE (PF) 0.5% -1:200000 IJ SOLN
INTRAMUSCULAR | Status: DC | PRN
  Administered 2022-11-30: 30 mL

## 2022-11-30 MED ORDER — MEPERIDINE HCL 50 MG/ML IJ SOLN: 6.2500 mg | INTRAMUSCULAR | Status: DC | PRN

## 2022-11-30 MED ORDER — GABAPENTIN 300 MG PO CAPS: 300.0000 mg | ORAL_CAPSULE | Freq: Every evening | ORAL | Status: DC | PRN

## 2022-11-30 MED ORDER — ONDANSETRON HCL 4 MG/2ML IJ SOLN
INTRAMUSCULAR | Status: DC | PRN
  Administered 2022-11-30: 4 mg via INTRAVENOUS

## 2022-11-30 MED ORDER — SODIUM CHLORIDE (PF) 0.9 % IJ SOLN
INTRAMUSCULAR | Status: AC
  Filled 2022-11-30: qty 50

## 2022-11-30 MED ORDER — DEXAMETHASONE SODIUM PHOSPHATE 10 MG/ML IJ SOLN
INTRAMUSCULAR | Status: DC | PRN
  Administered 2022-11-30: 8 mg via INTRAVENOUS

## 2022-11-30 MED ORDER — BUPIVACAINE IN DEXTROSE 0.75-8.25 % IT SOLN
INTRATHECAL | Status: DC | PRN
  Administered 2022-11-30: 2 mL via INTRATHECAL

## 2022-11-30 MED ORDER — ASPIRIN 81 MG PO CHEW
81.0000 mg | CHEWABLE_TABLET | Freq: Two times a day (BID) | ORAL | Status: DC
  Administered 2022-12-01: 81 mg via ORAL
  Filled 2022-11-30: qty 1

## 2022-11-30 MED ORDER — ONDANSETRON HCL 4 MG/2ML IJ SOLN: 4.0000 mg | Freq: Four times a day (QID) | INTRAMUSCULAR | Status: DC | PRN

## 2022-11-30 MED ORDER — ORAL CARE MOUTH RINSE: 15.0000 mL | Freq: Once | OROMUCOSAL | Status: AC

## 2022-11-30 MED ORDER — ONDANSETRON HCL 4 MG/2ML IJ SOLN
INTRAMUSCULAR | Status: AC
  Filled 2022-11-30: qty 4

## 2022-11-30 MED ORDER — METOCLOPRAMIDE HCL 5 MG/ML IJ SOLN: 5.0000 mg | Freq: Three times a day (TID) | INTRAMUSCULAR | Status: DC | PRN

## 2022-11-30 MED ORDER — ROCURONIUM BROMIDE 10 MG/ML (PF) SYRINGE
PREFILLED_SYRINGE | INTRAVENOUS | Status: AC
  Filled 2022-11-30: qty 20

## 2022-11-30 MED ORDER — FENTANYL CITRATE (PF) 100 MCG/2ML IJ SOLN
INTRAMUSCULAR | Status: DC | PRN
  Administered 2022-11-30: 50 ug via INTRAVENOUS

## 2022-11-30 MED ORDER — TRANEXAMIC ACID-NACL 1000-0.7 MG/100ML-% IV SOLN
1000.0000 mg | INTRAVENOUS | Status: AC
  Administered 2022-11-30: 1000 mg via INTRAVENOUS
  Filled 2022-11-30: qty 100

## 2022-11-30 MED ORDER — PROPOFOL 1000 MG/100ML IV EMUL
INTRAVENOUS | Status: AC
  Filled 2022-11-30: qty 300

## 2022-11-30 MED ORDER — DEXAMETHASONE SODIUM PHOSPHATE 10 MG/ML IJ SOLN
INTRAMUSCULAR | Status: AC
  Filled 2022-11-30: qty 2

## 2022-11-30 MED ORDER — DIPHENHYDRAMINE HCL 12.5 MG/5ML PO ELIX: 12.5000 mg | ORAL_SOLUTION | ORAL | Status: DC | PRN

## 2022-11-30 MED ORDER — PHENYLEPHRINE 80 MCG/ML (10ML) SYRINGE FOR IV PUSH (FOR BLOOD PRESSURE SUPPORT)
PREFILLED_SYRINGE | INTRAVENOUS | Status: DC | PRN
  Administered 2022-11-30 (×5): 40 ug via INTRAVENOUS
  Administered 2022-11-30: 80 ug via INTRAVENOUS
  Administered 2022-11-30: 40 ug via INTRAVENOUS
  Administered 2022-11-30 (×2): 80 ug via INTRAVENOUS
  Administered 2022-11-30: 40 ug via INTRAVENOUS
  Administered 2022-11-30: 80 ug via INTRAVENOUS

## 2022-11-30 MED ORDER — ACETAMINOPHEN 325 MG PO TABS: 325.0000 mg | ORAL_TABLET | Freq: Four times a day (QID) | ORAL | Status: DC | PRN

## 2022-11-30 MED ORDER — BUPIVACAINE-EPINEPHRINE (PF) 0.5% -1:200000 IJ SOLN
INTRAMUSCULAR | Status: AC
  Filled 2022-11-30: qty 30

## 2022-11-30 MED ORDER — ALLOPURINOL 300 MG PO TABS
300.0000 mg | ORAL_TABLET | Freq: Every day | ORAL | Status: DC
  Administered 2022-12-01: 300 mg via ORAL
  Filled 2022-11-30: qty 1

## 2022-11-30 MED ORDER — PROPOFOL 500 MG/50ML IV EMUL
INTRAVENOUS | Status: DC | PRN
  Administered 2022-11-30: 75 ug/kg/min via INTRAVENOUS

## 2022-11-30 MED ORDER — HYDROCODONE-ACETAMINOPHEN 5-325 MG PO TABS
1.0000 | ORAL_TABLET | ORAL | Status: DC | PRN
  Administered 2022-12-01: 1 via ORAL
  Filled 2022-11-30: qty 1

## 2022-11-30 MED ORDER — 0.9 % SODIUM CHLORIDE (POUR BTL) OPTIME
TOPICAL | Status: DC | PRN
  Administered 2022-11-30: 1000 mL

## 2022-11-30 MED ORDER — ALUM & MAG HYDROXIDE-SIMETH 200-200-20 MG/5ML PO SUSP: 30.0000 mL | ORAL | Status: DC | PRN

## 2022-11-30 MED ORDER — EPHEDRINE SULFATE-NACL 50-0.9 MG/10ML-% IV SOSY
PREFILLED_SYRINGE | INTRAVENOUS | Status: DC | PRN
  Administered 2022-11-30 (×3): 5 mg via INTRAVENOUS
  Administered 2022-11-30: 10 mg via INTRAVENOUS
  Administered 2022-11-30 (×2): 5 mg via INTRAVENOUS

## 2022-11-30 MED ORDER — MIDAZOLAM HCL 2 MG/2ML IJ SOLN
INTRAMUSCULAR | Status: AC
  Filled 2022-11-30: qty 2

## 2022-11-30 MED ORDER — CEFAZOLIN SODIUM-DEXTROSE 2-4 GM/100ML-% IV SOLN
2.0000 g | INTRAVENOUS | Status: AC
  Administered 2022-11-30: 2 g via INTRAVENOUS
  Filled 2022-11-30: qty 100

## 2022-11-30 MED ORDER — TRANEXAMIC ACID-NACL 1000-0.7 MG/100ML-% IV SOLN
1000.0000 mg | Freq: Once | INTRAVENOUS | Status: AC
  Administered 2022-11-30: 1000 mg via INTRAVENOUS
  Filled 2022-11-30: qty 100

## 2022-11-30 MED ORDER — SODIUM CHLORIDE 0.9 % IR SOLN
Status: DC | PRN
  Administered 2022-11-30: 3000 mL

## 2022-11-30 MED ORDER — CEFAZOLIN SODIUM-DEXTROSE 2-4 GM/100ML-% IV SOLN
2.0000 g | Freq: Four times a day (QID) | INTRAVENOUS | Status: AC
  Administered 2022-11-30 (×2): 2 g via INTRAVENOUS
  Filled 2022-11-30 (×2): qty 100

## 2022-11-30 MED ORDER — EPHEDRINE 5 MG/ML INJ
INTRAVENOUS | Status: AC
  Filled 2022-11-30: qty 10

## 2022-11-30 MED ORDER — TRANEXAMIC ACID 1000 MG/10ML IV SOLN
INTRAVENOUS | Status: DC | PRN
  Administered 2022-11-30: 2000 mg via TOPICAL

## 2022-11-30 MED ORDER — LISINOPRIL 5 MG PO TABS
5.0000 mg | ORAL_TABLET | Freq: Every day | ORAL | Status: DC
  Administered 2022-12-01: 5 mg via ORAL
  Filled 2022-11-30 (×2): qty 1

## 2022-11-30 MED ORDER — ACETAMINOPHEN 500 MG PO TABS
1000.0000 mg | ORAL_TABLET | Freq: Once | ORAL | Status: DC
  Filled 2022-11-30: qty 2

## 2022-11-30 MED ORDER — PROPOFOL 10 MG/ML IV BOLUS
INTRAVENOUS | Status: AC
  Filled 2022-11-30: qty 20

## 2022-11-30 MED ORDER — POVIDONE-IODINE 10 % EX SWAB
2.0000 | Freq: Once | CUTANEOUS | Status: AC
  Administered 2022-11-30: 2 via TOPICAL

## 2022-11-30 MED ORDER — PHENYLEPHRINE 80 MCG/ML (10ML) SYRINGE FOR IV PUSH (FOR BLOOD PRESSURE SUPPORT)
PREFILLED_SYRINGE | INTRAVENOUS | Status: AC
  Filled 2022-11-30: qty 10

## 2022-11-30 MED ORDER — DOCUSATE SODIUM 100 MG PO CAPS
100.0000 mg | ORAL_CAPSULE | Freq: Two times a day (BID) | ORAL | Status: DC
  Administered 2022-11-30 – 2022-12-01 (×2): 100 mg via ORAL
  Filled 2022-11-30 (×2): qty 1

## 2022-11-30 MED ORDER — KETOROLAC TROMETHAMINE 15 MG/ML IJ SOLN
7.5000 mg | Freq: Four times a day (QID) | INTRAMUSCULAR | Status: AC
  Administered 2022-11-30 – 2022-12-01 (×4): 7.5 mg via INTRAVENOUS
  Filled 2022-11-30 (×5): qty 1

## 2022-11-30 MED ORDER — AMISULPRIDE (ANTIEMETIC) 5 MG/2ML IV SOLN: 10.0000 mg | Freq: Once | INTRAVENOUS | Status: DC | PRN

## 2022-11-30 MED ORDER — BISACODYL 5 MG PO TBEC: 5.0000 mg | DELAYED_RELEASE_TABLET | Freq: Every day | ORAL | Status: DC | PRN

## 2022-11-30 MED ORDER — METOCLOPRAMIDE HCL 5 MG PO TABS: 5.0000 mg | ORAL_TABLET | Freq: Three times a day (TID) | ORAL | Status: DC | PRN

## 2022-11-30 MED ORDER — CHLORHEXIDINE GLUCONATE 0.12 % MT SOLN
15.0000 mL | Freq: Once | OROMUCOSAL | Status: AC
  Administered 2022-11-30: 15 mL via OROMUCOSAL

## 2022-11-30 MED ORDER — BUPIVACAINE LIPOSOME 1.3 % IJ SUSP
INTRAMUSCULAR | Status: DC | PRN
  Administered 2022-11-30: 20 mL

## 2022-11-30 MED ORDER — BUPIVACAINE LIPOSOME 1.3 % IJ SUSP
INTRAMUSCULAR | Status: AC
  Filled 2022-11-30: qty 20

## 2022-11-30 MED ORDER — METHOCARBAMOL 500 MG PO TABS
500.0000 mg | ORAL_TABLET | Freq: Four times a day (QID) | ORAL | Status: DC | PRN
  Administered 2022-11-30: 500 mg via ORAL
  Filled 2022-11-30 (×2): qty 1

## 2022-11-30 SURGICAL SUPPLY — 58 items
BAG COUNTER SPONGE SURGICOUNT (BAG) IMPLANT
BAG SPEC THK2 15X12 ZIP CLS (MISCELLANEOUS) ×1
BAG SPNG CNTER NS LX DISP (BAG)
BAG ZIPLOCK 12X15 (MISCELLANEOUS) ×2 IMPLANT
BLADE SAGITTAL 25.0X1.19X90 (BLADE) IMPLANT
BLADE SAW SGTL 11.0X1.19X90.0M (BLADE) IMPLANT
BLADE SURG SZ10 CARB STEEL (BLADE) ×4 IMPLANT
BNDG CMPR 5X62 HK CLSR LF (GAUZE/BANDAGES/DRESSINGS) ×1
BNDG CMPR MED 15X6 ELC VLCR LF (GAUZE/BANDAGES/DRESSINGS) ×1
BNDG ELASTIC 6INX 5YD STR LF (GAUZE/BANDAGES/DRESSINGS) ×2 IMPLANT
BNDG ELASTIC 6X15 VLCR STRL LF (GAUZE/BANDAGES/DRESSINGS) IMPLANT
BOOTIES KNEE HIGH SLOAN (MISCELLANEOUS) ×2 IMPLANT
CEMENT HV SMART SET (Cement) IMPLANT
CUFF TOURN SGL QUICK 34 (TOURNIQUET CUFF) ×1
CUFF TRNQT CYL 34X4.125X (TOURNIQUET CUFF) ×2 IMPLANT
DRAPE INCISE IOBAN 66X45 STRL (DRAPES) ×6 IMPLANT
DRAPE ORTHO SPLIT 77X108 STRL (DRAPES)
DRAPE SURG ORHT 6 SPLT 77X108 (DRAPES) IMPLANT
DRAPE U-SHAPE 47X51 STRL (DRAPES) ×2 IMPLANT
DRSG AQUACEL AG ADV 3.5X10 (GAUZE/BANDAGES/DRESSINGS) ×2 IMPLANT
DURAPREP 26ML APPLICATOR (WOUND CARE) ×4 IMPLANT
ELECT REM PT RETURN 15FT ADLT (MISCELLANEOUS) ×2 IMPLANT
EVACUATOR 1/8 PVC DRAIN (DRAIN) IMPLANT
GLOVE BIO SURGEON STRL SZ8 (GLOVE) ×4 IMPLANT
GLOVE BIOGEL PI IND STRL 7.0 (GLOVE) ×2 IMPLANT
GLOVE BIOGEL PI IND STRL 8 (GLOVE) ×4 IMPLANT
GLOVE SURG SYN 7.0 (GLOVE) ×1 IMPLANT
GLOVE SURG SYN 7.0 PF PI (GLOVE) ×2 IMPLANT
GOWN SRG XL LVL 4 BRTHBL STRL (GOWNS) ×2 IMPLANT
GOWN STRL NON-REIN XL LVL4 (GOWNS) ×1
GOWN STRL REUS W/ TWL XL LVL3 (GOWN DISPOSABLE) ×4 IMPLANT
GOWN STRL REUS W/TWL XL LVL3 (GOWN DISPOSABLE) ×2
HANDPIECE INTERPULSE COAX TIP (DISPOSABLE) ×1
HOLDER FOLEY CATH W/STRAP (MISCELLANEOUS) IMPLANT
INSERT TIB LCS RP LRG 12.5 (Knees) IMPLANT
KIT TURNOVER KIT A (KITS) IMPLANT
NDL SAFETY ECLIP 18X1.5 (MISCELLANEOUS) IMPLANT
NS IRRIG 1000ML POUR BTL (IV SOLUTION) ×2 IMPLANT
PACK TOTAL KNEE CUSTOM (KITS) IMPLANT
PAD ARMBOARD 7.5X6 YLW CONV (MISCELLANEOUS) ×2 IMPLANT
PIN STEINMAN FIXATION KNEE (PIN) IMPLANT
PROTECTOR NERVE ULNAR (MISCELLANEOUS) ×2 IMPLANT
SET HNDPC FAN SPRY TIP SCT (DISPOSABLE) ×2 IMPLANT
SPIKE FLUID TRANSFER (MISCELLANEOUS) IMPLANT
STAPLER VISISTAT 35W (STAPLE) IMPLANT
STEM TIBIA PFC 13X30MM (Stem) IMPLANT
STRIP CLOSURE SKIN 1/4X4 (GAUZE/BANDAGES/DRESSINGS) IMPLANT
SUT ETHIBOND NAB CT1 #1 30IN (SUTURE) ×4 IMPLANT
SUT VIC AB 0 CT1 36 (SUTURE) ×2 IMPLANT
SUT VIC AB 2-0 CT1 27 (SUTURE) ×1
SUT VIC AB 2-0 CT1 TAPERPNT 27 (SUTURE) ×2 IMPLANT
SUT VICRYL AB 3-0 FS1 BRD 27IN (SUTURE) ×2 IMPLANT
SUT VLOC 180 0 24IN GS25 (SUTURE) ×2 IMPLANT
SYR 3ML LL SCALE MARK (SYRINGE) IMPLANT
TRAY FOLEY MTR SLVR 16FR STAT (SET/KITS/TRAYS/PACK) ×2 IMPLANT
TRAY REVISION SZ 6 (Knees) IMPLANT
WATER STERILE IRR 1000ML POUR (IV SOLUTION) ×2 IMPLANT
WRAP KNEE MAXI GEL POST OP (GAUZE/BANDAGES/DRESSINGS) IMPLANT

## 2022-11-30 NOTE — Anesthesia Procedure Notes (Addendum)
Spinal  Patient location during procedure: OR Start time: 11/30/2022 7:40 AM End time: 11/30/2022 7:48 AM Reason for block: surgical anesthesia Staffing Performed: anesthesiologist  Anesthesiologist: [name], MD Performed by: [name], MD Authorized by: [name], MD   Preanesthetic Checklist Completed: patient identified, IV checked, site marked, risks and benefits discussed, surgical consent, monitors and equipment checked, pre-op evaluation and timeout performed Spinal Block Patient position: sitting Prep: DuraPrep and site prepped and draped Patient monitoring: heart rate, continuous pulse ox and blood pressure Approach: right paramedian Location: L2-3 Injection technique: single-shot Needle Needle type: Spinocan  Needle gauge: 25 G Needle length: 9 cm Additional Notes Expiration date of kit checked and confirmed. Patient tolerated procedure well, without complications.

## 2022-11-30 NOTE — Evaluation (Signed)
Physical Therapy Evaluation Patient Details Name: Ronald Bullock. MRN: [id number] DOB: 1951-05-08 Today's Date: 11/30/2022  History of Present Illness  72 yo male s/p L TK rev 11/30/22. Hx of L TKA 2014, R THA 2014, obesity, vertigo, lumbar fusion 2023  Clinical Impression  On eval POD 0, pt was Min guard A for mobility. He walked ~75 feet with a RW. Moderate pain with activity. Potential plan for d/c home on tomorrow if pain is controlled and pt meets PT goals.        Recommendations for follow up therapy are one component of a multi-disciplinary discharge planning process, led by the attending physician.  Recommendations may be updated based on patient status, additional functional criteria and insurance authorization.  Follow Up Recommendations       Assistance Recommended at Discharge Intermittent Supervision/Assistance  Patient can return home with the following  Assist for transportation;Assistance with cooking/housework;Help with stairs or ramp for entrance    Equipment Recommendations None recommended by PT  Recommendations for Other Services       Functional Status Assessment Patient has had a recent decline in their functional status and demonstrates the ability to make significant improvements in function in a reasonable and predictable amount of time.     Precautions / Restrictions Precautions Precautions: Fall Restrictions Weight Bearing Restrictions: No LLE Weight Bearing: Weight bearing as tolerated      Mobility  Bed Mobility Overal bed mobility: Needs Assistance Bed Mobility: Supine to Sit     Supine to sit: Supervision, HOB elevated     General bed mobility comments: Supv for safety, lines    Transfers Overall transfer level: Needs assistance Equipment used: Rolling walker (2 wheels) Transfers: Sit to/from Stand Sit to Stand: Min guard, From elevated surface           General transfer comment: Min guard A for safety. Cues for safety,  hand/LE placement.    Ambulation/Gait Ambulation/Gait assistance: Min guard Gait Distance (Feet): 75 Feet Assistive device: Rolling walker (2 wheels) Gait Pattern/deviations: Step-through pattern, Decreased stride length       General Gait Details: Min guard A. Steady gait with RW. No dizziness reported. Pt tolerated distance well.  Stairs            Wheelchair Mobility    Modified Rankin (Stroke Patients Only)       Balance Overall balance assessment: Needs assistance         Standing balance support: During functional activity, Reliant on assistive device for balance Standing balance-Leahy Scale: Fair                               Pertinent Vitals/Pain Pain Assessment Pain Assessment: 0-10 Pain Score: 5  Pain Location: L knee Pain Descriptors / Indicators: Aching Pain Intervention(s): Limited activity within patient's tolerance, Monitored during session, Repositioned, Ice applied    Home Living Family/patient expects to be discharged to:: Private residence Living Arrangements: Spouse/significant other Available Help at Discharge: Family;Available 24 hours/day Type of Home: House Home Access: Stairs to enter Entrance Stairs-Rails: Right Entrance Stairs-Number of Steps: 2   Home Layout: One level Home Equipment: Agricultural consultant (2 wheels);Cane - single point      Prior Function Prior Level of Function : Independent/Modified Independent                     Hand Dominance   Dominant Hand: Right    Extremity/Trunk  Assessment   Upper Extremity Assessment Upper Extremity Assessment: Overall WFL for tasks assessed    Lower Extremity Assessment Lower Extremity Assessment: Generalized weakness    Cervical / Trunk Assessment Cervical / Trunk Assessment: Normal  Communication   Communication: No difficulties  Cognition Arousal/Alertness: Awake/alert Behavior During Therapy: WFL for tasks assessed/performed Overall Cognitive  Status: Within Functional Limits for tasks assessed                                          General Comments      Exercises     Assessment/Plan    PT Assessment Patient needs continued PT services  PT Problem List Decreased strength;Decreased balance;Decreased activity tolerance;Decreased mobility;Decreased range of motion;Decreased knowledge of use of DME       PT Treatment Interventions DME instruction;Gait training;Functional mobility training;Therapeutic activities;Balance training;Patient/family education;Stair training;Therapeutic exercise    PT Goals (Current goals can be found in the Care Plan section)  Acute Rehab PT Goals Patient Stated Goal: regain PLOF/independence PT Goal Formulation: With patient Time For Goal Achievement: 12/14/22 Potential to Achieve Goals: Good    Frequency 7X/week     Co-evaluation               AM-PAC PT "6 Clicks" Mobility  Outcome Measure Help needed turning from your back to your side while in a flat bed without using bedrails?: A Little Help needed moving from lying on your back to sitting on the side of a flat bed without using bedrails?: A Little Help needed moving to and from a bed to a chair (including a wheelchair)?: A Little Help needed standing up from a chair using your arms (e.g., wheelchair or bedside chair)?: A Little Help needed to walk in hospital room?: A Little Help needed climbing 3-5 steps with a railing? : A Little 6 Click Score: 18    End of Session Equipment Utilized During Treatment: Gait belt Activity Tolerance: Patient tolerated treatment well Patient left: in chair;with call bell/phone within reach;with family/visitor present   PT Visit Diagnosis: Pain;Other abnormalities of gait and mobility (R26.89) Pain - Right/Left: Left Pain - part of body: Knee    Time: 4540-9811 PT Time Calculation (min) (ACUTE ONLY): 20 min   Charges:   PT Evaluation $PT Eval Low Complexity: 1 Low             Faye Ramsay, PT Acute Rehabilitation  Office: [phone]

## 2022-11-30 NOTE — Plan of Care (Signed)
  Problem: Education: Goal: Knowledge of the prescribed therapeutic regimen will improve Outcome: Progressing   Problem: Activity: Goal: Range of joint motion will improve Outcome: Progressing   Problem: Pain Management: Goal: Pain level will decrease with appropriate interventions Outcome: Progressing   Problem: Safety: Goal: Ability to remain free from injury will improve Outcome: Progressing   

## 2022-11-30 NOTE — Transfer of Care (Signed)
Immediate Anesthesia Transfer of Care Note  Patient: Ronald Bullock.  Procedure(s) Performed: Procedure(s): LEFT TOTAL KNEE REVISION (Left)  Patient Location: PACU  Anesthesia Type:Spinal  Level of Consciousness:  sedated, patient cooperative and responds to stimulation  Airway & Oxygen Therapy:Patient Spontanous Breathing and Patient connected to face mask oxgen  Post-op Assessment:  Report given to PACU RN and Post -op Vital signs reviewed and stable  Post vital signs:  Reviewed and stable  Last Vitals:  Vitals:   11/30/22 0611  BP: 136/75  Pulse: 60  Resp: 10  Temp: 37.1 C  SpO2: 100%    Complications: No apparent anesthesia complications

## 2022-11-30 NOTE — Anesthesia Procedure Notes (Signed)
Anesthesia Regional Block: Adductor canal block   Pre-Anesthetic Checklist: , timeout performed,  Correct Patient, Correct Site, Correct Laterality,  Correct Procedure, Correct Position, site marked,  Risks and benefits discussed,  Surgical consent,  Pre-op evaluation,  At surgeon's request and post-op pain management  Laterality: Lower and Left  Prep: chloraprep       Needles:  Injection technique: Single-shot  Needle Type: Stimiplex     Needle Length: 9cm  Needle Gauge: 21     Additional Needles:   Procedures:,,,, ultrasound used (permanent image in chart),,    Narrative:  Start time: 11/30/2022 6:56 AM End time: 11/30/2022 7:16 AM Injection made incrementally with aspirations every 5 mL.  Performed by: Personally  Anesthesiologist: [name], MD  Additional Notes: BP cuff, EKG monitors applied. Sedation begun. Artery and nerve location verified with ultrasound. Anesthetic injected incrementally (5ml), slowly, and after negative aspirations under direct u/s guidance. Good fascial/perineural spread. Tolerated well.

## 2022-11-30 NOTE — Interval H&P Note (Signed)
History and Physical Interval Note:  11/30/2022 7:25 AM  Ronald Bullock.  has presented today for surgery, with the diagnosis of LOOSE LEFT KNEE REPLACEMENT.  The various methods of treatment have been discussed with the patient and family. After consideration of risks, benefits and other options for treatment, the patient has consented to  Procedure(s): LEFT TOTAL KNEE REVISION (Left) as a surgical intervention.  The patient's history has been reviewed, patient examined, no change in status, stable for surgery.  I have reviewed the patient's chart and labs.  Questions were answered to the patient's satisfaction.     Velna Ochs

## 2022-11-30 NOTE — Op Note (Addendum)
PREOP DIAGNOSIS: Mechanical loosening of LEFT TKR POSTOP DIAGNOSIS:  same PROCEDURE: LEFT TKR tibial component revision ANESTHESIA: Spinal and MAC ATTENDING SURGEON: Velna Ochs ASSISTANT: Elodia Florence PA  INDICATIONS FOR PROCEDURE: Ronald Bullock. is a 72 y.o. male who has struggled for a long time with pain years out from TKR.  The patient has failed many conservative non-operative measures and at this point has pain which limits the ability to sleep and walk.  Bone scan and plain films are concerning for tibial loosening. Aspirates have been negative for infection.The patient is offered total knee replacement revision.  Informed operative consent was obtained after discussion of possible risks of anesthesia, infection, neurovascular injury, DVT, and death.  The importance of the post-operative rehabilitation protocol to optimize result was stressed extensively with the patient.  SUMMARY OF FINDINGS AND PROCEDURE:  Ronald Makki. was taken to the operative suite where under the above anesthesia a left knee replacement revision was performed.  The tibial component was loose at the cement component interval. The bone quality was excellent and we lost very little bone.  We used the DePuy LCS system and placed size 6 MBT revision tibia and a size 12.5 mm spacer.  Elodia Florence PA-C assisted throughout and was invaluable to the completion of the case in that he helped retract and maintain exposure while I placed the components.  He also helped close thereby minimizing OR time.  The patient was admitted for appropriate post-op care to include perioperative antibiotics and mechanical and pharmacologic measures for DVT prophylaxis.  DESCRIPTION OF PROCEDURE:  Ronald Cardosa. was taken to the operative suite where the above anesthesia was applied.  The patient was positioned supine and prepped and draped in normal sterile fashion.  An appropriate time out was performed.  After the  administration of kefzol pre-op antibiotic the leg was elevated and exsanguinated and a tourniquet inflated.  His old standard longitudinal incision was followed on the anterior knee.  Dissection was carried down to the extensor mechanism.  All appropriate anti-infective measures were used including the pre-operative antibiotic, betadine impregnated drape, and closed hooded exhaust systems for each member of the surgical team.  A medial parapatellar incision was made in the extensor mechanism and the knee cap flipped and the knee flexed.  An abundance of scar tissue was excised  The tibial component was loose and came out easily losing no bone.A guide was placed on the tibia and a flat cut was made on it's superior surface removing the cement mantle. I removed the rest of the cement from the canal without difficulty.  An intramedullary guide was placed in the tibia and was utilized to make appropriate reaming and the tibial sized to a 6 which was his previous tibial size as well. A trial reduction was done and the knee easily came to full extension and the patella tracked well on flexion utilizing a 12.5 LCS insert.  The trial components were removed and all bones were cleaned with pulsatile lavage and then dried thoroughly.  Cement was mixed and was pressurized onto the bone followed by placement of the aforementioned components.  Excess cement was trimmed and pressure was held on the component until the cement had hardened.  The tourniquet was deflated and a small amount of bleeding was controlled with cautery and pressure.  The knee was irrigated thoroughly.  The extensor mechanism was re-approximated with #1 ethibond in interrupted fashion.  The knee was flexed and the repair  was solid.  The subcutaneous tissues were re-approximated with #0 and #2-0 vicryl and the skin closed with a subcuticular stitch and steristrips.  A sterile dressing was applied.  Intraoperative fluids, EBL, and tourniquet time can be  obtained from anesthesia records.  DISPOSITION:  The patient was taken to recovery room in stable condition and scheduled to potentially go home same day depending on ability to walk and tolerate liquids.  Velna Ochs 11/30/2022, 9:40 AM

## 2022-12-01 MED ORDER — HYDROCODONE-ACETAMINOPHEN 5-325 MG PO TABS: 1.0000 | ORAL_TABLET | Freq: Four times a day (QID) | ORAL | 0 refills | Status: DC | PRN

## 2022-12-01 MED ORDER — ASPIRIN 81 MG PO CHEW: 81.0000 mg | CHEWABLE_TABLET | Freq: Two times a day (BID) | ORAL | 0 refills | Status: DC

## 2022-12-01 MED ORDER — TIZANIDINE HCL 4 MG PO TABS: 4.0000 mg | ORAL_TABLET | Freq: Four times a day (QID) | ORAL | 1 refills | Status: DC | PRN

## 2022-12-01 NOTE — Discharge Summary (Signed)
Patient ID: Ronald Bullock. MRN: [id number] DOB/AGE: Jun 03, 1951 72 y.o.  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   Discharge Diagnoses:  Same  Past Medical History:  Diagnosis Date   Arthritis    L end stage knee DKD, mild R knee DJD, R hip end stage DJD   DDD (degenerative disc disease), cervical    multilevel   Gout    rare flares   HLD (hyperlipidemia)    Low HDL   Hyperglycemia    Hypertension    Lumbar spinal stenosis 11/15/2014   severe central canal stenosis L3/4;L4/5 bilat facet hypertrophy, annular disc bulging, grade 1 sphondylolisthesis (Dumonski) planned PT and ESI (Wang)   Lumbar spinal stenosis    s/p lumbar decompressive surgery 11/2021 (Dumonski)   OSA on CPAP    Osteoarthritis    R knee and hip, s/p L TKR   Pre-diabetes     Surgeries: Procedure(s): LEFT TOTAL KNEE REVISION on 11/30/2022   Consultants:   Discharged Condition: Improved  Hospital Course: Alvia Tory. is an 72 y.o. male who was admitted 11/30/2022 for operative treatment ofS/P revision of total knee, left. Patient has severe unremitting pain that affects sleep, daily activities, and work/hobbies. After pre-op clearance the patient was taken to the operating room on 11/30/2022 and underwent  Procedure(s): LEFT TOTAL KNEE REVISION.    Patient was given perioperative antibiotics:  Anti-infectives (From admission, onward)    Start     Dose/Rate Route Frequency Ordered Stop   11/30/22 1330  ceFAZolin (ANCEF) IVPB 2g/100 mL premix        2 g 200 mL/hr over 30 Minutes Intravenous Every 6 hours 11/30/22 1143 11/30/22 2016   11/30/22 0600  ceFAZolin (ANCEF) IVPB 2g/100 mL premix        2 g 200 mL/hr over 30 Minutes Intravenous On call to O.R. 11/30/22 4098 11/30/22 0748        Patient was given sequential  compression devices, early ambulation, and chemoprophylaxis to prevent DVT.  Patient benefited maximally from hospital stay and there were no complications.    Recent vital signs: Patient Vitals for the past 24 hrs:  BP Temp Temp src Pulse Resp SpO2 Height Weight  12/01/22 0506 (!) 140/65 (!) 97.4 F (36.3 C) Oral 61 16 100 % -- --  12/01/22 0130 117/68 97.9 F (36.6 C) Oral (!) 58 16 99 % -- --  11/30/22 2121 (!) 144/60 97.7 F (36.5 C) Oral 65 18 100 % -- --  11/30/22 2032 -- -- -- 75 17 99 % -- --  11/30/22 1807 123/62 98 F (36.7 C) Oral 69 16 98 % -- --  11/30/22 1311 124/61 (!) 97.4 F (36.3 C) Oral 63 14 97 % -- --  11/30/22 1224 -- -- -- -- -- -- 5\' 11"  (1.803 m) 101.2 kg  11/30/22 1137 121/60 (!) 97.5 F (36.4 C) Oral 60 16 98 % -- --  11/30/22 1125 -- -- -- 60 15 96 % -- --  11/30/22 1115 (!) 117/56 -- -- (!) 56 15 96 % -- --  11/30/22 1100 (!) 121/54 -- -- 61 13 95 % -- --  11/30/22 1045 (!) 117/56 -- -- 63 12 96 % -- --  11/30/22 1030 129/61 -- -- 73 14 97 % -- --  11/30/22 1015 (!) 123/56 -- --  71 16 100 % -- --  11/30/22 1011 119/67 (!) 96.5 F (35.8 C) Axillary 71 16 100 % -- --     Recent laboratory studies: No results for input(s): "WBC", "HGB", "HCT", "PLT", "NA", "K", "CL", "CO2", "BUN", "CREATININE", "GLUCOSE", "INR", "CALCIUM" in the last 72 hours.  Invalid input(s): "PT", "2"   Discharge Medications:   Allergies as of 12/01/2022       Reactions   Ibuprofen    Weight gain   Meloxicam Other (See Comments)   Peripheral edema, weight gain   Omeprazole Diarrhea, Nausea And Vomiting   Vomiting/diarrhea on BID dosing        Medication List     TAKE these medications    allopurinol 300 MG tablet Commonly known as: ZYLOPRIM Take 1 tablet (300 mg total) by mouth daily.   aspirin 81 MG chewable tablet Chew 1 tablet (81 mg total) by mouth 2 (two) times daily. For 2 weeks then once a day for 2 weeks for blood clot prevention.   CareTouch CPAP &  BIPAP Hose Misc by Does not apply route.   CENTRUM SILVER 50+MEN PO Take 1 tablet by mouth daily.   gabapentin 300 MG capsule Commonly known as: NEURONTIN Take 1 capsule (300 mg total) by mouth at bedtime as needed (Nerve pan).   HYDROcodone-acetaminophen 5-325 MG tablet Commonly known as: NORCO/VICODIN Take 1-2 tablets by mouth every 6 (six) hours as needed for severe pain or moderate pain (pain scpost op pain).   lisinopril 5 MG tablet Commonly known as: ZESTRIL Take 1 tablet (5 mg total) by mouth daily.   NON FORMULARY Pt uses a cpap nightly   OMEGA 3-6-9 PO Take 1,000 mg by mouth in the morning and at bedtime.   oxymetazoline 0.05 % nasal spray Commonly known as: AFRIN Place 1 spray into both nostrils daily as needed for congestion.   tiZANidine 4 MG tablet Commonly known as: Zanaflex Take 1 tablet (4 mg total) by mouth every 6 (six) hours as needed for muscle spasms.               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  Disposition: Discharge disposition: 01-Home or Self Care       Discharge Instructions     Call MD / Call 911   Complete by: As directed    If you experience chest pain or shortness of breath, CALL 911 and be transported to the hospital emergency room.  If you develope a fever above 101 F, pus (white drainage) or increased drainage or redness at the wound, or calf  pain, call your surgeon's office.   Constipation Prevention  Complete by: As directed    Drink plenty of fluids.  Prune juice may be helpful.  You may use a stool softener, such as Colace (over the counter) 100 mg twice a day.  Use MiraLax (over the counter) for constipation as needed.   Diet - low sodium heart healthy   Complete by: As directed    Discharge instructions   Complete by: As directed    INSTRUCTIONS AFTER JOINT REPLACEMENT   Remove items at home which could result in a fall. This includes throw rugs or furniture in walking pathways ICE to the affected joint every three hours while awake for 30 minutes at a time, for at least the first 3-5 days, and then as needed for pain and swelling.  Continue to use ice for pain and swelling. You may notice swelling that will progress down to the foot and ankle.  This is normal after surgery.  Elevate your leg when you are not up walking on it.   Continue to use the breathing machine you got in the hospital (incentive spirometer) which will help keep your temperature down.  It is common for your temperature to cycle up and down following surgery, especially at night when you are not up moving around and exerting yourself.  The breathing machine keeps your lungs expanded and your temperature down.   DIET:  As you were doing prior to hospitalization, we recommend a well-balanced diet.  DRESSING / WOUND CARE / SHOWERING  You may shower 3 days after surgery, but keep the wounds dry during showering.  You may use an occlusive plastic wrap (Press'n Seal for example), NO SOAKING/SUBMERGING IN THE BATHTUB.  If the bandage gets wet, change with a clean dry gauze.  If the incision gets wet, pat the wound dry with a clean towel.  ACTIVITY  Increase activity slowly as tolerated, but follow the weight bearing instructions below.   No driving for 6 weeks or until further direction given by your physician.  You cannot drive while taking narcotics.  No  lifting or carrying greater than 10 lbs. until further directed by your surgeon. Avoid periods of inactivity such as sitting longer than an hour when not asleep. This helps prevent blood clots.  You may return to work once you are authorized by your doctor.     WEIGHT BEARING   Weight bearing as tolerated with assist device (walker, cane, etc) as directed, use it as long as suggested by your surgeon or therapist, typically at least 4-6 weeks.   EXERCISES  Results after joint replacement surgery are often greatly improved when you follow the exercise, range of motion and muscle strengthening exercises prescribed by your doctor. Safety measures are also important to protect the joint from further injury. Any time any of these exercises cause you to have increased pain or swelling, decrease what you are doing until you are comfortable again and then slowly increase them. If you have problems or questions, call your caregiver or physical therapist for advice.   Rehabilitation is important following a joint replacement. After just a few days of immobilization, the muscles of the leg can become weakened and shrink (atrophy).  These exercises are designed to build up the tone and strength of the thigh and leg muscles and to improve motion. Often times heat used for twenty to thirty minutes before working out will loosen up your tissues and help with improving the range of motion but do not use heat for the first two weeks  following surgery (sometimes heat can increase post-operative swelling).   These exercises can be done on a training (exercise) mat, on the floor, on a table or on a bed. Use whatever works the best and is most comfortable for you.    Use music or television while you are exercising so that the exercises are a pleasant break in your day. This will make your life better with the exercises acting as a break in your routine that you can look forward to.   Perform all exercises about fifteen  times, three times per day or as directed.  You should exercise both the operative leg and the other leg as well.  Exercises include:   Quad Sets - Tighten up the muscle on the front of the thigh (Quad) and hold for 5-10 seconds.   Straight Leg Raises - With your knee straight (if you were given a brace, keep it on), lift the leg to 60 degrees, hold for 3 seconds, and slowly lower the leg.  Perform this exercise against resistance later as your leg gets stronger.  Leg Slides: Lying on your back, slowly slide your foot toward your buttocks, bending your knee up off the floor (only go as far as is comfortable). Then slowly slide your foot back down until your leg is flat on the floor again.  Angel Wings: Lying on your back spread your legs to the side as far apart as you can without causing discomfort.  Hamstring Strength:  Lying on your back, push your heel against the floor with your leg straight by tightening up the muscles of your buttocks.  Repeat, but this time bend your knee to a comfortable angle, and push your heel against the floor.  You may put a pillow under the heel to make it more comfortable if necessary.   A rehabilitation program following joint replacement surgery can speed recovery and prevent re-injury in the future due to weakened muscles. Contact your doctor or a physical therapist for more information on knee rehabilitation.    CONSTIPATION  Constipation is defined medically as fewer than three stools per week and severe constipation as less than one stool per week.  Even if you have a regular bowel pattern at home, your normal regimen is likely to be disrupted due to multiple reasons following surgery.  Combination of anesthesia, postoperative narcotics, change in appetite and fluid intake all can affect your bowels.   YOU MUST use at least one of the following options; they are listed in order of increasing strength to get the job done.  They are all available over the  counter, and you may need to use some, POSSIBLY even all of these options:    Drink plenty of fluids (prune juice may be helpful) and high fiber foods Colace 100 mg by mouth twice a day  Senokot for constipation as directed and as needed Dulcolax (bisacodyl), take with full glass of water  Miralax (polyethylene glycol) once or twice a day as needed.  If you have tried all these things and are unable to have a bowel movement in the first 3-4 days after surgery call either your surgeon or your primary doctor.    If you experience loose stools or diarrhea, hold the medications until you stool forms back up.  If your symptoms do not get better within 1 week or if they get worse, check with your doctor.  If you experience "the worst abdominal pain ever" or develop nausea or vomiting, please contact  the office immediately for further recommendations for treatment.   ITCHING:  If you experience itching with your medications, try taking only a single pain pill, or even half a pain pill at a time.  You can also use Benadryl over the counter for itching or also to help with sleep.   TED HOSE STOCKINGS:  Use stockings on both legs until for at least 2 weeks or as directed by physician office. They may be removed at night for sleeping.  MEDICATIONS:  See your medication summary on the "After Visit Summary" that nursing will review with you.  You may have some home medications which will be placed on hold until you complete the course of blood thinner medication.  It is important for you to complete the blood thinner medication as prescribed.  PRECAUTIONS:  If you experience chest pain or shortness of breath - call 911 immediately for transfer to the hospital emergency department.   If you develop a fever greater that 101 F, purulent drainage from wound, increased redness or drainage from wound, foul odor from the wound/dressing, or calf pain - CONTACT YOUR SURGEON.                                                    FOLLOW-UP APPOINTMENTS:  If you do not already have a post-op appointment, please call the office for an appointment to be seen by your surgeon.  Guidelines for how soon to be seen are listed in your "After Visit Summary", but are typically between 1-4 weeks after surgery.  OTHER INSTRUCTIONS:   Knee Replacement:  Do not place pillow under knee, focus on keeping the knee straight while resting. CPM instructions: 0-90 degrees, 2 hours in the morning, 2 hours in the afternoon, and 2 hours in the evening. Place foam block, curve side up under heel at all times except when in CPM or when walking.  DO NOT modify, tear, cut, or change the foam block in any way.  POST-OPERATIVE OPIOID TAPER INSTRUCTIONS: It is important to wean off of your opioid medication as soon as possible. If you do not need pain medication after your surgery it is ok to stop day one. Opioids include: Codeine, Hydrocodone(Norco, Vicodin), Oxycodone(Percocet, oxycontin) and hydromorphone amongst others.  Long term and even short term use of opiods can cause: Increased pain response Dependence Constipation Depression Respiratory depression And more.  Withdrawal symptoms can include Flu like symptoms Nausea, vomiting And more Techniques to manage these symptoms Hydrate well Eat regular healthy meals Stay active Use relaxation techniques(deep breathing, meditating, yoga) Do Not substitute Alcohol to help with tapering If you have been on opioids for less than two weeks and do not have pain than it is ok to stop all together.  Plan to wean off of opioids This plan should start within one week post op of your joint replacement. Maintain the same interval or time between taking each dose and first decrease the dose.  Cut the total daily intake of opioids by one tablet each day Next start to increase the time between doses. The last dose that should be eliminated is the evening dose.     MAKE SURE YOU:  Understand  these instructions.  Get help right away if you are not doing well or get worse.    Thank you for letting us be  a part of your medical care team.  It is a privilege we respect greatly.  We hope these instructions will help you stay on track for a fast and full recovery!   Increase activity slowly as tolerated   Complete by: As directed    Post-operative opioid taper instructions:   Complete by: As directed    POST-OPERATIVE OPIOID TAPER INSTRUCTIONS: It is important to wean off of your opioid medication as soon as possible. If you do not need pain medication after your surgery it is ok to stop day one. Opioids include: Codeine, Hydrocodone(Norco, Vicodin), Oxycodone(Percocet, oxycontin) and hydromorphone amongst others.  Long term and even short term use of opiods can cause: Increased pain response Dependence Constipation Depression Respiratory depression And more.  Withdrawal symptoms can include Flu like symptoms Nausea, vomiting And more Techniques to manage these symptoms Hydrate well Eat regular healthy meals Stay active Use relaxation techniques(deep breathing, meditating, yoga) Do Not substitute Alcohol to help with tapering If you have been on opioids for less than two weeks and do not have pain than it is ok to stop all together.  Plan to wean off of opioids This plan should start within one week post op of your joint replacement. Maintain the same interval or time between taking each dose and first decrease the dose.  Cut the total daily intake of opioids by one tablet each day Next start to increase the time between doses. The last dose that should be eliminated is the evening dose.           Follow-up Information     Marcene Corning, MD. Schedule an appointment as soon as possible for a visit in 2 week(s).   Specialty: Orthopedic Surgery Contact information: [address] [place] [address] [phone]                  Signed: Ginger Organ Cieanna Stormes 12/01/2022, 8:27 AM

## 2022-12-01 NOTE — Progress Notes (Signed)
Subjective: 1 Day Post-Op Procedure(s) (LRB): LEFT TOTAL KNEE REVISION (Left)  Patinet doing great. He has no pain. He is looking forward to going home this morning.  Activity level:  wbat Diet tolerance:  ok Voiding:  ok Patient reports pain as mild.    Objective: Vital signs in last 24 hours: Temp:  [96.5 F (35.8 C)-98 F (36.7 C)] 97.4 F (36.3 C) (04/17 0506) Pulse Rate:  [56-75] 61 (04/17 0506) Resp:  [12-18] 16 (04/17 0506) BP: (117-144)/(54-68) 140/65 (04/17 0506) SpO2:  [95 %-100 %] 100 % (04/17 0506) FiO2 (%):  [21 %] 21 % (04/16 2032) Weight:  [101.2 kg] 101.2 kg (04/16 1224)  Labs: No results for input(s): "HGB" in the last 72 hours. No results for input(s): "WBC", "RBC", "HCT", "PLT" in the last 72 hours. No results for input(s): "NA", "K", "CL", "CO2", "BUN", "CREATININE", "GLUCOSE", "CALCIUM" in the last 72 hours. No results for input(s): "LABPT", "INR" in the last 72 hours.  Physical Exam:  Neurologically intact ABD soft Neurovascular intact Sensation intact distally Intact pulses distally Dorsiflexion/Plantar flexion intact Incision: dressing C/D/I and no drainage No cellulitis present Compartment soft  Assessment/Plan:  1 Day Post-Op Procedure(s) (LRB): LEFT TOTAL KNEE REVISION (Left) Advance diet Up with therapy D/C IV fluids Discharge home with home health this morning after PT if cleared and doing well. Follow up in office 2 weeks post op. Continue  asa BID for DVT prevention.    Ginger Organ Akia Desroches 12/01/2022, 8:22 AM

## 2022-12-01 NOTE — Plan of Care (Signed)
Problem: Education: Goal: Knowledge of the prescribed therapeutic regimen will improve Outcome: Adequate for Discharge Goal: Individualized Educational Video(s) Outcome: Adequate for Discharge   Problem: Activity: Goal: Ability to avoid complications of mobility impairment will improve Outcome: Adequate for Discharge Goal: Range of joint motion will improve Outcome: Adequate for Discharge   Problem: Clinical Measurements: Goal: Postoperative complications will be avoided or minimized Outcome: Adequate for Discharge   Problem: Pain Management: Goal: Pain level will decrease with appropriate interventions Outcome: Adequate for Discharge   Problem: Skin Integrity: Goal: Will show signs of wound healing Outcome: Adequate for Discharge   Problem: Education: Goal: Knowledge of General Education information will improve Description: Including pain rating scale, medication(s)/side effects and non-pharmacologic comfort measures Outcome: Adequate for Discharge   Problem: Health Behavior/Discharge Planning: Goal: Ability to manage health-related needs will improve Outcome: Adequate for Discharge   Problem: Clinical Measurements: Goal: Ability to maintain clinical measurements within normal limits will improve Outcome: Adequate for Discharge Goal: Will remain free from infection Outcome: Adequate for Discharge Goal: Diagnostic test results will improve Outcome: Adequate for Discharge Goal: Respiratory complications will improve Outcome: Adequate for Discharge Goal: Cardiovascular complication will be avoided Outcome: Adequate for Discharge   Problem: Activity: Goal: Risk for activity intolerance will decrease Outcome: Adequate for Discharge   Problem: Nutrition: Goal: Adequate nutrition will be maintained Outcome: Adequate for Discharge   Problem: Coping: Goal: Level of anxiety will decrease Outcome: Adequate for Discharge   Problem: Elimination: Goal: Will not  experience complications related to bowel motility Outcome: Adequate for Discharge Goal: Will not experience complications related to urinary retention Outcome: Adequate for Discharge   Problem: Pain Managment: Goal: General experience of comfort will improve Outcome: Adequate for Discharge   Problem: Safety: Goal: Ability to remain free from injury will improve Outcome: Adequate for Discharge   Problem: Skin Integrity: Goal: Risk for impaired skin integrity will decrease Outcome: Adequate for Discharge   Problem: Education: Goal: Knowledge of the prescribed therapeutic regimen will improve Outcome: Adequate for Discharge Goal: Individualized Educational Video(s) Outcome: Adequate for Discharge   Problem: Activity: Goal: Ability to avoid complications of mobility impairment will improve Outcome: Adequate for Discharge Goal: Range of joint motion will improve Outcome: Adequate for Discharge   Problem: Clinical Measurements: Goal: Postoperative complications will be avoided or minimized Outcome: Adequate for Discharge   Problem: Pain Management: Goal: Pain level will decrease with appropriate interventions Outcome: Adequate for Discharge   Problem: Skin Integrity: Goal: Will show signs of wound healing Outcome: Adequate for Discharge   Problem: Education: Goal: Knowledge of General Education information will improve Description: Including pain rating scale, medication(s)/side effects and non-pharmacologic comfort measures Outcome: Adequate for Discharge   Problem: Health Behavior/Discharge Planning: Goal: Ability to manage health-related needs will improve Outcome: Adequate for Discharge   Problem: Clinical Measurements: Goal: Ability to maintain clinical measurements within normal limits will improve Outcome: Adequate for Discharge Goal: Will remain free from infection Outcome: Adequate for Discharge Goal: Diagnostic test results will improve Outcome: Adequate  for Discharge Goal: Respiratory complications will improve Outcome: Adequate for Discharge Goal: Cardiovascular complication will be avoided Outcome: Adequate for Discharge   Problem: Activity: Goal: Risk for activity intolerance will decrease Outcome: Adequate for Discharge   Problem: Nutrition: Goal: Adequate nutrition will be maintained Outcome: Adequate for Discharge   Problem: Coping: Goal: Level of anxiety will decrease Outcome: Adequate for Discharge   Problem: Elimination: Goal: Will not experience complications related to bowel motility Outcome: Adequate for Discharge Goal:   Will not experience complications related to urinary retention Outcome: Adequate for Discharge   Problem: Pain Managment: Goal: General experience of comfort will improve Outcome: Adequate for Discharge   Problem: Safety: Goal: Ability to remain free from injury will improve Outcome: Adequate for Discharge   Problem: Skin Integrity: Goal: Risk for impaired skin integrity will decrease Outcome: Adequate for Discharge   

## 2022-12-01 NOTE — Progress Notes (Signed)
Physical Therapy Treatment Patient Details Name: Ronald Bullock. MRN: [id number] DOB: 02-02-1951 Today's Date: 12/01/2022   History of Present Illness 72 yo male s/p L TK rev 11/30/22. Hx of L TKA 2014, R THA 2014, obesity, vertigo, lumbar fusion 2023    PT Comments    Pt agreeable to therapy. Reviewed/practiced exercises, gait training and stair training. All PT education completed.    Recommendations for follow up therapy are one component of a multi-disciplinary discharge planning process, led by the attending physician.  Recommendations may be updated based on patient status, additional functional criteria and insurance authorization.  Follow Up Recommendations       Assistance Recommended at Discharge Intermittent Supervision/Assistance  Patient can return home with the following Assist for transportation;Assistance with cooking/housework;Help with stairs or ramp for entrance   Equipment Recommendations  None recommended by PT    Recommendations for Other Services       Precautions / Restrictions Precautions Precautions: Fall Restrictions Weight Bearing Restrictions: No LLE Weight Bearing: Weight bearing as tolerated     Mobility  Bed Mobility Overal bed mobility: Modified Independent             General bed mobility comments: increased time.    Transfers Overall transfer level: Needs assistance Equipment used: Rolling walker (2 wheels) Transfers: Sit to/from Stand Sit to Stand: Supervision           General transfer comment: Supv for safety, cues for hand placement.    Ambulation/Gait Ambulation/Gait assistance: Supervision Gait Distance (Feet): 150 Feet Assistive device: Rolling walker (2 wheels) Gait Pattern/deviations: Step-through pattern       General Gait Details: Supv for safety.   Stairs Stairs: Yes Stairs assistance: Supervision Stair Management: One rail Right, Step to pattern Number of Stairs: 3 General stair  comments: Cues for safety, sequencing, technique.   Wheelchair Mobility    Modified Rankin (Stroke Patients Only)       Balance Overall balance assessment: Mild deficits observed, not formally tested         Standing balance support: Reliant on assistive device for balance, During functional activity Standing balance-Leahy Scale: Fair                              Cognition Arousal/Alertness: Awake/alert Behavior During Therapy: WFL for tasks assessed/performed Overall Cognitive Status: Within Functional Limits for tasks assessed                                          Exercises Total Joint Exercises Ankle Circles/Pumps: AROM, Both, 10 reps Quad Sets: AROM, Both, 10 reps Short Arc Quad: AAROM, Left, 10 reps, Supine Long Arc Quad: AROM, Left, 10 reps Knee Flexion: AROM, Left, 10 reps, Seated Goniometric ROM: ~10-75 degrees    General Comments        Pertinent Vitals/Pain Pain Assessment Pain Assessment: 0-10 Pain Score: 5  Pain Location: L knee/thigh Pain Descriptors / Indicators: Discomfort, Aching, Numbness Pain Intervention(s): Monitored during session, Repositioned, Ice applied    Home Living                          Prior Function            PT Goals (current goals can now be found in the care plan section) Progress towards PT  goals: Progressing toward goals    Frequency    7X/week      PT Plan Current plan remains appropriate    Co-evaluation              AM-PAC PT "6 Clicks" Mobility   Outcome Measure  Help needed turning from your back to your side while in a flat bed without using bedrails?: None Help needed moving from lying on your back to sitting on the side of a flat bed without using bedrails?: None Help needed moving to and from a bed to a chair (including a wheelchair)?: None Help needed standing up from a chair using your arms (e.g., wheelchair or bedside chair)?: None Help needed  to walk in hospital room?: A Little Help needed climbing 3-5 steps with a railing? : A Little 6 Click Score: 22    End of Session Equipment Utilized During Treatment: Gait belt Activity Tolerance: Patient tolerated treatment well Patient left: in chair;with call bell/phone within reach;with family/visitor present   PT Visit Diagnosis: Pain;Other abnormalities of gait and mobility (R26.89) Pain - Right/Left: Left Pain - part of body: Knee     Time: 0920-0939 PT Time Calculation (min) (ACUTE ONLY): 19 min  Charges:  $Gait Training: 8-22 mins                       Faye Ramsay, PT Acute Rehabilitation  Office: [phone]

## 2022-12-01 NOTE — TOC Transition Note (Signed)
Transition of Care [facility]) - CM/SW Discharge Note  Patient Details  Name: Ronald Bullock. MRN: [id number] Date of Birth: March 04, 1951  Transition of Care [facility]) CM/SW Contact:  Ewing Schlein, LCSW Phone Number: 12/01/2022, 11:09 AM  Clinical Narrative: Patient is expected to discharge home after working with PT. CSW met with patient to review discharge plan. Patient will go home with HHPT, which was prearranged with [facility]. Patient has a rolling walker, cane, and toilet riser at home so there are no DME needs at this time. TOC signing off.    Final next level of care: Home w Home Health Services Barriers to Discharge: No Barriers Identified  Patient Goals and CMS Choice CMS Medicare.gov Compare Post Acute Care list provided to:: Patient Choice offered to / list presented to : Patient  Discharge Plan and Services Additional resources added to the After Visit Summary for          DME Arranged: N/A DME Agency: NA HH Arranged: PT HH Agency: Optometrist spoke with at [facility] Agency: Prearranged in orthopedist's office  Social Determinants of Health (SDOH) Interventions SDOH Screenings   Food Insecurity: No Food Insecurity (11/30/2022)  Housing: Low Risk  (11/30/2022)  Transportation Needs: No Transportation Needs (11/30/2022)  Utilities: Not At Risk (11/30/2022)  Alcohol Screen: Low Risk  (09/15/2022)  Depression (PHQ2-9): Low Risk  (09/21/2022)  Financial Resource Strain: Low Risk  (09/15/2022)  Physical Activity: Sufficiently Active (09/15/2022)  Social Connections: Moderately Isolated (09/15/2022)  Stress: No Stress Concern Present (09/15/2022)  Tobacco Use: Low Risk  (11/30/2022)   Readmission Risk Interventions     No data to display

## 2022-12-01 NOTE — Anesthesia Postprocedure Evaluation (Signed)
Anesthesia Post Note  Patient: Ronald Bullock.  Procedure(s) Performed: LEFT TOTAL KNEE REVISION (Left: Knee)     Patient location during evaluation: PACU Anesthesia Type: Spinal Level of consciousness: awake and alert Pain management: pain level controlled Vital Signs Assessment: post-procedure vital signs reviewed and stable Respiratory status: spontaneous breathing Cardiovascular status: stable Anesthetic complications: no   No notable events documented.  Last Vitals:  Vitals:   12/01/22 0506 12/01/22 0943  BP: (!) 140/65 (!) 145/67  Pulse: 61 (!) 59  Resp: 16 18  Temp: (!) 36.3 C 36.6 C  SpO2: 100% 99%    Last Pain:  Vitals:   12/01/22 0943  TempSrc: Oral  PainSc:                  [name]

## 2022-12-02 ENCOUNTER — Encounter (HOSPITAL_COMMUNITY): Payer: Self-pay | Admitting: Orthopaedic Surgery

## 2022-12-02 ENCOUNTER — Telehealth: Payer: Self-pay

## 2022-12-02 DIAGNOSIS — M48062 Spinal stenosis, lumbar region with neurogenic claudication: Secondary | ICD-10-CM | POA: Diagnosis not present

## 2022-12-02 DIAGNOSIS — E669 Obesity, unspecified: Secondary | ICD-10-CM | POA: Diagnosis not present

## 2022-12-02 DIAGNOSIS — I77810 Thoracic aortic ectasia: Secondary | ICD-10-CM | POA: Diagnosis not present

## 2022-12-02 DIAGNOSIS — R7303 Prediabetes: Secondary | ICD-10-CM | POA: Diagnosis not present

## 2022-12-02 DIAGNOSIS — Z7982 Long term (current) use of aspirin: Secondary | ICD-10-CM | POA: Diagnosis not present

## 2022-12-02 DIAGNOSIS — Z96651 Presence of right artificial knee joint: Secondary | ICD-10-CM | POA: Diagnosis not present

## 2022-12-02 DIAGNOSIS — M5030 Other cervical disc degeneration, unspecified cervical region: Secondary | ICD-10-CM | POA: Diagnosis not present

## 2022-12-02 DIAGNOSIS — I10 Essential (primary) hypertension: Secondary | ICD-10-CM | POA: Diagnosis not present

## 2022-12-02 DIAGNOSIS — G4733 Obstructive sleep apnea (adult) (pediatric): Secondary | ICD-10-CM | POA: Diagnosis not present

## 2022-12-02 DIAGNOSIS — Z96641 Presence of right artificial hip joint: Secondary | ICD-10-CM | POA: Diagnosis not present

## 2022-12-02 DIAGNOSIS — M25562 Pain in left knee: Secondary | ICD-10-CM | POA: Diagnosis not present

## 2022-12-02 DIAGNOSIS — Z6830 Body mass index (BMI) 30.0-30.9, adult: Secondary | ICD-10-CM | POA: Diagnosis not present

## 2022-12-02 DIAGNOSIS — M109 Gout, unspecified: Secondary | ICD-10-CM | POA: Diagnosis not present

## 2022-12-02 DIAGNOSIS — I452 Bifascicular block: Secondary | ICD-10-CM | POA: Diagnosis not present

## 2022-12-02 DIAGNOSIS — E785 Hyperlipidemia, unspecified: Secondary | ICD-10-CM | POA: Diagnosis not present

## 2022-12-02 DIAGNOSIS — G5621 Lesion of ulnar nerve, right upper limb: Secondary | ICD-10-CM | POA: Diagnosis not present

## 2022-12-02 DIAGNOSIS — M150 Primary generalized (osteo)arthritis: Secondary | ICD-10-CM | POA: Diagnosis not present

## 2022-12-02 DIAGNOSIS — Z471 Aftercare following joint replacement surgery: Secondary | ICD-10-CM | POA: Diagnosis not present

## 2022-12-02 DIAGNOSIS — T8484XD Pain due to internal orthopedic prosthetic devices, implants and grafts, subsequent encounter: Secondary | ICD-10-CM | POA: Diagnosis not present

## 2022-12-02 DIAGNOSIS — K146 Glossodynia: Secondary | ICD-10-CM | POA: Diagnosis not present

## 2022-12-02 DIAGNOSIS — I679 Cerebrovascular disease, unspecified: Secondary | ICD-10-CM | POA: Diagnosis not present

## 2022-12-02 NOTE — Transitions of Care (Post Inpatient/ED Visit) (Signed)
   12/02/2022  Name: Ronald Bullock. MRN: [id number] DOB: 03/27/1951  Today's TOC FU Call Status: Today's TOC FU Call Status:: Successful TOC FU Call Competed TOC FU Call Complete Date: 12/02/22  Transition Care Management Follow-up Telephone Call Date of Discharge: 12/01/22 Discharge Facility: [facility]) Type of Discharge: Inpatient Admission Primary Inpatient Discharge Diagnosis:: revision of lt total knee How have you been since you were released from the hospital?: Better (I have had very little pain and I have only needed to take tylenol) Any questions or concerns?: No  Items Reviewed: Did you receive and understand the discharge instructions provided?: Yes Medications obtained and verified?: Yes (Medications Reviewed) Any new allergies since your discharge?: No Dietary orders reviewed?: NA Do you have support at home?: Yes People in Home: spouse Name of Support/Comfort Primary Source: wife Helmut Hennon  Home Care and Equipment/Supplies: Were Home Health Services Ordered?: Yes Name of Home Health Agency:: [facility] Has Agency set up a time to come to your home?: Yes First Home Health Visit Date: 12/02/22 (Has already visited with pt today) Any new equipment or medical supplies ordered?: NA (Pt already has rolling walker, cane and toilet riser)  Functional Questionnaire: Do you need assistance with bathing/showering or dressing?: No Do you need assistance with meal preparation?: No Do you need assistance with eating?: No Do you have difficulty maintaining continence: No Do you need assistance with getting out of bed/getting out of a chair/moving?: No Do you have difficulty managing or taking your medications?: No  Follow up appointments reviewed: PCP Follow-up appointment confirmed?: NA (Declines PCP follow up as has follow up with specialist) Specialist Hospital Follow-up appointment confirmed?: Yes Date of Specialist follow-up appointment?:  12/13/22 Follow-Up Specialty Provider:: Dr. Jerl Santos Do you need transportation to your follow-up appointment?: No Do you understand care options if your condition(s) worsen?: Yes-patient verbalized understanding  SDOH Interventions Today    Flowsheet Row Most Recent Value  SDOH Interventions   Food Insecurity Interventions Intervention Not Indicated  Transportation Interventions Intervention Not Indicated      TOC Interventions Today    Flowsheet Row Most Recent Value  TOC Interventions   TOC Interventions Discussed/Reviewed TOC Interventions Discussed, Post discharge activity limitations per provider, Post op wound/incision care, S/S of infection       Interventions Today    Flowsheet Row Most Recent Value  General Interventions   General Interventions Discussed/Reviewed General Interventions Discussed, Doctor Visits  Doctor Visits Discussed/Reviewed Doctor Visits Discussed, PCP, Specialist  Education Interventions   Education Provided Provided Education  Provided Verbal Education On Other  [Reviewed constipation prevention]  Pharmacy Interventions   Pharmacy Dicussed/Reviewed Pharmacy Topics Discussed  Safety Interventions   Safety Discussed/Reviewed Safety Discussed, Home Safety  Home Safety Assistive Devices        SIGNATURE Dudley Major RN, [place], CDE Care Management Coordinator [facility] Care Management [phone]

## 2022-12-04 DIAGNOSIS — M150 Primary generalized (osteo)arthritis: Secondary | ICD-10-CM | POA: Diagnosis not present

## 2022-12-04 DIAGNOSIS — I452 Bifascicular block: Secondary | ICD-10-CM | POA: Diagnosis not present

## 2022-12-04 DIAGNOSIS — T8484XD Pain due to internal orthopedic prosthetic devices, implants and grafts, subsequent encounter: Secondary | ICD-10-CM | POA: Diagnosis not present

## 2022-12-04 DIAGNOSIS — I10 Essential (primary) hypertension: Secondary | ICD-10-CM | POA: Diagnosis not present

## 2022-12-04 DIAGNOSIS — M48062 Spinal stenosis, lumbar region with neurogenic claudication: Secondary | ICD-10-CM | POA: Diagnosis not present

## 2022-12-04 DIAGNOSIS — Z471 Aftercare following joint replacement surgery: Secondary | ICD-10-CM | POA: Diagnosis not present

## 2022-12-06 DIAGNOSIS — I452 Bifascicular block: Secondary | ICD-10-CM | POA: Diagnosis not present

## 2022-12-06 DIAGNOSIS — T8484XD Pain due to internal orthopedic prosthetic devices, implants and grafts, subsequent encounter: Secondary | ICD-10-CM | POA: Diagnosis not present

## 2022-12-06 DIAGNOSIS — M48062 Spinal stenosis, lumbar region with neurogenic claudication: Secondary | ICD-10-CM | POA: Diagnosis not present

## 2022-12-06 DIAGNOSIS — Z471 Aftercare following joint replacement surgery: Secondary | ICD-10-CM | POA: Diagnosis not present

## 2022-12-06 DIAGNOSIS — I10 Essential (primary) hypertension: Secondary | ICD-10-CM | POA: Diagnosis not present

## 2022-12-06 DIAGNOSIS — M150 Primary generalized (osteo)arthritis: Secondary | ICD-10-CM | POA: Diagnosis not present

## 2022-12-08 DIAGNOSIS — T8484XD Pain due to internal orthopedic prosthetic devices, implants and grafts, subsequent encounter: Secondary | ICD-10-CM | POA: Diagnosis not present

## 2022-12-08 DIAGNOSIS — I10 Essential (primary) hypertension: Secondary | ICD-10-CM | POA: Diagnosis not present

## 2022-12-08 DIAGNOSIS — M48062 Spinal stenosis, lumbar region with neurogenic claudication: Secondary | ICD-10-CM | POA: Diagnosis not present

## 2022-12-08 DIAGNOSIS — Z471 Aftercare following joint replacement surgery: Secondary | ICD-10-CM | POA: Diagnosis not present

## 2022-12-08 DIAGNOSIS — I452 Bifascicular block: Secondary | ICD-10-CM | POA: Diagnosis not present

## 2022-12-08 DIAGNOSIS — M150 Primary generalized (osteo)arthritis: Secondary | ICD-10-CM | POA: Diagnosis not present

## 2022-12-10 DIAGNOSIS — I452 Bifascicular block: Secondary | ICD-10-CM | POA: Diagnosis not present

## 2022-12-10 DIAGNOSIS — T8484XD Pain due to internal orthopedic prosthetic devices, implants and grafts, subsequent encounter: Secondary | ICD-10-CM | POA: Diagnosis not present

## 2022-12-10 DIAGNOSIS — Z471 Aftercare following joint replacement surgery: Secondary | ICD-10-CM | POA: Diagnosis not present

## 2022-12-10 DIAGNOSIS — I10 Essential (primary) hypertension: Secondary | ICD-10-CM | POA: Diagnosis not present

## 2022-12-10 DIAGNOSIS — M25562 Pain in left knee: Secondary | ICD-10-CM | POA: Diagnosis not present

## 2022-12-10 DIAGNOSIS — M150 Primary generalized (osteo)arthritis: Secondary | ICD-10-CM | POA: Diagnosis not present

## 2022-12-10 DIAGNOSIS — M48062 Spinal stenosis, lumbar region with neurogenic claudication: Secondary | ICD-10-CM | POA: Diagnosis not present

## 2022-12-13 DIAGNOSIS — T8484XD Pain due to internal orthopedic prosthetic devices, implants and grafts, subsequent encounter: Secondary | ICD-10-CM | POA: Diagnosis not present

## 2022-12-13 DIAGNOSIS — Z471 Aftercare following joint replacement surgery: Secondary | ICD-10-CM | POA: Diagnosis not present

## 2022-12-13 DIAGNOSIS — I452 Bifascicular block: Secondary | ICD-10-CM | POA: Diagnosis not present

## 2022-12-13 DIAGNOSIS — I10 Essential (primary) hypertension: Secondary | ICD-10-CM | POA: Diagnosis not present

## 2022-12-13 DIAGNOSIS — M150 Primary generalized (osteo)arthritis: Secondary | ICD-10-CM | POA: Diagnosis not present

## 2022-12-13 DIAGNOSIS — M48062 Spinal stenosis, lumbar region with neurogenic claudication: Secondary | ICD-10-CM | POA: Diagnosis not present

## 2022-12-15 DIAGNOSIS — M25662 Stiffness of left knee, not elsewhere classified: Secondary | ICD-10-CM | POA: Diagnosis not present

## 2022-12-15 DIAGNOSIS — M6281 Muscle weakness (generalized): Secondary | ICD-10-CM | POA: Diagnosis not present

## 2022-12-15 DIAGNOSIS — Z96652 Presence of left artificial knee joint: Secondary | ICD-10-CM | POA: Diagnosis not present

## 2022-12-17 DIAGNOSIS — M25662 Stiffness of left knee, not elsewhere classified: Secondary | ICD-10-CM | POA: Diagnosis not present

## 2022-12-17 DIAGNOSIS — M6281 Muscle weakness (generalized): Secondary | ICD-10-CM | POA: Diagnosis not present

## 2022-12-17 DIAGNOSIS — Z96652 Presence of left artificial knee joint: Secondary | ICD-10-CM | POA: Diagnosis not present

## 2022-12-20 DIAGNOSIS — Z96652 Presence of left artificial knee joint: Secondary | ICD-10-CM | POA: Diagnosis not present

## 2022-12-20 DIAGNOSIS — M25662 Stiffness of left knee, not elsewhere classified: Secondary | ICD-10-CM | POA: Diagnosis not present

## 2022-12-20 DIAGNOSIS — M6281 Muscle weakness (generalized): Secondary | ICD-10-CM | POA: Diagnosis not present

## 2022-12-22 DIAGNOSIS — M25662 Stiffness of left knee, not elsewhere classified: Secondary | ICD-10-CM | POA: Diagnosis not present

## 2022-12-22 DIAGNOSIS — M6281 Muscle weakness (generalized): Secondary | ICD-10-CM | POA: Diagnosis not present

## 2022-12-22 DIAGNOSIS — Z96652 Presence of left artificial knee joint: Secondary | ICD-10-CM | POA: Diagnosis not present

## 2022-12-24 DIAGNOSIS — Z96652 Presence of left artificial knee joint: Secondary | ICD-10-CM | POA: Diagnosis not present

## 2022-12-24 DIAGNOSIS — M25662 Stiffness of left knee, not elsewhere classified: Secondary | ICD-10-CM | POA: Diagnosis not present

## 2022-12-24 DIAGNOSIS — M6281 Muscle weakness (generalized): Secondary | ICD-10-CM | POA: Diagnosis not present

## 2022-12-27 DIAGNOSIS — Z96652 Presence of left artificial knee joint: Secondary | ICD-10-CM | POA: Diagnosis not present

## 2022-12-27 DIAGNOSIS — M25662 Stiffness of left knee, not elsewhere classified: Secondary | ICD-10-CM | POA: Diagnosis not present

## 2022-12-27 DIAGNOSIS — M6281 Muscle weakness (generalized): Secondary | ICD-10-CM | POA: Diagnosis not present

## 2022-12-29 DIAGNOSIS — Z96652 Presence of left artificial knee joint: Secondary | ICD-10-CM | POA: Diagnosis not present

## 2022-12-29 DIAGNOSIS — M25662 Stiffness of left knee, not elsewhere classified: Secondary | ICD-10-CM | POA: Diagnosis not present

## 2022-12-29 DIAGNOSIS — M6281 Muscle weakness (generalized): Secondary | ICD-10-CM | POA: Diagnosis not present

## 2022-12-31 DIAGNOSIS — M25662 Stiffness of left knee, not elsewhere classified: Secondary | ICD-10-CM | POA: Diagnosis not present

## 2022-12-31 DIAGNOSIS — Z96652 Presence of left artificial knee joint: Secondary | ICD-10-CM | POA: Diagnosis not present

## 2022-12-31 DIAGNOSIS — M6281 Muscle weakness (generalized): Secondary | ICD-10-CM | POA: Diagnosis not present

## 2023-01-03 DIAGNOSIS — M25662 Stiffness of left knee, not elsewhere classified: Secondary | ICD-10-CM | POA: Diagnosis not present

## 2023-01-03 DIAGNOSIS — M6281 Muscle weakness (generalized): Secondary | ICD-10-CM | POA: Diagnosis not present

## 2023-01-03 DIAGNOSIS — Z96652 Presence of left artificial knee joint: Secondary | ICD-10-CM | POA: Diagnosis not present

## 2023-01-05 DIAGNOSIS — Z96652 Presence of left artificial knee joint: Secondary | ICD-10-CM | POA: Diagnosis not present

## 2023-01-05 DIAGNOSIS — M6281 Muscle weakness (generalized): Secondary | ICD-10-CM | POA: Diagnosis not present

## 2023-01-05 DIAGNOSIS — M25662 Stiffness of left knee, not elsewhere classified: Secondary | ICD-10-CM | POA: Diagnosis not present

## 2023-01-06 ENCOUNTER — Emergency Department (HOSPITAL_COMMUNITY): Payer: Medicare Other

## 2023-01-06 ENCOUNTER — Ambulatory Visit (HOSPITAL_COMMUNITY)
Admission: EM | Admit: 2023-01-06 | Discharge: 2023-01-06 | Disposition: A | Discharge status: Home / Self Care | Payer: Medicare Other

## 2023-01-06 ENCOUNTER — Encounter (HOSPITAL_COMMUNITY): Payer: Self-pay | Admitting: Emergency Medicine

## 2023-01-06 ENCOUNTER — Emergency Department (HOSPITAL_COMMUNITY)
Admission: EM | Admit: 2023-01-06 | Discharge: 2023-01-06 | Disposition: A | Discharge status: Home / Self Care | Payer: Medicare Other | Attending: Student | Admitting: Student

## 2023-01-06 DIAGNOSIS — R42 Dizziness and giddiness: Secondary | ICD-10-CM | POA: Diagnosis not present

## 2023-01-06 DIAGNOSIS — R519 Headache, unspecified: Secondary | ICD-10-CM | POA: Diagnosis not present

## 2023-01-06 DIAGNOSIS — Z96642 Presence of left artificial hip joint: Secondary | ICD-10-CM | POA: Diagnosis not present

## 2023-01-06 DIAGNOSIS — R55 Syncope and collapse: Secondary | ICD-10-CM | POA: Diagnosis not present

## 2023-01-06 DIAGNOSIS — Z79899 Other long term (current) drug therapy: Secondary | ICD-10-CM | POA: Diagnosis not present

## 2023-01-06 DIAGNOSIS — I10 Essential (primary) hypertension: Secondary | ICD-10-CM | POA: Insufficient documentation

## 2023-01-06 DIAGNOSIS — Z96652 Presence of left artificial knee joint: Secondary | ICD-10-CM | POA: Diagnosis not present

## 2023-01-06 DIAGNOSIS — Z7982 Long term (current) use of aspirin: Secondary | ICD-10-CM | POA: Insufficient documentation

## 2023-01-06 LAB — CBC
HCT: 33.2 % — ABNORMAL LOW (ref 39.0–52.0)
Hemoglobin: 10.8 g/dL — ABNORMAL LOW (ref 13.0–17.0)
MCH: 29.9 pg (ref 26.0–34.0)
MCHC: 32.5 g/dL (ref 30.0–36.0)
MCV: 92 fL (ref 80.0–100.0)
Platelets: 207 10*3/uL (ref 150–400)
RBC: 3.61 MIL/uL — ABNORMAL LOW (ref 4.22–5.81)
RDW: 14.1 % (ref 11.5–15.5)
WBC: 6.4 10*3/uL (ref 4.0–10.5)
nRBC: 0 % (ref 0.0–0.2)

## 2023-01-06 LAB — DIFFERENTIAL
Abs Immature Granulocytes: 0.03 10*3/uL (ref 0.00–0.07)
Basophils Absolute: 0 10*3/uL (ref 0.0–0.1)
Basophils Relative: 1 %
Eosinophils Absolute: 0.2 10*3/uL (ref 0.0–0.5)
Eosinophils Relative: 3 %
Immature Granulocytes: 1 %
Lymphocytes Relative: 26 %
Lymphs Abs: 1.7 10*3/uL (ref 0.7–4.0)
Monocytes Absolute: 0.6 10*3/uL (ref 0.1–1.0)
Monocytes Relative: 10 %
Neutro Abs: 3.9 10*3/uL (ref 1.7–7.7)
Neutrophils Relative %: 59 %

## 2023-01-06 LAB — PROTIME-INR
INR: 1 (ref 0.8–1.2)
Prothrombin Time: 13.7 seconds (ref 11.4–15.2)

## 2023-01-06 LAB — I-STAT CHEM 8, ED
BUN: 25 mg/dL — ABNORMAL HIGH (ref 8–23)
Calcium, Ion: 1.08 mmol/L — ABNORMAL LOW (ref 1.15–1.40)
Chloride: 103 mmol/L (ref 98–111)
Creatinine, Ser: 1.1 mg/dL (ref 0.61–1.24)
Glucose, Bld: 157 mg/dL — ABNORMAL HIGH (ref 70–99)
HCT: 31 % — ABNORMAL LOW (ref 39.0–52.0)
Hemoglobin: 10.5 g/dL — ABNORMAL LOW (ref 13.0–17.0)
Potassium: 4.6 mmol/L (ref 3.5–5.1)
Sodium: 137 mmol/L (ref 135–145)
TCO2: 23 mmol/L (ref 22–32)

## 2023-01-06 LAB — COMPREHENSIVE METABOLIC PANEL
ALT: 18 U/L (ref 0–44)
AST: 20 U/L (ref 15–41)
Albumin: 3.5 g/dL (ref 3.5–5.0)
Alkaline Phosphatase: 79 U/L (ref 38–126)
Anion gap: 9 (ref 5–15)
BUN: 22 mg/dL (ref 8–23)
CO2: 23 mmol/L (ref 22–32)
Calcium: 8.7 mg/dL — ABNORMAL LOW (ref 8.9–10.3)
Chloride: 103 mmol/L (ref 98–111)
Creatinine, Ser: 1.06 mg/dL (ref 0.61–1.24)
GFR, Estimated: >60 mL/min (ref >60)
Glucose, Bld: 157 mg/dL — ABNORMAL HIGH (ref 70–99)
Potassium: 4.2 mmol/L (ref 3.5–5.1)
Sodium: 135 mmol/L (ref 135–145)
Total Bilirubin: 0.8 mg/dL (ref 0.3–1.2)
Total Protein: 6.5 g/dL (ref 6.5–8.1)

## 2023-01-06 LAB — POC OCCULT BLOOD, ED: Fecal Occult Bld: NEGATIVE

## 2023-01-06 LAB — ETHANOL: Alcohol, Ethyl (B): <10 mg/dL (ref <10)

## 2023-01-06 LAB — TSH: TSH: 2.387 u[IU]/mL (ref 0.350–4.500)

## 2023-01-06 LAB — APTT: aPTT: 28 seconds (ref 24–36)

## 2023-01-06 LAB — CBG MONITORING, ED: Glucose-Capillary: 166 mg/dL — ABNORMAL HIGH (ref 70–99)

## 2023-01-06 MED ORDER — LORAZEPAM 2 MG/ML IJ SOLN: 1.0000 mg | Freq: Once | INTRAMUSCULAR | Status: DC

## 2023-01-06 MED ORDER — SODIUM CHLORIDE 0.9% FLUSH: 3.0000 mL | Freq: Once | INTRAVENOUS | Status: DC

## 2023-01-06 NOTE — ED Triage Notes (Signed)
Pt reports dizziness (swimmy headed) for the past few days, and noticed blurry vision today. States the blurry vision resolved. Denies chest pain and SOB.

## 2023-01-06 NOTE — ED Triage Notes (Signed)
Pt referred to ED from UC for dizziness, headache, and blurry vision that started while going about daily routine this morning. Pt denies any weakness. States that he's had several of these episodes over the last week. Headache gradual onset, rated pain 2/10. States symptoms have resolved during triage.

## 2023-01-06 NOTE — ED Notes (Signed)
Patient is being discharged from the Urgent Care and sent to the Emergency Department via POV . Per Lurena Joiner PA, patient is in need of higher level of care due to dizziness and blurry vision. Patient is aware and verbalizes understanding of plan of care.  Vitals:   01/06/23 1051  BP: 98/79  Pulse: 74  Resp: 18  Temp: (!) 97.5 F (36.4 C)  SpO2: 100%

## 2023-01-06 NOTE — ED Provider Notes (Signed)
[place] EMERGENCY DEPARTMENT AT [facility] Provider Note  CSN: [id number] Arrival date & time: 01/06/23 1107  Chief Complaint(s) Dizziness and Headache  HPI Ronald Bullock. is a 72 y.o. male with PMH HTN, HLD, spinal stenosis status postsurgical repair, OSA on CPAP who presents emergency room for evaluation of headache, dizziness and presyncope.  Patient states that he was at work today when, while seated he started to develop a mild frontal headache and felt "swimmy".  He then stood up and tried to walk and felt very abnormal with associated dizziness and lightheadedness.  He sat down in his car and started to have some blurry vision and asked to be taken to urgent care.  While at urgent care, they transferred him here to the emergency department for further evaluation.  Here in the emergency department, patient states symptoms have completely resolved and the entire episode lasted about 15 minutes.  He states that he has had a few of these episodes over the last few months and presents emergency department due to concern for possible stroke versus TIA.  During his episodes he had no chest pain, shortness of breath, abdominal pain, nausea, vomiting, diaphoresis or other systemic symptoms.  No numbness, tingling, weakness or other neurologic complaints.  Of note, patient states that he is traditionally very compliant with his home CPAP but over the last few months he has had significant difficulty sleeping and has not been compliant with CPAP.   Past Medical History Past Medical History:  Diagnosis Date   Arthritis    L end stage knee DKD, mild R knee DJD, R hip end stage DJD   DDD (degenerative disc disease), cervical    multilevel   Gout    rare flares   HLD (hyperlipidemia)    Low HDL   Hyperglycemia    Hypertension    Lumbar spinal stenosis 11/15/2014   severe central canal stenosis L3/4;L4/5 bilat facet hypertrophy, annular disc bulging, grade 1 sphondylolisthesis  (Dumonski) planned PT and ESI (Wang)   Lumbar spinal stenosis    s/p lumbar decompressive surgery 11/2021 (Dumonski)   OSA on CPAP    Osteoarthritis    R knee and hip, s/p L TKR   Pre-diabetes    Patient Active Problem List   Diagnosis Date Noted   S/P revision of total knee, left 11/30/2022   History of revision of total replacement of left knee joint 11/30/2022   Painful total knee replacement, left (HCC) 09/23/2022   Advanced directives, counseling/discussion 09/21/2022   Right flank pain 05/12/2022   Ascending aorta dilatation (HCC) 12/09/2021   Radiculopathy 12/09/2021   Pre-op evaluation 12/07/2021   Bifascicular block 12/07/2021   S/P joint replacement 09/16/2021   Small vessel disease, cerebrovascular 05/07/2019   Lumbar stenosis with neurogenic claudication 01/22/2017   Encounter for preoperative assessment for noncoronary cardiac surgery 08/20/2016   Burning mouth syndrome 04/02/2016   Right hand pain 03/08/2014   Ulnar neuropathy at wrist 07/11/2012   Ulnar neuropathy at elbow 07/11/2012   Vertigo 02/28/2012   Right foot pain 07/23/2011   Hyperlipidemia with target LDL less than 100    Osteoarthritis, multiple sites    OSA (obstructive sleep apnea)    Obesity, Class I, BMI 30-34.9 09/30/2010   Gout 09/24/2009   ANKLE PAIN, LEFT 09/03/2008   Essential hypertension 04/25/2007   Prediabetes 04/16/2004   Home Medication(s) Prior to Admission medications   Medication Sig Start Date End Date Taking? Authorizing Provider  allopurinol (ZYLOPRIM) 300  MG tablet Take 1 tablet (300 mg total) by mouth daily. 09/21/22   [name], MD  aspirin 81 MG chewable tablet Chew 1 tablet (81 mg total) by mouth 2 (two) times daily. For 2 weeks then once a day for 2 weeks for blood clot prevention. 12/01/22   Elodia Florence, PA-C  gabapentin (NEURONTIN) 300 MG capsule Take 1 capsule (300 mg total) by mouth at bedtime as needed (Nerve pan). 09/23/22   [name], MD   HYDROcodone-acetaminophen (NORCO/VICODIN) 5-325 MG tablet Take 1-2 tablets by mouth every 6 (six) hours as needed for severe pain or moderate pain (pain scpost op pain). 12/01/22   Elodia Florence, PA-C  lisinopril (ZESTRIL) 5 MG tablet Take 1 tablet (5 mg total) by mouth daily. 09/21/22   [name], MD  Multiple Vitamins-Minerals (CENTRUM SILVER 50+MEN PO) Take 1 tablet by mouth daily.    [provider]  NON FORMULARY Pt uses a cpap nightly    [provider]  Omega 3-6-9 Fatty Acids (OMEGA 3-6-9 PO) Take 1,000 mg by mouth in the morning and at bedtime.    [provider]  oxymetazoline (AFRIN) 0.05 % nasal spray Place 1 spray into both nostrils daily as needed for congestion.    [provider]  Respiratory Therapy Supplies (CARETOUCH CPAP & BIPAP HOSE) MISC by Does not apply route.    [provider]  tiZANidine (ZANAFLEX) 4 MG tablet Take 1 tablet (4 mg total) by mouth every 6 (six) hours as needed for muscle spasms. 12/01/22 12/01/23  Elodia Florence, PA-C                                                                                                                                    Past Surgical History Past Surgical History:  Procedure Laterality Date   cardiolyte  1997   WNL   carotid US  1997   WNL   CATARACT EXTRACTION W/PHACO Left 12/24/2014   Procedure: CATARACT EXTRACTION PHACO AND INTRAOCULAR LENS PLACEMENT (IOC);  Surgeon: Galen Manila, MD; [place] 01:16AP% 26.4Cde 20.17   COLONOSCOPY  1998   WNL   COLONOSCOPY  06/2010   WNL, internal hemorrhoids, rec rpt 10 yrs   COLONOSCOPY  07/2021   TAx3, HPx1, int hem, rpt 5 yrs Marina Goodell)   CT HEAD LIMITED W/CM  1997   WNL   ESOPHAGOGASTRODUODENOSCOPY  1998   gastric polyps, benign   JOINT REPLACEMENT  1968   Left Knee, cartilage removed   KNEE ARTHROSCOPY  1968   cartilage removal   LUMBAR EPIDURAL INJECTION  02/2017   L3-4 interlaminar injection Regino Schultze)   MRI lumbar  2000   mild bulge  L3/4, mild foraminal narrowing L4/5, L5/S2, small disk herniation   MRI lumbar  11/2014   severe L3/4, L4/5 central canal stenosis (Dumonski)   SHOULDER SURGERY Right 05/2019   [facility]ler   TOTAL HIP ARTHROPLASTY Right 03/27/2013   TOTAL  HIP ARTHROPLASTY ANTERIOR APPROACH;  Surgeon: Velna Ochs, MD   TOTAL KNEE ARTHROPLASTY Left 09/26/2012   TOTAL KNEE ARTHROPLASTY;  Surgeon: Velna Ochs, MD   TOTAL KNEE REVISION Left 11/30/2022   Procedure: LEFT TOTAL KNEE REVISION;  Surgeon: Marcene Corning, MD;  Location: [facility];  Service: Orthopedics;  Laterality: Left;   TRANSFORAMINAL LUMBAR INTERBODY FUSION (TLIF) WITH PEDICLE SCREW FIXATION 2 LEVEL Left 12/09/2021   Procedure: LEFT-SIDED LUMBAR 3- LUMBAR 4, LUMBAR 4- LUMBAR 5 TRANSFORAMINAL INTERBODY FUSION AND DECOMPRESSION WITH INSTRUMENTATION AND ALLOGRAFT;  Surgeon: Estill Bamberg, MD;  Location: [facility] OR;  Service: Orthopedics;  Laterality: Left;   TRANSTHORACIC ECHOCARDIOGRAM  12/08/2021   Normal LV size and function.  EF 55-60 %.  No RWMA.  Mild asymmetric basal septal LVH.  Normal diastolic function.  Mild RV dilation with normal function.  Normal MV, mild AOV calcification-sclerosis but no stenosis.   Family History Family History  Problem Relation Age of Onset   Asthma Mother    Cancer Mother        Vaginal; radiation dz of bowel   Stroke Father 25   Diabetes Father    Obesity Sister    Esophageal cancer Neg Hx    Stomach cancer Neg Hx    Rectal cancer Neg Hx    Colon cancer Neg Hx    Heart attack Neg Hx    CAD Neg Hx     Social History Social History   Tobacco Use   Smoking status: Never   Smokeless tobacco: Never  Vaping Use   Vaping Use: Never used  Substance Use Topics   Alcohol use: Yes    Comment: Rare   Drug use: No   Allergies Ibuprofen, Meloxicam, and Omeprazole  Review of Systems Review of Systems  Eyes:  Positive for visual disturbance.  Neurological:  Positive for dizziness, light-headedness and  headaches.    Physical Exam Vital Signs  I have reviewed the triage vital signs BP 107/86 (BP Location: Left Arm)   Pulse 73   Temp 97.8 F (36.6 C)   Resp 18   SpO2 100%   Physical Exam Constitutional:      General: He is not in acute distress.    Appearance: Normal appearance.  HENT:     Head: Normocephalic and atraumatic.     Nose: No congestion or rhinorrhea.  Eyes:     General:        Right eye: No discharge.        Left eye: No discharge.     Extraocular Movements: Extraocular movements intact.     Pupils: Pupils are equal, round, and reactive to light.  Cardiovascular:     Rate and Rhythm: Normal rate and regular rhythm.     Heart sounds: No murmur heard. Pulmonary:     Effort: No respiratory distress.     Breath sounds: No wheezing or rales.  Abdominal:     General: There is no distension.     Tenderness: There is no abdominal tenderness.  Musculoskeletal:        General: Normal range of motion.     Cervical back: Normal range of motion.  Skin:    General: Skin is warm and dry.  Neurological:     General: No focal deficit present.     Mental Status: He is alert.     Cranial Nerves: No cranial nerve deficit.     Sensory: No sensory deficit.     Motor: No weakness.  ED Results and Treatments Labs (all labs ordered are listed, but only abnormal results are displayed) Labs Reviewed  CBC - Abnormal; Notable for the following components:      Result Value   RBC 3.61 (*)    Hemoglobin 10.8 (*)    HCT 33.2 (*)    All other components within normal limits  COMPREHENSIVE METABOLIC PANEL - Abnormal; Notable for the following components:   Glucose, Bld 157 (*)    Calcium 8.7 (*)    All other components within normal limits  I-STAT CHEM 8, ED - Abnormal; Notable for the following components:   BUN 25 (*)    Glucose, Bld 157 (*)    Calcium, Ion 1.08 (*)    Hemoglobin 10.5 (*)    HCT 31.0 (*)    All other components within normal limits  CBG  MONITORING, ED - Abnormal; Notable for the following components:   Glucose-Capillary 166 (*)    All other components within normal limits  PROTIME-INR  APTT  DIFFERENTIAL  ETHANOL                                                                                                                          Radiology CT HEAD WO CONTRAST  Result Date: 01/06/2023 CLINICAL DATA:  Headache and dizziness. EXAM: CT HEAD WITHOUT CONTRAST TECHNIQUE: Contiguous axial images were obtained from the base of the skull through the vertex without intravenous contrast. RADIATION DOSE REDUCTION: This exam was performed according to the departmental dose-optimization program which includes automated exposure control, adjustment of the mA and/or kV according to patient size and/or use of iterative reconstruction technique. COMPARISON:  MRI brain dated April 05, 2019. CT head dated November 18, 2008. FINDINGS: Brain: No evidence of acute infarction, hemorrhage, hydrocephalus, extra-axial collection or mass lesion/mass effect. Vascular: Atherosclerotic vascular calcification of the carotid siphons. No hyperdense vessel. Skull: Normal. Negative for fracture or focal lesion. Sinuses/Orbits: No acute finding. Other: None. IMPRESSION: 1. No acute intracranial abnormality. Electronically Signed   By: Obie Dredge M.D.   On: 01/06/2023 12:44    Pertinent labs & imaging results that were available during my care of the patient were reviewed by me and considered in my medical decision making (see MDM for details).  Medications Ordered in ED Medications  sodium chloride flush (NS) 0.9 % injection 3 mL (has no administration in time range)  Procedures Procedures  (including critical care time)  Medical Decision Making / ED Course   This patient presents to the ED for concern of headache,  dizziness, blurry vision, lightheadedness, this involves an extensive number of treatment options, and is a complaint that carries with it a high risk of complications and morbidity.  The differential diagnosis includes orthostatic presyncope, cardiogenic presyncope, vasovagal presyncope, TIA, CVA, electrolyte abnormality, symptomatic anemia, hypercarbia  MDM: Patient seen emergency room for evaluation of multiple complaints described above.  Physical exam is reassuring with no focal motor or sensory deficits, no cranial nerve deficits, cardiopulmonary exam unremarkable.  Laboratory evaluation is overall reassuring outside of a mild hemoglobin drop from 13.0-10.8.  Hemoccult is negative.  CT head is unremarkable.  I did speak with Dr. Selina Cooley of neurology who agrees that this sounds more presyncopal and is not consistent with TIA and thus advanced imaging including CT angiography or MRI would likely not be beneficial in this setting.  Given that patient's symptoms appear to correlate almost exactly from when he stopped using his CPAP consistently, we had a long shared decision-making discussion about next steps and patient states that for the next few days he will be consistent with his CPAP and monitor himself for improvement.  He will also contact his PCP for repeat blood work to ensure stabilization of his anemia.  At this time with symptoms resolved he does not meet inpatient criteria for admission and he is safe for discharge with outpatient follow-up.  Patient given strict return precautions of which he and his wife voiced understanding.  Of note, patient does have a history of a dilated aortic root on echo and this was mentioned in the urgent care note prior to transfer.  I have low suspicion that this is at play today as the patient did not have any chest pain, shortness of breath, any neurologic symptoms during this event.  Regardless, we did speak about this and he already has follow-up to have this  reimaged in the outpatient setting.  Thus we did not pursue acute aortic imaging at this time.   Additional history obtained: -Additional history obtained from wife -External records from outside source obtained and reviewed including: Chart review including previous notes, labs, imaging, consultation notes   Lab Tests: -I ordered, reviewed, and interpreted labs.   The pertinent results include:   Labs Reviewed  CBC - Abnormal; Notable for the following components:      Result Value   RBC 3.61 (*)    Hemoglobin 10.8 (*)    HCT 33.2 (*)    All other components within normal limits  COMPREHENSIVE METABOLIC PANEL - Abnormal; Notable for the following components:   Glucose, Bld 157 (*)    Calcium 8.7 (*)    All other components within normal limits  I-STAT CHEM 8, ED - Abnormal; Notable for the following components:   BUN 25 (*)    Glucose, Bld 157 (*)    Calcium, Ion 1.08 (*)    Hemoglobin 10.5 (*)    HCT 31.0 (*)    All other components within normal limits  CBG MONITORING, ED - Abnormal; Notable for the following components:   Glucose-Capillary 166 (*)    All other components within normal limits  PROTIME-INR  APTT  DIFFERENTIAL  ETHANOL      EKG   EKG Interpretation  Date/Time:  Thursday Jan 06 2023 11:22:39 EDT Ventricular Rate:  71 PR Interval:  236 QRS Duration: 128 QT Interval:  406  QTC Calculation: 441 R Axis:   -45 Text Interpretation: Sinus rhythm with 1st degree A-V block Right bundle branch block Left anterior fascicular block No significant change since last tracing When compared with ECG of 01-Dec-2021 09:40, PREVIOUS ECG IS PRESENT Confirmed by Akeria Hedstrom ([id number]) on 01/06/2023 7:38:28 PM         Imaging Studies ordered: I ordered imaging studies including CT head I independently visualized and interpreted imaging. I agree with the radiologist interpretation   Medicines ordered and prescription drug management: Meds ordered this encounter   Medications   sodium chloride flush (NS) 0.9 % injection 3 mL    -I have reviewed the patients home medicines and have made adjustments as needed  Critical interventions none  Consultations Obtained: I requested consultation with the neurologist on-call Dr. Selina Cooley,  and discussed lab and imaging findings as well as pertinent plan - they recommend: No additional imaging outside CT   Cardiac Monitoring: The patient was maintained on a cardiac monitor.  I personally viewed and interpreted the cardiac monitored which showed an underlying rhythm of: NSR  Social Determinants of Health:  Factors impacting patients care include: none   Reevaluation: After the interventions noted above, I reevaluated the patient and found that they have :improved  Co morbidities that complicate the patient evaluation  Past Medical History:  Diagnosis Date   Arthritis    L end stage knee DKD, mild R knee DJD, R hip end stage DJD   DDD (degenerative disc disease), cervical    multilevel   Gout    rare flares   HLD (hyperlipidemia)    Low HDL   Hyperglycemia    Hypertension    Lumbar spinal stenosis 11/15/2014   severe central canal stenosis L3/4;L4/5 bilat facet hypertrophy, annular disc bulging, grade 1 sphondylolisthesis (Dumonski) planned PT and ESI (Wang)   Lumbar spinal stenosis    s/p lumbar decompressive surgery 11/2021 (Dumonski)   OSA on CPAP    Osteoarthritis    R knee and hip, s/p L TKR   Pre-diabetes       Dispostion: I considered admission for this patient, but at this time he does not meet inpatient criteria for admission and he is safe for discharge with outpatient follow-up     Final Clinical Impression(s) / ED Diagnoses Final diagnoses:  None     @PCDICTATION @    Glendora Score, MD 01/06/23 1940

## 2023-01-06 NOTE — ED Provider Notes (Signed)
Here with dizziness, head "feeling swimmy" that started today. He also had an episode of blurry vision. The symptoms resolved on their own. He may feel a little off when he stands up or bends down. No weakness. Wife is bedside and denies AMS.  Had knee replacement last month No chest pain or shortness of breath.  Hx HTN, takes lisinopril. BP today is mildly hypotensive 98/79 History of aorta dilation   With age, history, symptoms, sent to ED for advanced workup which is unable to be performed in the urgent care setting. Patient D/C to ED with wife   Marlow Baars, [place] 01/06/23 1101

## 2023-01-07 DIAGNOSIS — M6281 Muscle weakness (generalized): Secondary | ICD-10-CM | POA: Diagnosis not present

## 2023-01-07 DIAGNOSIS — M25662 Stiffness of left knee, not elsewhere classified: Secondary | ICD-10-CM | POA: Diagnosis not present

## 2023-01-07 DIAGNOSIS — Z96652 Presence of left artificial knee joint: Secondary | ICD-10-CM | POA: Diagnosis not present

## 2023-01-12 ENCOUNTER — Encounter: Payer: Self-pay | Admitting: Family Medicine

## 2023-01-12 ENCOUNTER — Ambulatory Visit (INDEPENDENT_AMBULATORY_CARE_PROVIDER_SITE_OTHER): Payer: Medicare Other | Admitting: Family Medicine

## 2023-01-12 VITALS — BP 126/64 | Temp 97.4°F | Ht 71.0 in | Wt 221.1 lb

## 2023-01-12 DIAGNOSIS — I358 Other nonrheumatic aortic valve disorders: Secondary | ICD-10-CM

## 2023-01-12 DIAGNOSIS — Z96652 Presence of left artificial knee joint: Secondary | ICD-10-CM

## 2023-01-12 DIAGNOSIS — D649 Anemia, unspecified: Secondary | ICD-10-CM | POA: Diagnosis not present

## 2023-01-12 DIAGNOSIS — I452 Bifascicular block: Secondary | ICD-10-CM | POA: Diagnosis not present

## 2023-01-12 DIAGNOSIS — I10 Essential (primary) hypertension: Secondary | ICD-10-CM

## 2023-01-12 DIAGNOSIS — R42 Dizziness and giddiness: Secondary | ICD-10-CM | POA: Diagnosis not present

## 2023-01-12 DIAGNOSIS — G4733 Obstructive sleep apnea (adult) (pediatric): Secondary | ICD-10-CM | POA: Diagnosis not present

## 2023-01-12 DIAGNOSIS — M25662 Stiffness of left knee, not elsewhere classified: Secondary | ICD-10-CM | POA: Diagnosis not present

## 2023-01-12 NOTE — Patient Instructions (Addendum)
Anemia may have been related to blood loss from knee surgery. This could have led to dizziness + not using CPAP contributed.  Increase iron in diet - meats/chicken/fish, nuts/beans, iron fortified bread and cereal, and leafy greens. Start oral iron pill (ferrous sulfate 325mg /65FE) every other day over the counter. This can cause constipation and dark stools.  Keep an eye on blood pressures. Let me know if consistently <110/60.  Schedule lab visit in 2-3 weeks for repeat blood counts.

## 2023-01-12 NOTE — Progress Notes (Signed)
Ph: [phone] Fax: [phone]   Patient ID: Ronald Mead., male    DOB: August 03, 1951, 72 y.o.   MRN: [id number]  This visit was conducted in person.  BP 126/64   Temp (!) 97.4 F (36.3 C) (Temporal)   Ht 5\' 11"  (1.803 m)   Wt 221 lb 2 oz (100.3 kg)   BMI 30.84 kg/m   Orthostatic VS for the past 72 hrs (Last 3 readings):  Orthostatic BP Patient Position  01/12/23 1604 122/62 Standing  01/12/23 1601 140/68 Supine   CC: ER f/u visit  Subjective:   HPI: Ronald Devich. is a 72 y.o. male presenting on 01/12/2023 for Hospitalization Follow-up (Seen on 01/06/23 at [facility] ED, dx dizziness; near syncope. Thinks sxs due to tizanidine. )   Recent ER evaluation for HA, dizziness described as woozy feeling and fuzzy vision. No vertigo or presyncope. Presented to [facility] for evaluation - BP down to 98/79, concerned for TIA/CVA so sent to ER. Reassuring evaluation including head CT, neg hemoccult. Hgb did drop down to 10.5 (normally 13). Denies blood in stool or urine. He stopped aspirin 81mg  due to easy bleeding/bruising.   This was in setting of recent tizanidine use, recent L total knee revision 11/2022 (likely cause of anemia) and recently being less adherent to nightly CPAP use. Likely all contributing.  He continues outpatient PT  OSA on CPAP followed by ENT Dr Pollyann Kennedy. He's restarted CPAP use, notes he's been sleeping better.   He does have cardiology follow up in July.      Relevant past medical, surgical, family and social history reviewed and updated as indicated. Interim medical history since our last visit reviewed. Allergies and medications reviewed and updated. Outpatient Medications Prior to Visit  Medication Sig Dispense Refill   allopurinol (ZYLOPRIM) 300 MG tablet Take 1 tablet (300 mg total) by mouth daily. 90 tablet 4   gabapentin (NEURONTIN) 300 MG capsule Take 1 capsule (300 mg total) by mouth at bedtime as needed (Nerve pan). 90 capsule 4   lisinopril  (ZESTRIL) 5 MG tablet Take 1 tablet (5 mg total) by mouth daily. 90 tablet 4   Multiple Vitamins-Minerals (CENTRUM SILVER 50+MEN PO) Take 1 tablet by mouth daily.     NON FORMULARY Pt uses a cpap nightly     Omega 3-6-9 Fatty Acids (OMEGA 3-6-9 PO) Take 1,000 mg by mouth in the morning and at bedtime.     Respiratory Therapy Supplies (CARETOUCH CPAP & BIPAP HOSE) MISC by Does not apply route.     aspirin 81 MG chewable tablet Chew 1 tablet (81 mg total) by mouth 2 (two) times daily. For 2 weeks then once a day for 2 weeks for blood clot prevention. 45 tablet 0   HYDROcodone-acetaminophen (NORCO/VICODIN) 5-325 MG tablet Take 1-2 tablets by mouth every 6 (six) hours as needed for severe pain or moderate pain (pain scpost op pain). 30 tablet 0   oxymetazoline (AFRIN) 0.05 % nasal spray Place 1 spray into both nostrils daily as needed for congestion.     tiZANidine (ZANAFLEX) 4 MG tablet Take 1 tablet (4 mg total) by mouth every 6 (six) hours as needed for muscle spasms. 30 tablet 1   No facility-administered medications prior to visit.     Per HPI unless specifically indicated in ROS section below Review of Systems  Objective:  BP 126/64   Temp (!) 97.4 F (36.3 C) (Temporal)   Ht 5\' 11"  (1.803 m)   Wt  221 lb 2 oz (100.3 kg)   BMI 30.84 kg/m   Wt Readings from Last 3 Encounters:  01/12/23 221 lb 2 oz (100.3 kg)  11/30/22 223 lb 3.2 oz (101.2 kg)  11/18/22 223 lb 3.2 oz (101.2 kg)      Physical Exam Vitals and nursing note reviewed.  Constitutional:      Appearance: Normal appearance. He is not ill-appearing.  HENT:     Mouth/Throat:     Mouth: Mucous membranes are moist.     Pharynx: Oropharynx is clear. No oropharyngeal exudate or posterior oropharyngeal erythema.  Cardiovascular:     Rate and Rhythm: Normal rate and regular rhythm.     Pulses: Normal pulses.     Heart sounds: Murmur (3/6 systolic USB) heard.  Pulmonary:     Effort: Pulmonary effort is normal. No respiratory  distress.     Breath sounds: Normal breath sounds. No wheezing, rhonchi or rales.  Musculoskeletal:     Right lower leg: No edema.     Left lower leg: Edema (predominantly at knee after recent surgery) present.  Skin:    General: Skin is warm and dry.     Findings: No rash.     Comments: Longitudinal L knee incision well approximated   Neurological:     Mental Status: He is alert.       Results for orders placed or performed during the hospital encounter of 01/06/23  Protime-INR  Result Value Ref Range   Prothrombin Time 13.7 11.4 - 15.2 seconds   INR 1.0 0.8 - 1.2  APTT  Result Value Ref Range   aPTT 28 24 - 36 seconds  CBC  Result Value Ref Range   WBC 6.4 4.0 - 10.5 K/uL   RBC 3.61 (L) 4.22 - 5.81 MIL/uL   Hemoglobin 10.8 (L) 13.0 - 17.0 g/dL   HCT 13.0 (L) 86.5 - 78.4 %   MCV 92.0 80.0 - 100.0 fL   MCH 29.9 26.0 - 34.0 pg   MCHC 32.5 30.0 - 36.0 g/dL   RDW 69.6 29.5 - 28.4 %   Platelets 207 150 - 400 K/uL   nRBC 0.0 0.0 - 0.2 %  Differential  Result Value Ref Range   Neutrophils Relative % 59 %   Neutro Abs 3.9 1.7 - 7.7 K/uL   Lymphocytes Relative 26 %   Lymphs Abs 1.7 0.7 - 4.0 K/uL   Monocytes Relative 10 %   Monocytes Absolute 0.6 0.1 - 1.0 K/uL   Eosinophils Relative 3 %   Eosinophils Absolute 0.2 0.0 - 0.5 K/uL   Basophils Relative 1 %   Basophils Absolute 0.0 0.0 - 0.1 K/uL   Immature Granulocytes 1 %   Abs Immature Granulocytes 0.03 0.00 - 0.07 K/uL  Comprehensive metabolic panel  Result Value Ref Range   Sodium 135 135 - 145 mmol/L   Potassium 4.2 3.5 - 5.1 mmol/L   Chloride 103 98 - 111 mmol/L   CO2 23 22 - 32 mmol/L   Glucose, Bld 157 (H) 70 - 99 mg/dL   BUN 22 8 - 23 mg/dL   Creatinine, Ser 1.32 0.61 - 1.24 mg/dL   Calcium 8.7 (L) 8.9 - 10.3 mg/dL   Total Protein 6.5 6.5 - 8.1 g/dL   Albumin 3.5 3.5 - 5.0 g/dL   AST 20 15 - 41 U/L   ALT 18 0 - 44 U/L   Alkaline Phosphatase 79 38 - 126 U/L   Total Bilirubin 0.8 0.3 - 1.2  mg/dL   GFR,  Estimated >91 >47 mL/min   Anion gap 9 5 - 15  Ethanol  Result Value Ref Range   Alcohol, Ethyl (B) <10 <10 mg/dL  TSH  Result Value Ref Range   TSH 2.387 0.350 - 4.500 uIU/mL  I-stat chem 8, ED  Result Value Ref Range   Sodium 137 135 - 145 mmol/L   Potassium 4.6 3.5 - 5.1 mmol/L   Chloride 103 98 - 111 mmol/L   BUN 25 (H) 8 - 23 mg/dL   Creatinine, Ser 8.29 0.61 - 1.24 mg/dL   Glucose, Bld 562 (H) 70 - 99 mg/dL   Calcium, Ion 1.30 (L) 1.15 - 1.40 mmol/L   TCO2 23 22 - 32 mmol/L   Hemoglobin 10.5 (L) 13.0 - 17.0 g/dL   HCT 86.5 (L) 78.4 - 69.6 %  CBG monitoring, ED  Result Value Ref Range   Glucose-Capillary 166 (H) 70 - 99 mg/dL  POC occult blood, ED  Result Value Ref Range   Fecal Occult Bld NEGATIVE NEGATIVE   Lab Results  Component Value Date   CHOL 152 09/15/2022   HDL 26.70 (L) 09/15/2022   LDLCALC 89 03/16/2022   LDLDIRECT 96.0 09/15/2022   TRIG 202.0 (H) 09/15/2022   CHOLHDL 6 09/15/2022   Echocardiogram 11/2021 - EF 55-60%, no wall motion abnormalities, mild LVH, aortic sclerosis without stenosis, mildly dilated ascending aorta measuring 38 mm.   Assessment & Plan:   Problem List Items Addressed This Visit     Essential hypertension (Chronic)    BP stable, actually on the low side on low dose lisinopril after presumed acute blood lost anemia post-op knee replacement. BP drops with standing. Encouraged good hydration status. He will let me know if home BP runs <110/60 to consider stopping lisinopril.       Bifascicular block (Chronic)    Keep upcoming cardiology f/u over summer 2024.       OSA (obstructive sleep apnea)    Notes sleeping better since restarting CPAP - continue.       Dizziness - Primary    Recent episode of headache, dizziness likely multifactorial in nature - recent post-op anemia, possible dehydration, tizanidine muscle relaxant use, and missing CPAP use.  Recent iFOB negative for occult blood.  Supportive measures recommended.  He  will start oral iron replacement for the next month.  He will restart nightly CPAP use. He will stop tizanidine use.  Will check labwork in 2-3 wks.  Update sooner if recurrent symptoms.       Anemia    Likely post-op acute blood loss anemia.  Will return in a few weeks to repeat labwork.  Rec iron rich diet, he will also start oral iron QOD x 1 month. Reviewed monitoring for GI upset, constipation, dark stools on oral iron.       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       Other Visit Diagnoses     Hypocalcemia       Relevant Orders   Calcium, ionized        No orders of the defined types were placed in this encounter.   Orders Placed This Encounter  Procedures   Calcium, ionized    Standing Status:   Future    Standing  Expiration Date:   01/12/2024   CBC with Differential/Platelet    Standing Status:   Future    Standing Expiration Date:   01/12/2024   Ferritin    Standing Status:   Future    Standing Expiration Date:   01/12/2024   IBC panel    Standing Status:   Future    Standing Expiration Date:   01/12/2024    Patient Instructions  Anemia may have been related to blood loss from knee surgery. This could have led to dizziness + not using CPAP contributed.  Increase iron in diet - meats/chicken/fish, nuts/beans, iron fortified bread and cereal, and leafy greens. Start oral iron pill (ferrous sulfate 325mg /65FE) every other day over the counter. This can cause constipation and dark stools.  Keep an eye on blood pressures. Let me know if consistently <110/60.  Schedule lab visit in 2-3 weeks for repeat blood counts.   Follow up plan: No follow-ups on file.  [name], MD

## 2023-01-13 DIAGNOSIS — Z96652 Presence of left artificial knee joint: Secondary | ICD-10-CM | POA: Diagnosis not present

## 2023-01-13 DIAGNOSIS — M6281 Muscle weakness (generalized): Secondary | ICD-10-CM | POA: Diagnosis not present

## 2023-01-13 DIAGNOSIS — M25662 Stiffness of left knee, not elsewhere classified: Secondary | ICD-10-CM | POA: Diagnosis not present

## 2023-01-14 ENCOUNTER — Encounter: Payer: Self-pay | Admitting: Family Medicine

## 2023-01-14 DIAGNOSIS — I358 Other nonrheumatic aortic valve disorders: Secondary | ICD-10-CM | POA: Insufficient documentation

## 2023-01-14 NOTE — Assessment & Plan Note (Signed)
Recent episode of headache, dizziness likely multifactorial in nature - recent post-op anemia, possible dehydration, tizanidine muscle relaxant use, and missing CPAP use.  Recent iFOB negative for occult blood.  Supportive measures recommended.  He will start oral iron replacement for the next month.  He will restart nightly CPAP use. He will stop tizanidine use.  Will check labwork in 2-3 wks.  Update sooner if recurrent symptoms.

## 2023-01-14 NOTE — Assessment & Plan Note (Signed)
H/o aortic sclerosis without AS on latest echo.

## 2023-01-14 NOTE — Assessment & Plan Note (Signed)
Keep upcoming cardiology f/u over summer 2024.

## 2023-01-14 NOTE — Assessment & Plan Note (Signed)
Likely post-op acute blood loss anemia.  Will return in a few weeks to repeat labwork.  Rec iron rich diet, he will also start oral iron QOD x 1 month. Reviewed monitoring for GI upset, constipation, dark stools on oral iron.

## 2023-01-14 NOTE — Assessment & Plan Note (Signed)
Notes sleeping better since restarting CPAP - continue.

## 2023-01-14 NOTE — Assessment & Plan Note (Addendum)
BP stable, actually on the low side on low dose lisinopril after presumed acute blood lost anemia post-op knee replacement. BP drops with standing. Encouraged good hydration status. He will let me know if home BP runs <110/60 to consider stopping lisinopril.

## 2023-01-18 DIAGNOSIS — Z96652 Presence of left artificial knee joint: Secondary | ICD-10-CM | POA: Diagnosis not present

## 2023-01-18 DIAGNOSIS — M6281 Muscle weakness (generalized): Secondary | ICD-10-CM | POA: Diagnosis not present

## 2023-01-18 DIAGNOSIS — M25662 Stiffness of left knee, not elsewhere classified: Secondary | ICD-10-CM | POA: Diagnosis not present

## 2023-01-21 DIAGNOSIS — M25662 Stiffness of left knee, not elsewhere classified: Secondary | ICD-10-CM | POA: Diagnosis not present

## 2023-01-21 DIAGNOSIS — M6281 Muscle weakness (generalized): Secondary | ICD-10-CM | POA: Diagnosis not present

## 2023-01-21 DIAGNOSIS — Z96652 Presence of left artificial knee joint: Secondary | ICD-10-CM | POA: Diagnosis not present

## 2023-01-26 DIAGNOSIS — Z96652 Presence of left artificial knee joint: Secondary | ICD-10-CM | POA: Diagnosis not present

## 2023-01-26 DIAGNOSIS — M6281 Muscle weakness (generalized): Secondary | ICD-10-CM | POA: Diagnosis not present

## 2023-01-26 DIAGNOSIS — M25662 Stiffness of left knee, not elsewhere classified: Secondary | ICD-10-CM | POA: Diagnosis not present

## 2023-01-28 ENCOUNTER — Other Ambulatory Visit (INDEPENDENT_AMBULATORY_CARE_PROVIDER_SITE_OTHER): Payer: Medicare Other

## 2023-01-28 DIAGNOSIS — D649 Anemia, unspecified: Secondary | ICD-10-CM | POA: Diagnosis not present

## 2023-01-28 DIAGNOSIS — M6281 Muscle weakness (generalized): Secondary | ICD-10-CM | POA: Diagnosis not present

## 2023-01-28 DIAGNOSIS — M25662 Stiffness of left knee, not elsewhere classified: Secondary | ICD-10-CM | POA: Diagnosis not present

## 2023-01-28 DIAGNOSIS — Z96652 Presence of left artificial knee joint: Secondary | ICD-10-CM | POA: Diagnosis not present

## 2023-01-28 LAB — CBC WITH DIFFERENTIAL/PLATELET
Basophils Absolute: 0 10*3/uL (ref 0.0–0.1)
Basophils Relative: 0.5 % (ref 0.0–3.0)
Eosinophils Absolute: 0.2 10*3/uL (ref 0.0–0.7)
Eosinophils Relative: 3.3 % (ref 0.0–5.0)
HCT: 34.6 % — ABNORMAL LOW (ref 39.0–52.0)
Hemoglobin: 11.4 g/dL — ABNORMAL LOW (ref 13.0–17.0)
Lymphocytes Relative: 26.9 % (ref 12.0–46.0)
Lymphs Abs: 1.6 10*3/uL (ref 0.7–4.0)
MCHC: 32.9 g/dL (ref 30.0–36.0)
MCV: 90.2 fl (ref 78.0–100.0)
Monocytes Absolute: 0.7 10*3/uL (ref 0.1–1.0)
Monocytes Relative: 11.8 % (ref 3.0–12.0)
Neutro Abs: 3.4 10*3/uL (ref 1.4–7.7)
Neutrophils Relative %: 57.5 % (ref 43.0–77.0)
Platelets: 216 10*3/uL (ref 150.0–400.0)
RBC: 3.83 Mil/uL — ABNORMAL LOW (ref 4.22–5.81)
RDW: 15.7 % — ABNORMAL HIGH (ref 11.5–15.5)
WBC: 6 10*3/uL (ref 4.0–10.5)

## 2023-01-28 LAB — FERRITIN: Ferritin: 256.2 ng/mL (ref 22.0–322.0)

## 2023-01-28 LAB — IBC PANEL
Iron: 42 ug/dL (ref 42–165)
Saturation Ratios: 18.3 % — ABNORMAL LOW (ref 20.0–50.0)
TIBC: 229.6 ug/dL — ABNORMAL LOW (ref 250.0–450.0)
Transferrin: 164 mg/dL — ABNORMAL LOW (ref 212.0–360.0)

## 2023-01-30 LAB — CALCIUM, IONIZED: Calcium, Ion: 4.9 mg/dL (ref 4.7–5.5)

## 2023-02-01 DIAGNOSIS — M25662 Stiffness of left knee, not elsewhere classified: Secondary | ICD-10-CM | POA: Diagnosis not present

## 2023-02-01 DIAGNOSIS — M6281 Muscle weakness (generalized): Secondary | ICD-10-CM | POA: Diagnosis not present

## 2023-02-01 DIAGNOSIS — Z96652 Presence of left artificial knee joint: Secondary | ICD-10-CM | POA: Diagnosis not present

## 2023-02-04 DIAGNOSIS — M6281 Muscle weakness (generalized): Secondary | ICD-10-CM | POA: Diagnosis not present

## 2023-02-04 DIAGNOSIS — Z96652 Presence of left artificial knee joint: Secondary | ICD-10-CM | POA: Diagnosis not present

## 2023-02-04 DIAGNOSIS — M25662 Stiffness of left knee, not elsewhere classified: Secondary | ICD-10-CM | POA: Diagnosis not present

## 2023-02-21 DIAGNOSIS — M25662 Stiffness of left knee, not elsewhere classified: Secondary | ICD-10-CM | POA: Diagnosis not present

## 2023-03-14 NOTE — Progress Notes (Unsigned)
Cardiology Clinic Note   Patient Name: Ronald Bullock. Date of Encounter: 03/15/2023  Primary Care Provider:  [name], MD Primary Cardiologist:  Bryan Lemma, MD  Patient Profile    Ronald Bullock. 72 year old male presents the clinic today for follow-up evaluation of his ascending aortic dilation and aortic valve sclerosis.  Past Medical History    Past Medical History:  Diagnosis Date   Arthritis    L end stage knee DKD, mild R knee DJD, R hip end stage DJD   DDD (degenerative disc disease), cervical    multilevel   Gout    rare flares   HLD (hyperlipidemia)    Low HDL   Hyperglycemia    Hypertension    Lumbar spinal stenosis 11/15/2014   severe central canal stenosis L3/4;L4/5 bilat facet hypertrophy, annular disc bulging, grade 1 sphondylolisthesis (Dumonski) planned PT and ESI Regino Schultze)   Lumbar spinal stenosis    s/p lumbar decompressive surgery 11/2021 ([facility]umonski)   OSA on CPAP    Osteoarthritis    R knee and hip, s/p L TKR   Pre-diabetes    Past Surgical History:  Procedure Laterality Date   cardiolyte  1997   WNL   carotid US  1997   WNL   CATARACT EXTRACTION W/PHACO Left 12/24/2014   Procedure: CATARACT EXTRACTION PHACO AND INTRAOCULAR LENS PLACEMENT (IOC);  Surgeon: Galen Manila, MD; [place] 01:16AP% 26.4Cde 20.17   COLONOSCOPY  1998   WNL   COLONOSCOPY  06/2010   WNL, internal hemorrhoids, rec rpt 10 yrs   COLONOSCOPY  07/2021   TAx3, HPx1, int hem, rpt 5 yrs Marina Goodell)   CT HEAD LIMITED W/CM  1997   WNL   ESOPHAGOGASTRODUODENOSCOPY  1998   gastric polyps, benign   JOINT REPLACEMENT  1968   Left Knee, cartilage removed   KNEE ARTHROSCOPY  1968   cartilage removal   LUMBAR EPIDURAL INJECTION  02/2017   L3-4 interlaminar injection Regino Schultze)   MRI lumbar  2000   mild bulge L3/4, mild foraminal narrowing L4/5, L5/S2, small disk herniation   MRI lumbar  11/2014   severe L3/4, L4/5 central canal stenosis (Dumonski)   SHOULDER  SURGERY Right 05/2019   [facility]   TOTAL HIP ARTHROPLASTY Right 03/27/2013   TOTAL HIP ARTHROPLASTY ANTERIOR APPROACH;  Surgeon: Velna Ochs, MD   TOTAL KNEE ARTHROPLASTY Left 09/26/2012   TOTAL KNEE ARTHROPLASTY;  Surgeon: Velna Ochs, MD   TOTAL KNEE REVISION Left 11/30/2022   Procedure: LEFT TOTAL KNEE REVISION;  Surgeon: Marcene Corning, MD;  Location: [facility];  Service: Orthopedics;  Laterality: Left;   TRANSFORAMINAL LUMBAR INTERBODY FUSION (TLIF) WITH PEDICLE SCREW FIXATION 2 LEVEL Left 12/09/2021   Procedure: LEFT-SIDED LUMBAR 3- LUMBAR 4, LUMBAR 4- LUMBAR 5 TRANSFORAMINAL INTERBODY FUSION AND DECOMPRESSION WITH INSTRUMENTATION AND ALLOGRAFT;  Surgeon: Estill Bamberg, MD;  Location: [facility];  Service: Orthopedics;  Laterality: Left;   TRANSTHORACIC ECHOCARDIOGRAM  12/08/2021   Normal LV size and function.  EF 55-60 %.  No RWMA.  Mild asymmetric basal septal LVH.  Normal diastolic function.  Mild RV dilation with normal function.  Normal MV, mild AOV calcification-sclerosis but no stenosis.    Allergies  Allergies  Allergen Reactions   Ibuprofen     Weight gain   Meloxicam Other (See Comments)    Peripheral edema, weight gain   Omeprazole Diarrhea and Nausea And Vomiting    Vomiting/diarrhea on BID dosing    History of Present Illness  Ronald Bullock. has a PMH of HTN, bifascicular block, ascending aortic dilation, aortic sclerosis, OSA, burning mouth syndrome, ulnar neuropathy, left total knee replacement, hyperlipidemia, gout, obesity, prediabetes, dizziness, and anemia.  He underwent back surgery 4/23.  During that time his echocardiogram 12/08/2021 showed normal LV function and no regional wall motion abnormalities.  He was noted to have mild asymmetric basilar septal LVH and normal diastolic function.  Mild RV dilation with normal function was also noted.  He was seen in follow-up by Dr. Herbie Baltimore on 03/12/2022.  He continued to do well from a cardiac standpoint  at that time.  He was completing physical therapy.  He denied chest pain or shortness of breath.  His blood pressure was borderline.  His lisinopril was continued.  It was felt that his knee pain was contributing to his elevated blood pressure.  He was contacted via virtual visit on 09/29/2022 for preoperative cardiac evaluation.  At that time he denied chest pain and shortness of breath.  He was able to complete 4 METS of physical activity.  He was planning to undergo left knee arthroplasty.  He presents to the clinic today for follow-up evaluation and states he continues to be active at home.  He recently had a reaction to muscle relaxer medication and was evaluated in the emergency department.  He was found to be anemic as well.  He was seen in follow-up by his PCP who prescribed a course of iron therapy.  He tolerated p.o. iron well.  His hemoglobin recovered.  His blood pressure initially today is 150/68 and on recheck is 128/58.  He does have some mild bilateral ankle edema.  He tolerated left knee revision of his previous/prior prosthesis.  I will continue his current medication, give heart healthy diet information, have him continue his current physical activity and plan follow-up in 12 months.   Today he denies chest pain, shortness of breath,  fatigue, palpitations, melena, hematuria, hemoptysis, diaphoresis, weakness, presyncope, syncope, orthopnea, and PND.    Home Medications    Prior to Admission medications   Medication Sig Start Date End Date Taking? Authorizing Provider  allopurinol (ZYLOPRIM) 300 MG tablet Take 1 tablet (300 mg total) by mouth daily. 09/21/22   [name], MD  gabapentin (NEURONTIN) 300 MG capsule Take 1 capsule (300 mg total) by mouth at bedtime as needed (Nerve pan). 09/23/22   [name], MD  lisinopril (ZESTRIL) 5 MG tablet Take 1 tablet (5 mg total) by mouth daily. 09/21/22   [name], MD  Multiple Vitamins-Minerals (CENTRUM SILVER 50+MEN PO)  Take 1 tablet by mouth daily.    [provider]  NON FORMULARY Pt uses a cpap nightly    [provider]  Omega 3-6-9 Fatty Acids (OMEGA 3-6-9 PO) Take 1,000 mg by mouth in the morning and at bedtime.    [provider]  Respiratory Therapy Supplies (CARETOUCH CPAP & BIPAP HOSE) MISC by Does not apply route.    [provider]    Family History    Family History  Problem Relation Age of Onset   Asthma Mother    Cancer Mother        Vaginal; radiation dz of bowel   Stroke Father 47   Diabetes Father    Obesity Sister    Esophageal cancer Neg Hx    Stomach cancer Neg Hx    Rectal cancer Neg Hx    Colon cancer Neg Hx    Heart attack Neg Hx  CAD Neg Hx    He indicated that his mother is deceased. He indicated that his father is deceased. He indicated that only one of his two sisters is alive. He indicated that the status of his neg hx is unknown.  Social History    Social History   Socioeconomic History   Marital status: Married    Spouse name: Zaragoza Fouse   Number of children: 4   Years of education: college   Highest education level: Not on file  Occupational History   Occupation: Psychologist, sport and exercise Chem in [employer]  Tobacco Use   Smoking status: Never   Smokeless tobacco: Never  Vaping Use   Vaping status: Never Used  Substance and Sexual Activity   Alcohol use: Yes    Comment: Rare   Drug use: No   Sexual activity: Never  Other Topics Concern   Not on file  Social History Narrative   Lives with wife, has grown son   Activity: no regular exercise-presently limited by left knee pain for 1 prior surgery as well as significant lumbar spinal pain/stenosis with significant radiculopathy down bilateral legs.  Prior to this was ambulatory without any issues.   Diet: good water, some fish, good vegetables, red meat 3x/wk   Right-handed.   Two 32-ounce glasses of tea.   Social Determinants of Health    Financial Resource Strain: Low Risk  (09/15/2022)   Overall Financial Resource Strain (CARDIA)    Difficulty of Paying Living Expenses: Not very hard  Food Insecurity: No Food Insecurity (12/02/2022)   Hunger Vital Sign    Worried About Running Out of Food in the Last Year: Never true    Ran Out of Food in the Last Year: Never true  Transportation Needs: No Transportation Needs (12/02/2022)   PRAPARE - Administrator, Civil Service (Medical): No    Lack of Transportation (Non-Medical): No  Physical Activity: Sufficiently Active (09/15/2022)   Exercise Vital Sign    Days of Exercise per Week: 3 days    Minutes of Exercise per Session: 60 min  Stress: No Stress Concern Present (09/15/2022)   [employer] of Occupational Health - Occupational Stress Questionnaire    Feeling of Stress : Only a little  Social Connections: Moderately Isolated (09/15/2022)   Social Connection and Isolation Panel [NHANES]    Frequency of Communication with Friends and Family: More than three times a week    Frequency of Social Gatherings with Friends and Family: More than three times a week    Attends Religious Services: Never    Database administrator or Organizations: No    Attends Banker Meetings: Never    Marital Status: Married  Catering manager Violence: Not At Risk (11/30/2022)   Humiliation, Afraid, Rape, and Kick questionnaire    Fear of Current or Ex-Partner: No    Emotionally Abused: No    Physically Abused: No    Sexually Abused: No     Review of Systems    General:  No chills, fever, night sweats or weight changes.  Cardiovascular:  No chest pain, dyspnea on exertion, edema, orthopnea, palpitations, paroxysmal nocturnal dyspnea. Dermatological: No rash, lesions/masses Respiratory: No cough, dyspnea Urologic: No hematuria, dysuria Abdominal:   No nausea, vomiting, diarrhea, bright red blood per rectum, melena, or hematemesis Neurologic:  No visual changes,  wkns, changes in mental status. All other systems reviewed and are otherwise negative except as noted above.  Physical Exam  VS:  BP (!) 128/58   Pulse 80   Ht 5\' 11"  (1.803 m)   Wt 222 lb 9.6 oz (101 kg)   SpO2 98%   BMI 31.05 kg/m  , BMI Body mass index is 31.05 kg/m. GEN: Well nourished, well developed, in no acute distress. HEENT: normal. Neck: Supple, no JVD, carotid bruits, or masses. Cardiac: RRR, no murmurs, rubs, or gallops. No clubbing, cyanosis, bilateral ankle edema.  Radials/DP/PT 2+ and equal bilaterally.  Respiratory:  Respirations regular and unlabored, clear to auscultation bilaterally. GI: Soft, nontender, nondistended, BS + x 4. MS: no deformity or atrophy. Skin: warm and dry, no rash. Neuro:  Strength and sensation are intact. Psych: Normal affect.  Accessory Clinical Findings    Recent Labs: 01/06/2023: ALT 18; BUN 25; Creatinine, Ser 1.10; Potassium 4.6; Sodium 137; TSH 2.387 01/28/2023: Hemoglobin 11.4; Platelets 216.0   Recent Lipid Panel    Component Value Date/Time   CHOL 152 09/15/2022 0803   TRIG 202.0 (H) 09/15/2022 0803   HDL 26.70 (L) 09/15/2022 0803   CHOLHDL 6 09/15/2022 0803   VLDL 40.4 (H) 09/15/2022 0803   LDLCALC 89 03/16/2022 0856   LDLDIRECT 96.0 09/15/2022 0803         ECG personally reviewed by me today-none today.   Echocardiogram 12/08/2021  IMPRESSIONS     1. Left ventricular ejection fraction, by estimation, is 55 to 60%. Left  ventricular ejection fraction by 3D volume is 56 %. The left ventricle has  normal function. The left ventricle has no regional wall motion  abnormalities. There is mild asymmetric  left ventricular hypertrophy of the basal-septal segment. Left ventricular  diastolic parameters were normal. The average left ventricular global  longitudinal strain is -21.5 %.   2. Right ventricular systolic function is normal. The right ventricular  size is mildly enlarged.   3. The mitral valve is normal  in structure. Trivial mitral valve  regurgitation.   4. The aortic valve is tricuspid. Aortic valve regurgitation is not  visualized. Aortic valve sclerosis/calcification is present, without any  evidence of aortic stenosis.   5. Aortic dilatation noted. There is mild dilatation of the ascending  aorta, measuring 38 mm.   FINDINGS   Left Ventricle: Left ventricular ejection fraction, by estimation, is 55  to 60%. Left ventricular ejection fraction by 3D volume is 56 %. The left  ventricle has normal function. The left ventricle has no regional wall  motion abnormalities. The average  left ventricular global longitudinal strain is -21.5 %. The left  ventricular internal cavity size was normal in size. There is mild  asymmetric left ventricular hypertrophy of the basal-septal segment. Left  ventricular diastolic parameters were normal.   Right Ventricle: The right ventricular size is mildly enlarged. No  increase in right ventricular wall thickness. Right ventricular systolic  function is normal.   Left Atrium: Left atrial size was normal in size.   Right Atrium: Right atrial size was normal in size.   Pericardium: There is no evidence of pericardial effusion.   Mitral Valve: The mitral valve is normal in structure. Trivial mitral  valve regurgitation.   Tricuspid Valve: The tricuspid valve is normal in structure. Tricuspid  valve regurgitation is trivial.   Aortic Valve: The aortic valve is tricuspid. Aortic valve regurgitation is  not visualized. Aortic valve sclerosis/calcification is present, without  any evidence of aortic stenosis. Aortic valve mean gradient measures 7.0  mmHg. Aortic valve peak gradient  measures 13.1 mmHg. Aortic valve  area, by VTI measures 2.66 cm.   Pulmonic Valve: The pulmonic valve was not well visualized. Pulmonic valve  regurgitation is mild.   Aorta: The aortic root is normal in size and structure and aortic  dilatation noted. There is mild  dilatation of the ascending aorta,  measuring 38 mm.   IAS/Shunts: The interatrial septum was not well visualized.       Assessment & Plan   1.  Bifascicular block-denies lightheadedness, presyncope or syncope.   Not a candidate for AV nodal blocking agents, calcium channel blockers, digoxin.  Previously did note improvement with floating type sensation after having Epley maneuvers. Continue to monitor  Ascending aortic dilation-previously noted to be 38 mm.  Noted on echocardiogram 12/08/2021. Maintain good blood pressure control Repeat echocardiogram  Essential hypertension-BP today 128/58. Maintain blood pressure log Heart healthy low-sodium diet Continue lisinopril  Hyperlipidemia-LDL 122 on 01/28/23.  Goal less than 100 LDL. High-fiber diet Increase physical activity as tolerated Follows with PCP  Disposition: Follow-up with Dr. Herbie Baltimore or me in 12 months.   Thomasene Ripple. Kassim Guertin NP-C     03/15/2023, 3:20 PM [facility] [address] Office [phone] Fax [phone]    I spent 14 minutes examining this patient, reviewing medications, and using patient centered shared decision making involving her cardiac care.  Prior to her visit I spent greater than 20 minutes reviewing her past medical history,  medications, and prior cardiac tests.

## 2023-03-15 ENCOUNTER — Encounter: Payer: Self-pay | Admitting: General Practice

## 2023-03-15 ENCOUNTER — Ambulatory Visit: Payer: Medicare Other | Attending: General Practice | Admitting: General Practice

## 2023-03-15 VITALS — BP 128/58 | HR 80 | Ht 71.0 in | Wt 222.6 lb

## 2023-03-15 DIAGNOSIS — I7121 Aneurysm of the ascending aorta, without rupture: Secondary | ICD-10-CM

## 2023-03-15 DIAGNOSIS — I77810 Thoracic aortic ectasia: Secondary | ICD-10-CM

## 2023-03-15 DIAGNOSIS — I10 Essential (primary) hypertension: Secondary | ICD-10-CM | POA: Diagnosis not present

## 2023-03-15 DIAGNOSIS — I452 Bifascicular block: Secondary | ICD-10-CM

## 2023-03-15 DIAGNOSIS — E785 Hyperlipidemia, unspecified: Secondary | ICD-10-CM | POA: Diagnosis not present

## 2023-03-15 NOTE — Patient Instructions (Signed)
Medication Instructions:  The current medical regimen is effective;  continue present plan and medications as directed. Please refer to the Current Medication list given to you today.  *If you need a refill on your cardiac medications before your next appointment, please call your pharmacy*  Lab Work: NONE If you have labs (blood work) drawn today and your tests are completely normal, you will receive your results only by:  MyChart Message (if you have MyChart) OR  A paper copy in the mail If you have any lab test that is abnormal or we need to change your treatment, we will call you to review the results.  Testing/Procedures: Echocardiogram (IN 1-2 MONTHS)- Your physician has requested that you have an echocardiogram. Echocardiography is a painless test that uses sound waves to create images of your heart. It provides your doctor with information about the size and shape of your heart and how well your heart's chambers and valves are working. This procedure takes approximately one hour. There are no restrictions for this procedure.    Follow-Up: At [facility], you and your health needs are our priority.  As part of our continuing mission to provide you with exceptional heart care, we have created designated Provider Care Teams.  These Care Teams include your primary Cardiologist (physician) and Advanced Practice Providers (APPs -  Physician Assistants and Nurse Practitioners) who all work together to provide you with the care you need, when you need it.  We recommend signing up for the patient portal called "MyChart".  Sign up information is provided on this After Visit Summary.  MyChart is used to connect with patients for Virtual Visits (Telemedicine).  Patients are able to view lab/test results, encounter notes, upcoming appointments, etc.  Non-urgent messages can be sent to your provider as well.   To learn more about what you can do with MyChart, go to [email or web address].     Your next appointment:   12 month(s)  Provider:   Bryan Lemma, MD  or Edd Fabian, FNP        Other Instructions MAINTAIN PHYSICAL ACTIVITY-AS TOLERATED  READ AND FOLLOW HEART HEALTHY DIET ATTACHED   Heart-Healthy Eating Plan Eating a healthy diet is important for the health of your heart. A heart-healthy eating plan includes: Eating less unhealthy fats. Eating more healthy fats. Eating less salt in your food. Salt is also called sodium. Making other changes in your diet. Talk with your doctor or a diet specialist (dietitian) to create an eating plan that is right for you. What is my plan? What are tips for following this plan? Cooking Avoid frying your food. Try to bake, boil, grill, or broil it instead. You can also reduce fat by: Removing the skin from poultry. Removing all visible fats from meats. Steaming vegetables in water or broth. Meal planning  At meals, divide your plate into four equal parts: Fill one-half of your plate with vegetables and green salads. Fill one-fourth of your plate with whole grains. Fill one-fourth of your plate with lean protein foods. Eat 2-4 cups of vegetables per day. One cup of vegetables is: 1 cup (91 g) broccoli or cauliflower florets. 2 medium carrots. 1 large bell pepper. 1 large sweet potato. 1 large tomato. 1 medium white potato. 2 cups (150 g) raw leafy greens. Eat 1-2 cups of fruit per day. One cup of fruit is: 1 small apple 1 large banana 1 cup (237 g) mixed fruit, 1 large orange,  cup (82 g) dried fruit, 1  cup (240 mL) 100% fruit juice. Eat more foods that have soluble fiber. These are apples, broccoli, carrots, beans, peas, and barley. Try to get 20-30 g of fiber per day. Eat 4-5 servings of nuts, legumes, and seeds per week: 1 serving of dried beans or legumes equals  cup (90 g) cooked. 1 serving of nuts is  oz (12 almonds, 24 pistachios, or 7 walnut halves). 1 serving of seeds equals  oz (8 g). General  information Eat more home-cooked food. Eat less restaurant, buffet, and fast food. Limit or avoid alcohol. Limit foods that are high in starch and sugar. Avoid fried foods. Lose weight if you are overweight. Keep track of how much salt (sodium) you eat. This is important if you have high blood pressure. Ask your doctor to tell you more about this. Try to add vegetarian meals each week. Fats Choose healthy fats. These include olive oil and canola oil, flaxseeds, walnuts, almonds, and seeds. Eat more omega-3 fats. These include salmon, mackerel, sardines, tuna, flaxseed oil, and ground flaxseeds. Try to eat fish at least 2 times each week. Check food labels. Avoid foods with trans fats or high amounts of saturated fat. Limit saturated fats. These are often found in animal products, such as meats, butter, and cream. These are also found in plant foods, such as palm oil, palm kernel oil, and coconut oil. Avoid foods with partially hydrogenated oils in them. These have trans fats. Examples are stick margarine, some tub margarines, cookies, crackers, and other baked goods. What foods should I eat? Fruits All fresh, canned (in natural juice), or frozen fruits. Vegetables Fresh or frozen vegetables (raw, steamed, roasted, or grilled). Green salads. Grains Most grains. Choose whole wheat and whole grains most of the time. Rice and pasta, including brown rice and pastas made with whole wheat. Meats and other proteins Lean, well-trimmed beef, veal, pork, and lamb. Chicken and [place] without skin. All fish and shellfish. Wild duck, rabbit, pheasant, and venison. Egg whites or low-cholesterol egg substitutes. Dried beans, peas, lentils, and tofu. Seeds and most nuts. Dairy Low-fat or nonfat cheeses, including ricotta and mozzarella. Skim or 1% milk that is liquid, powdered, or evaporated. Buttermilk that is made with low-fat milk. Nonfat or low-fat yogurt. Fats and oils Non-hydrogenated (trans-free)  margarines. Vegetable oils, including soybean, sesame, sunflower, olive, peanut, safflower, corn, canola, and cottonseed. Salad dressings or mayonnaise made with a vegetable oil. Beverages Mineral water. Coffee and tea. Diet carbonated beverages. Sweets and desserts Sherbet, gelatin, and fruit ice. Small amounts of dark chocolate. Limit all sweets and desserts. Seasonings and condiments All seasonings and condiments. The items listed above may not be a complete list of foods and drinks you can eat. Contact a dietitian for more options. What foods should I avoid? Fruits Canned fruit in heavy syrup. Fruit in cream or butter sauce. Fried fruit. Limit coconut. Vegetables Vegetables cooked in cheese, cream, or butter sauce. Fried vegetables. Grains Breads that are made with saturated or trans fats, oils, or whole milk. Croissants. Sweet rolls. Donuts. High-fat crackers, such as cheese crackers. Meats and other proteins Fatty meats, such as hot dogs, ribs, sausage, bacon, rib-eye roast or steak. High-fat deli meats, such as salami and bologna. Caviar. Domestic duck and goose. Organ meats, such as liver. Dairy Cream, sour cream, cream cheese, and creamed cottage cheese. Whole-milk cheeses. Whole or 2% milk that is liquid, evaporated, or condensed. Whole buttermilk. Cream sauce or high-fat cheese sauce. Yogurt that is made from whole milk. Fats  and oils Meat fat, or shortening. Cocoa butter, hydrogenated oils, palm oil, coconut oil, palm kernel oil. Solid fats and shortenings, including bacon fat, salt pork, lard, and butter. Nondairy cream substitutes. Salad dressings with cheese or sour cream. Beverages Regular sodas and juice drinks with added sugar. Sweets and desserts Frosting. Pudding. Cookies. Cakes. Pies. Milk chocolate or white chocolate. Buttered syrups. Full-fat ice cream or ice cream drinks. The items listed above may not be a complete list of foods and drinks to avoid. Contact a  dietitian for more information. Summary Heart-healthy meal planning includes eating less unhealthy fats, eating more healthy fats, and making other changes in your diet. Eat a balanced diet. This includes fruits and vegetables, low-fat or nonfat dairy, lean protein, nuts and legumes, whole grains, and heart-healthy oils and fats. This information is not intended to replace advice given to you by your health care provider. Make sure you discuss any questions you have with your health care provider. Document Revised: 09/07/2021 Document Reviewed: 09/07/2021 Elsevier Patient Education  2024 [employer].

## 2023-03-28 ENCOUNTER — Ambulatory Visit (INDEPENDENT_AMBULATORY_CARE_PROVIDER_SITE_OTHER): Payer: Medicare Other | Admitting: Internal Medicine

## 2023-03-28 ENCOUNTER — Encounter: Payer: Self-pay | Admitting: Internal Medicine

## 2023-03-28 ENCOUNTER — Telehealth: Payer: Self-pay

## 2023-03-28 VITALS — BP 118/60 | HR 74 | Temp 97.7°F | Ht 71.0 in | Wt 221.0 lb

## 2023-03-28 DIAGNOSIS — U071 COVID-19: Secondary | ICD-10-CM | POA: Diagnosis not present

## 2023-03-28 HISTORY — DX: COVID-19: U07.1

## 2023-03-28 NOTE — Progress Notes (Signed)
Subjective:    Patient ID: Ronald Mead., male    DOB: Feb 21, 1951, 72 y.o.   MRN: [id number]  HPI Here due to COVID infection  Thinks he got it from son--lives with them and tested positive last week (limited exposure though) Started with cough 2 days ago--runny nose and sneezing Persisted to yesterday--and slight headache More in throat and nasal--so did test that was positive  No fever Some chills--no sweats No SOB No headache now--was just slight No pain in ears  Has taken OTC cold and flu medicine---has helped  Current Outpatient Medications on File Prior to Visit  Medication Sig Dispense Refill   allopurinol (ZYLOPRIM) 300 MG tablet Take 1 tablet (300 mg total) by mouth daily. 90 tablet 4   gabapentin (NEURONTIN) 300 MG capsule Take 1 capsule (300 mg total) by mouth at bedtime as needed (Nerve pan). 90 capsule 4   lisinopril (ZESTRIL) 5 MG tablet Take 1 tablet (5 mg total) by mouth daily. 90 tablet 4   Multiple Vitamins-Minerals (CENTRUM SILVER 50+MEN PO) Take 1 tablet by mouth daily.     NON FORMULARY Pt uses a cpap nightly     Omega 3-6-9 Fatty Acids (OMEGA 3-6-9 PO) Take 1,000 mg by mouth in the morning and at bedtime.     Respiratory Therapy Supplies (CARETOUCH CPAP & BIPAP HOSE) MISC by Does not apply route.     No current facility-administered medications on file prior to visit.    Allergies  Allergen Reactions   Ibuprofen     Weight gain   Meloxicam Other (See Comments)    Peripheral edema, weight gain   Omeprazole Diarrhea and Nausea And Vomiting    Vomiting/diarrhea on BID dosing    Past Medical History:  Diagnosis Date   Arthritis    L end stage knee DKD, mild R knee DJD, R hip end stage DJD   DDD (degenerative disc disease), cervical    multilevel   Gout    rare flares   HLD (hyperlipidemia)    Low HDL   Hyperglycemia    Hypertension    Lumbar spinal stenosis 11/15/2014   severe central canal stenosis L3/4;L4/5 bilat facet  hypertrophy, annular disc bulging, grade 1 sphondylolisthesis ([facility]ki) planned PT and ESI ([facility])   Lumbar spinal stenosis    s/p lumbar decompressive surgery 11/2021 (D[facility]onski)   OSA on CPAP    Osteoarthritis    R knee and hip, s/p L TKR   Pre-diabetes     Past Surgical History:  Procedure Laterality Date   cardiolyte  1997   WNL   carotid US  1997   WNL   CATARACT EXTRACTION W/PHACO Left 12/24/2014   Procedure: CATARACT EXTRACTION PHACO AND INTRAOCULAR LENS PLACEMENT (IOC);  Surgeon: Galen Manila, MD; [place] 01:16AP% 26.4Cde 20.17   COLONOSCOPY  1998   WNL   COLONOSCOPY  06/2010   WNL, internal hemorrhoids, rec rpt 10 yrs   COLONOSCOPY  07/2021   TAx3, HPx1, int hem, rpt 5 yrs Marina Goodell)   CT HEAD LIMITED W/CM  1997   WNL   ESOPHAGOGASTRODUODENOSCOPY  1998   gastric polyps, benign   JOINT REPLACEMENT  1968   Left Knee, cartilage removed   KNEE ARTHROSCOPY  1968   cartilage removal   LUMBAR EPIDURAL INJECTION  02/2017   L3-4 interlaminar injection Regino Schultze)   MRI lumbar  2000   mild bulge L3/4, mild foraminal narrowing L4/5, L5/S2, small disk herniation   MRI lumbar  11/2014  severe L3/4, L4/5 central canal stenosis (Dumonski)   SHOULDER SURGERY Right 05/2019   [facility]   TOTAL HIP ARTHROPLASTY Right 03/27/2013   TOTAL HIP ARTHROPLASTY ANTERIOR APPROACH;  Surgeon: Velna Ochs, MD   TOTAL KNEE ARTHROPLASTY Left 09/26/2012   TOTAL KNEE ARTHROPLASTY;  Surgeon: Velna Ochs, MD   TOTAL KNEE REVISION Left 11/30/2022   Procedure: LEFT TOTAL KNEE REVISION;  Surgeon: Marcene Corning, MD;  Location: [facility];  Service: Orthopedics;  Laterality: Left;   TRANSFORAMINAL LUMBAR INTERBODY FUSION (TLIF) WITH PEDICLE SCREW FIXATION 2 LEVEL Left 12/09/2021   Procedure: LEFT-SIDED LUMBAR 3- LUMBAR 4, LUMBAR 4- LUMBAR 5 TRANSFORAMINAL INTERBODY FUSION AND DECOMPRESSION WITH INSTRUMENTATION AND ALLOGRAFT;  Surgeon: Estill Bamberg, MD;  Location: [facility];  Service: Orthopedics;  Laterality:  Left;   TRANSTHORACIC ECHOCARDIOGRAM  12/08/2021   Normal LV size and function.  EF 55-60 %.  No RWMA.  Mild asymmetric basal septal LVH.  Normal diastolic function.  Mild RV dilation with normal function.  Normal MV, mild AOV calcification-sclerosis but no stenosis.    Family History  Problem Relation Age of Onset   Asthma Mother    Cancer Mother        Vaginal; radiation dz of bowel   Stroke Father 27   Diabetes Father    Obesity Sister    Esophageal cancer Neg Hx    Stomach cancer Neg Hx    Rectal cancer Neg Hx    Colon cancer Neg Hx    Heart attack Neg Hx    CAD Neg Hx     Social History   Socioeconomic History   Marital status: Married    Spouse name: Ronald Bullock   Number of children: 4   Years of education: college   Highest education level: Not on file  Occupational History   Occupation: Psychologist, sport and exercise Chem in [employer]  Tobacco Use   Smoking status: Never   Smokeless tobacco: Never  Vaping Use   Vaping status: Never Used  Substance and Sexual Activity   Alcohol use: Yes    Comment: Rare   Drug use: No   Sexual activity: Never  Other Topics Concern   Not on file  Social History Narrative   Lives with wife, has grown son   Activity: no regular exercise-presently limited by left knee pain for 1 prior surgery as well as significant lumbar spinal pain/stenosis with significant radiculopathy down bilateral legs.  Prior to this was ambulatory without any issues.   Diet: good water, some fish, good vegetables, red meat 3x/wk   Right-handed.   Two 32-ounce glasses of tea.   Social Determinants of Health   Financial Resource Strain: Low Risk  (09/15/2022)   Overall Financial Resource Strain (CARDIA)    Difficulty of Paying Living Expenses: Not very hard  Food Insecurity: No Food Insecurity (12/02/2022)   Hunger Vital Sign    Worried About Running Out of Food in the Last Year: Never true    Ran Out of Food in the Last Year: Never  true  Transportation Needs: No Transportation Needs (12/02/2022)   PRAPARE - Administrator, Civil Service (Medical): No    Lack of Transportation (Non-Medical): No  Physical Activity: Sufficiently Active (09/15/2022)   Exercise Vital Sign    Days of Exercise per Week: 3 days    Minutes of Exercise per Session: 60 min  Stress: No Stress Concern Present (09/15/2022)   [employer] of Occupational Health - Occupational Stress  Questionnaire    Feeling of Stress : Only a little  Social Connections: Moderately Isolated (09/15/2022)   Social Connection and Isolation Panel [NHANES]    Frequency of Communication with Friends and Family: More than three times a week    Frequency of Social Gatherings with Friends and Family: More than three times a week    Attends Religious Services: Never    Database administrator or Organizations: No    Attends Banker Meetings: Never    Marital Status: Married  Catering manager Violence: Not At Risk (11/30/2022)   Humiliation, Afraid, Rape, and Kick questionnaire    Fear of Current or Ex-Partner: No    Emotionally Abused: No    Physically Abused: No    Sexually Abused: No   Review of Systems Some change in taste--before the other symptoms. Not since then No N/V Eating okay Did have 3 liquidy stools---but that was 2 days before the other symptoms (was after antacid due to "rolling stomach")    Objective:   Physical Exam Constitutional:      Appearance: Normal appearance.  HENT:     Head:     Comments: No sinus tenderness    Right Ear: Tympanic membrane and ear canal normal.     Left Ear: Tympanic membrane and ear canal normal.     Mouth/Throat:     Pharynx: No oropharyngeal exudate or posterior oropharyngeal erythema.  Pulmonary:     Effort: Pulmonary effort is normal.     Breath sounds: Normal breath sounds. No wheezing or rales.  Musculoskeletal:     Cervical back: Neck supple.  Lymphadenopathy:     Cervical: No  cervical adenopathy.  Neurological:     Mental Status: He is alert.            Assessment & Plan:

## 2023-03-28 NOTE — Telephone Encounter (Signed)
Patient has appointment today with Dr. Alphonsus Sias. Called patient offered to change to virtual. Patient declined would like to come in the office.

## 2023-03-28 NOTE — Telephone Encounter (Signed)
That is okay---I am seeing COVID patients every day in clinic already

## 2023-03-28 NOTE — Assessment & Plan Note (Signed)
Mild infection Started 2 days ago Discussed tylenol---cough suppressant prn Discussed paxlovid---he prefers not and I don't feel strongly about it. Should reconsider if he feels worse tomorrow ER if sig SOB

## 2023-04-13 ENCOUNTER — Telehealth: Payer: Self-pay | Admitting: Family Medicine

## 2023-04-13 NOTE — Telephone Encounter (Signed)
I have not received this yet. No records of them calling our office last week at all.

## 2023-04-13 NOTE — Telephone Encounter (Signed)
[facility] called asking if Dr. Reece Agar was able to comp a form for the pt? Lab didn't state what type of form was faxed. Lab stated the form was faxed last week & they've followed up 3x last week regarding form being comp. Please advise. Call back # [phone]

## 2023-04-15 NOTE — Telephone Encounter (Signed)
Lvm at [facility] asking them to call back. Need to let them know we haven't received a form from them. They can try faxing again to [place] at [phone].

## 2023-04-19 NOTE — Telephone Encounter (Signed)
Lvm at [facility] asking them to call back. Need to let them know we haven't received a form from them. They can try faxing again to [place] at [phone].

## 2023-04-20 NOTE — Telephone Encounter (Signed)
Lvm at [facility] asking them to call back. Need to let them know we haven't received a form from them. They can try faxing again to [place] at [phone].   Closing encounter due no response after several attempts to contact.

## 2023-04-22 NOTE — Telephone Encounter (Signed)
Per Dr. Reece Agar Ronald Bullock needs to be scheduled for an appointment to discuss this Neurology testing.  Attempted to call Ronald Bullock to be sure he requested the testing.  If interested in testing Ronald Bullock should be scheduled with Dr. Reece Agar to discuss the request.

## 2023-04-22 NOTE — Telephone Encounter (Signed)
[facility] called regarding this form,and the whole time she have been faxing to the wrong number. I gave her the correct number,she's refaxing  and would like it to be signed and sent back as soon as possible.

## 2023-04-25 NOTE — Telephone Encounter (Signed)
Called and spoke to patient he has not requested this test. He does not want to have order filled out. Advised will make note in chart and shred order. Will call if any questions.

## 2023-04-26 ENCOUNTER — Telehealth: Payer: Self-pay | Admitting: Cardiology

## 2023-04-26 ENCOUNTER — Ambulatory Visit (HOSPITAL_COMMUNITY): Payer: Medicare Other | Attending: Cardiology

## 2023-04-26 DIAGNOSIS — I77810 Thoracic aortic ectasia: Secondary | ICD-10-CM | POA: Diagnosis not present

## 2023-04-26 DIAGNOSIS — I7121 Aneurysm of the ascending aorta, without rupture: Secondary | ICD-10-CM | POA: Diagnosis not present

## 2023-04-26 LAB — ECHOCARDIOGRAM COMPLETE
Area-P 1/2: 2.97 cm2
S' Lateral: 2.8 cm

## 2023-04-26 NOTE — Telephone Encounter (Signed)
Patient is requesting a call back to discuss echo results. 

## 2023-04-26 NOTE — Telephone Encounter (Signed)
Pt calling back again for results

## 2023-04-27 ENCOUNTER — Telehealth: Payer: Self-pay | Admitting: Cardiology

## 2023-04-27 NOTE — Telephone Encounter (Signed)
Pt is returning nurses call. Please advise.

## 2023-04-27 NOTE — Telephone Encounter (Signed)
Spoke with patient and he is aware of echo results and provider recommends no change in medication therapy. He verbalized understanding

## 2023-04-27 NOTE — Telephone Encounter (Signed)
See result note.  

## 2023-05-11 DIAGNOSIS — Z23 Encounter for immunization: Secondary | ICD-10-CM | POA: Diagnosis not present

## 2023-06-29 DIAGNOSIS — Z23 Encounter for immunization: Secondary | ICD-10-CM | POA: Diagnosis not present

## 2023-08-03 ENCOUNTER — Encounter: Payer: Self-pay | Admitting: Family Medicine

## 2023-08-03 NOTE — Telephone Encounter (Signed)
 Care team updated and letter sent for eye exam notes.

## 2023-09-18 ENCOUNTER — Other Ambulatory Visit: Payer: Self-pay | Admitting: Family Medicine

## 2023-09-18 DIAGNOSIS — E785 Hyperlipidemia, unspecified: Secondary | ICD-10-CM

## 2023-09-18 DIAGNOSIS — M1A00X0 Idiopathic chronic gout, unspecified site, without tophus (tophi): Secondary | ICD-10-CM

## 2023-09-18 DIAGNOSIS — D649 Anemia, unspecified: Secondary | ICD-10-CM

## 2023-09-18 DIAGNOSIS — R7303 Prediabetes: Secondary | ICD-10-CM

## 2023-09-18 DIAGNOSIS — Z125 Encounter for screening for malignant neoplasm of prostate: Secondary | ICD-10-CM

## 2023-09-19 ENCOUNTER — Other Ambulatory Visit: Payer: Medicare Other

## 2023-09-19 ENCOUNTER — Ambulatory Visit (INDEPENDENT_AMBULATORY_CARE_PROVIDER_SITE_OTHER): Payer: Medicare Other

## 2023-09-19 VITALS — Ht 71.75 in | Wt 210.0 lb

## 2023-09-19 DIAGNOSIS — Z0000 Encounter for general adult medical examination without abnormal findings: Secondary | ICD-10-CM

## 2023-09-19 NOTE — Patient Instructions (Signed)
[name] , Thank you for taking time to come for your Medicare Wellness Visit. I appreciate your ongoing commitment to your health goals. Please review the following plan we discussed and let me know if I can assist you in the future.   Referrals/Orders/Follow-Ups/Clinician Recommendations: none  This is a list of the screening recommended for you and due dates:  Health Maintenance  Topic Date Due   Yearly kidney health urinalysis for diabetes  Never done   Eye exam for diabetics  10/15/2019   Complete foot exam   03/10/2021   Hemoglobin A1C  05/20/2023   Yearly kidney function blood test for diabetes  01/06/2024   DTaP/Tdap/Td vaccine (4 - Td or Tdap) 03/03/2024   Medicare Annual Wellness Visit  09/18/2024   Colon Cancer Screening  08/05/2026   Pneumonia Vaccine  Completed   Flu Shot  Completed   COVID-19 Vaccine  Completed   Hepatitis C Screening  Completed   Zoster (Shingles) Vaccine  Completed   HPV Vaccine  Aged Out    Advanced directives: (In Chart) A copy of your advanced directives are scanned into your chart should your provider ever need it.  Next Medicare Annual Wellness Visit scheduled for next year: Yes 09/19/2024 @ 8:50am televisit

## 2023-09-19 NOTE — Progress Notes (Signed)
Subjective:   Ronald Bullock. is a 73 y.o. male who presents for Medicare Annual/Subsequent preventive examination.  Visit Complete: Virtual I connected with  Ronald Bullock. on 09/19/23 by a audio enabled telemedicine application and verified that I am speaking with the correct person using two identifiers.  Patient Location: Home  Provider Location: Home Office  I discussed the limitations of evaluation and management by telemedicine. The patient expressed understanding and agreed to proceed.  Vital Signs: Because this visit was a virtual/telehealth visit, some criteria may be missing or patient reported. Any vitals not documented were not able to be obtained and vitals that have been documented are patient reported.  Patient Medicare AWV questionnaire was completed by the patient on (not done); I have confirmed that all information answered by patient is correct and no changes since this date.  Cardiac Risk Factors include: advanced age (>7men, >20 women);dyslipidemia;hypertension;male gender    Objective:    Today's Vitals   09/19/23 0848 09/19/23 0849  Weight: 210 lb (95.3 kg)   Height: 5' 11.75" (1.822 m)   PainSc:  1    Body mass index is 28.68 kg/m.     09/19/2023    9:26 AM 01/06/2023   11:22 AM 11/30/2022   11:50 AM 11/18/2022   10:43 AM 09/15/2022    9:57 AM 12/09/2021    6:31 AM 12/01/2021    9:14 AM  Advanced Directives  Does Patient Have a Medical Advance Directive? Yes Yes Yes Yes Yes Yes Yes  Type of Estate agent of [place];Living will Healthcare Power of [place];Living will Living will Healthcare Power of [place];Living will Healthcare Power of [place];Living will  Healthcare Power of [place];Living will  Does patient want to make changes to medical advance directive?   No - Guardian declined  No - Patient declined  No - Patient declined  Copy of Healthcare Power of Attorney in Chart? Yes - validated most recent copy  scanned in chart (See row information)  No - copy requested Yes - validated most recent copy scanned in chart (See row information) Yes - validated most recent copy scanned in chart (See row information)  No - copy requested    Current Medications (verified) Outpatient Encounter Medications as of 09/19/2023  Medication Sig   allopurinol (ZYLOPRIM) 300 MG tablet Take 1 tablet (300 mg total) by mouth daily.   gabapentin (NEURONTIN) 300 MG capsule Take 1 capsule (300 mg total) by mouth at bedtime as needed (Nerve pan).   lisinopril (ZESTRIL) 5 MG tablet Take 1 tablet (5 mg total) by mouth daily.   Multiple Vitamins-Minerals (CENTRUM SILVER 50+MEN PO) Take 1 tablet by mouth daily.   Omega 3-6-9 Fatty Acids (OMEGA 3-6-9 PO) Take 1,000 mg by mouth in the morning and at bedtime.   Respiratory Therapy Supplies (CARETOUCH CPAP & BIPAP HOSE) MISC by Does not apply route.   NON FORMULARY Pt uses a cpap nightly   No facility-administered encounter medications on file as of 09/19/2023.    Allergies (verified) Ibuprofen, Meloxicam, and Omeprazole   History: Past Medical History:  Diagnosis Date   Arthritis    L end stage knee DKD, mild R knee DJD, R hip end stage DJD   DDD (degenerative disc disease), cervical    multilevel   Gout    rare flares   HLD (hyperlipidemia)    Low HDL   Hyperglycemia    Hypertension    Lumbar spinal stenosis 11/15/2014   severe central  canal stenosis L3/4;L4/5 bilat facet hypertrophy, annular disc bulging, grade 1 sphondylolisthesis (Dumonski) planned PT and ESI (Wang)   Lumbar spinal stenosis    s/p lumbar decompressive surgery 11/2021 (Dumonski)   OSA on CPAP    Osteoarthritis    R knee and hip, s/p L TKR   Pre-diabetes    Past Surgical History:  Procedure Laterality Date   cardiolyte  1997   WNL   carotid US  1997   WNL   CATARACT EXTRACTION W/PHACO Left 12/24/2014   Procedure: CATARACT EXTRACTION PHACO AND INTRAOCULAR LENS PLACEMENT (IOC);  Surgeon:  Galen Manila, MD; [place] 01:16AP% 26.4Cde 20.17   COLONOSCOPY  1998   WNL   COLONOSCOPY  06/2010   WNL, internal hemorrhoids, rec rpt 10 yrs   COLONOSCOPY  07/2021   TAx3, HPx1, int hem, rpt 5 yrs Marina Goodell)   CT HEAD LIMITED W/CM  1997   WNL   ESOPHAGOGASTRODUODENOSCOPY  1998   gastric polyps, benign   JOINT REPLACEMENT  1968   Left Knee, cartilage removed   KNEE ARTHROSCOPY  1968   cartilage removal   LUMBAR EPIDURAL INJECTION  02/2017   L3-4 interlaminar injection Regino Schultze)   MRI lumbar  2000   mild bulge L3/4, mild foraminal narrowing L4/5, L5/S2, small disk herniation   MRI lumbar  11/2014   severe L3/4, L4/5 central canal stenosis (Dumonski)   SHOULDER SURGERY Right 05/2019   [facility]   TOTAL HIP ARTHROPLASTY Right 03/27/2013   TOTAL HIP ARTHROPLASTY ANTERIOR APPROACH;  Surgeon: Velna Ochs, MD   TOTAL KNEE ARTHROPLASTY Left 09/26/2012   TOTAL KNEE ARTHROPLASTY;  Surgeon: Velna Ochs, MD   TOTAL KNEE REVISION Left 11/30/2022   Procedure: LEFT TOTAL KNEE REVISION;  Surgeon: Marcene Corning, MD;  Location: [facility];  Service: Orthopedics;  Laterality: Left;   TRANSFORAMINAL LUMBAR INTERBODY FUSION (TLIF) WITH PEDICLE SCREW FIXATION 2 LEVEL Left 12/09/2021   Procedure: LEFT-SIDED LUMBAR 3- LUMBAR 4, LUMBAR 4- LUMBAR 5 TRANSFORAMINAL INTERBODY FUSION AND DECOMPRESSION WITH INSTRUMENTATION AND ALLOGRAFT;  Surgeon: Estill Bamberg, MD;  Location: [facility];  Service: Orthopedics;  Laterality: Left;   TRANSTHORACIC ECHOCARDIOGRAM  12/08/2021   Normal LV size and function.  EF 55-60 %.  No RWMA.  Mild asymmetric basal septal LVH.  Normal diastolic function.  Mild RV dilation with normal function.  Normal MV, mild AOV calcification-sclerosis but no stenosis.   Family History  Problem Relation Age of Onset   Asthma Mother    Cancer Mother        Vaginal; radiation dz of bowel   Stroke Father 38   Diabetes Father    Obesity Sister    Esophageal cancer Neg Hx    Stomach cancer Neg Hx     Rectal cancer Neg Hx    Colon cancer Neg Hx    Heart attack Neg Hx    CAD Neg Hx    Social History   Socioeconomic History   Marital status: Married    Spouse name: Ronald Bullock   Number of children: 4   Years of education: college   Highest education level: Not on file  Occupational History   Occupation: Psychologist, sport and exercise Chem in [employer]  Tobacco Use   Smoking status: Never   Smokeless tobacco: Never  Vaping Use   Vaping status: Never Used  Substance and Sexual Activity   Alcohol use: Yes    Comment: Rare   Drug use: No   Sexual activity: Never  Other Topics  Concern   Not on file  Social History Narrative   Lives with wife, has grown son   Activity: no regular exercise-presently limited by left knee pain for 1 prior surgery as well as significant lumbar spinal pain/stenosis with significant radiculopathy down bilateral legs.  Prior to this was ambulatory without any issues.   Diet: good water, some fish, good vegetables, red meat 3x/wk   Right-handed.   Two 32-ounce glasses of tea.   Social Drivers of Corporate investment banker Strain: Low Risk  (09/19/2023)   Overall Financial Resource Strain (CARDIA)    Difficulty of Paying Living Expenses: Not very hard  Food Insecurity: No Food Insecurity (09/19/2023)   Hunger Vital Sign    Worried About Running Out of Food in the Last Year: Never true    Ran Out of Food in the Last Year: Never true  Transportation Needs: No Transportation Needs (09/19/2023)   PRAPARE - Administrator, Civil Service (Medical): No    Lack of Transportation (Non-Medical): No  Physical Activity: Sufficiently Active (09/19/2023)   Exercise Vital Sign    Days of Exercise per Week: 3 days    Minutes of Exercise per Session: 60 min  Stress: No Stress Concern Present (09/19/2023)   [employer] of Occupational Health - Occupational Stress Questionnaire    Feeling of Stress : Only a little  Social Connections:  Moderately Isolated (09/19/2023)   Social Connection and Isolation Panel [NHANES]    Frequency of Communication with Friends and Family: More than three times a week    Frequency of Social Gatherings with Friends and Family: More than three times a week    Attends Religious Services: Never    Database administrator or Organizations: No    Attends Engineer, structural: Never    Marital Status: Married    Tobacco Counseling Counseling given: Not Answered  Clinical Intake:  Pre-visit preparation completed: No  Pain : 0-10 Pain Score: 1  Pain Type: Chronic pain Pain Location: Groin Pain Orientation: Left Pain Descriptors / Indicators: Discomfort Pain Onset: More than a month ago Pain Frequency: Intermittent Pain Relieving Factors: Tylenol sometimes  Pain Relieving Factors: Tylenol sometimes  BMI - recorded: 28.68 Nutritional Status: BMI 25 -29 Overweight Nutritional Risks: None Diabetes: No  How often do you need to have someone help you when you read instructions, pamphlets, or other written materials from your doctor or pharmacy?: 1 - Never  Interpreter Needed?: No  Comments: lives with wife and son Information entered by :: Ronald Keltz,LPN   Activities of Daily Living    09/19/2023    9:27 AM 11/30/2022   11:50 AM  In your present state of health, do you have any difficulty performing the following activities:  Hearing? 0 0  Vision? 0 0  Difficulty concentrating or making decisions? 0 0  Walking or climbing stairs? 0 0  Dressing or bathing? 0 0  Doing errands, shopping? 0 0  Preparing Food and eating ? N   Using the Toilet? N   In the past six months, have you accidently leaked urine? N   Do you have problems with loss of bowel control? N   Managing your Medications? N   Managing your Finances? N   Housekeeping or managing your Housekeeping? N     Patient Care Team: [name], MD as PCP - General Herbie [place] Piedad Climes, MD as PCP - Cardiology  (Cardiology) Marcene Corning, MD as Consulting Physician (Orthopedic Surgery) Yevette Edwards,  Loraine Leriche, MD as Consulting Physician (Orthopedic Surgery) Claria Dice, MD as Attending Physician (Physical Medicine and Rehabilitation) Breck Coons, DDS as Referring Physician (Dentistry) Pa, [facility])  Indicate any recent Medical Services you may have received from other than [facility] providers in the past year (date may be approximate).     Assessment:   This is a routine wellness examination for Ronald Bullock.  Hearing/Vision screen Hearing Screening - Comments:: Pt says his hearing is good Vision Screening - Comments:: Pt says his vision is good w/glasses;rt cataract needs removing but has not changed vision Dr Mechele Collin    Goals Addressed             This Visit's Progress    COMPLETED: DIET - EAT MORE FRUITS AND VEGETABLES       COMPLETED: Patient Stated   On track    Starting 08/30/2018, I will continue to take medications as prescribed.      Patient Stated   On track    09/05/2019, I will maintain and continue medications as prescribed.      Patient Stated   On track    Would like to maintain current routine.The current medical regimen is effective;  continue present plan and medications. 09/19/2023       Depression Screen    09/19/2023    9:08 AM 01/12/2023    4:09 PM 09/21/2022    9:34 AM 09/15/2022    9:55 AM 09/14/2021    9:09 AM 09/12/2020    9:53 AM 09/05/2019    9:47 AM  PHQ 2/9 Scores  PHQ - 2 Score 0 2 0 0 0 0 0  PHQ- 9 Score  4  0  0 0    Fall Risk    09/19/2023    8:56 AM 01/12/2023    4:09 PM 09/21/2022    9:33 AM 09/15/2022    9:59 AM 09/14/2021    9:07 AM  Fall Risk   Falls in the past year? 0 1 1 1  0  Number falls in past yr: 0 0 0 0 0  Injury with Fall? 0 0 0 0 0  Risk for fall due to : No Fall Risks   History of fall(s) No Fall Risks  Follow up Education provided;Falls prevention discussed   Falls prevention discussed;Falls evaluation completed Falls  prevention discussed    MEDICARE RISK AT HOME: Medicare Risk at Home Any stairs in or around the home?: Yes If so, are there any without handrails?: Yes Home free of loose throw rugs in walkways, pet beds, electrical cords, etc?: Yes Adequate lighting in your home to reduce risk of falls?: Yes Life alert?: No Use of a cane, walker or w/c?: No Grab bars in the bathroom?: No Shower chair or bench in shower?: No Elevated toilet seat or a handicapped toilet?: Yes  TIMED UP AND GO:  Was the test performed?  No    Cognitive Function:    09/12/2020    9:56 AM 09/05/2019    9:50 AM 08/30/2018    8:28 AM 08/25/2017    8:43 AM 08/04/2016    8:56 AM  MMSE - Mini Mental State Exam  Orientation to time 5 5 5 5 5   Orientation to Place 5 5 5 5 5   Registration 3 3 3 3 3   Attention/ Calculation 5 5 0 0 0  Recall 3 3 3 2 3   Recall-comments    unable to recall 1 of 3 words   Language- name 2  objects   0 0 0  Language- repeat 1 1 1 1 1   Language- follow 3 step command   3 3 3   Language- read & follow direction   0 0 0  Write a sentence   0 0 0  Copy design   0 0 0  Total score   20 19 20         09/19/2023    9:28 AM 09/15/2022   10:03 AM  6CIT Screen  What Year? 0 points 0 points  What month? 0 points 0 points  What time? 0 points 0 points  Count back from 20 0 points 0 points  Months in reverse 0 points 0 points  Repeat phrase 0 points 0 points  Total Score 0 points 0 points    Immunizations Immunization History  Administered Date(s) Administered   Fluad Quad(high Dose 65+) 09/16/2020, 09/16/2021, 05/31/2022   Influenza, High Dose Seasonal PF 06/16/2018, 08/03/2019, 05/11/2023   PFIZER(Purple Top)SARS-COV-2 Vaccination 09/27/2019, 10/18/2019, 06/13/2020, 03/12/2021   PNEUMOCOCCAL CONJUGATE-20 09/29/2021   Pfizer(Comirnaty)Fall Seasonal Vaccine 12 years and older 05/31/2022, 06/29/2023   Pneumococcal Conjugate-13 02/05/2020   Td 07/16/2001, 11/18/2008   Tdap 03/03/2014    Zoster Recombinant(Shingrix) 03/10/2020, 07/01/2020    TDAP status: Up to date  Flu Vaccine status: Up to date  Pneumococcal vaccine status: Up to date  Covid-19 vaccine status: Completed vaccines  Qualifies for Shingles Vaccine? Yes   Zostavax completed Yes   Shingrix Completed?: Yes  Screening Tests Health Maintenance  Topic Date Due   Diabetic kidney evaluation - Urine ACR  Never done   OPHTHALMOLOGY EXAM  10/15/2019   FOOT EXAM  03/10/2021   HEMOGLOBIN A1C  05/20/2023   Diabetic kidney evaluation - eGFR measurement  01/06/2024   DTaP/Tdap/Td (4 - Td or Tdap) 03/03/2024   Medicare Annual Wellness (AWV)  09/18/2024   Colonoscopy  08/05/2026   Pneumonia Vaccine 72+ Years old  Completed   INFLUENZA VACCINE  Completed   COVID-19 Vaccine  Completed   Hepatitis C Screening  Completed   Zoster Vaccines- Shingrix  Completed   HPV VACCINES  Aged Out    Health Maintenance  Health Maintenance Due  Topic Date Due   Diabetic kidney evaluation - Urine ACR  Never done   OPHTHALMOLOGY EXAM  10/15/2019   FOOT EXAM  03/10/2021   HEMOGLOBIN A1C  05/20/2023    Colorectal cancer screening: Type of screening: Colonoscopy. Completed 08/05/21. Repeat every 5 years  Lung Cancer Screening: (Low Dose CT Chest recommended if Age 69-80 years, 20 pack-year currently smoking OR have quit w/in 15years.) does not qualify.   Lung Cancer Screening Referral: no  Additional Screening:  Hepatitis C Screening: does not qualify; Completed 06/16/2015  Vision Screening: Recommended annual ophthalmology exams for early detection of glaucoma and other disorders of the eye. Is the patient up to date with their annual eye exam?  Yes  Who is the provider or what is the name of the office in which the patient attends annual eye exams? Dr Mechele Collin If pt is not established with a provider, would they like to be referred to a provider to establish care? No .   Dental Screening: Recommended annual dental  exams for proper oral hygiene  Diabetic Foot Exam: n/a  Community Resource Referral / Chronic Care Management: CRR required this visit?  No   CCM required this visit?  No    Plan:     I have personally reviewed and noted the following in  the patient's chart:   Medical and social history Use of alcohol, tobacco or illicit drugs  Current medications and supplements including opioid prescriptions. Patient is not currently taking opioid prescriptions. Functional ability and status Nutritional status Physical activity Advanced directives List of other physicians Hospitalizations, surgeries, and ER visits in previous 12 months Vitals Screenings to include cognitive, depression, and falls Referrals and appointments  In addition, I have reviewed and discussed with patient certain preventive protocols, quality metrics, and best practice recommendations. A written personalized care plan for preventive services as well as general preventive health recommendations were provided to patient.     Ronald Lush, LPN   4/0/9811   After Visit Summary: (MyChart) Due to this being a telephonic visit, the after visit summary with patients personalized plan was offered to patient via MyChart   Nurse Notes: Pt says he has been having intermittent left groin discomfort for a month or two. Pt says he will discuss at next appt this month. He has no other concerns or questions.

## 2023-09-20 ENCOUNTER — Other Ambulatory Visit (INDEPENDENT_AMBULATORY_CARE_PROVIDER_SITE_OTHER): Payer: Medicare Other

## 2023-09-20 DIAGNOSIS — R7303 Prediabetes: Secondary | ICD-10-CM

## 2023-09-20 DIAGNOSIS — M1A00X0 Idiopathic chronic gout, unspecified site, without tophus (tophi): Secondary | ICD-10-CM | POA: Diagnosis not present

## 2023-09-20 DIAGNOSIS — E785 Hyperlipidemia, unspecified: Secondary | ICD-10-CM | POA: Diagnosis not present

## 2023-09-20 DIAGNOSIS — D649 Anemia, unspecified: Secondary | ICD-10-CM

## 2023-09-20 DIAGNOSIS — Z125 Encounter for screening for malignant neoplasm of prostate: Secondary | ICD-10-CM

## 2023-09-20 LAB — CBC WITH DIFFERENTIAL/PLATELET
Basophils Absolute: 0 10*3/uL (ref 0.0–0.1)
Basophils Relative: 0.6 % (ref 0.0–3.0)
Eosinophils Absolute: 0.2 10*3/uL (ref 0.0–0.7)
Eosinophils Relative: 4 % (ref 0.0–5.0)
HCT: 37.5 % — ABNORMAL LOW (ref 39.0–52.0)
Hemoglobin: 12.5 g/dL — ABNORMAL LOW (ref 13.0–17.0)
Lymphocytes Relative: 27.6 % (ref 12.0–46.0)
Lymphs Abs: 1.6 10*3/uL (ref 0.7–4.0)
MCHC: 33.4 g/dL (ref 30.0–36.0)
MCV: 93.8 fL (ref 78.0–100.0)
Monocytes Absolute: 0.8 10*3/uL (ref 0.1–1.0)
Monocytes Relative: 12.9 % — ABNORMAL HIGH (ref 3.0–12.0)
Neutro Abs: 3.2 10*3/uL (ref 1.4–7.7)
Neutrophils Relative %: 54.9 % (ref 43.0–77.0)
Platelets: 194 10*3/uL (ref 150.0–400.0)
RBC: 4 Mil/uL — ABNORMAL LOW (ref 4.22–5.81)
RDW: 15.5 % (ref 11.5–15.5)
WBC: 5.8 10*3/uL (ref 4.0–10.5)

## 2023-09-20 LAB — COMPREHENSIVE METABOLIC PANEL
ALT: 24 U/L (ref 0–53)
AST: 24 U/L (ref 0–37)
Albumin: 4.1 g/dL (ref 3.5–5.2)
Alkaline Phosphatase: 72 U/L (ref 39–117)
BUN: 27 mg/dL — ABNORMAL HIGH (ref 6–23)
CO2: 26 meq/L (ref 19–32)
Calcium: 8.7 mg/dL (ref 8.4–10.5)
Chloride: 103 meq/L (ref 96–112)
Creatinine, Ser: 1.17 mg/dL (ref 0.40–1.50)
GFR: 61.55 mL/min (ref >60.00)
Glucose, Bld: 122 mg/dL — ABNORMAL HIGH (ref 70–99)
Potassium: 5.1 meq/L (ref 3.5–5.1)
Sodium: 137 meq/L (ref 135–145)
Total Bilirubin: 0.5 mg/dL (ref 0.2–1.2)
Total Protein: 6.5 g/dL (ref 6.0–8.3)

## 2023-09-20 LAB — LIPID PANEL
Cholesterol: 148 mg/dL (ref 0–200)
HDL: 29.9 mg/dL — ABNORMAL LOW (ref >39.00)
LDL Cholesterol: 83 mg/dL (ref 0–99)
NonHDL: 117.92
Total CHOL/HDL Ratio: 5
Triglycerides: 177 mg/dL — ABNORMAL HIGH (ref 0.0–149.0)
VLDL: 35.4 mg/dL (ref 0.0–40.0)

## 2023-09-20 LAB — URIC ACID: Uric Acid, Serum: 6 mg/dL (ref 4.0–7.8)

## 2023-09-20 LAB — PSA, MEDICARE: PSA: 1.6 ng/mL (ref 0.10–4.00)

## 2023-09-20 LAB — VITAMIN B12: Vitamin B-12: 333 pg/mL (ref 211–911)

## 2023-09-20 LAB — HEMOGLOBIN A1C: Hgb A1c MFr Bld: 6.5 % (ref 4.6–6.5)

## 2023-09-26 ENCOUNTER — Encounter: Payer: Self-pay | Admitting: Family Medicine

## 2023-09-26 ENCOUNTER — Ambulatory Visit (INDEPENDENT_AMBULATORY_CARE_PROVIDER_SITE_OTHER): Payer: Medicare Other | Admitting: Family Medicine

## 2023-09-26 VITALS — BP 136/68 | HR 67 | Temp 98.0°F | Ht 71.5 in | Wt 226.1 lb

## 2023-09-26 DIAGNOSIS — M1A00X0 Idiopathic chronic gout, unspecified site, without tophus (tophi): Secondary | ICD-10-CM | POA: Diagnosis not present

## 2023-09-26 DIAGNOSIS — I10 Essential (primary) hypertension: Secondary | ICD-10-CM | POA: Diagnosis not present

## 2023-09-26 DIAGNOSIS — E785 Hyperlipidemia, unspecified: Secondary | ICD-10-CM | POA: Diagnosis not present

## 2023-09-26 DIAGNOSIS — Z6379 Other stressful life events affecting family and household: Secondary | ICD-10-CM | POA: Diagnosis not present

## 2023-09-26 DIAGNOSIS — D649 Anemia, unspecified: Secondary | ICD-10-CM | POA: Diagnosis not present

## 2023-09-26 DIAGNOSIS — I358 Other nonrheumatic aortic valve disorders: Secondary | ICD-10-CM

## 2023-09-26 DIAGNOSIS — K4090 Unilateral inguinal hernia, without obstruction or gangrene, not specified as recurrent: Secondary | ICD-10-CM | POA: Diagnosis not present

## 2023-09-26 DIAGNOSIS — G4733 Obstructive sleep apnea (adult) (pediatric): Secondary | ICD-10-CM | POA: Diagnosis not present

## 2023-09-26 DIAGNOSIS — E66811 Obesity, class 1: Secondary | ICD-10-CM

## 2023-09-26 DIAGNOSIS — E1169 Type 2 diabetes mellitus with other specified complication: Secondary | ICD-10-CM | POA: Diagnosis not present

## 2023-09-26 DIAGNOSIS — Z96652 Presence of left artificial knee joint: Secondary | ICD-10-CM

## 2023-09-26 DIAGNOSIS — Z6831 Body mass index (BMI) 31.0-31.9, adult: Secondary | ICD-10-CM

## 2023-09-26 DIAGNOSIS — I77810 Thoracic aortic ectasia: Secondary | ICD-10-CM

## 2023-09-26 DIAGNOSIS — I452 Bifascicular block: Secondary | ICD-10-CM | POA: Diagnosis not present

## 2023-09-26 DIAGNOSIS — Z7189 Other specified counseling: Secondary | ICD-10-CM

## 2023-09-26 MED ORDER — LISINOPRIL 5 MG PO TABS: 5.0000 mg | ORAL_TABLET | Freq: Every day | ORAL | 4 refills | Status: AC

## 2023-09-26 MED ORDER — GABAPENTIN 300 MG PO CAPS: 300.0000 mg | ORAL_CAPSULE | Freq: Every day | ORAL | 4 refills | Status: AC

## 2023-09-26 MED ORDER — ALLOPURINOL 300 MG PO TABS: 300.0000 mg | ORAL_TABLET | Freq: Every day | ORAL | 4 refills | Status: AC

## 2023-09-26 NOTE — Assessment & Plan Note (Signed)
 -  Continue nightly CPAP

## 2023-09-26 NOTE — Assessment & Plan Note (Signed)
 Encouraged healthy diet and lifestyle choices to affect sustainable weight loss.  ?

## 2023-09-26 NOTE — Assessment & Plan Note (Signed)
 Followed by cardiology

## 2023-09-26 NOTE — Assessment & Plan Note (Signed)
Chronic, stable period on allopurinol without recent gout flare.

## 2023-09-26 NOTE — Assessment & Plan Note (Signed)
 Support provided.

## 2023-09-26 NOTE — Patient Instructions (Addendum)
 Good to see you today Work on decreased added sugar, sweetened beverages, watch portion sizes, exercise as tolerated and weight loss.  We will refer you to general surgeon for evaluation of left sided inguinal hernia.  Return in 6 months for diabetes check.

## 2023-09-26 NOTE — Progress Notes (Signed)
 Ph: [phone] Fax: [phone]   Patient ID: Ronald Bullock., male    DOB: 08-09-1951, 73 y.o.   MRN: [id number]  This visit was conducted in person.  BP 136/68   Pulse 67   Temp 98 F (36.7 C) (Oral)   Ht 5' 11.5" (1.816 m)   Wt 226 lb 2 oz (102.6 kg)   SpO2 98%   BMI 31.10 kg/m    CC: AMW f/u visit  Subjective:   HPI: Ronald Bullock. is a 73 y.o. male presenting on 09/26/2023 for Annual Exam (MCR prt 2 [AWV- 09/19/23].)   Stressful family situation - both daughters going through divorce.  Increased night time snacking with resultant weight gain.   Saw health advisor last week for medicare wellness visit. Note reviewed.  Intermittent L groin discomfort for 1-2 months, describes burning sensation with possible bulge, some better in the past week. He has been more active and may have done some heavy lifting.   No results found.  Flowsheet Row Clinical Support from 09/19/2023 in [facility]  PHQ-2 Total Score 0          09/19/2023    8:56 AM 01/12/2023    4:09 PM 09/21/2022    9:33 AM 09/15/2022    9:59 AM 09/14/2021    9:07 AM  Fall Risk   Falls in the past year? 0 1 1 1  0  Number falls in past yr: 0 0 0 0 0  Injury with Fall? 0 0 0 0 0  Risk for fall due to : No Fall Risks   History of fall(s) No Fall Risks  Follow up Education provided;Falls prevention discussed   Falls prevention discussed;Falls evaluation completed Falls prevention discussed    Off aspirin  due to easy bruising/bleeding.   Spinal stenosis - on gabapentin  300mg  nightly - this also helps his sleep. Notes trouble with sleep maintenance insomnia - wakes up at 3-5am. Bedtime is 11pm. Averages 7-8 hours/night. Reviewed bedtime routine.   Bifascicular block overall asymptomatic. Sees cardiology yearly, rec avoiding AV nodal blocking agents. Heart US  reassuringly ok. Alsos een for ascending aortic dilation and aortic valve sclerosis.   OSA - on autoCPAP  nightly, 5-18 pressure setting. Sees ENT Dr Donalee Fruits for this.  L painful knee s/p replacement - found to have loosening of implant - s/p knee revision replacement under spinal anesthesia (Dalldorf) 11/2022.    Preventative: COLONOSCOPY 07/2021 - TAx3, HPx1, int hem, rpt 5 yrs Elvin Hammer) Prostate - strong stream, no nocturia. No fmhx prostate cancer.  Lung cancer screening - not eligible  Flu - yearly  COVID vaccine Pfizer 09/2019, 10/2019, booster 05/2020, 02/2021, 05/2022 Prevnar-13 01/2020, prevnar-20 09/2021 Tdap 2015 RSV - declines Shingles - 02/2020, 06/2020 Advanced directive at home, copy scanned in chart 11/2018. Heith Palos is HCPOA. Does not want prolonged life support if irreversible terminal condition. Healthcare agent directive should be followed over advanced directive.  Seat belt use discussed Sunscreen use discussed, no changing moles on skin. Wears hat outdoors.  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 Interim medical history since our last visit reviewed. Allergies and medications reviewed and updated. Outpatient Medications Prior to Visit  Medication Sig Dispense Refill   Multiple Vitamins-Minerals (CENTRUM SILVER 50+MEN PO) Take 1 tablet by mouth daily.     Omega 3-6-9 Fatty Acids (OMEGA 3-6-9 PO) Take 1,000 mg by mouth in the morning and at bedtime.     Respiratory Therapy Supplies (CARETOUCH CPAP & BIPAP HOSE) MISC by Does not apply route.     allopurinol  (ZYLOPRIM ) 300 MG tablet Take 1 tablet (300 mg total) by mouth daily. 90 tablet 4   gabapentin   (NEURONTIN ) 300 MG capsule Take 1 capsule (300 mg total) by mouth at bedtime as needed (Nerve pan). 90 capsule 4   lisinopril  (ZESTRIL ) 5 MG tablet Take 1 tablet (5 mg total) by mouth daily. 90 tablet 4   NON FORMULARY Pt uses a cpap nightly     No facility-administered medications prior to visit.     Per HPI unless specifically indicated in ROS section below Review of Systems  Objective:  BP 136/68   Pulse 67   Temp 98 F (36.7 C) (Oral)   Ht 5' 11.5" (1.816 m)   Wt 226 lb 2 oz (102.6 kg)   SpO2 98%   BMI 31.10 kg/m   Wt Readings from Last 3 Encounters:  09/26/23 226 lb 2 oz (102.6 kg)  09/19/23 210 lb (95.3 kg)  03/28/23 221 lb (100.2 kg)    Ht Readings from Last 3 Encounters:  09/26/23 5' 11.5" (1.816 m)  09/19/23 5' 11.75" (1.822 m)  03/28/23 5\' 11"  (1.803 m)     Physical Exam Vitals and nursing note reviewed.  Constitutional:      General: He is not in acute distress.    Appearance: Normal appearance. He is well-developed. He is not ill-appearing.  HENT:     Head: Normocephalic and atraumatic.     Right Ear: Hearing, tympanic membrane, ear canal and external ear normal.     Left Ear: Hearing, tympanic membrane, ear canal and external ear normal.     Mouth/Throat:     Mouth: Mucous membranes are moist.     Pharynx: Oropharynx is clear. No oropharyngeal exudate or posterior oropharyngeal erythema.  Eyes:     General: No scleral icterus.    Extraocular Movements: Extraocular movements intact.     Conjunctiva/sclera: Conjunctivae normal.     Pupils: Pupils are equal, round, and reactive to light.  Neck:     Thyroid : No thyroid  mass or thyromegaly.     Vascular: No carotid bruit.  Cardiovascular:     Rate and Rhythm: Normal rate and regular rhythm.     Pulses: Normal pulses.          Radial pulses are 2+ on the right side and 2+ on the left side.     Heart sounds: Murmur (3/6 systolic known AV sclerosis) heard.  Pulmonary:     Effort: Pulmonary effort is  normal. No respiratory distress.     Breath sounds: Normal breath sounds. No wheezing, rhonchi or rales.  Abdominal:     General: Bowel sounds are normal. There is no distension.     Palpations: Abdomen is soft. There is no mass.     Tenderness: There is no abdominal tenderness. There is no guarding or rebound.     Hernia: A hernia is present. Hernia is present in the left inguinal area. There is no hernia  in the right inguinal area.     Comments: Bulge present to left groin, nontender  Musculoskeletal:        General: Normal range of motion.     Cervical back: Normal range of motion and neck supple.     Right lower leg: No edema.     Left lower leg: No edema.  Lymphadenopathy:     Cervical: No cervical adenopathy.  Skin:    General: Skin is warm and dry.     Findings: No rash.  Neurological:     General: No focal deficit present.     Mental Status: He is alert and oriented to person, place, and time.  Psychiatric:        Mood and Affect: Mood normal.        Behavior: Behavior normal.        Thought Content: Thought content normal.        Judgment: Judgment normal.       Results for orders placed or performed in visit on 09/20/23  Vitamin B12   Collection Time: 09/20/23  8:32 AM  Result Value Ref Range   Vitamin B-12 333 211 - 911 pg/mL  CBC with Differential/Platelet   Collection Time: 09/20/23  8:32 AM  Result Value Ref Range   WBC 5.8 4.0 - 10.5 K/uL   RBC 4.00 (L) 4.22 - 5.81 Mil/uL   Hemoglobin 12.5 (L) 13.0 - 17.0 g/dL   HCT 16.1 (L) 09.6 - 04.5 %   MCV 93.8 78.0 - 100.0 fl   MCHC 33.4 30.0 - 36.0 g/dL   RDW 40.9 81.1 - 91.4 %   Platelets 194.0 150.0 - 400.0 K/uL   Neutrophils Relative % 54.9 43.0 - 77.0 %   Lymphocytes Relative 27.6 12.0 - 46.0 %   Monocytes Relative 12.9 (H) 3.0 - 12.0 %   Eosinophils Relative 4.0 0.0 - 5.0 %   Basophils Relative 0.6 0.0 - 3.0 %   Neutro Abs 3.2 1.4 - 7.7 K/uL   Lymphs Abs 1.6 0.7 - 4.0 K/uL   Monocytes Absolute 0.8 0.1 -  1.0 K/uL   Eosinophils Absolute 0.2 0.0 - 0.7 K/uL   Basophils Absolute 0.0 0.0 - 0.1 K/uL  PSA, Medicare   Collection Time: 09/20/23  8:32 AM  Result Value Ref Range   PSA 1.60 0.10 - 4.00 ng/ml  Hemoglobin A1c   Collection Time: 09/20/23  8:32 AM  Result Value Ref Range   Hgb A1c MFr Bld 6.5 4.6 - 6.5 %  Uric acid   Collection Time: 09/20/23  8:32 AM  Result Value Ref Range   Uric Acid, Serum 6.0 4.0 - 7.8 mg/dL  Comprehensive metabolic panel   Collection Time: 09/20/23  8:32 AM  Result Value Ref Range   Sodium 137 135 - 145 mEq/L   Potassium 5.1 3.5 - 5.1 mEq/L   Chloride 103 96 - 112 mEq/L   CO2 26 19 - 32 mEq/L   Glucose, Bld 122 (H) 70 - 99 mg/dL   BUN 27 (H) 6 - 23 mg/dL   Creatinine, Ser 7.82 0.40 - 1.50 mg/dL   Total Bilirubin 0.5 0.2 - 1.2 mg/dL   Alkaline Phosphatase 72 39 - 117 U/L   AST 24 0 - 37 U/L   ALT 24 0 - 53 U/L   Total Protein 6.5 6.0 - 8.3 g/dL   Albumin  4.1 3.5 - 5.2 g/dL   GFR 95.62 >13.08 mL/min   Calcium 8.7 8.4 - 10.5 mg/dL  Lipid panel   Collection Time: 09/20/23  8:32 AM  Result Value Ref Range   Cholesterol 148 0 - 200 mg/dL   Triglycerides 811.9 (H) 0.0 - 149.0 mg/dL   HDL 14.78 (L) >29.56 mg/dL   VLDL 21.3 0.0 - 08.6 mg/dL   LDL Cholesterol 83 0 - 99 mg/dL   Total CHOL/HDL Ratio 5    NonHDL 117.92       09/19/2023    9:08 AM 01/12/2023    4:09 PM 09/21/2022    9:34 AM 09/15/2022    9:55 AM 09/14/2021    9:09 AM  Depression screen PHQ 2/9  Decreased Interest 0 1 0 0 0  Down, Depressed, Hopeless 0 1 0 0 0  PHQ - 2 Score 0 2 0 0 0  Altered sleeping  2  0   Tired, decreased energy  0  0   Change in appetite  0  0   Feeling bad or failure about yourself   0  0   Trouble concentrating  0  0   Moving slowly or fidgety/restless  0  0   Suicidal thoughts  0  0   PHQ-9 Score  4  0   Difficult doing work/chores  Not difficult at all  Not difficult at all        01/12/2023    4:09 PM 09/21/2022    9:34 AM  GAD 7 : Generalized Anxiety  Score  Nervous, Anxious, on Edge 0 0  Control/stop worrying 0 0  Worry too much - different things 0 0  Trouble relaxing 0 0  Restless 0 0  Easily annoyed or irritable 0 0  Afraid - awful might happen 0 0  Total GAD 7 Score 0 0   Assessment & Plan:   Problem List Items Addressed This Visit     Essential hypertension (Chronic)   Chronic, stable. Continue current regimen.       Relevant Medications   lisinopril  (ZESTRIL ) 5 MG tablet   Hyperlipidemia with target LDL less than 100 (Chronic)   Chronic off statin. Reviewed diet choices to improve cholesterol levels. High ASCVD risk - if diabetes persists will need to discuss starting statin.  The 10-year ASCVD risk score (Arnett DK, et al., 2019) is: 51.5%   Values used to calculate the score:     Age: 1 years     Sex: Male     Is Non-Hispanic African American: No     Diabetic: Yes     Tobacco smoker: No     Systolic Blood Pressure: 136 mmHg     Is BP treated: Yes     HDL Cholesterol: 29.9 mg/dL     Total Cholesterol: 148 mg/dL       Relevant Medications   lisinopril  (ZESTRIL ) 5 MG tablet   Bifascicular block (Chronic)   Continue yearly cardiology f/u       Relevant Medications   lisinopril  (ZESTRIL ) 5 MG tablet   Ascending aorta dilatation (HCC) (Chronic)   Followed by cardiology      Relevant Medications   lisinopril  (ZESTRIL ) 5 MG tablet   Advanced directives, counseling/discussion - Primary (Chronic)   Previously discussed      Gout   Chronic, stable period on allopurinol  without recent gout flare.       Type 2 diabetes mellitus with other specified complication (HCC)   Encourage limiting added sugar, night time snacking, working on weight loss.  RTC 6 mo DM f/u visit  Relevant Medications   lisinopril  (ZESTRIL ) 5 MG tablet   Obesity, Class I, BMI 30-34.9   Encouraged healthy diet and lifestyle choices to affect sustainable weight loss.       OSA (obstructive sleep apnea)   Continue nightly  CPAP       Anemia   Persists, improving. Discussed adding some otc b12 to weekly regimen .      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  Discussed limiting straining activities.  Will refer to gen surg for further evaluation.       Relevant Orders   Ambulatory referral to General Surgery   Stressful life event affecting family   Support provided        Meds ordered this encounter  Medications   allopurinol  (ZYLOPRIM ) 300 MG tablet    Sig: Take 1 tablet (300 mg total) by mouth daily.    Dispense:  90 tablet    Refill:  4   gabapentin  (NEURONTIN ) 300 MG capsule    Sig: Take 1 capsule (300 mg total) by mouth at bedtime.    Dispense:  90 capsule    Refill:  4   lisinopril  (ZESTRIL ) 5 MG tablet    Sig: Take 1 tablet (5 mg total) by mouth daily.    Dispense:  90 tablet    Refill:  4    Orders Placed This Encounter  Procedures   Ambulatory referral to General Surgery    Referral Priority:   Routine    Referral Type:   Surgical    Referral Reason:   Specialty Services Required    Requested Specialty:   General Surgery    Number of Visits Requested:   1    Patient Instructions  Good to see you today Work on decreased added sugar, sweetened beverages, watch portion sizes, exercise as tolerated and weight loss.  We will refer you to general surgeon for evaluation of left sided inguinal hernia.  Return in 6 months for diabetes check.   Follow up plan: Return in about 6 months (around 03/25/2024) for follow up visit.  Claire Crick, MD

## 2023-09-26 NOTE — Assessment & Plan Note (Signed)
 Continue yearly cardiology f/u

## 2023-09-26 NOTE — Assessment & Plan Note (Addendum)
 Chronic off statin. Reviewed diet choices to improve cholesterol levels. High ASCVD risk - if diabetes persists will need to discuss starting statin.  The 10-year ASCVD risk score (Arnett DK, et al., 2019) is: 51.5%   Values used to calculate the score:     Age: 72 years     Sex: Male     Is Non-Hispanic African American: No     Diabetic: Yes     Tobacco smoker: No     Systolic Blood Pressure: 136 mmHg     Is BP treated: Yes     HDL Cholesterol: 29.9 mg/dL     Total Cholesterol: 148 mg/dL

## 2023-09-26 NOTE — Assessment & Plan Note (Signed)
 Evidence of L sided inguinal hernia on exam, but without strangulation or obstruction.  Discussed limiting straining activities.  Will refer to gen surg for further evaluation.

## 2023-09-26 NOTE — Assessment & Plan Note (Addendum)
 Persists, improving. Discussed adding some otc b12 to weekly regimen .

## 2023-09-26 NOTE — Assessment & Plan Note (Addendum)
 Encourage limiting added sugar, night time snacking, working on weight loss.  RTC 6 mo DM f/u visit

## 2023-09-26 NOTE — Assessment & Plan Note (Signed)
 Chronic, stable. Continue current regimen.

## 2023-09-26 NOTE — Assessment & Plan Note (Signed)
 Previously discussed.

## 2023-10-12 DIAGNOSIS — K4090 Unilateral inguinal hernia, without obstruction or gangrene, not specified as recurrent: Secondary | ICD-10-CM | POA: Diagnosis not present

## 2023-12-19 ENCOUNTER — Ambulatory Visit: Payer: Self-pay

## 2023-12-19 MED ORDER — COLCHICINE 0.6 MG PO TABS: 0.6000 mg | ORAL_TABLET | Freq: Every day | ORAL | 1 refills | Status: DC | PRN

## 2023-12-19 NOTE — Telephone Encounter (Signed)
Spoke with pt relaying Dr. G's message.  Pt verbalizes understanding and expresses his thanks.  

## 2023-12-19 NOTE — Addendum Note (Signed)
 Addended by: Claire Crick on: 12/19/2023 01:59 PM   Modules accepted: Orders

## 2023-12-19 NOTE — Telephone Encounter (Signed)
 Plz notify colchicine  Rx sent to pharmacy. Schedule OV if not improving with colchicine  treatment for presumed gout.

## 2023-12-19 NOTE — Telephone Encounter (Signed)
 Copied from CRM [id number]. Topic: Clinical - Red Word Triage >> Dec 19, 2023 11:04 AM Shereese L wrote: [facility] that prompted transfer to Nurse Triage: Gout attack right wrist Sore, stiffness and weakness   Chief Complaint: Wrist pain Symptoms: Right wrist pain and stiffness  Frequency: Intermittent  Disposition: [] ED /[] Urgent Care (no appt availability in office) / [x] Appointment(In office/virtual)/ []  [facility]/ [] Home Care/ [x] Refused Recommended Disposition /[] [facility]/ []  Follow-up with PCP Additional Notes: Patient reports he began to experience right wrist pain 1 week ago. He states his pain went away but then started again yesterday. He states his pain is worsened when using his hand and that the pain is causing him difficulty gripping things well. Patient reports a history of gout and states he normally takes Colchicine  for it but hasn't had any in years.   Patient has declined an appointment and wanted to see fi Dr. Mariam Shingles would send in a prescription for Colchicine  without being seen. Please contact the patient with a response to his request. Patient instructed to call back for new or worsening symptoms. Patient verbalized understanding and agreement with this plan.    Reason for Disposition  [1] MODERATE pain (e.g., interferes with normal activities) AND [2] present > 3 days  Answer Assessment - Initial Assessment Questions 1. ONSET: "When did the pain start?"     Yesterday  2. LOCATION: "Where is the pain located?"     Right hand  3. PAIN: "How bad is the pain?" (Scale 1-10; or mild, moderate, severe)   - MILD (1-3): doesn't interfere with normal activities   - MODERATE (4-7): interferes with normal activities (e.g., work or school) or awakens from sleep   - SEVERE (8-10): excruciating pain, unable to use hand at all     Mild to moderate  4. WORK OR EXERCISE: "Has there been any recent work or exercise that involved this part (i.e., hand or  wrist) of the body?"     No 5. CAUSE: "What do you think is causing the pain?"     History of gout  6. AGGRAVATING FACTORS: "What makes the pain worse?" (e.g., using computer)     Using the hand worsens the pain  7. OTHER SYMPTOMS: "Do you have any other symptoms?" (e.g., neck pain, swelling, rash, numbness, fever)     No  Protocols used: Hand and Wrist Pain-A-AH

## 2023-12-20 ENCOUNTER — Telehealth: Payer: Self-pay

## 2023-12-20 MED ORDER — COLCHICINE 0.6 MG PO TABS: 0.6000 mg | ORAL_TABLET | Freq: Every day | ORAL | 1 refills | Status: DC | PRN

## 2023-12-20 NOTE — Telephone Encounter (Signed)
 After looking at the chart, the rx was put in as Phone In so it was not sent to the pharmacy. I have sent that to [facility].

## 2023-12-20 NOTE — Telephone Encounter (Signed)
 Copied from CRM [id number]. Topic: Clinical - Prescription Issue >> Dec 20, 2023 10:32 AM Dewanda Foots wrote: Reason for CRM: Pt states that the pharmacy says they did not get the RX for his colchicine  0.6 MG tablet. When looking, I see that it was sent over yesterday (5/5). Instructed patient to call the pharmacy again and make sure it is there, because everything looks to be completed here on our end and to give us  a call if there is any problem. >> Dec 20, 2023 12:18 PM Allyne Areola wrote: [facility] to [facility] and they have informed him that they have not received it. He would like to know if there is a way to resend the prescription or call it in and give the verbal order.

## 2023-12-20 NOTE — Telephone Encounter (Signed)
 Copied from CRM [id number]. Topic: Clinical - Prescription Issue >> Dec 20, 2023 10:32 AM Dewanda Foots wrote: Reason for CRM: Pt states that the pharmacy says they did not get the RX for his colchicine  0.6 MG tablet. When looking, I see that it was sent over yesterday (5/5). Instructed patient to call the pharmacy again and make sure it is there, because everything looks to be completed here on our end and to give us  a call if there is any problem.

## 2024-01-06 DIAGNOSIS — M546 Pain in thoracic spine: Secondary | ICD-10-CM | POA: Diagnosis not present

## 2024-01-06 DIAGNOSIS — M545 Low back pain, unspecified: Secondary | ICD-10-CM | POA: Diagnosis not present

## 2024-01-11 ENCOUNTER — Other Ambulatory Visit: Payer: Self-pay | Admitting: Family Medicine

## 2024-01-11 NOTE — Telephone Encounter (Signed)
 Message from pharmacy:  REQUEST FOR 90 DAYS PRESCRIPTION.   Colchicine  Last filled:  12/20/23, #30 Last OV:  09/26/23, AWV Next OV:  03/26/24, 6 mo DM f/u

## 2024-01-16 DIAGNOSIS — M47896 Other spondylosis, lumbar region: Secondary | ICD-10-CM | POA: Diagnosis not present

## 2024-01-16 DIAGNOSIS — M6281 Muscle weakness (generalized): Secondary | ICD-10-CM | POA: Diagnosis not present

## 2024-01-18 ENCOUNTER — Encounter: Payer: Self-pay | Admitting: Cardiology

## 2024-02-03 DIAGNOSIS — M47896 Other spondylosis, lumbar region: Secondary | ICD-10-CM | POA: Diagnosis not present

## 2024-02-03 DIAGNOSIS — M6281 Muscle weakness (generalized): Secondary | ICD-10-CM | POA: Diagnosis not present

## 2024-02-14 DIAGNOSIS — M6281 Muscle weakness (generalized): Secondary | ICD-10-CM | POA: Diagnosis not present

## 2024-02-14 DIAGNOSIS — M47896 Other spondylosis, lumbar region: Secondary | ICD-10-CM | POA: Diagnosis not present

## 2024-02-24 DIAGNOSIS — M47896 Other spondylosis, lumbar region: Secondary | ICD-10-CM | POA: Diagnosis not present

## 2024-02-24 DIAGNOSIS — M6281 Muscle weakness (generalized): Secondary | ICD-10-CM | POA: Diagnosis not present

## 2024-02-27 DIAGNOSIS — M6281 Muscle weakness (generalized): Secondary | ICD-10-CM | POA: Diagnosis not present

## 2024-02-27 DIAGNOSIS — M47896 Other spondylosis, lumbar region: Secondary | ICD-10-CM | POA: Diagnosis not present

## 2024-03-02 DIAGNOSIS — M6281 Muscle weakness (generalized): Secondary | ICD-10-CM | POA: Diagnosis not present

## 2024-03-02 DIAGNOSIS — M47896 Other spondylosis, lumbar region: Secondary | ICD-10-CM | POA: Diagnosis not present

## 2024-03-08 DIAGNOSIS — M47896 Other spondylosis, lumbar region: Secondary | ICD-10-CM | POA: Diagnosis not present

## 2024-03-08 DIAGNOSIS — M6281 Muscle weakness (generalized): Secondary | ICD-10-CM | POA: Diagnosis not present

## 2024-03-13 DIAGNOSIS — M6281 Muscle weakness (generalized): Secondary | ICD-10-CM | POA: Diagnosis not present

## 2024-03-13 DIAGNOSIS — M47896 Other spondylosis, lumbar region: Secondary | ICD-10-CM | POA: Diagnosis not present

## 2024-03-15 DIAGNOSIS — M6281 Muscle weakness (generalized): Secondary | ICD-10-CM | POA: Diagnosis not present

## 2024-03-15 DIAGNOSIS — M47896 Other spondylosis, lumbar region: Secondary | ICD-10-CM | POA: Diagnosis not present

## 2024-03-26 ENCOUNTER — Encounter: Payer: Self-pay | Admitting: Nurse Practitioner

## 2024-03-26 ENCOUNTER — Ambulatory Visit: Payer: Self-pay | Admitting: Family Medicine

## 2024-03-26 ENCOUNTER — Telehealth: Payer: Self-pay | Admitting: Sleep Medicine

## 2024-03-26 ENCOUNTER — Ambulatory Visit (INDEPENDENT_AMBULATORY_CARE_PROVIDER_SITE_OTHER): Payer: Medicare Other | Admitting: Family Medicine

## 2024-03-26 ENCOUNTER — Encounter: Payer: Self-pay | Admitting: Family Medicine

## 2024-03-26 ENCOUNTER — Ambulatory Visit (INDEPENDENT_AMBULATORY_CARE_PROVIDER_SITE_OTHER): Admitting: Nurse Practitioner

## 2024-03-26 VITALS — BP 136/70 | HR 73 | Temp 98.0°F | Ht 71.5 in | Wt 222.0 lb

## 2024-03-26 VITALS — BP 132/68 | HR 62 | Temp 97.9°F | Ht 71.5 in | Wt 220.4 lb

## 2024-03-26 DIAGNOSIS — M150 Primary generalized (osteo)arthritis: Secondary | ICD-10-CM

## 2024-03-26 DIAGNOSIS — E538 Deficiency of other specified B group vitamins: Secondary | ICD-10-CM | POA: Insufficient documentation

## 2024-03-26 DIAGNOSIS — G4733 Obstructive sleep apnea (adult) (pediatric): Secondary | ICD-10-CM

## 2024-03-26 DIAGNOSIS — E1169 Type 2 diabetes mellitus with other specified complication: Secondary | ICD-10-CM | POA: Diagnosis not present

## 2024-03-26 DIAGNOSIS — Z6379 Other stressful life events affecting family and household: Secondary | ICD-10-CM

## 2024-03-26 LAB — VITAMIN B12: Vitamin B-12: 1473 pg/mL — ABNORMAL HIGH (ref 211–911)

## 2024-03-26 LAB — POCT GLYCOSYLATED HEMOGLOBIN (HGB A1C): Hemoglobin A1C: 6 % — AB (ref 4.0–5.6)

## 2024-03-26 LAB — CBC WITH DIFFERENTIAL/PLATELET
Basophils Absolute: 0 K/uL (ref 0.0–0.1)
Basophils Relative: 0.3 % (ref 0.0–3.0)
Eosinophils Absolute: 0.2 K/uL (ref 0.0–0.7)
Eosinophils Relative: 3.3 % (ref 0.0–5.0)
HCT: 38.6 % — ABNORMAL LOW (ref 39.0–52.0)
Hemoglobin: 13.1 g/dL (ref 13.0–17.0)
Lymphocytes Relative: 25.5 % (ref 12.0–46.0)
Lymphs Abs: 1.5 K/uL (ref 0.7–4.0)
MCHC: 33.9 g/dL (ref 30.0–36.0)
MCV: 90.1 fl (ref 78.0–100.0)
Monocytes Absolute: 0.6 K/uL (ref 0.1–1.0)
Monocytes Relative: 10.1 % (ref 3.0–12.0)
Neutro Abs: 3.6 K/uL (ref 1.4–7.7)
Neutrophils Relative %: 60.8 % (ref 43.0–77.0)
Platelets: 193 K/uL (ref 150.0–400.0)
RBC: 4.28 Mil/uL (ref 4.22–5.81)
RDW: 15.3 % (ref 11.5–15.5)
WBC: 5.9 K/uL (ref 4.0–10.5)

## 2024-03-26 MED ORDER — VITAMIN B-12 1000 MCG PO TABS: 1000.0000 ug | ORAL_TABLET | ORAL | Status: AC

## 2024-03-26 MED ORDER — VITAMIN B-12 1000 MCG PO TABS: 1000.0000 ug | ORAL_TABLET | Freq: Every day | ORAL | Status: DC

## 2024-03-26 NOTE — Assessment & Plan Note (Signed)
 Stressful family circumstances reviewed, support provided He doesn't think wife would want couples counseling. He may consider individual counseling - will let me know if desires referral.

## 2024-03-26 NOTE — Assessment & Plan Note (Signed)
 Chronic, improved A1c now in prediabetes range.  Congratulated on diet control to date.

## 2024-03-26 NOTE — Patient Instructions (Addendum)
 Labs today to check vitamin b12 levels We will refer you to [facility] pulmonology to establish care for sleep apnea on CPAP.  Let me know if/when interested in counseling.  We will request latest eye exam from Dr Zachary Pippins from [facility].  Return in  6 months for medicare wellness visit

## 2024-03-26 NOTE — Progress Notes (Addendum)
 Ph: [phone] Fax: [phone]   Patient ID: Ronald VEAR Paola Mickey., male    DOB: Mar 01, 1951, 73 y.o.   MRN: [id number]  This visit was conducted in person.  BP 132/68   Pulse 62   Temp 97.9 F (36.6 C) (Oral)   Ht 5' 11.5 (1.816 m)   Wt 220 lb 6 oz (100 kg)   SpO2 99%   BMI 30.31 kg/m    CC: 6 mo DM f/u visit  Subjective:   HPI: Ronald Tibbitts. is a 73 y.o. male presenting on 03/26/2024 for Medical Management of Chronic Issues ( DM f/u)   Ongoing family stressors - marital relationship, 2 daughters going through contentious divorces.   DM - does not regularly check sugars. Compliant with antihyperglycemic regimen which includes: diet controlled. Denies low sugars or hypoglycemic symptoms. Denies paresthesias, blurry vision. Last diabetic eye exam 2024 - DUE. Glucometer brand: doesn't have one. Last foot exam: DUE. DSME: has not had, declines. Needs to have cataract surgery.  Lab Results  Component Value Date   HGBA1C 6.0 (A) 03/26/2024   Diabetic Foot Exam - Simple   Simple Foot Form Diabetic Foot exam was performed with the following findings: Yes 03/26/2024  9:22 AM  Visual Inspection No deformities, no ulcerations, no other skin breakdown bilaterally: Yes Sensation Testing See comments: Yes Pulse Check Posterior Tibialis and Dorsalis pulse intact bilaterally: Yes Comments No claudication Diminished sensation to soles on monofilament testing    No results found for: MICROALBUR, [id number]   Notes increased fatigue/weakness over the past several months.  Known spinal stenosis on gabapentin  300mg  nightly, s/p lower lumbar surgery 2024. Recently saw Dr Beuford s/p course of physical therapy. Planning to join gym for strength training.   OSA on autoCPAP nightly followed by ENT Dr Jesus. DME supplier - [employer]. Asks to establish with new sleep doctor.   Lab Results  Component Value Date   VITAMINB12 333 09/20/2023       Relevant  past medical, surgical, family and social history reviewed and updated as indicated. Interim medical history since our last visit reviewed. Allergies and medications reviewed and updated. Outpatient Medications Prior to Visit  Medication Sig Dispense Refill   allopurinol  (ZYLOPRIM ) 300 MG tablet Take 1 tablet (300 mg total) by mouth daily. 90 tablet 4   colchicine  0.6 MG tablet TAKE 1 TABLET (0.6 MG TOTAL) BY MOUTH DAILY AS NEEDED (GOUT FLARE). 1ST DAY MAY TAKE 2 TABS (1.2MG ) 90 tablet 1   gabapentin  (NEURONTIN ) 300 MG capsule Take 1 capsule (300 mg total) by mouth at bedtime. 90 capsule 4   lisinopril  (ZESTRIL ) 5 MG tablet Take 1 tablet (5 mg total) by mouth daily. 90 tablet 4   Multiple Vitamins-Minerals (CENTRUM SILVER 50+MEN PO) Take 1 tablet by mouth daily.     Omega 3-6-9 Fatty Acids (OMEGA 3-6-9 PO) Take 1,000 mg by mouth in the morning and at bedtime.     Respiratory Therapy Supplies (CARETOUCH CPAP & BIPAP HOSE) MISC by Does not apply route.     No facility-administered medications prior to visit.     Per HPI unless specifically indicated in ROS section below Review of Systems  Objective:  BP 132/68   Pulse 62   Temp 97.9 F (36.6 C) (Oral)   Ht 5' 11.5 (1.816 m)   Wt 220 lb 6 oz (100 kg)   SpO2 99%   BMI 30.31 kg/m   Wt Readings from Last 3 Encounters:  03/26/24 220  lb 6 oz (100 kg)  09/26/23 226 lb 2 oz (102.6 kg)  09/19/23 210 lb (95.3 kg)      Physical Exam Vitals and nursing note reviewed.  Constitutional:      Appearance: Normal appearance. He is not ill-appearing.  HENT:     Mouth/Throat:     Mouth: Mucous membranes are moist.     Pharynx: Oropharynx is clear. No oropharyngeal exudate or posterior oropharyngeal erythema.  Eyes:     Extraocular Movements: Extraocular movements intact.     Conjunctiva/sclera: Conjunctivae normal.     Pupils: Pupils are equal, round, and reactive to light.  Cardiovascular:     Rate and Rhythm: Normal rate and regular  rhythm.     Pulses: Normal pulses.     Heart sounds: Normal heart sounds. No murmur heard. Pulmonary:     Effort: Pulmonary effort is normal. No respiratory distress.     Breath sounds: Normal breath sounds. No wheezing, rhonchi or rales.  Musculoskeletal:     Right lower leg: No edema.     Left lower leg: No edema.     Comments: See HPI for foot exam if done  Skin:    General: Skin is warm and dry.     Findings: No rash.  Neurological:     Mental Status: He is alert.  Psychiatric:        Mood and Affect: Mood normal.        Behavior: Behavior normal.       Results for orders placed or performed in visit on 03/26/24  POCT glycosylated hemoglobin (Hb A1C)   Collection Time: 03/26/24  9:07 AM  Result Value Ref Range   Hemoglobin A1C 6.0 (A) 4.0 - 5.6 %   HbA1c POC (<> result, manual entry)     HbA1c, POC (prediabetic range)     HbA1c, POC (controlled diabetic range)        03/26/2024    9:11 AM 09/19/2023    9:08 AM 01/12/2023    4:09 PM 09/21/2022    9:34 AM 09/15/2022    9:55 AM  Depression screen PHQ 2/9  Decreased Interest 0 0 1 0 0  Down, Depressed, Hopeless 0 0 1 0 0  PHQ - 2 Score 0 0 2 0 0  Altered sleeping 0  2  0  Tired, decreased energy 1  0  0  Change in appetite 0  0  0  Feeling bad or failure about yourself  0  0  0  Trouble concentrating 0  0  0  Moving slowly or fidgety/restless 0  0  0  Suicidal thoughts 0  0  0  PHQ-9 Score 1  4  0  Difficult doing work/chores Not difficult at all  Not difficult at all  Not difficult at all       03/26/2024    9:11 AM 01/12/2023    4:09 PM 09/21/2022    9:34 AM  GAD 7 : Generalized Anxiety Score  Nervous, Anxious, on Edge 0 0 0  Control/stop worrying 0 0 0  Worry too much - different things 0 0 0  Trouble relaxing 0 0 0  Restless 0 0 0  Easily annoyed or irritable 0 0 0  Afraid - awful might happen 0 0 0  Total GAD 7 Score 0 0 0  Anxiety Difficulty Not difficult at all     Assessment & Plan:   Problem List  Items Addressed This Visit  Type 2 diabetes mellitus with other specified complication (HCC) - Primary   Chronic, improved A1c now in prediabetes range.  Congratulated on diet control to date.       Relevant Orders   POCT glycosylated hemoglobin (Hb A1C) (Completed)   Vitamin B12   Osteoarthritis, multiple sites   OSA (obstructive sleep apnea)   Refer to pulmonology to establish for OSA.      Relevant Orders   Ambulatory referral to Pulmonology   Stressful life event affecting family   Stressful family circumstances reviewed, support provided He doesn't think wife would want couples counseling. He may consider individual counseling - will let me know if desires referral.       Low serum vitamin B12   Update levels on daily oral replacement      Relevant Orders   CBC with Differential/Platelet     Meds ordered this encounter  Medications   cyanocobalamin  (VITAMIN B12) 1000 MCG tablet    Sig: Take 1 tablet (1,000 mcg total) by mouth daily.    Orders Placed This Encounter  Procedures   Vitamin B12   CBC with Differential/Platelet   Ambulatory referral to Pulmonology    Referral Priority:   Routine    Referral Type:   Consultation    Referral Reason:   Specialty Services Required    Requested Specialty:   Pulmonary Disease    Number of Visits Requested:   1   POCT glycosylated hemoglobin (Hb A1C)    Patient Instructions  Labs today to check vitamin b12 levels We will refer [facility] to [facility] pulmonology to establish care for sleep apnea on CPAP.  Let me know if/when interested in counseling.  We will request latest eye exam from Dr Zachary Pippins from [facility].  Return in  6 months for medicare wellness visit   Follow up plan: Return in about 6 months (around 09/26/2024), or if symptoms worsen or fail to improve, for medicare wellness visit.  Anton Blas, MD

## 2024-03-26 NOTE — Progress Notes (Signed)
 @Patient  ID: Ronald Bullock Paola Mickey., male    DOB: August 31, 1950, 73 y.o.   MRN: [id number]  Chief Complaint  Patient presents with   Consult    Wearing CPAP nightly. No problems with mask or pressure.     Referring provider: Rilla Baller, MD  HPI: 73 year old male, never smoker referred for sleep consult. Past medical history significant for HTN, OSA, DM, obesity, HLD, anemia, gout.  TEST/EVENTS:   03/26/2024: Today - sleep consult Discussed the use of AI scribe software for clinical note transcription with the patient, who gave verbal consent to proceed.  History of Present Illness Ronald Bullock. is a 73 year old male with sleep apnea who presents to establish care.  He has a long-standing history of sleep apnea and has been using CPAP therapy for approximately 10-15 years. His initial sleep study was conducted a couple of years before 2014 he thinks, and he began CPAP therapy immediately after diagnosis. He has not had a repeat sleep study since then. His current CPAP machine, obtained around 2014-2015, is about 75 years old. This is his second machine. He maintains the machine by replacing hoses, water chambers, and filters, and uses a SoClean machine weekly for additional cleaning. He changes his mask monthly and uses distilled water in the humidifier. He sleeps well with the CPAP, achieving about eight hours of sleep nightly, and notes improved energy levels compared to before starting CPAP therapy.  He takes gabapentin  300 mg nightly for stinging and burning sensations, which have improved since his back surgery. No restless leg syndrome, and he does not wake up due to stinging or burning sensations.  He has a cardiac history that includes an echocardiogram revealing two leaky valves, as he recalls being told by his cardiologist. He has a history of anxiety and pressure-related issues, which were evaluated with an EKG and stress test. Last echo was unremarkable with  trivial mitral regurgitation. He is scheduled for a follow-up echocardiogram in October.  He goes to bed around midnight. Falls asleep within 15 minutes. Doesn't typically wake up. Gets up around 730-8 am. He's retired. No supplemental oxygen use.   No alcohol intake. No other sedative medications aside from the gabapentin . No excessive caffeine intake. Lives with his wife.   Epworth 2  02/25/2024-03/25/2024: CPAP 5-18 cmH2O 29/30 days; 93% >4 hr; average use 8 hr 7 min Pressure 95th 15.5 Leaks 95th 1.3 AHI 1.7    Allergies  Allergen Reactions   Ibuprofen     Weight gain   Meloxicam  Other (See Comments)    Peripheral edema, weight gain   Omeprazole  Diarrhea and Nausea And Vomiting    Vomiting/diarrhea on BID dosing    Immunization History  Administered Date(s) Administered   Fluad Quad(high Dose 65+) 09/16/2020, 09/16/2021, 05/31/2022   Influenza, High Dose Seasonal PF 06/16/2018, 08/03/2019, 05/11/2023   PFIZER(Purple Top)SARS-COV-2 Vaccination 09/27/2019, 10/18/2019, 06/13/2020, 03/12/2021   PNEUMOCOCCAL CONJUGATE-20 09/29/2021   Pfizer(Comirnaty)Fall Seasonal Vaccine 12 years and older 05/31/2022, 06/29/2023   Pneumococcal Conjugate-13 02/05/2020   Td 07/16/2001, 11/18/2008   Tdap 03/03/2014   Zoster Recombinant(Shingrix) 03/10/2020, 07/01/2020    Past Medical History:  Diagnosis Date   Arthritis    L end stage knee DKD, mild R knee DJD, R hip end stage DJD   COVID-19 virus infection 03/28/2023   DDD (degenerative disc disease), cervical    multilevel   Gout    rare flares   HLD (hyperlipidemia)    Low HDL  Hyperglycemia    Hypertension    Lumbar spinal stenosis 11/15/2014   severe central canal stenosis L3/4;L4/5 bilat facet hypertrophy, annular disc bulging, grade 1 sphondylolisthesis (Dumonski) planned PT and ESI (Wang)   Lumbar spinal stenosis    s/p lumbar decompressive surgery 11/2021 (Dumonski)   OSA on CPAP    Osteoarthritis    R knee and hip, s/p L  TKR   Pre-diabetes     Tobacco History: Social History   Tobacco Use  Smoking Status Never  Smokeless Tobacco Never   Counseling given: Not Answered   Outpatient Medications Prior to Visit  Medication Sig Dispense Refill   allopurinol  (ZYLOPRIM ) 300 MG tablet Take 1 tablet (300 mg total) by mouth daily. 90 tablet 4   cyanocobalamin  (VITAMIN B12) 1000 MCG tablet Take 1 tablet (1,000 mcg total) by mouth every Monday, Wednesday, and Friday.     gabapentin  (NEURONTIN ) 300 MG capsule Take 1 capsule (300 mg total) by mouth at bedtime. 90 capsule 4   lisinopril  (ZESTRIL ) 5 MG tablet Take 1 tablet (5 mg total) by mouth daily. 90 tablet 4   Multiple Vitamins-Minerals (CENTRUM SILVER 50+MEN PO) Take 1 tablet by mouth daily.     Omega 3-6-9 Fatty Acids (OMEGA 3-6-9 PO) Take 1,000 mg by mouth in the morning and at bedtime.     Respiratory Therapy Supplies (CARETOUCH CPAP & BIPAP HOSE) MISC by Does not apply route.     colchicine  0.6 MG tablet TAKE 1 TABLET (0.6 MG TOTAL) BY MOUTH DAILY AS NEEDED (GOUT FLARE). 1ST DAY MAY TAKE 2 TABS (1.2MG ) (Patient not taking: Reported on 03/26/2024) 90 tablet 1   No facility-administered medications prior to visit.     Review of Systems:   Constitutional: No weight loss or gain, night sweats, fatigue, HEENT: No headaches CV:  No chest pain, orthopnea, PND, palpitations Resp: +snoring (without CPAP) GU: No nocturia  Neuro: No memory impairment  Psych: No depression or anxiety. Mood stable.     Physical Exam:  BP 136/70 (BP Location: Left Arm, Patient Position: Sitting, Cuff Size: Normal)   Pulse 73   Temp 98 F (36.7 C) (Oral)   Ht 5' 11.5 (1.816 m)   Wt 222 lb (100.7 kg)   SpO2 98%   BMI 30.53 kg/m   GEN: Pleasant, interactive, well-appearing; obese; in no acute distress HEENT:  Normocephalic and atraumatic. PERRLA. Sclera white. Nasal turbinates pink, moist and patent bilaterally. No rhinorrhea present. Oropharynx pink and moist, without  exudate or edema. No lesions, ulcerations, or postnasal drip. Mallampati II/III NECK:  Supple w/ fair ROM. No lymphadenopathy.   CV: RRR, no m/r/g, no peripheral edema. Pulses intact, +2 bilaterally. No cyanosis, pallor or clubbing. PULMONARY:  Unlabored, regular breathing. Clear bilaterally A&P w/o wheezes/rales/rhonchi. No accessory muscle use.  GI: BS present and normoactive. Soft, non-tender to palpation.  MSK: No erythema, warmth or tenderness. Cap refil <2 sec all extrem.  Neuro: A/Ox3. No focal deficits noted.   Skin: Warm, no lesions or rashe Psych: Normal affect and behavior. Judgement and thought content appropriate.     Lab Results:  CBC    Component Value Date/Time   WBC 5.9 03/26/2024 0938   RBC 4.28 03/26/2024 0938   HGB 13.1 03/26/2024 0938   HCT 38.6 (L) 03/26/2024 0938   PLT 193.0 03/26/2024 0938   MCV 90.1 03/26/2024 0938   MCV 91.3 06/24/2016 1445   MCH 29.9 01/06/2023 1126   MCHC 33.9 03/26/2024 0938   RDW 15.3 03/26/2024  9061   LYMPHSABS 1.5 03/26/2024 0938   MONOABS 0.6 03/26/2024 0938   EOSABS 0.2 03/26/2024 0938   BASOSABS 0.0 03/26/2024 0938    BMET    Component Value Date/Time   NA 137 09/20/2023 0832   K 5.1 09/20/2023 0832   CL 103 09/20/2023 0832   CO2 26 09/20/2023 0832   GLUCOSE 122 (H) 09/20/2023 0832   BUN 27 (H) 09/20/2023 0832   CREATININE 1.17 09/20/2023 0832   CALCIUM 8.7 09/20/2023 0832   GFRNONAA >60 01/06/2023 1126   GFRAA 53 (L) 03/28/2013 0640    BNP No results found for: BNP   Imaging:  No results found.  Administration History     None           No data to display          No results found for: NITRICOXIDE      Assessment & Plan:   OSA (obstructive sleep apnea) OSA on CPAP. Will request original sleep study records from [employer]. Excellent compliance and control. Receives benefit from use. Aware of proper care/use of device. Machine is quite old. Will place order for new CPAP machine. Healthy  weight management encouraged. Safe driving practices reviewed.   Patient Instructions  Continue to use CPAP every night, minimum of 4-6 hours a night.  Change equipment as directed. Wash your tubing with warm soap and water daily, hang to dry. Wash humidifier portion weekly. Use bottled, distilled water and change daily Be aware of reduced alertness and do not drive or operate heavy machinery if experiencing this or drowsiness.  Exercise encouraged, as tolerated. Healthy weight management discussed.  Avoid or decrease alcohol consumption and medications that make you more sleepy, if possible. Notify if persistent daytime sleepiness occurs even with consistent use of PAP therapy.  Change CPAP supplies... Every month Mask cushions and/or nasal pillows CPAP machine filters Every 3 months Mask frame (not including the headgear) CPAP tubing Every 6 months Mask headgear Chin strap (if applicable) Humidifier water tub  Try mask liners with the mask to see if it helps with the face irritation  Order placed for new CPAP machine 5-18 cmH2O, mask of choice and heated humidity   Follow up in 10-12 weeks with Izetta Kyrstan Gotwalt,NP, or sooner if needed    Advised if symptoms do not improve or worsen, to please contact office for sooner follow up or seek emergency care.   I spent 35 minutes of dedicated to the care of this patient on the date of this encounter to include pre-visit review of records, face-to-face time with the patient discussing conditions above, post visit ordering of testing, clinical documentation with the electronic health record, making appropriate referrals as documented, and communicating necessary findings to members of the patients care team.  Comer LULLA Rouleau, NP 03/27/2024  Pt aware and understands NP's role.

## 2024-03-26 NOTE — Assessment & Plan Note (Signed)
 Refer to pulmonology to establish for OSA.

## 2024-03-26 NOTE — Assessment & Plan Note (Addendum)
 Update levels on daily oral replacement

## 2024-03-26 NOTE — Patient Instructions (Addendum)
 Continue to use CPAP every night, minimum of 4-6 hours a night.  Change equipment as directed. Wash your tubing with warm soap and water daily, hang to dry. Wash humidifier portion weekly. Use bottled, distilled water and change daily Be aware of reduced alertness and do not drive or operate heavy machinery if experiencing this or drowsiness.  Exercise encouraged, as tolerated. Healthy weight management discussed.  Avoid or decrease alcohol consumption and medications that make you more sleepy, if possible. Notify if persistent daytime sleepiness occurs even with consistent use of PAP therapy.  Change CPAP supplies... Every month Mask cushions and/or nasal pillows CPAP machine filters Every 3 months Mask frame (not including the headgear) CPAP tubing Every 6 months Mask headgear Chin strap (if applicable) Humidifier water tub  Try mask liners with the mask to see if it helps with the face irritation  Order placed for new CPAP machine 5-18 cmH2O, mask of choice and heated humidity   Follow up in 10-12 weeks with Ronald Samella Lucchetti,NP, or sooner if needed

## 2024-03-26 NOTE — Telephone Encounter (Signed)
 LVMTCB to schedule sleep consult.

## 2024-03-27 ENCOUNTER — Encounter: Payer: Self-pay | Admitting: General Practice

## 2024-03-27 ENCOUNTER — Ambulatory Visit: Attending: General Practice | Admitting: General Practice

## 2024-03-27 ENCOUNTER — Encounter: Payer: Self-pay | Admitting: Nurse Practitioner

## 2024-03-27 VITALS — BP 130/68 | Ht 71.5 in | Wt 224.8 lb

## 2024-03-27 DIAGNOSIS — E785 Hyperlipidemia, unspecified: Secondary | ICD-10-CM | POA: Insufficient documentation

## 2024-03-27 DIAGNOSIS — I358 Other nonrheumatic aortic valve disorders: Secondary | ICD-10-CM | POA: Insufficient documentation

## 2024-03-27 DIAGNOSIS — I77810 Thoracic aortic ectasia: Secondary | ICD-10-CM | POA: Insufficient documentation

## 2024-03-27 DIAGNOSIS — I452 Bifascicular block: Secondary | ICD-10-CM | POA: Diagnosis not present

## 2024-03-27 NOTE — Progress Notes (Signed)
 Cardiology Clinic Note   Patient Name: Ronald Bullock. Date of Encounter: 03/27/2024  Primary Care Provider:  Rilla Baller, MD Primary Cardiologist:  Alm Clay, MD  Patient Profile    Ronald Bullock. 73 year old male presents the clinic today for follow-up evaluation of his ascending aortic dilation and aortic valve sclerosis.  Past Medical History    Past Medical History:  Diagnosis Date   Arthritis    L end stage knee DKD, mild R knee DJD, R hip end stage DJD   COVID-19 virus infection 03/28/2023   DDD (degenerative disc disease), cervical    multilevel   Gout    rare flares   HLD (hyperlipidemia)    Low HDL   Hyperglycemia    Hypertension    Lumbar spinal stenosis 11/15/2014   severe central canal stenosis L3/4;L4/5 bilat facet hypertrophy, annular disc bulging, grade 1 sphondylolisthesis (Dumonski) planned PT and ESI Thana)   Lumbar spinal stenosis    s/p lumbar decompressive surgery 11/2021 (Dumonski)   OSA on CPAP    Osteoarthritis    R knee and hip, s/p L TKR   Pre-diabetes    Past Surgical History:  Procedure Laterality Date   cardiolyte  1997   WNL   carotid US   1997   WNL   CATARACT EXTRACTION W/PHACO Left 12/24/2014   Procedure: CATARACT EXTRACTION PHACO AND INTRAOCULAR LENS PLACEMENT (IOC);  Surgeon: Ronald Carmine, MD; US  01:16AP% 26.4Cde 20.17   COLONOSCOPY  1998   WNL   COLONOSCOPY  06/2010   WNL, internal hemorrhoids, rec rpt 10 yrs   COLONOSCOPY  07/2021   TAx3, HPx1, int hem, rpt 5 yrs Oletta)   CT HEAD LIMITED W/CM  1997   WNL   ESOPHAGOGASTRODUODENOSCOPY  1998   gastric polyps, benign   JOINT REPLACEMENT  1968   Left Knee, cartilage removed   KNEE ARTHROSCOPY  1968   cartilage removal   LUMBAR EPIDURAL INJECTION  02/2017   L3-4 interlaminar injection Thana)   MRI lumbar  2000   mild bulge L3/4, mild foraminal narrowing L4/5, L5/S2, small disk herniation   MRI lumbar  11/2014   severe L3/4, L4/5 central  canal stenosis (Dumonski)   SHOULDER SURGERY Right 05/2019   [facility]   TOTAL HIP ARTHROPLASTY Right 03/27/2013   TOTAL HIP ARTHROPLASTY ANTERIOR APPROACH;  Surgeon: Maude KANDICE Herald, MD   TOTAL KNEE ARTHROPLASTY Left 09/26/2012   TOTAL KNEE ARTHROPLASTY;  Surgeon: Maude KANDICE Herald, MD   TOTAL KNEE REVISION Left 11/30/2022   Procedure: LEFT TOTAL KNEE REVISION;  Surgeon: Herald Maude, MD;  Location: [facility];  Service: Orthopedics;  Laterality: Left;   TRANSFORAMINAL LUMBAR INTERBODY FUSION (TLIF) WITH PEDICLE SCREW FIXATION 2 LEVEL Left 12/09/2021   Procedure: LEFT-SIDED LUMBAR 3- LUMBAR 4, LUMBAR 4- LUMBAR 5 TRANSFORAMINAL INTERBODY FUSION AND DECOMPRESSION WITH INSTRUMENTATION AND ALLOGRAFT;  Surgeon: Beuford Anes, MD;  Location: [facility];  Service: Orthopedics;  Laterality: Left;   TRANSTHORACIC ECHOCARDIOGRAM  12/08/2021   Normal LV size and function.  EF 55-60 %.  No RWMA.  Mild asymmetric basal septal LVH.  Normal diastolic function.  Mild RV dilation with normal function.  Normal MV, mild AOV calcification-sclerosis but no stenosis.    Allergies  Allergies  Allergen Reactions   Ibuprofen     Weight gain   Meloxicam  Other (See Comments)    Peripheral edema, weight gain   Omeprazole  Diarrhea and Nausea And Vomiting    Vomiting/diarrhea on BID dosing  History of Present Illness    Ronald Bullock. has a PMH of HTN, bifascicular block, ascending aortic dilation, aortic sclerosis, OSA, burning mouth syndrome, ulnar neuropathy, left total knee replacement, hyperlipidemia, gout, obesity, prediabetes, dizziness, and anemia.  He underwent back surgery 4/23.  During that time his echocardiogram 12/08/2021 showed normal LV function and no regional wall motion abnormalities.  He was noted to have mild asymmetric basilar septal LVH and normal diastolic function.  Mild RV dilation with normal function was also noted.  He was seen in follow-up by Dr. Anner on 03/12/2022.  He continued  to do well from a cardiac standpoint at that time.  He was completing physical therapy.  He denied chest pain or shortness of breath.  His blood pressure was borderline.  His lisinopril  was continued.  It was felt that his knee pain was contributing to his elevated blood pressure.  He was contacted via virtual visit on 09/29/2022 for preoperative cardiac evaluation.  At that time he denied chest pain and shortness of breath.  He was able to complete 4 METS of physical activity.  He was planning to undergo left knee arthroplasty.  He presented to the clinic 03/15/23 for follow-up evaluation and stated he continued to be active at home.  He recently had a reaction to muscle relaxer medication and was evaluated in the emergency department.  He was found to be anemic as well.  He was seen in follow-up by his PCP who prescribed a course of iron therapy.  He tolerated p.o. iron well.  His hemoglobin recovered.  His blood pressure initially was 150/68 and on recheck was 128/58.  He did have some mild bilateral ankle edema.  He tolerated left knee revision of his previous/prior prosthesis.  I will continued his  medication, gave heart healthy diet information, had him continue his current physical activity and plan follow-up in 12 months.  He presents to the clinic today for follow-up evaluation and states he has been therapy to regain some of his upper body strength.  He has been doing this for 2 months.  He reports that he started to develop a left groin hernia.  He is being evaluated for this.  He also reports some increased stress related to his daughter going through a divorce.  We reviewed his previous echocardiogram and today's EKG.  He expressed understanding.  He remains stable from a cardiac standpoint.  I will repeat his echocardiogram in September and plan follow-up in 12 months.   Today he denies chest pain, shortness of breath,  fatigue, palpitations, melena, hematuria, hemoptysis, diaphoresis,  weakness, presyncope, syncope, orthopnea, and PND.    Home Medications    Prior to Admission medications   Medication Sig Start Date End Date Taking? Authorizing Provider  allopurinol  (ZYLOPRIM ) 300 MG tablet Take 1 tablet (300 mg total) by mouth daily. 09/21/22   Rilla Baller, MD  gabapentin  (NEURONTIN ) 300 MG capsule Take 1 capsule (300 mg total) by mouth at bedtime as needed (Nerve pan). 09/23/22   Rilla Baller, MD  lisinopril  (ZESTRIL ) 5 MG tablet Take 1 tablet (5 mg total) by mouth daily. 09/21/22   Rilla Baller, MD  Multiple Vitamins-Minerals (CENTRUM SILVER 50+MEN PO) Take 1 tablet by mouth daily.    [provider]  NON FORMULARY Pt uses a cpap nightly    [provider]  Omega 3-6-9 Fatty Acids (OMEGA 3-6-9 PO) Take 1,000 mg by mouth in the morning and at bedtime.    [provider]  Respiratory Therapy Supplies (CARETOUCH CPAP & BIPAP HOSE) MISC by Does not apply route.    [provider]    Family History    Family History  Problem Relation Age of Onset   Asthma Mother    Cancer Mother        Vaginal; radiation dz of bowel   Stroke Father 64   Diabetes Father    Obesity Sister    Esophageal cancer Neg Hx    Stomach cancer Neg Hx    Rectal cancer Neg Hx    Colon cancer Neg Hx    Heart attack Neg Hx    CAD Neg Hx    He indicated that his mother is deceased. He indicated that his father is deceased. He indicated that only one of his two sisters is alive. He indicated that the status of his neg hx is unknown.  Social History    Social History   Socioeconomic History   Marital status: Married    Spouse name: Daundre Biel   Number of children: 4   Years of education: college   Highest education level: Not on file  Occupational History   Occupation: Psychologist, sport and exercise Chem in [employer]  Tobacco Use   Smoking status: Never   Smokeless tobacco: Never  Vaping Use   Vaping status: Never Used   Substance and Sexual Activity   Alcohol use: Yes    Comment: Rare   Drug use: No   Sexual activity: Never  Other Topics Concern   Not on file  Social History Narrative   Lives with wife, has grown son   Activity: no regular exercise-presently limited by left knee pain for 1 prior surgery as well as significant lumbar spinal pain/stenosis with significant radiculopathy down bilateral legs.  Prior to this was ambulatory without any issues.   Diet: good water, some fish, good vegetables, red meat 3x/wk   Right-handed.   Two 32-ounce glasses of tea.   Social Drivers of Corporate investment banker Strain: Low Risk  (09/19/2023)   Overall Financial Resource Strain (CARDIA)    Difficulty of Paying Living Expenses: Not very hard  Food Insecurity: No Food Insecurity (09/19/2023)   Hunger Vital Sign    Worried About Running Out of Food in the Last Year: Never true    Ran Out of Food in the Last Year: Never true  Transportation Needs: No Transportation Needs (09/19/2023)   PRAPARE - Administrator, Civil Service (Medical): No    Lack of Transportation (Non-Medical): No  Physical Activity: Sufficiently Active (09/19/2023)   Exercise Vital Sign    Days of Exercise per Week: 3 days    Minutes of Exercise per Session: 60 min  Stress: No Stress Concern Present (09/19/2023)   [employer] of Occupational Health - Occupational Stress Questionnaire    Feeling of Stress : Only a little  Social Connections: Moderately Isolated (09/19/2023)   Social Connection and Isolation Panel    Frequency of Communication with Friends and Family: More than three times a week    Frequency of Social Gatherings with Friends and Family: More than three times a week    Attends Religious Services: Never    Database administrator or Organizations: No    Attends Banker Meetings: Never    Marital Status: Married  Catering manager Violence: Not At Risk (09/19/2023)   Humiliation, Afraid, Rape, and  Kick questionnaire    Fear of Current or  Ex-Partner: No    Emotionally Abused: No    Physically Abused: No    Sexually Abused: No     Review of Systems    General:  No chills, fever, night sweats or weight changes.  Cardiovascular:  No chest pain, dyspnea on exertion, edema, orthopnea, palpitations, paroxysmal nocturnal dyspnea. Dermatological: No rash, lesions/masses Respiratory: No cough, dyspnea Urologic: No hematuria, dysuria Abdominal:   No nausea, vomiting, diarrhea, bright red blood per rectum, melena, or hematemesis Neurologic:  No visual changes, wkns, changes in mental status. All other systems reviewed and are otherwise negative except as noted above.  Physical Exam    VS:  BP 130/68   Ht 5' 11.5 (1.816 m)   Wt 224 lb 12.8 oz (102 kg)   SpO2 98%   BMI 30.92 kg/m  , BMI Body mass index is 30.92 kg/m. GEN: Well nourished, well developed, in no acute distress. HEENT: normal. Neck: Supple, no JVD, carotid bruits, or masses. Cardiac: RRR, no murmurs, rubs, or gallops. No clubbing, cyanosis, trace bilateral ankle edema.  Radials/DP/PT 2+ and equal bilaterally.  Respiratory:  Respirations regular and unlabored, clear to auscultation bilaterally. GI: Soft, nontender, nondistended, BS + x 4. MS: no deformity or atrophy. Skin: warm and dry, no rash. Neuro:  Strength and sensation are intact. Psych: Normal affect.  Accessory Clinical Findings    Recent Labs: 09/20/2023: ALT 24; BUN 27; Creatinine, Ser 1.17; Potassium 5.1; Sodium 137 03/26/2024: Hemoglobin 13.1; Platelets 193.0   Recent Lipid Panel    Component Value Date/Time   CHOL 148 09/20/2023 0832   TRIG 177.0 (H) 09/20/2023 0832   HDL 29.90 (L) 09/20/2023 0832   CHOLHDL 5 09/20/2023 0832   VLDL 35.4 09/20/2023 0832   LDLCALC 83 09/20/2023 0832   LDLDIRECT 96.0 09/15/2022 0803         ECG personally reviewed by me today-EKG Interpretation Date/Time:  Tuesday March 27 2024 10:09:06 EDT Ventricular Rate:   61 PR Interval:  234 QRS Duration:  130 QT Interval:  428 QTC Calculation: 430 R Axis:   -39  Text Interpretation: Sinus rhythm with 1st degree A-V block Left axis deviation Left ventricular hypertrophy with QRS widening ( R in aVL , Cornell product ) When compared with ECG of 06-Jan-2023 11:22, (RBBB and left anterior fascicular block) is no longer Present Confirmed by Emelia Hazy [phone]) on 03/27/2024 10:13:47 AM    Echocardiogram 12/08/2021  IMPRESSIONS     1. Left ventricular ejection fraction, by estimation, is 55 to 60%. Left  ventricular ejection fraction by 3D volume is 56 %. The left ventricle has  normal function. The left ventricle has no regional wall motion  abnormalities. There is mild asymmetric  left ventricular hypertrophy of the basal-septal segment. Left ventricular  diastolic parameters were normal. The average left ventricular global  longitudinal strain is -21.5 %.   2. Right ventricular systolic function is normal. The right ventricular  size is mildly enlarged.   3. The mitral valve is normal in structure. Trivial mitral valve  regurgitation.   4. The aortic valve is tricuspid. Aortic valve regurgitation is not  visualized. Aortic valve sclerosis/calcification is present, without any  evidence of aortic stenosis.   5. Aortic dilatation noted. There is mild dilatation of the ascending  aorta, measuring 38 mm.   FINDINGS   Left Ventricle: Left ventricular ejection fraction, by estimation, is 55  to 60%. Left ventricular ejection fraction by 3D volume is 56 %. The left  ventricle has normal  function. The left ventricle has no regional wall  motion abnormalities. The average  left ventricular global longitudinal strain is -21.5 %. The left  ventricular internal cavity size was normal in size. There is mild  asymmetric left ventricular hypertrophy of the basal-septal segment. Left  ventricular diastolic parameters were normal.   Right Ventricle: The  right ventricular size is mildly enlarged. No  increase in right ventricular wall thickness. Right ventricular systolic  function is normal.   Left Atrium: Left atrial size was normal in size.   Right Atrium: Right atrial size was normal in size.   Pericardium: There is no evidence of pericardial effusion.   Mitral Valve: The mitral valve is normal in structure. Trivial mitral  valve regurgitation.   Tricuspid Valve: The tricuspid valve is normal in structure. Tricuspid  valve regurgitation is trivial.   Aortic Valve: The aortic valve is tricuspid. Aortic valve regurgitation is  not visualized. Aortic valve sclerosis/calcification is present, without  any evidence of aortic stenosis. Aortic valve mean gradient measures 7.0  mmHg. Aortic valve peak gradient  measures 13.1 mmHg. Aortic valve area, by VTI measures 2.66 cm.   Pulmonic Valve: The pulmonic valve was not well visualized. Pulmonic valve  regurgitation is mild.   Aorta: The aortic root is normal in size and structure and aortic  dilatation noted. There is mild dilatation of the ascending aorta,  measuring 38 mm.   IAS/Shunts: The interatrial septum was not well visualized.   Echocardiogram 04/26/2023  IMPRESSIONS     1. Left ventricular ejection fraction, by estimation, is 55 to 60%. The  left ventricle has normal function. The left ventricle has no regional  wall motion abnormalities. There is moderate asymmetric left ventricular  hypertrophy of the basal-septal  segment. Left ventricular diastolic parameters were normal. The average  left ventricular global longitudinal strain is -21.3 %.   2. Right ventricular systolic function is normal. The right ventricular  size is normal. There is normal pulmonary artery systolic pressure. The  estimated right ventricular systolic pressure is 21.3 mmHg.   3. The mitral valve is degenerative. Trivial mitral valve regurgitation.  No evidence of mitral stenosis. Moderate  mitral annular calcification.   4. The aortic valve is calcified. Aortic valve regurgitation is not  visualized. Aortic valve sclerosis/calcification is present, without any  evidence of aortic stenosis.   FINDINGS   Left Ventricle: Left ventricular ejection fraction, by estimation, is 55  to 60%. The left ventricle has normal function. The left ventricle has no  regional wall motion abnormalities. The average left ventricular global  longitudinal strain is -21.3 %.  The left ventricular internal cavity size was normal in size. There is  moderate asymmetric left ventricular hypertrophy of the basal-septal  segment. Left ventricular diastolic parameters were normal.   Right Ventricle: The right ventricular size is normal. No increase in  right ventricular wall thickness. Right ventricular systolic function is  normal. There is normal pulmonary artery systolic pressure. The tricuspid  regurgitant velocity is 2.14 m/s, and   with an assumed right atrial pressure of 3 mmHg, the estimated right  ventricular systolic pressure is 21.3 mmHg.   Left Atrium: Left atrial size was normal in size.   Right Atrium: Right atrial size was normal in size.   Pericardium: There is no evidence of pericardial effusion.   Mitral Valve: The mitral valve is degenerative in appearance. Moderate  mitral annular calcification. Trivial mitral valve regurgitation. No  evidence of mitral valve stenosis.  Tricuspid Valve: The tricuspid valve is normal in structure. Tricuspid  valve regurgitation is trivial.   Aortic Valve: The aortic valve is calcified. Aortic valve regurgitation is  not visualized. Aortic valve sclerosis/calcification is present, without  any evidence of aortic stenosis.   Pulmonic Valve: The pulmonic valve was not well visualized. Pulmonic valve  regurgitation is mild.   Aorta: The aortic root and ascending aorta are structurally normal, with  no evidence of dilitation.   IAS/Shunts:  The interatrial septum was not well visualized.    Assessment & Plan   1.  Bifascicular block-EKG today shows sinus rhythm first-degree AV block 61 bpm.  Continues to deny lightheadedness, presyncope or syncope.   He is not  a candidate for AV nodal blocking agents, calcium channel blockers, digoxin.  Previously did note improvement with floating type sensation after having Epley maneuvers. Continue to monitor  Essential hypertension-BP today 130/68. Maintain blood pressure log Heart healthy low-sodium diet-reviewed Continue lisinopril   Ascending aortic dilation-previously noted to be 38 mm.  Noted on echocardiogram 12/08/2021.  Follow-up echocardiogram on 1024 showed aortic root and ascending aorta structurally normal with no evidence of dilation. Maintain good blood pressure control-reviewed Repeat echocardiogram   Hyperlipidemia-LDL 83 on 09/20/23.  Goal less than 100 LDL. High-fiber diet Increase physical activity as tolerated Follows with PCP  Disposition: Follow-up with Dr. Anner or me in 12 months.   Josefa HERO. Rashana Andrew NP-C     03/27/2024, 10:39 AM [facility] [address] Office [phone] Fax [phone]    I spent 14 minutes examining this patient, reviewing medications, and using patient centered shared decision making involving her cardiac care.  Prior to her visit I spent greater than 20 minutes reviewing her past medical history,  medications, and prior cardiac tests.

## 2024-03-27 NOTE — Patient Instructions (Signed)
 Medication Instructions:  Your physician recommends that you continue on your current medications as directed. Please refer to the Current Medication list given to you today.  *If you need a refill on your cardiac medications before your next appointment, please call your pharmacy*  Lab Work: NONE If you have labs (blood work) drawn today and your tests are completely normal, you will receive your results only by: MyChart Message (if you have MyChart) OR A paper copy in the mail If you have any lab test that is abnormal or we need to change your treatment, we will call you to review the results.  Testing/Procedures: Your physician has requested that you have an echocardiogram. Echocardiography is a painless test that uses sound waves to create images of your heart. It provides your doctor with information about the size and shape of your heart and how well your heart's chambers and valves are working. This procedure takes approximately one hour. There are no restrictions for this procedure. Please do NOT wear cologne, perfume, aftershave, or lotions (deodorant is allowed). Please arrive 15 minutes prior to your appointment time.  Please note: We ask at that you not bring children with you during ultrasound (echo/ vascular) testing. Due to room size and safety concerns, children are not allowed in the ultrasound rooms during exams. Our front office staff cannot provide observation of children in our lobby area while testing is being conducted. An adult accompanying a patient to their appointment will only be allowed in the ultrasound room at the discretion of the ultrasound technician under special circumstances. We apologize for any inconvenience.   Follow-Up: At [facility], you and your health needs are our priority.  As part of our continuing mission to provide you with exceptional heart care, our providers are all part of one team.  This team includes your primary Cardiologist  (physician) and Advanced Practice Providers or APPs (Physician Assistants and Nurse Practitioners) who all work together to provide you with the care you need, when you need it.  Your next appointment:   1 year(s)  Provider:   Alm Clay, MD   We recommend signing up for the patient portal called MyChart.  Sign up information is provided on this After Visit Summary.  MyChart is used to connect with patients for Virtual Visits (Telemedicine).  Patients are able to view lab/test results, encounter notes, upcoming appointments, etc.  Non-urgent messages can be sent to your provider as well.   To learn more about what you can do with MyChart, go to [email or web address].

## 2024-03-27 NOTE — Assessment & Plan Note (Addendum)
 OSA on CPAP. Will request original sleep study records from [facility]. Excellent compliance and control. Receives benefit from use. Aware of proper care/use of device. Machine is quite old. Will place order for new CPAP machine. Healthy weight management encouraged. Safe driving practices reviewed.   Patient Instructions  Continue to use CPAP every night, minimum of 4-6 hours a night.  Change equipment as directed. Wash your tubing with warm soap and water daily, hang to dry. Wash humidifier portion weekly. Use bottled, distilled water and change daily Be aware of reduced alertness and do not drive or operate heavy machinery if experiencing this or drowsiness.  Exercise encouraged, as tolerated. Healthy weight management discussed.  Avoid or decrease alcohol consumption and medications that make you more sleepy, if possible. Notify if persistent daytime sleepiness occurs even with consistent use of PAP therapy.  Change CPAP supplies... Every month Mask cushions and/or nasal pillows CPAP machine filters Every 3 months Mask frame (not including the headgear) CPAP tubing Every 6 months Mask headgear Chin strap (if applicable) Humidifier water tub  Try mask liners with the mask to see if it helps with the face irritation  Order placed for new CPAP machine 5-18 cmH2O, mask of choice and heated humidity   Follow up in 10-12 weeks with Ronald Mivaan Corbitt,NP, or sooner if needed

## 2024-05-02 ENCOUNTER — Ambulatory Visit (HOSPITAL_COMMUNITY)
Admission: RE | Admit: 2024-05-02 | Discharge: 2024-05-02 | Disposition: A | Discharge status: Home / Self Care | Source: Ambulatory Visit | Attending: Cardiovascular Disease | Admitting: Cardiovascular Disease

## 2024-05-02 DIAGNOSIS — I452 Bifascicular block: Secondary | ICD-10-CM | POA: Insufficient documentation

## 2024-05-02 DIAGNOSIS — I77810 Thoracic aortic ectasia: Secondary | ICD-10-CM | POA: Diagnosis not present

## 2024-05-02 DIAGNOSIS — I358 Other nonrheumatic aortic valve disorders: Secondary | ICD-10-CM | POA: Diagnosis not present

## 2024-05-02 LAB — ECHOCARDIOGRAM COMPLETE
AR max vel: 2 cm2
AV Area VTI: 2.09 cm2
AV Area mean vel: 1.95 cm2
AV Mean grad: 9 mmHg
AV Peak grad: 17.1 mmHg
Ao pk vel: 2.07 m/s
Area-P 1/2: 3 cm2
S' Lateral: 3.2 cm

## 2024-05-03 ENCOUNTER — Ambulatory Visit: Payer: Self-pay | Admitting: General Practice

## 2024-05-04 NOTE — Telephone Encounter (Signed)
Pt is calling to get Echo results.

## 2024-05-16 ENCOUNTER — Ambulatory Visit: Admitting: General Practice

## 2024-06-18 ENCOUNTER — Ambulatory Visit: Admitting: Nurse Practitioner

## 2024-06-19 DIAGNOSIS — R531 Weakness: Secondary | ICD-10-CM | POA: Diagnosis not present

## 2024-06-19 DIAGNOSIS — M25662 Stiffness of left knee, not elsewhere classified: Secondary | ICD-10-CM | POA: Diagnosis not present

## 2024-06-19 DIAGNOSIS — Z96652 Presence of left artificial knee joint: Secondary | ICD-10-CM | POA: Diagnosis not present

## 2024-06-20 DIAGNOSIS — Z96652 Presence of left artificial knee joint: Secondary | ICD-10-CM | POA: Diagnosis not present

## 2024-06-20 DIAGNOSIS — M25662 Stiffness of left knee, not elsewhere classified: Secondary | ICD-10-CM | POA: Diagnosis not present

## 2024-06-20 DIAGNOSIS — R531 Weakness: Secondary | ICD-10-CM | POA: Diagnosis not present

## 2024-06-26 DIAGNOSIS — R531 Weakness: Secondary | ICD-10-CM | POA: Diagnosis not present

## 2024-06-26 DIAGNOSIS — Z96652 Presence of left artificial knee joint: Secondary | ICD-10-CM | POA: Diagnosis not present

## 2024-06-26 DIAGNOSIS — M25662 Stiffness of left knee, not elsewhere classified: Secondary | ICD-10-CM | POA: Diagnosis not present

## 2024-06-28 DIAGNOSIS — R531 Weakness: Secondary | ICD-10-CM | POA: Diagnosis not present

## 2024-06-28 DIAGNOSIS — Z96652 Presence of left artificial knee joint: Secondary | ICD-10-CM | POA: Diagnosis not present

## 2024-06-28 DIAGNOSIS — M25662 Stiffness of left knee, not elsewhere classified: Secondary | ICD-10-CM | POA: Diagnosis not present

## 2024-07-03 DIAGNOSIS — Z96652 Presence of left artificial knee joint: Secondary | ICD-10-CM | POA: Diagnosis not present

## 2024-07-03 DIAGNOSIS — M25662 Stiffness of left knee, not elsewhere classified: Secondary | ICD-10-CM | POA: Diagnosis not present

## 2024-07-03 DIAGNOSIS — R531 Weakness: Secondary | ICD-10-CM | POA: Diagnosis not present

## 2024-07-05 DIAGNOSIS — M25662 Stiffness of left knee, not elsewhere classified: Secondary | ICD-10-CM | POA: Diagnosis not present

## 2024-07-05 DIAGNOSIS — Z96652 Presence of left artificial knee joint: Secondary | ICD-10-CM | POA: Diagnosis not present

## 2024-07-05 DIAGNOSIS — R531 Weakness: Secondary | ICD-10-CM | POA: Diagnosis not present

## 2024-07-09 DIAGNOSIS — R531 Weakness: Secondary | ICD-10-CM | POA: Diagnosis not present

## 2024-07-09 DIAGNOSIS — M25662 Stiffness of left knee, not elsewhere classified: Secondary | ICD-10-CM | POA: Diagnosis not present

## 2024-07-09 DIAGNOSIS — Z96652 Presence of left artificial knee joint: Secondary | ICD-10-CM | POA: Diagnosis not present

## 2024-07-18 DIAGNOSIS — M25662 Stiffness of left knee, not elsewhere classified: Secondary | ICD-10-CM | POA: Diagnosis not present

## 2024-07-18 DIAGNOSIS — Z96652 Presence of left artificial knee joint: Secondary | ICD-10-CM | POA: Diagnosis not present

## 2024-07-18 DIAGNOSIS — R531 Weakness: Secondary | ICD-10-CM | POA: Diagnosis not present

## 2024-07-20 DIAGNOSIS — Z961 Presence of intraocular lens: Secondary | ICD-10-CM | POA: Diagnosis not present

## 2024-07-20 DIAGNOSIS — H2511 Age-related nuclear cataract, right eye: Secondary | ICD-10-CM | POA: Diagnosis not present

## 2024-09-19 ENCOUNTER — Ambulatory Visit: Payer: Medicare Other

## 2024-09-26 ENCOUNTER — Other Ambulatory Visit

## 2024-10-03 ENCOUNTER — Encounter: Admitting: Family Medicine

## 2024-11-29 ENCOUNTER — Ambulatory Visit
